# Patient Record
Sex: Female | Born: 1935 | Race: White | Hispanic: No | Smoking: Former smoker
Health system: Southern US, Community
[De-identification: ages and names within clinical notes are randomized; demographics above are authoritative.]

## PROBLEM LIST (undated history)

## (undated) DIAGNOSIS — E785 Hyperlipidemia, unspecified: Secondary | ICD-10-CM

## (undated) DIAGNOSIS — Z972 Presence of dental prosthetic device (complete) (partial): Secondary | ICD-10-CM

## (undated) DIAGNOSIS — I2721 Secondary pulmonary arterial hypertension: Secondary | ICD-10-CM

## (undated) DIAGNOSIS — E079 Disorder of thyroid, unspecified: Secondary | ICD-10-CM

## (undated) DIAGNOSIS — Z974 Presence of external hearing-aid: Secondary | ICD-10-CM

## (undated) DIAGNOSIS — I10 Essential (primary) hypertension: Secondary | ICD-10-CM

## (undated) DIAGNOSIS — K219 Gastro-esophageal reflux disease without esophagitis: Secondary | ICD-10-CM

## (undated) DIAGNOSIS — I5181 Takotsubo syndrome: Secondary | ICD-10-CM

## (undated) DIAGNOSIS — J449 Chronic obstructive pulmonary disease, unspecified: Secondary | ICD-10-CM

## (undated) DIAGNOSIS — K5792 Diverticulitis of intestine, part unspecified, without perforation or abscess without bleeding: Secondary | ICD-10-CM

## (undated) DIAGNOSIS — I509 Heart failure, unspecified: Secondary | ICD-10-CM

## (undated) HISTORY — PX: CERVICAL CONE BIOPSY: SUR198

## (undated) HISTORY — DX: Gastro-esophageal reflux disease without esophagitis: K21.9

## (undated) HISTORY — PX: CHOLECYSTECTOMY: SHX55

## (undated) HISTORY — DX: Secondary pulmonary arterial hypertension: I27.21

## (undated) HISTORY — DX: Disorder of thyroid, unspecified: E07.9

## (undated) HISTORY — DX: Takotsubo syndrome: I51.81

## (undated) HISTORY — PX: HEMORROIDECTOMY: SUR656

## (undated) HISTORY — DX: Diverticulitis of intestine, part unspecified, without perforation or abscess without bleeding: K57.92

## (undated) HISTORY — DX: Essential (primary) hypertension: I10

---

## 2011-02-11 LAB — HM COLONOSCOPY

## 2011-05-19 DIAGNOSIS — M549 Dorsalgia, unspecified: Secondary | ICD-10-CM | POA: Diagnosis not present

## 2011-05-19 DIAGNOSIS — R946 Abnormal results of thyroid function studies: Secondary | ICD-10-CM | POA: Diagnosis not present

## 2011-05-19 DIAGNOSIS — B0089 Other herpesviral infection: Secondary | ICD-10-CM | POA: Diagnosis not present

## 2011-05-19 DIAGNOSIS — R7301 Impaired fasting glucose: Secondary | ICD-10-CM | POA: Diagnosis not present

## 2011-05-19 DIAGNOSIS — Z6826 Body mass index (BMI) 26.0-26.9, adult: Secondary | ICD-10-CM | POA: Diagnosis not present

## 2011-11-09 DIAGNOSIS — R7301 Impaired fasting glucose: Secondary | ICD-10-CM | POA: Diagnosis not present

## 2011-11-09 DIAGNOSIS — E782 Mixed hyperlipidemia: Secondary | ICD-10-CM | POA: Diagnosis not present

## 2011-11-09 DIAGNOSIS — Z6825 Body mass index (BMI) 25.0-25.9, adult: Secondary | ICD-10-CM | POA: Diagnosis not present

## 2011-11-09 DIAGNOSIS — R030 Elevated blood-pressure reading, without diagnosis of hypertension: Secondary | ICD-10-CM | POA: Diagnosis not present

## 2011-11-23 DIAGNOSIS — H2510 Age-related nuclear cataract, unspecified eye: Secondary | ICD-10-CM | POA: Diagnosis not present

## 2011-11-23 DIAGNOSIS — H526 Other disorders of refraction: Secondary | ICD-10-CM | POA: Diagnosis not present

## 2011-11-23 DIAGNOSIS — L2089 Other atopic dermatitis: Secondary | ICD-10-CM | POA: Diagnosis not present

## 2011-12-31 DIAGNOSIS — Z6825 Body mass index (BMI) 25.0-25.9, adult: Secondary | ICD-10-CM | POA: Diagnosis not present

## 2011-12-31 DIAGNOSIS — R05 Cough: Secondary | ICD-10-CM | POA: Diagnosis not present

## 2011-12-31 DIAGNOSIS — J0100 Acute maxillary sinusitis, unspecified: Secondary | ICD-10-CM | POA: Diagnosis not present

## 2012-02-08 DIAGNOSIS — E782 Mixed hyperlipidemia: Secondary | ICD-10-CM | POA: Diagnosis not present

## 2012-02-08 DIAGNOSIS — Z79899 Other long term (current) drug therapy: Secondary | ICD-10-CM | POA: Diagnosis not present

## 2012-02-08 DIAGNOSIS — M25569 Pain in unspecified knee: Secondary | ICD-10-CM | POA: Diagnosis not present

## 2012-02-08 DIAGNOSIS — R7301 Impaired fasting glucose: Secondary | ICD-10-CM | POA: Diagnosis not present

## 2012-02-08 DIAGNOSIS — R946 Abnormal results of thyroid function studies: Secondary | ICD-10-CM | POA: Diagnosis not present

## 2012-02-08 DIAGNOSIS — Z23 Encounter for immunization: Secondary | ICD-10-CM | POA: Diagnosis not present

## 2012-02-08 DIAGNOSIS — Z6826 Body mass index (BMI) 26.0-26.9, adult: Secondary | ICD-10-CM | POA: Diagnosis not present

## 2012-05-31 DIAGNOSIS — R7301 Impaired fasting glucose: Secondary | ICD-10-CM | POA: Diagnosis not present

## 2012-05-31 DIAGNOSIS — K219 Gastro-esophageal reflux disease without esophagitis: Secondary | ICD-10-CM | POA: Diagnosis not present

## 2012-06-19 DIAGNOSIS — I4892 Unspecified atrial flutter: Secondary | ICD-10-CM | POA: Diagnosis not present

## 2012-08-30 DIAGNOSIS — E782 Mixed hyperlipidemia: Secondary | ICD-10-CM | POA: Diagnosis not present

## 2012-08-30 DIAGNOSIS — H04129 Dry eye syndrome of unspecified lacrimal gland: Secondary | ICD-10-CM | POA: Diagnosis not present

## 2012-08-30 DIAGNOSIS — IMO0002 Reserved for concepts with insufficient information to code with codable children: Secondary | ICD-10-CM | POA: Diagnosis not present

## 2012-08-30 DIAGNOSIS — R7301 Impaired fasting glucose: Secondary | ICD-10-CM | POA: Diagnosis not present

## 2012-08-30 DIAGNOSIS — Z79899 Other long term (current) drug therapy: Secondary | ICD-10-CM | POA: Diagnosis not present

## 2012-08-30 DIAGNOSIS — H526 Other disorders of refraction: Secondary | ICD-10-CM | POA: Diagnosis not present

## 2012-08-30 DIAGNOSIS — Z0000 Encounter for general adult medical examination without abnormal findings: Secondary | ICD-10-CM | POA: Diagnosis not present

## 2012-08-30 DIAGNOSIS — M810 Age-related osteoporosis without current pathological fracture: Secondary | ICD-10-CM | POA: Diagnosis not present

## 2012-08-30 DIAGNOSIS — H2510 Age-related nuclear cataract, unspecified eye: Secondary | ICD-10-CM | POA: Diagnosis not present

## 2012-11-08 DIAGNOSIS — IMO0001 Reserved for inherently not codable concepts without codable children: Secondary | ICD-10-CM | POA: Diagnosis not present

## 2013-02-26 ENCOUNTER — Ambulatory Visit: Payer: Self-pay | Admitting: Family Medicine

## 2013-02-26 DIAGNOSIS — M1710 Unilateral primary osteoarthritis, unspecified knee: Secondary | ICD-10-CM | POA: Diagnosis not present

## 2013-02-26 DIAGNOSIS — E7800 Pure hypercholesterolemia, unspecified: Secondary | ICD-10-CM | POA: Diagnosis not present

## 2013-02-26 DIAGNOSIS — Z1331 Encounter for screening for depression: Secondary | ICD-10-CM | POA: Diagnosis not present

## 2013-02-26 DIAGNOSIS — Z23 Encounter for immunization: Secondary | ICD-10-CM | POA: Diagnosis not present

## 2013-02-26 DIAGNOSIS — G589 Mononeuropathy, unspecified: Secondary | ICD-10-CM | POA: Diagnosis not present

## 2013-02-26 DIAGNOSIS — Z1330 Encounter for screening examination for mental health and behavioral disorders, unspecified: Secondary | ICD-10-CM | POA: Diagnosis not present

## 2013-02-26 DIAGNOSIS — M2010 Hallux valgus (acquired), unspecified foot: Secondary | ICD-10-CM | POA: Diagnosis not present

## 2013-02-26 DIAGNOSIS — M19079 Primary osteoarthritis, unspecified ankle and foot: Secondary | ICD-10-CM | POA: Diagnosis not present

## 2013-02-26 DIAGNOSIS — R7309 Other abnormal glucose: Secondary | ICD-10-CM | POA: Diagnosis not present

## 2013-02-26 DIAGNOSIS — E559 Vitamin D deficiency, unspecified: Secondary | ICD-10-CM | POA: Diagnosis not present

## 2013-02-26 DIAGNOSIS — M79609 Pain in unspecified limb: Secondary | ICD-10-CM | POA: Diagnosis not present

## 2013-04-11 DIAGNOSIS — K5792 Diverticulitis of intestine, part unspecified, without perforation or abscess without bleeding: Secondary | ICD-10-CM

## 2013-04-11 HISTORY — DX: Diverticulitis of intestine, part unspecified, without perforation or abscess without bleeding: K57.92

## 2013-04-25 DIAGNOSIS — S239XXA Sprain of unspecified parts of thorax, initial encounter: Secondary | ICD-10-CM | POA: Diagnosis not present

## 2013-04-25 DIAGNOSIS — Z23 Encounter for immunization: Secondary | ICD-10-CM | POA: Diagnosis not present

## 2013-04-25 DIAGNOSIS — Z1331 Encounter for screening for depression: Secondary | ICD-10-CM | POA: Diagnosis not present

## 2013-04-25 DIAGNOSIS — Z1342 Encounter for screening for global developmental delays (milestones): Secondary | ICD-10-CM | POA: Diagnosis not present

## 2013-04-25 DIAGNOSIS — E7800 Pure hypercholesterolemia, unspecified: Secondary | ICD-10-CM | POA: Diagnosis not present

## 2013-04-25 DIAGNOSIS — R7309 Other abnormal glucose: Secondary | ICD-10-CM | POA: Diagnosis not present

## 2013-04-25 DIAGNOSIS — E559 Vitamin D deficiency, unspecified: Secondary | ICD-10-CM | POA: Diagnosis not present

## 2013-04-25 DIAGNOSIS — Z1330 Encounter for screening examination for mental health and behavioral disorders, unspecified: Secondary | ICD-10-CM | POA: Diagnosis not present

## 2013-06-24 DIAGNOSIS — Z1342 Encounter for screening for global developmental delays (milestones): Secondary | ICD-10-CM | POA: Diagnosis not present

## 2013-06-24 DIAGNOSIS — R5383 Other fatigue: Secondary | ICD-10-CM | POA: Diagnosis not present

## 2013-06-24 DIAGNOSIS — E559 Vitamin D deficiency, unspecified: Secondary | ICD-10-CM | POA: Diagnosis not present

## 2013-06-24 DIAGNOSIS — Z23 Encounter for immunization: Secondary | ICD-10-CM | POA: Diagnosis not present

## 2013-06-24 DIAGNOSIS — Z1330 Encounter for screening examination for mental health and behavioral disorders, unspecified: Secondary | ICD-10-CM | POA: Diagnosis not present

## 2013-06-24 DIAGNOSIS — R7309 Other abnormal glucose: Secondary | ICD-10-CM | POA: Diagnosis not present

## 2013-06-24 DIAGNOSIS — Z1331 Encounter for screening for depression: Secondary | ICD-10-CM | POA: Diagnosis not present

## 2013-06-24 DIAGNOSIS — R5381 Other malaise: Secondary | ICD-10-CM | POA: Diagnosis not present

## 2013-06-24 DIAGNOSIS — E7800 Pure hypercholesterolemia, unspecified: Secondary | ICD-10-CM | POA: Diagnosis not present

## 2013-06-24 LAB — TSH: TSH: 3.35 u[IU]/mL (ref <=5.90)

## 2013-07-01 DIAGNOSIS — H2510 Age-related nuclear cataract, unspecified eye: Secondary | ICD-10-CM | POA: Diagnosis not present

## 2013-10-21 DIAGNOSIS — R7309 Other abnormal glucose: Secondary | ICD-10-CM | POA: Diagnosis not present

## 2013-10-21 DIAGNOSIS — Z1342 Encounter for screening for global developmental delays (milestones): Secondary | ICD-10-CM | POA: Diagnosis not present

## 2013-10-21 DIAGNOSIS — Z23 Encounter for immunization: Secondary | ICD-10-CM | POA: Diagnosis not present

## 2013-10-21 DIAGNOSIS — E7800 Pure hypercholesterolemia, unspecified: Secondary | ICD-10-CM | POA: Diagnosis not present

## 2013-10-21 DIAGNOSIS — Z1330 Encounter for screening examination for mental health and behavioral disorders, unspecified: Secondary | ICD-10-CM | POA: Diagnosis not present

## 2013-10-21 DIAGNOSIS — G589 Mononeuropathy, unspecified: Secondary | ICD-10-CM | POA: Diagnosis not present

## 2013-10-21 DIAGNOSIS — K2100 Gastro-esophageal reflux disease with esophagitis, without bleeding: Secondary | ICD-10-CM | POA: Diagnosis not present

## 2013-10-21 DIAGNOSIS — Z1331 Encounter for screening for depression: Secondary | ICD-10-CM | POA: Diagnosis not present

## 2013-10-21 DIAGNOSIS — M129 Arthropathy, unspecified: Secondary | ICD-10-CM | POA: Diagnosis not present

## 2013-10-21 LAB — LIPID PANEL
Cholesterol: 181 mg/dL (ref 0–200)
HDL: 96 mg/dL — AB (ref 35–70)
LDL CALC: 68 mg/dL
TRIGLYCERIDES: 87 mg/dL (ref 40–160)

## 2013-10-21 LAB — HEPATIC FUNCTION PANEL
ALT: 25 U/L (ref 7–35)
AST: 28 U/L (ref 13–35)
Alkaline Phosphatase: 69 U/L (ref 25–125)
Bilirubin, Total: 0.7 mg/dL

## 2013-10-21 LAB — HEMOGLOBIN A1C: HEMOGLOBIN A1C: 5.9 % (ref 4.0–6.0)

## 2013-10-21 LAB — BASIC METABOLIC PANEL
BUN: 8 mg/dL (ref 4–21)
Creatinine: 0.6 mg/dL (ref 0.5–1.1)
Glucose: 113 mg/dL
Potassium: 5.1 mmol/L (ref 3.4–5.3)
SODIUM: 141 mmol/L (ref 137–147)

## 2013-11-13 ENCOUNTER — Ambulatory Visit (INDEPENDENT_AMBULATORY_CARE_PROVIDER_SITE_OTHER): Payer: Medicare Other

## 2013-11-13 ENCOUNTER — Ambulatory Visit (INDEPENDENT_AMBULATORY_CARE_PROVIDER_SITE_OTHER): Payer: Medicare Other | Admitting: Podiatry

## 2013-11-13 ENCOUNTER — Encounter: Payer: Self-pay | Admitting: Podiatry

## 2013-11-13 VITALS — BP 133/72 | HR 84 | Resp 16 | Ht 64.0 in | Wt 135.0 lb

## 2013-11-13 DIAGNOSIS — M21611 Bunion of right foot: Secondary | ICD-10-CM

## 2013-11-13 DIAGNOSIS — M21619 Bunion of unspecified foot: Secondary | ICD-10-CM

## 2013-11-13 DIAGNOSIS — M19079 Primary osteoarthritis, unspecified ankle and foot: Secondary | ICD-10-CM

## 2013-11-13 NOTE — Progress Notes (Signed)
   Subjective:    Patient ID: Mary Howe, female    DOB: Aug 30, 1932, 80 y.o.   MRN: [id number]  HPI Comments: i have foot pain in both of my feet in the arches. They have been hurting for a couple of years off and on. They are getting worse at times. It hurts to walk. i wake up at night and they hurt. i have gotten new shoes and otc inserts. i take motrin as well.  Foot Pain      Review of Systems  HENT: Positive for hearing loss.   Musculoskeletal:       Joint pain  Hematological: Bruises/bleeds easily.  All other systems reviewed and are negative.      Objective:   Physical Exam: I have reviewed her past medical history medications allergies surgeries social history and review of systems. Pulses are strongly palpable bilateral. Neurologic sensorium is intact bilateral. Deep tendon reflexes are brisk and equal bilateral. Muscle strength is 5 over 5 dorsiflexors plantar flexors inverters everters all musculature is intact. Orthopedic evaluation should all joints distal to the ankle a full range of motion without crepitation. 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 They're asymptomatic. She has pain on palpation and frontal plane range of motion of the midfoot in the arch. Radiographic evaluation demonstrates severe hallux valgus deformities bilateral hammertoe deformities bilateral but also osteoarthritis of the midfoot. With osteopenia.        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 HAV deformities bilateral.  Plan: Discussed etiology pathology conservative or surgical therapies. We discussed appropriate inserts and appropriate shoes. I will followup with her as needed. We also discussed the need for meloxicam.

## 2014-01-14 DIAGNOSIS — K5792 Diverticulitis of intestine, part unspecified, without perforation or abscess without bleeding: Secondary | ICD-10-CM | POA: Diagnosis not present

## 2014-01-14 DIAGNOSIS — Z6823 Body mass index (BMI) 23.0-23.9, adult: Secondary | ICD-10-CM | POA: Diagnosis not present

## 2014-01-14 DIAGNOSIS — R1030 Lower abdominal pain, unspecified: Secondary | ICD-10-CM | POA: Diagnosis not present

## 2014-01-14 DIAGNOSIS — R8299 Other abnormal findings in urine: Secondary | ICD-10-CM | POA: Diagnosis not present

## 2014-01-14 DIAGNOSIS — E782 Mixed hyperlipidemia: Secondary | ICD-10-CM | POA: Diagnosis not present

## 2014-01-14 DIAGNOSIS — G8929 Other chronic pain: Secondary | ICD-10-CM | POA: Diagnosis not present

## 2014-01-14 DIAGNOSIS — M25561 Pain in right knee: Secondary | ICD-10-CM | POA: Diagnosis not present

## 2014-01-14 DIAGNOSIS — M542 Cervicalgia: Secondary | ICD-10-CM | POA: Diagnosis not present

## 2014-01-14 LAB — LIPID PANEL
Cholesterol: 165 mg/dL (ref 0–200)
HDL: 92 mg/dL — AB (ref 35–70)
LDL CALC: 59 mg/dL
Triglycerides: 72 mg/dL (ref 40–160)

## 2014-01-14 LAB — BASIC METABOLIC PANEL
BUN: 7 mg/dL (ref 4–21)
Creatinine: 0.5 mg/dL (ref 0.5–1.1)
GLUCOSE: 102 mg/dL
POTASSIUM: 4.3 mmol/L (ref 3.4–5.3)
SODIUM: 140 mmol/L (ref 137–147)

## 2014-01-14 LAB — HEPATIC FUNCTION PANEL
ALT: 20 U/L (ref 7–35)
AST: 23 U/L (ref 13–35)
Alkaline Phosphatase: 83 U/L (ref 25–125)
BILIRUBIN, TOTAL: 0.7 mg/dL

## 2014-01-14 LAB — TSH: TSH: 2.93 u[IU]/mL (ref 0.41–5.90)

## 2014-01-20 DIAGNOSIS — K5900 Constipation, unspecified: Secondary | ICD-10-CM | POA: Diagnosis not present

## 2014-01-20 DIAGNOSIS — K5730 Diverticulosis of large intestine without perforation or abscess without bleeding: Secondary | ICD-10-CM | POA: Diagnosis not present

## 2014-01-20 DIAGNOSIS — Z6823 Body mass index (BMI) 23.0-23.9, adult: Secondary | ICD-10-CM | POA: Diagnosis not present

## 2014-01-20 DIAGNOSIS — K5792 Diverticulitis of intestine, part unspecified, without perforation or abscess without bleeding: Secondary | ICD-10-CM | POA: Diagnosis not present

## 2014-01-20 DIAGNOSIS — R1030 Lower abdominal pain, unspecified: Secondary | ICD-10-CM | POA: Diagnosis not present

## 2014-01-20 DIAGNOSIS — Z23 Encounter for immunization: Secondary | ICD-10-CM | POA: Diagnosis not present

## 2014-06-02 DIAGNOSIS — H6692 Otitis media, unspecified, left ear: Secondary | ICD-10-CM | POA: Diagnosis not present

## 2014-06-02 DIAGNOSIS — R112 Nausea with vomiting, unspecified: Secondary | ICD-10-CM | POA: Diagnosis not present

## 2014-06-16 DIAGNOSIS — H6983 Other specified disorders of Eustachian tube, bilateral: Secondary | ICD-10-CM | POA: Diagnosis not present

## 2014-06-16 DIAGNOSIS — H9193 Unspecified hearing loss, bilateral: Secondary | ICD-10-CM | POA: Diagnosis not present

## 2014-06-16 DIAGNOSIS — M2669 Other specified disorders of temporomandibular joint: Secondary | ICD-10-CM | POA: Diagnosis not present

## 2014-07-29 DIAGNOSIS — M25732 Osteophyte, left wrist: Secondary | ICD-10-CM | POA: Diagnosis not present

## 2014-07-29 DIAGNOSIS — H9193 Unspecified hearing loss, bilateral: Secondary | ICD-10-CM | POA: Diagnosis not present

## 2014-07-29 DIAGNOSIS — H6983 Other specified disorders of Eustachian tube, bilateral: Secondary | ICD-10-CM | POA: Diagnosis not present

## 2014-08-05 ENCOUNTER — Ambulatory Visit
Admit: 2014-08-05 | Disposition: A | Discharge status: Home / Self Care | Payer: Self-pay | Attending: Family Medicine | Admitting: Family Medicine

## 2014-08-05 DIAGNOSIS — M25432 Effusion, left wrist: Secondary | ICD-10-CM | POA: Diagnosis not present

## 2014-08-05 DIAGNOSIS — H6983 Other specified disorders of Eustachian tube, bilateral: Secondary | ICD-10-CM | POA: Diagnosis not present

## 2014-08-05 DIAGNOSIS — H9193 Unspecified hearing loss, bilateral: Secondary | ICD-10-CM | POA: Diagnosis not present

## 2014-08-05 DIAGNOSIS — M19032 Primary osteoarthritis, left wrist: Secondary | ICD-10-CM | POA: Diagnosis not present

## 2014-08-19 DIAGNOSIS — M778 Other enthesopathies, not elsewhere classified: Secondary | ICD-10-CM | POA: Diagnosis not present

## 2014-09-17 DIAGNOSIS — M778 Other enthesopathies, not elsewhere classified: Secondary | ICD-10-CM | POA: Diagnosis not present

## 2014-10-01 DIAGNOSIS — H903 Sensorineural hearing loss, bilateral: Secondary | ICD-10-CM | POA: Diagnosis not present

## 2014-10-01 DIAGNOSIS — H60332 Swimmer's ear, left ear: Secondary | ICD-10-CM | POA: Diagnosis not present

## 2014-10-01 DIAGNOSIS — H60549 Acute eczematoid otitis externa, unspecified ear: Secondary | ICD-10-CM | POA: Diagnosis not present

## 2014-10-01 DIAGNOSIS — H6063 Unspecified chronic otitis externa, bilateral: Secondary | ICD-10-CM | POA: Diagnosis not present

## 2014-10-01 DIAGNOSIS — H6123 Impacted cerumen, bilateral: Secondary | ICD-10-CM | POA: Diagnosis not present

## 2014-10-15 DIAGNOSIS — H6122 Impacted cerumen, left ear: Secondary | ICD-10-CM | POA: Diagnosis not present

## 2014-10-15 DIAGNOSIS — H60392 Other infective otitis externa, left ear: Secondary | ICD-10-CM | POA: Diagnosis not present

## 2014-10-30 DIAGNOSIS — H903 Sensorineural hearing loss, bilateral: Secondary | ICD-10-CM | POA: Diagnosis not present

## 2014-11-06 DIAGNOSIS — H2513 Age-related nuclear cataract, bilateral: Secondary | ICD-10-CM | POA: Diagnosis not present

## 2014-12-04 ENCOUNTER — Other Ambulatory Visit: Payer: Self-pay | Admitting: Family Medicine

## 2014-12-04 DIAGNOSIS — Z1231 Encounter for screening mammogram for malignant neoplasm of breast: Secondary | ICD-10-CM

## 2014-12-09 ENCOUNTER — Other Ambulatory Visit: Payer: Self-pay | Admitting: Family Medicine

## 2014-12-09 ENCOUNTER — Ambulatory Visit
Admission: RE | Admit: 2014-12-09 | Discharge: 2014-12-09 | Disposition: A | Discharge status: Home / Self Care | Payer: Medicare Other | Source: Ambulatory Visit | Attending: Family Medicine | Admitting: Family Medicine

## 2014-12-09 DIAGNOSIS — Z1231 Encounter for screening mammogram for malignant neoplasm of breast: Secondary | ICD-10-CM | POA: Diagnosis not present

## 2015-01-27 ENCOUNTER — Other Ambulatory Visit: Payer: Self-pay | Admitting: Family Medicine

## 2015-02-19 DIAGNOSIS — E7800 Pure hypercholesterolemia, unspecified: Secondary | ICD-10-CM | POA: Insufficient documentation

## 2015-02-19 DIAGNOSIS — R7303 Prediabetes: Secondary | ICD-10-CM | POA: Insufficient documentation

## 2015-02-19 DIAGNOSIS — E0500 Thyrotoxicosis with diffuse goiter without thyrotoxic crisis or storm: Secondary | ICD-10-CM | POA: Insufficient documentation

## 2015-02-19 DIAGNOSIS — M26629 Arthralgia of temporomandibular joint, unspecified side: Secondary | ICD-10-CM | POA: Insufficient documentation

## 2015-02-19 DIAGNOSIS — K219 Gastro-esophageal reflux disease without esophagitis: Secondary | ICD-10-CM | POA: Insufficient documentation

## 2015-02-19 DIAGNOSIS — I878 Other specified disorders of veins: Secondary | ICD-10-CM | POA: Insufficient documentation

## 2015-02-19 DIAGNOSIS — G629 Polyneuropathy, unspecified: Secondary | ICD-10-CM | POA: Insufficient documentation

## 2015-02-19 DIAGNOSIS — I8390 Asymptomatic varicose veins of unspecified lower extremity: Secondary | ICD-10-CM | POA: Insufficient documentation

## 2015-02-19 DIAGNOSIS — K5790 Diverticulosis of intestine, part unspecified, without perforation or abscess without bleeding: Secondary | ICD-10-CM | POA: Insufficient documentation

## 2015-02-19 DIAGNOSIS — E559 Vitamin D deficiency, unspecified: Secondary | ICD-10-CM | POA: Insufficient documentation

## 2015-02-23 ENCOUNTER — Encounter: Payer: Self-pay | Admitting: Family Medicine

## 2015-02-23 ENCOUNTER — Ambulatory Visit (INDEPENDENT_AMBULATORY_CARE_PROVIDER_SITE_OTHER): Payer: Medicare Other | Admitting: Family Medicine

## 2015-02-23 VITALS — BP 162/78 | HR 56 | Temp 97.6°F | Resp 16 | Ht 64.5 in | Wt 137.0 lb

## 2015-02-23 DIAGNOSIS — IMO0001 Reserved for inherently not codable concepts without codable children: Secondary | ICD-10-CM

## 2015-02-23 DIAGNOSIS — Z23 Encounter for immunization: Secondary | ICD-10-CM

## 2015-02-23 DIAGNOSIS — Z0000 Encounter for general adult medical examination without abnormal findings: Secondary | ICD-10-CM | POA: Diagnosis not present

## 2015-02-23 DIAGNOSIS — R030 Elevated blood-pressure reading, without diagnosis of hypertension: Secondary | ICD-10-CM

## 2015-02-23 LAB — POCT URINALYSIS DIPSTICK
Bilirubin, UA: NEGATIVE
Blood, UA: NEGATIVE
GLUCOSE UA: NEGATIVE
KETONES UA: NEGATIVE
Leukocytes, UA: NEGATIVE
Nitrite, UA: NEGATIVE
PH UA: 6.5
Protein, UA: NEGATIVE
Spec Grav, UA: <=1.005
Urobilinogen, UA: 0.2

## 2015-02-23 NOTE — Progress Notes (Signed)
Patient ID: Juliett Stjohn, female   DOB: 1932-12-22, 81 y.o.   MRN: [id number]       Patient: Mary Howe, Female    DOB: November 18, 1932, 81 y.o.   MRN: [id number] Visit Date: 02/23/2015  Today's Provider: Vernie Murders, PA   Chief Complaint  Patient presents with  . Annual Exam   Subjective:    Annual physical exam Mary Howe is a 81 y.o. female who presents today for health maintenance and complete physical. She feels well. She reports exercising 4 times a week. She reports she is sleeping well (7-8 hours a night).  -----------------------------------------------------------------   Review of Systems  Constitutional: Negative.   HENT: Negative.   Eyes: Negative.   Respiratory: Negative.   Cardiovascular: Negative.   Gastrointestinal: Negative.   Endocrine: Negative.   Genitourinary: Negative.   Musculoskeletal: Negative.   Skin: Negative.   Allergic/Immunologic: Negative.   Neurological: Negative.   Hematological: Negative.   Psychiatric/Behavioral: Negative.     Social History She  reports that she has quit smoking. She has never used smokeless tobacco. She reports that she drinks alcohol. She reports that she does not use illicit drugs. Social History   Social History  . Marital Status: Divorced    Spouse Name: N/A  . Number of Children: N/A  . Years of Education: N/A   Social History Main Topics  . Smoking status: Former Research scientist (life sciences)  . Smokeless tobacco: Never Used  . Alcohol Use: 0.0 oz/week    0 Standard drinks or equivalent per week     Comment: OCCASIONALLY DRINKS WINE  . Drug Use: No  . Sexual Activity: Not Asked   Other Topics Concern  . None   Social History Narrative   ** Merged History Encounter **        Patient Active Problem List   Diagnosis Date Noted  . DD (diverticular disease) 02/19/2015  . Acid reflux 02/19/2015  . 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 Hypercholesterolemia 02/19/2015  . Neuropathy ([place]) 02/19/2015  . 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 Stasis, venous 02/19/2015  . Avitaminosis D 02/19/2015    Past Surgical History  Procedure Laterality Date  . Cholecystectomy    . Hemorroidectomy    . Cervical cone biopsy      Family History  Family Status  Relation Status Death Age  . Mother Deceased 59  . Father Deceased 56  . Sister    . Brother Deceased 26  . Brother Deceased 15'S  . Brother Deceased   . Brother Deceased   . Brother Deceased 77  . Sister Deceased 73  . Sister Deceased 41  . Sister Alive    Her family history includes Aneurysm in her sister; Bone cancer in her sister; Brain cancer in her brother; CAD in her brother and brother; Emphysema in her sister; Fibrocystic breast disease in her sister; Heart attack in her brother; Heart disease in her brother and sister; Lung disease in her brother; Pneumonia in her brother; Prostate cancer in her father.    No Known Allergies  Previous Medications   CALCIUM CARBONATE (OS-CAL) 600 MG TABS TABLET    Take by mouth.   IBUPROFEN (ADVIL,MOTRIN) 800 MG TABLET    TAKE ONE TABLET BY MOUTH TWICE DAILY AS NEEDED   MELOXICAM (MOBIC) 7.5 MG TABLET    Take 7.5 mg by mouth as needed for pain.   MULTIPLE VITAMIN PO    Take by mouth.  PANTOPRAZOLE (PROTONIX) 40 MG TABLET    TAKE ONE TABLET BY MOUTH ONE TIME DAILY   SIMVASTATIN (ZOCOR) 40 MG TABLET    TAKE ONE TABLET BY MOUTH NIGHTLY AT BEDTIME    Patient Care Team: Jerrol Banana., MD as PCP - General (Family Medicine)     Objective:   Vitals: BP 162/78 mmHg  Pulse 56  Temp(Src) 97.6 F (36.4 C) (Oral)  Resp 16  Ht 5' 4.5" (1.638 m)  Wt 137 lb (62.143 kg)  BMI 23.16 kg/m2  SpO2 93%  BP Readings from Last 3 Encounters:  02/23/15 162/78  08/05/14 120/74  11/13/13 133/72    Physical Exam  Constitutional: She is oriented to person, place, and time. She appears well-developed and  well-nourished.  HENT:  Head: Normocephalic and atraumatic.  Hearing loss compensated by hearing aids bilaterally.  Eyes: Conjunctivae and EOM are normal. Pupils are equal, round, and reactive to light.  Neck: Normal range of motion. Neck supple.  Cardiovascular: Normal rate, regular rhythm, normal heart sounds and intact distal pulses.   Many varicose veins in lower legs and feet.  Pulmonary/Chest: Effort normal and breath sounds normal.  Abdominal: Soft. Bowel sounds are normal.  Musculoskeletal:  Good ROM throughout. Enlarged first carpo-metacarpal joint of each hand (right worse than the left). Good grip strength. Lateral deviation of both great toes with bunions.  Neurological: She is alert and oriented to person, place, and time.  2+ DTR's in upper extremities and left knee. Unable to elicit in the right knee ("never have").  Skin: Skin is warm and dry. No rash noted.  Psychiatric: She has a normal mood and affect. Her behavior is normal. Thought content normal.   Depression Screen Bright spirits without anxiety or depressive symptoms. PHQ-2 score 0.   Assessment & Plan:     Routine Health Maintenance and Physical Exam  Exercise Activities and Dietary recommendations Goals    Continues water aerobics twice a week and walking 4 times a week. Recommend restricting sodium and caffeine intake.      Immunization History  Administered Date(s) Administered  . Pneumococcal Conjugate-13 10/21/2013    Health Maintenance  Topic Date Due  . TETANUS/TDAP  09/08/1953  . ZOSTAVAX  09/09/1994  . DEXA SCAN  09/09/1999  . PNA vac Low Risk Adult (2 of 2 - PPSV23) 10/22/2014  . INFLUENZA VACCINE  11/10/2014      Discussed health benefits of physical activity, and encouraged her to engage in regular exercise appropriate for her age and condition.    -------------------------------------------------------------------- 1. Annual physical exam General health stable with bright  spirits. Exercises regularly in water aerobics. Very active. Has some varicose veins, bunions both feet and arthritic joints of both wrists. Consumes 1-2 glasses of wine 2-3 times a week. No balance issues and no history of falls in the past year. Wears bilateral hearing aids and able to converse with complete understanding at a normal tone. Tolerating medications for GERD (Protonix) and hyperlipidemia (Simvastatin) without side effects. Brought lab reports from 01-14-14 done in [place]. All tests were normal and very good lipid panel. Will schedule appointment for follow up of these conditions separate form this Medicare Health Screening. Unsure about last tetanus booster but found out it is not covered by her insurance today and declines vaccine. Had PCV-223  In [place] at age 27 (per patient) before coming to [facility]. PCV-13 was given 10-21-13 and last colonoscopy was done 2012 with history of diverticulosis. Mammograms were normal 12-09-14. Orthopedist  is Dr. Noemi Chapel and podiatrist is Dr. Milinda Pointer. - POCT urinalysis dipstick  2. Need for influenza vaccination Given today. - Flu vaccine HIGH DOSE PF  3. Elevated BP Increase in systolic pressure today. No history of hypertension in the past. Recommend she limit sodium intake and recheck BP in a month.

## 2015-02-26 ENCOUNTER — Ambulatory Visit (INDEPENDENT_AMBULATORY_CARE_PROVIDER_SITE_OTHER): Payer: Medicare Other | Admitting: Family Medicine

## 2015-02-26 ENCOUNTER — Encounter: Payer: Self-pay | Admitting: Family Medicine

## 2015-02-26 VITALS — BP 138/74 | HR 113 | Temp 99.8°F | Resp 18 | Wt 136.2 lb

## 2015-02-26 DIAGNOSIS — J40 Bronchitis, not specified as acute or chronic: Secondary | ICD-10-CM | POA: Diagnosis not present

## 2015-02-26 DIAGNOSIS — J029 Acute pharyngitis, unspecified: Secondary | ICD-10-CM

## 2015-02-26 MED ORDER — BENZONATATE 200 MG PO CAPS: 200.0000 mg | ORAL_CAPSULE | Freq: Two times a day (BID) | ORAL | Status: DC | PRN

## 2015-02-26 MED ORDER — AMOXICILLIN 875 MG PO TABS: 875.0000 mg | ORAL_TABLET | Freq: Two times a day (BID) | ORAL | Status: DC

## 2015-02-26 MED ORDER — FIRST-DUKES MOUTHWASH MT SUSP: 5.0000 mL | Freq: Three times a day (TID) | OROMUCOSAL | Status: DC

## 2015-02-26 NOTE — Progress Notes (Signed)
Patient ID: Aresha Remer, female   DOB: [birth year], 81 y.o.   MRN: [id number] Name: Mary Howe   MRN: [id number]    DOB: [birth year]   Date:02/26/2015       Progress Note  Subjective  Chief Complaint  Chief Complaint  Patient presents with  . URI    URI  This is a new problem. Episode onset: the past 3 days. The problem has been gradually worsening. Associated symptoms include congestion, coughing, headaches and a sore throat. Pertinent negatives include no ear pain. Associated symptoms comments: Sore throat worse this morning. Low grade temperature at home. Worried about having bronchitis again - "usually occurs after a cold starts".. Treatments tried: guaifenesin. The treatment provided no relief.   Patient Active Problem List   Diagnosis Date Noted  . DD (diverticular disease) 02/19/2015  . Acid reflux 02/19/2015  . 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 Hypercholesterolemia 02/19/2015  . Neuropathy ([place]) 02/19/2015  . Chemical diabetes ([place]) 02/19/2015  . 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 Stasis, venous 02/19/2015  . Avitaminosis D 02/19/2015   Past Surgical History  Procedure Laterality Date  . Cholecystectomy    . Hemorroidectomy    . Cervical cone biopsy     Family History  Problem Relation Age of Onset  . Prostate cancer Father   . Pneumonia Brother   . Lung disease Brother   . CAD Brother   . Heart disease Brother   . CAD Brother   . Heart attack Brother   . Brain cancer Brother   . Bone cancer Sister   . Emphysema Sister   . Fibrocystic breast disease Sister   . Aneurysm Sister   . Heart disease Sister    Social History  Substance Use Topics  . Smoking status: Former Research scientist (life sciences)  . Smokeless tobacco: Never Used  . Alcohol Use: 0.0 oz/week    0 Standard drinks or equivalent per week     Comment: OCCASIONALLY DRINKS WINE    Current outpatient prescriptions:  .  calcium carbonate (OS-CAL) 600 MG TABS  tablet, Take by mouth., Disp: , Rfl:  .  ibuprofen (ADVIL,MOTRIN) 800 MG tablet, TAKE ONE TABLET BY MOUTH TWICE DAILY AS NEEDED, Disp: 180 tablet, Rfl: 0 .  meloxicam (MOBIC) 7.5 MG tablet, Take 7.5 mg by mouth as needed for pain., Disp: , Rfl:  .  MULTIPLE VITAMIN PO, Take by mouth., Disp: , Rfl:  .  pantoprazole (PROTONIX) 40 MG tablet, TAKE ONE TABLET BY MOUTH ONE TIME DAILY, Disp: 90 tablet, Rfl: 0 .  simvastatin (ZOCOR) 40 MG tablet, TAKE ONE TABLET BY MOUTH NIGHTLY AT BEDTIME, Disp: 90 tablet, Rfl: 0  No Known Allergies  Review of Systems  Constitutional: Negative.   HENT: Positive for congestion and sore throat. Negative for ear pain.   Eyes: Negative.   Respiratory: Positive for cough.   Cardiovascular: Negative.   Gastrointestinal: Negative.   Genitourinary: Negative.   Musculoskeletal: Negative.   Skin: Negative.   Neurological: Positive for headaches.  Endo/Heme/Allergies: Negative.   Psychiatric/Behavioral: Negative.    Objective  Filed Vitals:   02/26/15 1058  BP: 138/74  Pulse: 113  Temp: 99.8 F (37.7 C)  TempSrc: Oral  Resp: 18  Weight: 136 lb 3.2 oz (61.78 kg)  SpO2: 94%   Physical Exam  Constitutional: She is oriented to person, place, and time and well-developed, well-nourished, and in no distress.  HENT:  Head: Normocephalic and atraumatic.  Hearing loss requiring bilateral hearing aids. TM's  not red and no drainage. Slightly red and cobblestone appearance to posterior pharynx. No sinus tenderness.  Eyes: Conjunctivae and EOM are normal.  Neck: Normal range of motion. Neck supple.  Cardiovascular: Regular rhythm.   Slightly tachycardic.  Pulmonary/Chest: Effort normal and breath sounds normal.  Abdominal: Soft. Bowel sounds are normal.  Lymphadenopathy:    She has no cervical adenopathy.  Neurological: She is alert and oriented to person, place, and time.  Skin: No rash noted.  Psychiatric: Affect and judgment normal.    Recent Results (from  the past 2160 hour(s))  POCT urinalysis dipstick     Status: None   Collection Time: 02/23/15  9:15 AM  Result Value Ref Range   Color, UA pale yellow    Clarity, UA clear    Glucose, UA neg    Bilirubin, UA neg    Ketones, UA neg    Spec Grav, UA <=1.005    Blood, UA neg    pH, UA 6.5    Protein, UA neg    Urobilinogen, UA 0.2    Nitrite, UA neg    Leukocytes, UA Negative Negative   Assessment & Plan 1. Sorethroat Onset of sore throat and cough 3 days ago after getting flu vaccination 5 days ago. Has low grade fever today. Sore throat worse this morning with post nasal drip. Will treat with Duke's Mouthwash and treat for early bronchitis.  - Diphenhyd-Hydrocort-Nystatin (FIRST-DUKES MOUTHWASH) SUSP; Use as directed 5 mLs in the mouth or throat 4 (four) times daily - after meals and at bedtime.  Dispense: 237 mL; Refill: 0  2. Bronchitis Onset over the past 3 days with some purulent sputum. Very worried she "usually get bronchitis after a cold starts". Will get CBC with diff and treat antibiotic, expectorant and cough suppressant. Recheck pending reports. - amoxicillin (AMOXIL) 875 MG tablet; Take 1 tablet (875 mg total) by mouth 2 (two) times daily.  Dispense: 20 tablet; Refill: 0 - benzonatate (TESSALON) 200 MG capsule; Take 1 capsule (200 mg total) by mouth 2 (two) times daily as needed for cough.  Dispense: 20 capsule; Refill: 0 - CBC with Differential/Platelet

## 2015-02-27 LAB — CBC WITH DIFFERENTIAL/PLATELET
BASOS ABS: 0 10*3/uL (ref 0.0–0.2)
Basos: 0 %
EOS (ABSOLUTE): 0 10*3/uL (ref 0.0–0.4)
Eos: 0 %
Hematocrit: 44.2 % (ref 34.0–46.6)
Hemoglobin: 15.4 g/dL (ref 11.1–15.9)
Immature Grans (Abs): 0 10*3/uL (ref 0.0–0.1)
Immature Granulocytes: 0 %
LYMPHS ABS: 1.2 10*3/uL (ref 0.7–3.1)
Lymphs: 19 %
MCH: 31.3 pg (ref 26.6–33.0)
MCHC: 34.8 g/dL (ref 31.5–35.7)
MCV: 90 fL (ref 79–97)
Monocytes Absolute: 0.7 10*3/uL (ref 0.1–0.9)
Monocytes: 12 %
Neutrophils Absolute: 4.1 10*3/uL (ref 1.4–7.0)
Neutrophils: 69 %
Platelets: 220 10*3/uL (ref 150–379)
RBC: 4.92 x10E6/uL (ref 3.77–5.28)
RDW: 13.6 % (ref 12.3–15.4)
WBC: 6.1 10*3/uL (ref 3.4–10.8)

## 2015-03-02 ENCOUNTER — Telehealth: Payer: Self-pay

## 2015-03-02 NOTE — Telephone Encounter (Signed)
LMTCB

## 2015-03-02 NOTE — Telephone Encounter (Signed)
-----   Message from Margo Common, [place] sent at 02/27/2015  6:04 PM EST ----- Normal blood cell counts. No sigh of bacterial infection. If symptoms don't clear in 1 week, should recheck in the office again.

## 2015-03-02 NOTE — Telephone Encounter (Signed)
Patient advised as directed below. Patient verbalized understanding and agrees with plan of care.  

## 2015-03-13 ENCOUNTER — Other Ambulatory Visit: Payer: Self-pay

## 2015-03-13 ENCOUNTER — Telehealth: Payer: Self-pay

## 2015-03-13 MED ORDER — NA SULFATE-K SULFATE-MG SULF 17.5-3.13-1.6 GM/177ML PO SOLN: 1.0000 | ORAL | Status: DC

## 2015-03-13 NOTE — Telephone Encounter (Deleted)
Patient scheduled for Colonoscopy on 03/19/15 at [facility]. Verified that Insurance information in chart is correct. Can you please take care of authorization?

## 2015-03-13 NOTE — Telephone Encounter (Signed)
Gastroenterology Pre-Procedure Review  Request Date: 03/19/15 Requesting Physician: Dr. Allen Norris  PATIENT REVIEW QUESTIONS: The patient responded to the following health history questions as indicated:    1. Are you having any GI issues? no 2. Do you have a personal history of Polyps? no 3. Do you have a family history of Colon Cancer or Polyps? no 4. Diabetes Mellitus? no 5. Joint replacements in the past 12 months?no 6. Major health problems in the past 3 months?no 7. Any artificial heart valves, MVP, or defibrillator?no    MEDICATIONS & ALLERGIES:    Patient reports the following regarding taking any anticoagulation/antiplatelet therapy:   Plavix, Coumadin, Eliquis, Xarelto, Lovenox, Pradaxa, Brilinta, or Effient? no Aspirin? no  Patient confirms/reports the following medications:  Current Outpatient Prescriptions  Medication Sig Dispense Refill  . calcium carbonate (OS-CAL) 600 MG TABS tablet Take by mouth.    Marland Kitchen ibuprofen (ADVIL,MOTRIN) 800 MG tablet TAKE ONE TABLET BY MOUTH TWICE DAILY AS NEEDED 180 tablet 0  . meloxicam (MOBIC) 7.5 MG tablet Take 7.5 mg by mouth as needed for pain.    Marland Kitchen MULTIPLE VITAMIN PO Take by mouth.    . pantoprazole (PROTONIX) 40 MG tablet TAKE ONE TABLET BY MOUTH ONE TIME DAILY 90 tablet 0  . simvastatin (ZOCOR) 40 MG tablet TAKE ONE TABLET BY MOUTH NIGHTLY AT BEDTIME 90 tablet 0   No current facility-administered medications for this visit.    Patient confirms/reports the following allergies:  No Known Allergies  No orders of the defined types were placed in this encounter.    AUTHORIZATION INFORMATION Primary Insurance: Medicare 1D#: Group #:  Secondary Insurance: 1D#: AARP Group #:  SCHEDULE INFORMATION: Date: 03/19/15 Time: Location: [place]

## 2015-03-13 NOTE — Telephone Encounter (Signed)
Patient called back in and would like to cancel procedure scheduled on 12/8 and would like to set-up appointment with Dr. Allen Norris in [place] office.  Appointment set-up, can you please cancel colonoscopy originally scheduled on 12/8?

## 2015-03-16 NOTE — Telephone Encounter (Signed)
Colonoscopy appt on 03/19/15 at [facility] has been cancelled.

## 2015-03-19 ENCOUNTER — Encounter: Admission: RE | Payer: Self-pay | Source: Ambulatory Visit

## 2015-03-19 ENCOUNTER — Ambulatory Visit: Admission: RE | Admit: 2015-03-19 | Payer: Medicare Other | Source: Ambulatory Visit | Admitting: Gastroenterology

## 2015-03-19 SURGERY — COLONOSCOPY WITH PROPOFOL
Anesthesia: Choice

## 2015-03-23 ENCOUNTER — Encounter: Payer: Self-pay | Admitting: Family Medicine

## 2015-03-23 ENCOUNTER — Ambulatory Visit (INDEPENDENT_AMBULATORY_CARE_PROVIDER_SITE_OTHER): Payer: Medicare Other | Admitting: Family Medicine

## 2015-03-23 ENCOUNTER — Ambulatory Visit
Admission: RE | Admit: 2015-03-23 | Discharge: 2015-03-23 | Disposition: A | Discharge status: Home / Self Care | Payer: Medicare Other | Source: Ambulatory Visit | Attending: Family Medicine | Admitting: Family Medicine

## 2015-03-23 VITALS — BP 118/78 | HR 91 | Temp 97.6°F | Resp 16 | Wt 136.4 lb

## 2015-03-23 DIAGNOSIS — R05 Cough: Secondary | ICD-10-CM | POA: Diagnosis not present

## 2015-03-23 DIAGNOSIS — Z8639 Personal history of other endocrine, nutritional and metabolic disease: Secondary | ICD-10-CM | POA: Diagnosis not present

## 2015-03-23 DIAGNOSIS — E7800 Pure hypercholesterolemia, unspecified: Secondary | ICD-10-CM | POA: Diagnosis not present

## 2015-03-23 DIAGNOSIS — R053 Chronic cough: Secondary | ICD-10-CM

## 2015-03-23 DIAGNOSIS — J449 Chronic obstructive pulmonary disease, unspecified: Secondary | ICD-10-CM | POA: Insufficient documentation

## 2015-03-23 NOTE — Progress Notes (Signed)
Patient ID: Mary Howe, female   DOB: 04-24-1932, 81 y.o.   MRN: [id number]   Patient: Mary Howe Female    DOB: 1932/10/12   81 y.o.   MRN: [id number] Visit Date: 03/23/2015  Today's Provider: Vernie Murders, PA   Chief Complaint  Patient presents with  . Bronchitis  . Follow-up   Subjective:    HPI Bronchitis Follow Up: 4 week follow up. Patient states she finished Amoxil. Patient reports taking Tessalon and Duke's magic mouthwash PRN for cough and hoarseness. Sore throat got better with use of Duke's Mouthwash. Still some post nasal drip remains but no further fever or significant cough. History of Grave's Disease Was diagnosed "years" ago (1990) and did not have any thyroid surgery. Was on thyroid supplement and concerned about function now. Denies heat or cold intolerance, constipation, sweats, palpitations, weight gain or loss, etc.  Patient Active Problem List   Diagnosis Date Noted  . DD (diverticular disease) 02/19/2015  . Acid reflux 02/19/2015  . 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 Hypercholesterolemia 02/19/2015  . Neuropathy ([place]) 02/19/2015  . Chemical diabetes ([place]) 02/19/2015  . 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 Stasis, venous 02/19/2015  . Avitaminosis D 02/19/2015   Past Surgical History  Procedure Laterality Date  . Cholecystectomy    . Hemorroidectomy    . Cervical cone biopsy     Family History  Problem Relation Age of Onset  . Prostate cancer Father   . Pneumonia Brother   . Lung disease Brother   . CAD Brother   . Heart disease Brother   . CAD Brother   . Heart attack Brother   . Brain cancer Brother   . Bone cancer Sister   . Emphysema Sister   . Fibrocystic breast disease Sister   . Aneurysm Sister   . Heart disease Sister        No Known Allergies  Previous Medications   BENZONATATE (TESSALON) 200 MG CAPSULE    TAKE 1 CAPSULE (200 MG TOTAL) BY MOUTH 2 (TWO) TIMES DAILY AS NEEDED  FOR COUGH.   CALCIUM CARBONATE (OS-CAL) 600 MG TABS TABLET    Take by mouth.   DIPHENHYD-HYDROCORT-NYSTATIN (FIRST-DUKES MOUTHWASH) SUSP    USE AS DIRECTED 5 MLS IN THE MOUTH OR THROAT 4 (FOUR) TIMES DAILY - AFTER MEALS AND AT BEDTIME.   IBUPROFEN (ADVIL,MOTRIN) 800 MG TABLET    TAKE ONE TABLET BY MOUTH TWICE DAILY AS NEEDED   MELOXICAM (MOBIC) 7.5 MG TABLET    Take 7.5 mg by mouth as needed for pain.   MULTIPLE VITAMIN PO    Take by mouth.   NA SULFATE-K SULFATE-MG SULF (SUPREP BOWEL PREP) SOLN    Take 1 kit by mouth as directed.   PANTOPRAZOLE (PROTONIX) 40 MG TABLET    TAKE ONE TABLET BY MOUTH ONE TIME DAILY   SIMVASTATIN (ZOCOR) 40 MG TABLET    TAKE ONE TABLET BY MOUTH NIGHTLY AT BEDTIME    Review of Systems  Constitutional: Negative.   HENT: Negative.   Eyes: Negative.   Respiratory: Positive for cough.   Cardiovascular: Negative.   Gastrointestinal: Negative.   Endocrine: Negative.   Genitourinary: Negative.   Musculoskeletal: Negative.   Skin: Negative.   Allergic/Immunologic: Negative.   Neurological: Negative.   Hematological: Negative.   Psychiatric/Behavioral: Negative.     Social History  Substance Use Topics  . Smoking status: Former Research scientist (life sciences)  . Smokeless tobacco: Never Used  . Alcohol Use: 0.0 oz/week  0 Standard drinks or equivalent per week     Comment: OCCASIONALLY DRINKS WINE   Objective:   BP 118/78 mmHg  Pulse 91  Temp(Src) 97.6 F (36.4 C) (Oral)  Resp 16  Wt 136 lb 6.4 oz (61.871 kg)  SpO2 99% Physical Exam  Constitutional: She is oriented to person, place, and time. She appears well-developed and well-nourished.  HENT:  Head: Normocephalic.  Mouth/Throat: Oropharynx is clear and moist.  Hearing loss with bilateral hearing aids.  Eyes: EOM are normal.  Neck: Normal range of motion. Neck supple. No thyromegaly present.  Cardiovascular: Normal rate, regular rhythm and normal heart sounds.   Pulmonary/Chest: Effort normal and breath sounds  normal.  Lymphadenopathy:    She has no cervical adenopathy.  Neurological: She is alert and oriented to person, place, and time.  Psychiatric: She has a normal mood and affect. Her behavior is normal. Thought content normal.      Assessment & Plan:      1. Persistent cough Still concerned about occasional cough without sputum. Confirms post nasal drip but no further sore throat. Spirometry normal. Will get CXR (history of smoking in the past - quit in 1989). Recheck labs for any sign of persistent infection. Recheck pending reports. - CBC with Differential/Platelet - DG Chest 2 View - Spirometry with graph  2. Hx of Graves' disease Was diagnosed in the 1990's and was on thyroid supplement. Been off it for years. Concerned about function and will get screening TSH. - TSH  3. Hypercholesterolemia Tolerating statin and trying to follow low fat diet. Recheck labs and follow up pending reports. - Comprehensive metabolic panel - Lipid panel

## 2015-03-24 ENCOUNTER — Telehealth: Payer: Self-pay

## 2015-03-24 LAB — COMPREHENSIVE METABOLIC PANEL
ALT: 18 IU/L (ref 0–32)
AST: 24 IU/L (ref 0–40)
Albumin/Globulin Ratio: 1.8 (ref 1.1–2.5)
Albumin: 4.5 g/dL (ref 3.5–4.7)
Alkaline Phosphatase: 65 IU/L (ref 39–117)
BUN/Creatinine Ratio: 13 (ref 11–26)
BUN: 7 mg/dL — AB (ref 8–27)
Bilirubin Total: 0.5 mg/dL (ref 0.0–1.2)
CALCIUM: 9.4 mg/dL (ref 8.7–10.3)
CO2: 23 mmol/L (ref 18–29)
Chloride: 99 mmol/L (ref 96–106)
Creatinine, Ser: 0.54 mg/dL — ABNORMAL LOW (ref 0.57–1.00)
GFR calc Af Amer: 103 mL/min/{1.73_m2} (ref >59)
GFR, EST NON AFRICAN AMERICAN: 89 mL/min/{1.73_m2} (ref >59)
GLUCOSE: 112 mg/dL — AB (ref 65–99)
Globulin, Total: 2.5 g/dL (ref 1.5–4.5)
POTASSIUM: 4 mmol/L (ref 3.5–5.2)
Sodium: 139 mmol/L (ref 134–144)
TOTAL PROTEIN: 7 g/dL (ref 6.0–8.5)

## 2015-03-24 LAB — CBC WITH DIFFERENTIAL/PLATELET
BASOS ABS: 0 10*3/uL (ref 0.0–0.2)
Basos: 0 %
EOS (ABSOLUTE): 0.1 10*3/uL (ref 0.0–0.4)
Eos: 1 %
Hematocrit: 43.3 % (ref 34.0–46.6)
Hemoglobin: 14.6 g/dL (ref 11.1–15.9)
IMMATURE GRANS (ABS): 0 10*3/uL (ref 0.0–0.1)
Immature Granulocytes: 0 %
Lymphocytes Absolute: 1.8 10*3/uL (ref 0.7–3.1)
Lymphs: 30 %
MCH: 30.6 pg (ref 26.6–33.0)
MCHC: 33.7 g/dL (ref 31.5–35.7)
MCV: 91 fL (ref 79–97)
MONOS ABS: 0.4 10*3/uL (ref 0.1–0.9)
Monocytes: 7 %
Neutrophils Absolute: 3.6 10*3/uL (ref 1.4–7.0)
Neutrophils: 62 %
PLATELETS: 246 10*3/uL (ref 150–379)
RBC: 4.77 x10E6/uL (ref 3.77–5.28)
RDW: 13.7 % (ref 12.3–15.4)
WBC: 5.9 10*3/uL (ref 3.4–10.8)

## 2015-03-24 LAB — LIPID PANEL
CHOLESTEROL TOTAL: 179 mg/dL (ref 100–199)
Chol/HDL Ratio: 2.2 ratio units (ref 0.0–4.4)
HDL: 83 mg/dL (ref >39)
LDL CALC: 75 mg/dL (ref 0–99)
TRIGLYCERIDES: 103 mg/dL (ref 0–149)
VLDL Cholesterol Cal: 21 mg/dL (ref 5–40)

## 2015-03-24 LAB — TSH: TSH: 3.75 u[IU]/mL (ref 0.450–4.500)

## 2015-03-24 NOTE — Telephone Encounter (Signed)
-----   Message from Margo Common, [place] sent at 03/23/2015  6:14 PM EST ----- Evidence of COPD on x-ray but no pneumonia or congestive heart failure. Some minimal scarring in the right lung base. Awaiting lab reports.

## 2015-03-24 NOTE — Telephone Encounter (Signed)
Patient advised as directed below. Patient verbalized understanding.  

## 2015-03-24 NOTE — Telephone Encounter (Signed)
LMTCB

## 2015-03-24 NOTE — Telephone Encounter (Signed)
-----   Message from Margo Common, [place] sent at 03/24/2015 12:14 PM EST ----- All blood tests normal. Recheck as needed.

## 2015-04-28 ENCOUNTER — Encounter: Payer: Self-pay | Admitting: Gastroenterology

## 2015-04-28 ENCOUNTER — Ambulatory Visit (INDEPENDENT_AMBULATORY_CARE_PROVIDER_SITE_OTHER): Payer: Medicare Other | Admitting: Gastroenterology

## 2015-04-28 VITALS — BP 148/77 | HR 94 | Temp 98.0°F | Ht 64.0 in | Wt 135.4 lb

## 2015-04-28 DIAGNOSIS — K219 Gastro-esophageal reflux disease without esophagitis: Secondary | ICD-10-CM

## 2015-04-28 DIAGNOSIS — Z86010 Personal history of colonic polyps: Secondary | ICD-10-CM | POA: Diagnosis not present

## 2015-04-28 NOTE — Progress Notes (Signed)
Gastroenterology Consultation  Referring Provider:     Jerrol Banana.,* Primary Care Physician:  Wilhemena Durie, MD Primary Gastroenterologist:  Dr. Allen Norris     Reason for Consultation:     History of colon polyps and heartburn.        HPI:   Mary Howe is a 82 y.o. y/o female referred for consultation & management of colon polyps and heartburn by Dr. Wilhemena Durie, MD.  This patient comes in today to discuss her history of heartburn and colon polyps. The patient had a colonoscopy at the age of 58 with colon polyps at that time. The patient had previous colonoscopies also with polyps. The patient did not have pathology of the polyps and these were done in [place]. The patient also had 2 upper endoscopies in the past without any significant findings. She states that her medication keeps her heartburn under control. The patient has not had any black stools or bloody stools. She also denies any unexplained weight loss fevers chills nausea or vomiting. The patient is now here to discuss whether she needs a repeat upper endoscopy or colonoscopy. The patient is in relatively good health and states that she is still active with walking and water aerobics.  History reviewed. No pertinent past medical history.  Past Surgical History  Procedure Laterality Date  . Cholecystectomy    . Hemorroidectomy    . Cervical cone biopsy      Prior to Admission medications   Medication Sig Start Date End Date Taking? Authorizing Provider  calcium carbonate (OS-CAL) 600 MG TABS tablet Take by mouth. 02/26/13  Yes Historical Provider, MD  ibuprofen (ADVIL,MOTRIN) 800 MG tablet TAKE ONE TABLET BY MOUTH TWICE DAILY AS NEEDED 01/27/15  Yes Richard Maceo Pro., MD  meloxicam (MOBIC) 7.5 MG tablet Take 7.5 mg by mouth as needed for pain.   Yes Historical Provider, MD  MULTIPLE VITAMIN PO Take by mouth. 02/26/13  Yes Historical Provider, MD  simvastatin (ZOCOR) 40 MG tablet TAKE ONE TABLET BY  MOUTH NIGHTLY AT BEDTIME 01/27/15  Yes Richard Maceo Pro., MD  benzonatate (TESSALON) 200 MG capsule Reported on 04/28/2015 02/26/15   Historical Provider, MD  Diphenhyd-Hydrocort-Nystatin (FIRST-DUKES MOUTHWASH) SUSP Reported on 04/28/2015 02/26/15   Historical Provider, MD  Na Sulfate-K Sulfate-Mg Sulf (SUPREP BOWEL PREP) SOLN Take 1 kit by mouth as directed. Patient not taking: Reported on 04/28/2015 03/13/15   Lucilla Lame, MD  pantoprazole (PROTONIX) 40 MG tablet TAKE ONE TABLET BY MOUTH ONE TIME DAILY 01/27/15   Jerrol Banana., MD    Family History  Problem Relation Age of Onset  . Prostate cancer Father   . Pneumonia Brother   . Lung disease Brother   . CAD Brother   . Heart disease Brother   . CAD Brother   . Heart attack Brother   . Brain cancer Brother   . Bone cancer Sister   . Emphysema Sister   . Fibrocystic breast disease Sister   . Aneurysm Sister   . Heart disease Sister      Social History  Substance Use Topics  . Smoking status: Former Research scientist (life sciences)  . Smokeless tobacco: Never Used  . Alcohol Use: 0.0 oz/week    0 Standard drinks or equivalent per week     Comment: OCCASIONALLY DRINKS WINE    Allergies as of 04/28/2015  . (No Known Allergies)    Review of Systems:    All systems reviewed and negative except  where noted in HPI.   Physical Exam:  BP 148/77 mmHg  Pulse 94  Temp(Src) 98 F (36.7 C) (Oral)  Ht _0  (1.626 m)  Wt 135 lb 6.4 oz (61.417 kg)  BMI 23.23 kg/m2 No LMP recorded. Patient is postmenopausal. Psych:  Alert and cooperative. Normal mood and affect. General:   Alert,  Well-developed, well-nourished, pleasant and cooperative in NAD Head:  Normocephalic and atraumatic. Eyes:  Sclera clear, no icterus.   Conjunctiva pink. Ears:  Normal auditory acuity. Nose:  No deformity, discharge, or lesions. Mouth:  No deformity or lesions,oropharynx pink & moist. Neck:  Supple; no masses or thyromegaly. Lungs:  Respirations even and unlabored.   Clear throughout to auscultation.   No wheezes, crackles, or rhonchi. No acute distress. Heart:  Regular rate and rhythm; no murmurs, clicks, rubs, or gallops. Abdomen:  Normal bowel sounds.  No bruits.  Soft, non-tender and non-distended without masses, hepatosplenomegaly or hernias noted.  No guarding or rebound tenderness.  Negative Carnett sign.   Rectal:  Deferred.  Msk:  Symmetrical without gross deformities.  Good, equal movement & strength bilaterally. Pulses:  Normal pulses noted. Extremities:  No clubbing or edema.  No cyanosis. Neurologic:  Alert and oriented x3;  grossly normal neurologically. Skin:  Intact without significant lesions or rashes.  No jaundice. Lymph Nodes:  No significant cervical adenopathy. Psych:  Alert and cooperative. Normal mood and affect.  Imaging Studies: No results found.  Assessment and Plan:   [name] is a 82 y.o. y/o female who comes in today with a history of heartburn. The patient is well controlled on the present medication she takes. She states she does not take her PPI every day and is still controlled with it. The patient also was told that she had a history of colon polyps. Due to the patient's age she has been told that it is not imperative that she has a colonoscopy due to her history of colon polyps. She states that she would not like to undergo a colonoscopy if she does not needed. The patient has been told that the likelihood of her dying from colon cancer is small at her age compared to when she was younger. She has also been told that if she decides to undergo a colonoscopy because it is concerning for her then she will call me and we can discuss doing a colonoscopy on her. Patient has been explained the plan and agrees with it.   Note: This dictation was prepared with Dragon dictation along with smaller phrase technology. Any transcriptional errors that result from this process are unintentional.

## 2015-04-28 NOTE — Patient Instructions (Signed)
Please give us a call if you have questions or concerns. 

## 2015-04-29 ENCOUNTER — Encounter: Payer: Self-pay | Admitting: Family Medicine

## 2015-06-01 ENCOUNTER — Telehealth: Payer: Self-pay | Admitting: Family Medicine

## 2015-06-01 ENCOUNTER — Other Ambulatory Visit: Payer: Self-pay | Admitting: Family Medicine

## 2015-06-01 DIAGNOSIS — K219 Gastro-esophageal reflux disease without esophagitis: Secondary | ICD-10-CM

## 2015-06-01 DIAGNOSIS — E7800 Pure hypercholesterolemia, unspecified: Secondary | ICD-10-CM

## 2015-06-01 MED ORDER — SIMVASTATIN 40 MG PO TABS: 40.0000 mg | ORAL_TABLET | Freq: Every day | ORAL | Status: DC

## 2015-06-01 MED ORDER — PANTOPRAZOLE SODIUM 40 MG PO TBEC: 40.0000 mg | DELAYED_RELEASE_TABLET | Freq: Every day | ORAL | Status: DC

## 2015-06-01 NOTE — Telephone Encounter (Signed)
LM informing pt that refills had been sent.

## 2015-06-01 NOTE — Telephone Encounter (Signed)
Pt contacted office for refill request on the following medications:  90 day supply.  [address]  CB#[phone]/MW  simvastatin (ZOCOR) 40 MG tablet  pantoprazole (PROTONIX) 40 MG tablet

## 2015-07-23 ENCOUNTER — Other Ambulatory Visit: Payer: Self-pay

## 2015-07-23 MED ORDER — VALACYCLOVIR HCL 1 G PO TABS: 1000.0000 mg | ORAL_TABLET | Freq: Two times a day (BID) | ORAL | Status: DC

## 2015-07-23 NOTE — Telephone Encounter (Signed)
Advise patient refill sent to [facility].

## 2015-07-23 NOTE — Telephone Encounter (Signed)
Patient is requesting a RX for Valtrex 500 mg be sent to [facility] for a fever blister that came up this morning. Patient states she a history of fever blisters and has always used Valtrex to treat.

## 2015-07-23 NOTE — Telephone Encounter (Signed)
Patient advised.

## 2015-11-10 ENCOUNTER — Ambulatory Visit (INDEPENDENT_AMBULATORY_CARE_PROVIDER_SITE_OTHER): Payer: Medicare Other | Admitting: Family Medicine

## 2015-11-10 ENCOUNTER — Encounter: Payer: Self-pay | Admitting: Family Medicine

## 2015-11-10 VITALS — BP 136/72 | HR 84 | Temp 98.0°F | Resp 16 | Ht 64.0 in | Wt 141.0 lb

## 2015-11-10 DIAGNOSIS — L739 Follicular disorder, unspecified: Secondary | ICD-10-CM | POA: Diagnosis not present

## 2015-11-10 NOTE — Progress Notes (Signed)
Patient: Mary Howe Female    DOB: 01-31-1933   82 y.o.   MRN: [id number] Visit Date: 11/10/2015  Today's Provider: Vernie Murders, PA   Chief Complaint  Patient presents with  . Tick Removal    possibly   Subjective:    HPI Patient reports that she may have had a tick bite on her left groin area. She reports that she has redness and tenderness around the area that she may have been bitten. Patient has not been using anything topical on the area. Denies any drainage.  No past medical history on file. Patient Active Problem List   Diagnosis Date Noted  . DD (diverticular disease) 02/19/2015  . Acid reflux 02/19/2015  . 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 Hypercholesterolemia 02/19/2015  . Neuropathy ([place]) 02/19/2015  . 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 Stasis, venous 02/19/2015  . Avitaminosis D 02/19/2015   Past Surgical History:  Procedure Laterality Date  . CERVICAL CONE BIOPSY    . CHOLECYSTECTOMY    . HEMORROIDECTOMY     Family History  Problem Relation Age of Onset  . Prostate cancer Father   . Pneumonia Brother   . Lung disease Brother   . CAD Brother   . Heart disease Brother   . CAD Brother   . Heart attack Brother   . Brain cancer Brother   . Bone cancer Sister   . Emphysema Sister   . Fibrocystic breast disease Sister   . Aneurysm Sister   . Heart disease Sister    No Known Allergies   Current Meds  Medication Sig  . calcium carbonate (OS-CAL) 600 MG TABS tablet Take by mouth.  Marland Kitchen ibuprofen (ADVIL,MOTRIN) 800 MG tablet TAKE ONE TABLET BY MOUTH TWICE DAILY AS NEEDED  . meloxicam (MOBIC) 7.5 MG tablet Take 7.5 mg by mouth as needed for pain.  Marland Kitchen MULTIPLE VITAMIN PO Take by mouth.  . pantoprazole (PROTONIX) 40 MG tablet Take 1 tablet (40 mg total) by mouth daily.  . simvastatin (ZOCOR) 40 MG tablet Take 1 tablet (40 mg total) by mouth at bedtime.  .  valACYclovir (VALTREX) 1000 MG tablet Take 1 tablet (1,000 mg total) by mouth 2 (two) times daily.    Review of Systems  Constitutional: Negative.   Skin: Positive for color change. Negative for rash and wound.  Neurological: Negative.     Social History  Substance Use Topics  . Smoking status: Former Research scientist (life sciences)  . Smokeless tobacco: Never Used  . Alcohol use 0.0 oz/week     Comment: OCCASIONALLY DRINKS WINE   Objective:   BP 136/72 (BP Location: Right Arm, Patient Position: Sitting, Cuff Size: Normal)   Pulse 84   Temp 98 F (36.7 C)   Resp 16   Ht 5\' 4"  (1.626 m)   Wt 141 lb (64 kg)   BMI 24.20 kg/m   Physical Exam  Constitutional: She is oriented to person, place, and time. She appears well-developed and well-nourished. No distress.  HENT:  Head: Normocephalic and atraumatic.  Right Ear: Hearing normal.  Left Ear: Hearing normal.  Nose: Nose normal.  Eyes: Conjunctivae and lids are normal. Right eye exhibits no discharge. Left eye exhibits no discharge. No scleral icterus.  Pulmonary/Chest: Effort normal. No respiratory distress.  Musculoskeletal: Normal range of motion.  Neurological: She is alert and oriented to person, place, and time.  Skin: Skin is intact. No lesion and no  rash noted.  2 mm scab on the left labia majora with some surrounding erythema. No local lymphadenopathy or purulent discharge.  Psychiatric: She has a normal mood and affect. Her speech is normal and behavior is normal. Thought content normal.      Assessment & Plan:     1. Folliculitis Small pimple-type lesion she tried to "mash" today. Worried it was a tick but none seen. May use Epsom Saltwater soaks and use Band-Aid with Neosporin if soreness develops. Recheck prn.       Vernie Murders, PA  [facility]

## 2015-12-08 ENCOUNTER — Encounter: Payer: Self-pay | Admitting: Family Medicine

## 2015-12-08 ENCOUNTER — Ambulatory Visit (INDEPENDENT_AMBULATORY_CARE_PROVIDER_SITE_OTHER): Payer: Medicare Other | Admitting: Family Medicine

## 2015-12-08 VITALS — BP 138/60 | HR 78 | Temp 98.3°F | Resp 16 | Wt 136.0 lb

## 2015-12-08 DIAGNOSIS — B029 Zoster without complications: Secondary | ICD-10-CM | POA: Diagnosis not present

## 2015-12-08 MED ORDER — VALACYCLOVIR HCL 1 G PO TABS: 1000.0000 mg | ORAL_TABLET | Freq: Three times a day (TID) | ORAL | 0 refills | Status: DC

## 2015-12-08 NOTE — Patient Instructions (Signed)
Let us know if not improving or you develop nerve pain.

## 2015-12-08 NOTE — Progress Notes (Signed)
Subjective:     Patient ID: Mary Howe, female   DOB: Sep 03, 1932, 82 y.o.   MRN: [id number]  HPI  Chief Complaint  Patient presents with  . Rash    Patient comes in office today with concerns of rash that has been present on her left shoulder for the past 3 days.   States it has been itching but not burning or tingling. Reports a tick bite in the same area this Spring. Reports hx if shingles in her lower abdominal area years ago, recurrent cold sores, and the Zostavax vaccine.   Review of Systems     Objective:   Physical Exam  Constitutional: She appears well-developed and well-nourished. No distress.  Skin:  Left upper back with cluster of papules on an erythematous base. No vesicles at this time.       Assessment:    1. Shingles rash - valACYclovir (VALTREX) 1000 MG tablet; Take 1 tablet (1,000 mg total) by mouth 3 (three) times daily.  Dispense: 21 tablet; Refill: 0    Plan:    Further f/u if not improving or develops nerve pain. Discussed precautions with her.

## 2015-12-21 ENCOUNTER — Ambulatory Visit (INDEPENDENT_AMBULATORY_CARE_PROVIDER_SITE_OTHER): Payer: Medicare Other | Admitting: Family Medicine

## 2015-12-21 ENCOUNTER — Encounter: Payer: Self-pay | Admitting: Family Medicine

## 2015-12-21 VITALS — BP 122/64 | HR 78 | Temp 97.6°F | Resp 15 | Wt 140.8 lb

## 2015-12-21 DIAGNOSIS — R21 Rash and other nonspecific skin eruption: Secondary | ICD-10-CM | POA: Diagnosis not present

## 2015-12-21 MED ORDER — FLUOCINONIDE 0.05 % EX CREA: TOPICAL_CREAM | CUTANEOUS | 0 refills | Status: DC

## 2015-12-21 NOTE — Progress Notes (Signed)
Subjective:     Patient ID: Mary Howe, female   DOB: 06-12-1932, 82 y.o.   MRN: [id number]  HPI  Chief Complaint  Patient presents with  . Herpes Zoster    Patient returns to office to follow up in regards to shingles, last office visit was 12/08/15. At last visit patient was started on Valtrex 1000mg  TID, patient reports that she completed medication last Monday but rash has not cleared. Patient states that rash is mostly on left side but is now spreading more. Patient states that she has been using otc cream and Ibuprofen.   States she has been applying ? Aquaphor cream left over from prior prescription. Describes current rash as pruritic   Review of Systems     Objective:   Physical Exam  Constitutional: She appears well-developed and well-nourished. No distress.  Skin:  Erythematous plaques have resolved. No vesicles, pustules, scaling or crusting. Has multiple 2-3 mm. Papules on her left upper back to her shoulder. A few cross the midline of her back.       Assessment:    1. Rash and nonspecific skin eruption: suspect secondary reaction to topical cream or preexisting atopic reaction. - fluocinonide cream (LIDEX) 0.05 %; Apply 3 x day to back rash  Dispense: 30 g; Refill: 0    Plan:    Discussed use of mild soaps in the bath. Call if not improving.

## 2015-12-21 NOTE — Patient Instructions (Signed)
Stop the Aquaphor and just use the prescription cream. Use moisturizing soap in the bath like Dove or Aveeno.

## 2016-01-08 DIAGNOSIS — H2513 Age-related nuclear cataract, bilateral: Secondary | ICD-10-CM | POA: Diagnosis not present

## 2016-01-12 ENCOUNTER — Emergency Department: Payer: Medicare Other

## 2016-01-12 ENCOUNTER — Encounter: Payer: Self-pay | Admitting: Emergency Medicine

## 2016-01-12 ENCOUNTER — Observation Stay
Admission: EM | Admit: 2016-01-12 | Discharge: 2016-01-13 | Disposition: A | Discharge status: Home / Self Care | Payer: Medicare Other | Attending: Internal Medicine | Admitting: Internal Medicine

## 2016-01-12 DIAGNOSIS — T3995XA Adverse effect of unspecified nonopioid analgesic, antipyretic and antirheumatic, initial encounter: Secondary | ICD-10-CM | POA: Diagnosis not present

## 2016-01-12 DIAGNOSIS — D72829 Elevated white blood cell count, unspecified: Secondary | ICD-10-CM | POA: Insufficient documentation

## 2016-01-12 DIAGNOSIS — E785 Hyperlipidemia, unspecified: Secondary | ICD-10-CM | POA: Insufficient documentation

## 2016-01-12 DIAGNOSIS — Z836 Family history of other diseases of the respiratory system: Secondary | ICD-10-CM | POA: Diagnosis not present

## 2016-01-12 DIAGNOSIS — Z8249 Family history of ischemic heart disease and other diseases of the circulatory system: Secondary | ICD-10-CM | POA: Insufficient documentation

## 2016-01-12 DIAGNOSIS — R0602 Shortness of breath: Secondary | ICD-10-CM | POA: Diagnosis not present

## 2016-01-12 DIAGNOSIS — G92 Toxic encephalopathy: Secondary | ICD-10-CM | POA: Insufficient documentation

## 2016-01-12 DIAGNOSIS — I700 Atherosclerosis of aorta: Secondary | ICD-10-CM | POA: Insufficient documentation

## 2016-01-12 DIAGNOSIS — E871 Hypo-osmolality and hyponatremia: Secondary | ICD-10-CM | POA: Insufficient documentation

## 2016-01-12 DIAGNOSIS — Z87891 Personal history of nicotine dependence: Secondary | ICD-10-CM | POA: Diagnosis not present

## 2016-01-12 DIAGNOSIS — Z808 Family history of malignant neoplasm of other organs or systems: Secondary | ICD-10-CM | POA: Insufficient documentation

## 2016-01-12 DIAGNOSIS — J9602 Acute respiratory failure with hypercapnia: Secondary | ICD-10-CM | POA: Diagnosis present

## 2016-01-12 DIAGNOSIS — Z79899 Other long term (current) drug therapy: Secondary | ICD-10-CM | POA: Diagnosis not present

## 2016-01-12 DIAGNOSIS — K219 Gastro-esophageal reflux disease without esophagitis: Secondary | ICD-10-CM | POA: Insufficient documentation

## 2016-01-12 DIAGNOSIS — I517 Cardiomegaly: Secondary | ICD-10-CM | POA: Diagnosis not present

## 2016-01-12 DIAGNOSIS — M546 Pain in thoracic spine: Secondary | ICD-10-CM

## 2016-01-12 DIAGNOSIS — R42 Dizziness and giddiness: Secondary | ICD-10-CM | POA: Diagnosis not present

## 2016-01-12 DIAGNOSIS — R52 Pain, unspecified: Secondary | ICD-10-CM

## 2016-01-12 DIAGNOSIS — M25512 Pain in left shoulder: Secondary | ICD-10-CM | POA: Diagnosis not present

## 2016-01-12 DIAGNOSIS — M549 Dorsalgia, unspecified: Secondary | ICD-10-CM | POA: Diagnosis not present

## 2016-01-12 DIAGNOSIS — M5134 Other intervertebral disc degeneration, thoracic region: Secondary | ICD-10-CM | POA: Insufficient documentation

## 2016-01-12 DIAGNOSIS — Z9049 Acquired absence of other specified parts of digestive tract: Secondary | ICD-10-CM | POA: Insufficient documentation

## 2016-01-12 DIAGNOSIS — Z8042 Family history of malignant neoplasm of prostate: Secondary | ICD-10-CM | POA: Insufficient documentation

## 2016-01-12 DIAGNOSIS — J984 Other disorders of lung: Secondary | ICD-10-CM | POA: Diagnosis not present

## 2016-01-12 DIAGNOSIS — J439 Emphysema, unspecified: Secondary | ICD-10-CM | POA: Diagnosis not present

## 2016-01-12 DIAGNOSIS — R0902 Hypoxemia: Secondary | ICD-10-CM | POA: Insufficient documentation

## 2016-01-12 HISTORY — DX: Hyperlipidemia, unspecified: E78.5

## 2016-01-12 LAB — COMPREHENSIVE METABOLIC PANEL
ALT: 23 U/L (ref 14–54)
ANION GAP: 7 (ref 5–15)
AST: 29 U/L (ref 15–41)
Albumin: 4.2 g/dL (ref 3.5–5.0)
Alkaline Phosphatase: 51 U/L (ref 38–126)
BILIRUBIN TOTAL: 0.8 mg/dL (ref 0.3–1.2)
BUN: 15 mg/dL (ref 6–20)
CO2: 25 mmol/L (ref 22–32)
Calcium: 8.9 mg/dL (ref 8.9–10.3)
Chloride: 98 mmol/L — ABNORMAL LOW (ref 101–111)
Creatinine, Ser: 0.64 mg/dL (ref 0.44–1.00)
Glucose, Bld: 153 mg/dL — ABNORMAL HIGH (ref 65–99)
POTASSIUM: 3.6 mmol/L (ref 3.5–5.1)
Sodium: 130 mmol/L — ABNORMAL LOW (ref 135–145)
TOTAL PROTEIN: 7.3 g/dL (ref 6.5–8.1)

## 2016-01-12 LAB — BLOOD GAS, ARTERIAL
ACID-BASE DEFICIT: 0.5 mmol/L (ref 0.0–2.0)
Bicarbonate: 24.2 mmol/L (ref 20.0–28.0)
FIO2: 0.32
O2 SAT: 92.1 %
PO2 ART: 64 mmHg — AB (ref 83.0–108.0)
Patient temperature: 37
pCO2 arterial: 39 mmHg (ref 32.0–48.0)
pH, Arterial: 7.4 (ref 7.350–7.450)

## 2016-01-12 LAB — URINALYSIS COMPLETE WITH MICROSCOPIC (ARMC ONLY)
BACTERIA UA: NONE SEEN
BILIRUBIN URINE: NEGATIVE
GLUCOSE, UA: NEGATIVE mg/dL
Hgb urine dipstick: NEGATIVE
Leukocytes, UA: NEGATIVE
NITRITE: NEGATIVE
Protein, ur: 30 mg/dL — AB
Specific Gravity, Urine: 1.043 — ABNORMAL HIGH (ref 1.005–1.030)
pH: 6 (ref 5.0–8.0)

## 2016-01-12 LAB — TROPONIN I: Troponin I: <0.03 ng/mL (ref <0.03)

## 2016-01-12 LAB — CBC
HCT: 44 % (ref 35.0–47.0)
Hemoglobin: 14.9 g/dL (ref 12.0–16.0)
MCH: 31.7 pg (ref 26.0–34.0)
MCHC: 33.8 g/dL (ref 32.0–36.0)
MCV: 93.7 fL (ref 80.0–100.0)
PLATELETS: 233 10*3/uL (ref 150–440)
RBC: 4.69 MIL/uL (ref 3.80–5.20)
RDW: 14.2 % (ref 11.5–14.5)
WBC: 12.4 10*3/uL — AB (ref 3.6–11.0)

## 2016-01-12 LAB — FIBRIN DERIVATIVES D-DIMER (ARMC ONLY): FIBRIN DERIVATIVES D-DIMER (ARMC): 1119 — AB (ref 0–499)

## 2016-01-12 LAB — TSH: TSH: 2.123 u[IU]/mL (ref 0.350–4.500)

## 2016-01-12 MED ORDER — ONDANSETRON HCL 4 MG/2ML IJ SOLN: 4.0000 mg | Freq: Four times a day (QID) | INTRAMUSCULAR | Status: DC | PRN

## 2016-01-12 MED ORDER — PANTOPRAZOLE SODIUM 40 MG PO TBEC
40.0000 mg | DELAYED_RELEASE_TABLET | Freq: Every day | ORAL | Status: DC
  Administered 2016-01-13: 08:00:00 40 mg via ORAL
  Filled 2016-01-12: qty 1

## 2016-01-12 MED ORDER — SENNOSIDES-DOCUSATE SODIUM 8.6-50 MG PO TABS: 1.0000 | ORAL_TABLET | Freq: Every evening | ORAL | Status: DC | PRN

## 2016-01-12 MED ORDER — ACETAMINOPHEN 325 MG PO TABS: 650.0000 mg | ORAL_TABLET | Freq: Four times a day (QID) | ORAL | Status: DC | PRN

## 2016-01-12 MED ORDER — IOPAMIDOL (ISOVUE-370) INJECTION 76%
100.0000 mL | Freq: Once | INTRAVENOUS | Status: AC | PRN
  Administered 2016-01-12: 100 mL via INTRAVENOUS

## 2016-01-12 MED ORDER — CALCIUM CARBONATE ANTACID 500 MG PO CHEW
1.0000 | CHEWABLE_TABLET | Freq: Every day | ORAL | Status: DC
  Administered 2016-01-13: 200 mg via ORAL
  Filled 2016-01-12: qty 1

## 2016-01-12 MED ORDER — SODIUM CHLORIDE 0.9 % IV SOLN
INTRAVENOUS | Status: DC
  Administered 2016-01-12: 07:00:00 via INTRAVENOUS

## 2016-01-12 MED ORDER — SODIUM CHLORIDE 0.9% FLUSH
3.0000 mL | Freq: Two times a day (BID) | INTRAVENOUS | Status: DC
  Administered 2016-01-12 – 2016-01-13 (×2): 3 mL via INTRAVENOUS

## 2016-01-12 MED ORDER — POTASSIUM CHLORIDE IN NACL 20-0.9 MEQ/L-% IV SOLN
INTRAVENOUS | Status: DC
  Administered 2016-01-12: 17:00:00 via INTRAVENOUS
  Filled 2016-01-12 (×3): qty 1000

## 2016-01-12 MED ORDER — CALCIUM CARBONATE 600 MG PO TABS: 600.0000 mg | ORAL_TABLET | Freq: Every day | ORAL | Status: DC

## 2016-01-12 MED ORDER — SODIUM CHLORIDE 0.9 % IV BOLUS (SEPSIS)
500.0000 mL | Freq: Once | INTRAVENOUS | Status: AC
  Administered 2016-01-12: 500 mL via INTRAVENOUS

## 2016-01-12 MED ORDER — ACETAMINOPHEN 650 MG RE SUPP: 650.0000 mg | Freq: Four times a day (QID) | RECTAL | Status: DC | PRN

## 2016-01-12 MED ORDER — GABAPENTIN 300 MG PO CAPS
300.0000 mg | ORAL_CAPSULE | Freq: Two times a day (BID) | ORAL | Status: DC
  Administered 2016-01-12 – 2016-01-13 (×2): 300 mg via ORAL
  Filled 2016-01-12 (×2): qty 1

## 2016-01-12 MED ORDER — INFLUENZA VAC SPLIT QUAD 0.5 ML IM SUSY: 0.5000 mL | PREFILLED_SYRINGE | INTRAMUSCULAR | Status: DC

## 2016-01-12 MED ORDER — FLUOCINONIDE 0.05 % EX CREA
TOPICAL_CREAM | Freq: Two times a day (BID) | CUTANEOUS | Status: DC
  Administered 2016-01-12 – 2016-01-13 (×2): via TOPICAL
  Filled 2016-01-12: qty 30

## 2016-01-12 MED ORDER — SODIUM CHLORIDE 0.9% FLUSH: 3.0000 mL | INTRAVENOUS | Status: DC | PRN

## 2016-01-12 MED ORDER — SODIUM CHLORIDE 0.9 % IV SOLN: 250.0000 mL | INTRAVENOUS | Status: DC | PRN

## 2016-01-12 MED ORDER — ONDANSETRON HCL 4 MG/2ML IJ SOLN
4.0000 mg | Freq: Once | INTRAMUSCULAR | Status: DC
Start: 2016-01-12 — End: 2016-01-14
  Filled 2016-01-12: qty 2

## 2016-01-12 MED ORDER — ONDANSETRON HCL 4 MG PO TABS: 4.0000 mg | ORAL_TABLET | Freq: Four times a day (QID) | ORAL | Status: DC | PRN

## 2016-01-12 MED ORDER — ENOXAPARIN SODIUM 40 MG/0.4ML ~~LOC~~ SOLN
40.0000 mg | SUBCUTANEOUS | Status: DC
Start: 2016-01-12 — End: 2016-01-14
  Administered 2016-01-12: 22:00:00 40 mg via SUBCUTANEOUS
  Filled 2016-01-12: qty 0.4

## 2016-01-12 MED ORDER — LIDOCAINE 5 % EX PTCH
1.0000 | MEDICATED_PATCH | CUTANEOUS | Status: DC
  Administered 2016-01-12 – 2016-01-13 (×2): 1 via TRANSDERMAL
  Filled 2016-01-12 (×3): qty 1

## 2016-01-12 MED ORDER — FENTANYL CITRATE (PF) 100 MCG/2ML IJ SOLN
50.0000 ug | Freq: Once | INTRAMUSCULAR | Status: AC
  Administered 2016-01-12: 50 ug via INTRAVENOUS
  Filled 2016-01-12: qty 2

## 2016-01-12 MED ORDER — SIMVASTATIN 40 MG PO TABS
40.0000 mg | ORAL_TABLET | Freq: Every day | ORAL | Status: DC
  Administered 2016-01-12: 22:00:00 40 mg via ORAL
  Filled 2016-01-12: qty 1

## 2016-01-12 MED ORDER — ADULT MULTIVITAMIN W/MINERALS CH
1.0000 | ORAL_TABLET | Freq: Every day | ORAL | Status: DC
  Administered 2016-01-13: 08:00:00 1 via ORAL
  Filled 2016-01-12: qty 1

## 2016-01-12 NOTE — H&P (Signed)
[facility] [place] NAME: Mary Howe    MR#:  [id number]  DATE OF BIRTH:  [birth year]  DATE OF ADMISSION:  01/12/2016  PRIMARY CARE PHYSICIAN: Wilhemena Durie, MD   REQUESTING/REFERRING PHYSICIAN:   CHIEF COMPLAINT:   Chief Complaint  Patient presents with  . Chest Pain    HISTORY OF PRESENT ILLNESS: Mary Howe  is a 82 y.o. female with a known history of Hyperlipidemia for which she is taking statin medication presented to the emergency room with back pain. Patient had his back pain and in the mid back area and started around last midnight. No complaints of any chest pain. No history of any bowel or bladder incontinence. No weakness in the lower extremities or any tingling numbness sensation. Patient had elevated blood pressure at home and hence EMS was called. She does not have a history of hypertension. Blood pressures normalized in the emergency room. The pain in the back is aching in nature 4 out of 10 on a scale of 1-10. Patient had shingles a year ago in the upper mid back area and she was treated with Valtrex. No lesions currently noted on the back. Patient has an active lifestyle does the household chores, and does yard work on a day-to-day basis and does pretty active and instrument activities of daily living. No headache dizziness or blurry vision. No syncope or seizure. Patient had low oxygen level in the emergency room she was put on oxygen via nasal cannula. Her d-dimer was high and she was evaluated with CT angiogram of chest which revealed no pulmonary embolism. Hospitalist service was consulted for further care of the patient because of the low oxygen level.No complaints of cough, fever or chills. No history of recent travel or sick contacts at home.  PAST MEDICAL HISTORY:   Past Medical History:  Diagnosis Date  . Hyperlipidemia     PAST SURGICAL HISTORY: Past Surgical History:  Procedure Laterality Date  .  CERVICAL CONE BIOPSY    . CHOLECYSTECTOMY    . HEMORROIDECTOMY      SOCIAL HISTORY:  Social History  Substance Use Topics  . Smoking status: Former Research scientist (life sciences)  . Smokeless tobacco: Never Used  . Alcohol use 0.0 oz/week     Comment: OCCASIONALLY DRINKS WINE    FAMILY HISTORY:  Family History  Problem Relation Age of Onset  . Prostate cancer Father   . Pneumonia Brother   . Lung disease Brother   . CAD Brother   . Heart disease Brother   . CAD Brother   . Heart attack Brother   . Brain cancer Brother   . Bone cancer Sister   . Emphysema Sister   . Fibrocystic breast disease Sister   . Aneurysm Sister   . Heart disease Sister     DRUG ALLERGIES: No Known Allergies  REVIEW OF SYSTEMS:   CONSTITUTIONAL: No fever, fatigue or weakness.  EYES: No blurred or double vision.  EARS, NOSE, AND THROAT: No tinnitus or ear pain.  RESPIRATORY: No cough, shortness of breath, wheezing or hemoptysis.  CARDIOVASCULAR: No chest pain, orthopnea, edema.  GASTROINTESTINAL: No nausea, vomiting, diarrhea or abdominal pain.  GENITOURINARY: No dysuria, hematuria.  ENDOCRINE: No polyuria, nocturia,  HEMATOLOGY: No anemia, easy bruising or bleeding SKIN: No rash or lesion. MUSCULOSKELETAL: No joint pain or arthritis.   Has mid back pain. NEUROLOGIC: No tingling, numbness, weakness.  PSYCHIATRY: No anxiety or depression.   MEDICATIONS AT HOME:  Prior to Admission medications   Medication Sig Start Date End Date Taking? Authorizing Provider  calcium carbonate (OS-CAL) 600 MG TABS tablet Take 600 mg by mouth daily with breakfast.  02/26/13  Yes Historical Provider, MD  fluocinonide cream (LIDEX) 0.05 % Apply 3 x day to back rash 12/21/15  Yes Carmon Ginsberg, PA  ibuprofen (ADVIL,MOTRIN) 800 MG tablet TAKE ONE TABLET BY MOUTH TWICE DAILY AS NEEDED 01/27/15  Yes Jerrol Banana., MD  meloxicam (MOBIC) 7.5 MG tablet Take 7.5 mg by mouth as needed for pain.   Yes Historical Provider, MD  MULTIPLE  VITAMIN PO Take 1 tablet by mouth daily.  02/26/13  Yes Historical Provider, MD  pantoprazole (PROTONIX) 40 MG tablet Take 1 tablet (40 mg total) by mouth daily. 06/01/15  Yes Richard Maceo Pro., MD  simvastatin (ZOCOR) 40 MG tablet Take 1 tablet (40 mg total) by mouth at bedtime. 06/01/15  Yes Richard Maceo Pro., MD      PHYSICAL EXAMINATION:   VITAL SIGNS: Blood pressure 122/62, pulse 83, temperature 98.1 F (36.7 C), temperature source Oral, resp. rate 16, SpO2 94 %.  GENERAL:  82 y.o.-year-old patient lying in the bed with no acute distress.  EYES: Pupils equal, round, reactive to light and accommodation. No scleral icterus. Extraocular muscles intact.  HEENT: Head atraumatic, normocephalic. Oropharynx and nasopharynx clear.  NECK:  Supple, no jugular venous distention. No thyroid enlargement, no tenderness.  LUNGS: Normal breath sounds bilaterally, no wheezing, rales,rhonchi or crepitation. No use of accessory muscles of respiration.  CARDIOVASCULAR: S1, S2 normal. No murmurs, rubs, or gallops.  ABDOMEN: Soft, nontender, nondistended. Bowel sounds present. No organomegaly or mass.  EXTREMITIES: No pedal edema, cyanosis, or clubbing.  NEUROLOGIC: Cranial nerves II through XII are intact. Muscle strength 5/5 in all extremities. Sensation intact. Gait not checked.  PSYCHIATRIC: The patient is alert and oriented x 3.  SKIN: No obvious rash, lesion, or ulcer.   LABORATORY PANEL:   CBC  Recent Labs Lab 01/12/16 0155  WBC 12.4*  HGB 14.9  HCT 44.0  PLT 233  MCV 93.7  MCH 31.7  MCHC 33.8  RDW 14.2   ------------------------------------------------------------------------------------------------------------------  Chemistries   Recent Labs Lab 01/12/16 0155  NA 130*  K 3.6  CL 98*  CO2 25  GLUCOSE 153*  BUN 15  CREATININE 0.64  CALCIUM 8.9  AST 29  ALT 23  ALKPHOS 51  BILITOT 0.8    ------------------------------------------------------------------------------------------------------------------ CrCl cannot be calculated (Unknown ideal weight.). ------------------------------------------------------------------------------------------------------------------ No results for input(s): TSH, T4TOTAL, T3FREE, THYROIDAB in the last 72 hours.  Invalid input(s): FREET3   Coagulation profile No results for input(s): INR, PROTIME in the last 168 hours. ------------------------------------------------------------------------------------------------------------------- No results for input(s): DDIMER in the last 72 hours. -------------------------------------------------------------------------------------------------------------------  Cardiac Enzymes  Recent Labs Lab 01/12/16 0155  TROPONINI <0.03   ------------------------------------------------------------------------------------------------------------------ Invalid input(s): POCBNP  ---------------------------------------------------------------------------------------------------------------  Urinalysis    Component Value Date/Time   COLORURINE YELLOW (A) 01/12/2016 0523   APPEARANCEUR CLEAR (A) 01/12/2016 0523   LABSPEC 1.043 (H) 01/12/2016 0523   PHURINE 6.0 01/12/2016 0523   GLUCOSEU NEGATIVE 01/12/2016 0523   HGBUR NEGATIVE 01/12/2016 0523   BILIRUBINUR NEGATIVE 01/12/2016 0523   BILIRUBINUR neg 02/23/2015 0915   KETONESUR 1+ (A) 01/12/2016 0523   PROTEINUR 30 (A) 01/12/2016 0523   UROBILINOGEN 0.2 02/23/2015 0915   NITRITE NEGATIVE 01/12/2016 0523   LEUKOCYTESUR NEGATIVE 01/12/2016 0523     RADIOLOGY: Ct Head Wo Contrast  Result Date: 01/12/2016 CLINICAL  DATA:  Chest pressure, nausea and vomiting, lightheadedness beginning yesterday. Increasing confusion and hypoxia. EXAM: CT HEAD WITHOUT CONTRAST TECHNIQUE: Contiguous axial images were obtained from the base of the skull through the vertex  without intravenous contrast. COMPARISON:  None. FINDINGS: BRAIN: The ventricles and sulci are normal for age. No intraparenchymal hemorrhage, mass effect nor midline shift. Patchy supratentorial white matter hypodensities less than expected for patient's age, though non-specific are most compatible with chronic small vessel ischemic disease. No acute large vascular territory infarcts. No abnormal extra-axial fluid collections. Basal cisterns are patent. VASCULAR: Moderate calcific atherosclerosis of the carotid siphons. SKULL: No skull fracture. No significant scalp soft tissue swelling. SINUSES/ORBITS: The mastoid air-cells and included paranasal sinuses are well-aerated.The included ocular globes and orbital contents are non-suspicious. OTHER: None. IMPRESSION: Negative CT HEAD for age. Electronically Signed   By: Elon Alas M.D.   On: 01/12/2016 04:53   Ct Angio Chest Pe W And/or Wo Contrast  Result Date: 01/12/2016 CLINICAL DATA:  82 year old female with back pain, worsened with inspiration. EXAM: CT ANGIOGRAPHY CHEST WITH CONTRAST TECHNIQUE: Multidetector CT imaging of the chest was performed using the standard protocol during bolus administration of intravenous contrast. Multiplanar CT image reconstructions and MIPs were obtained to evaluate the vascular anatomy. CONTRAST:  100 cc Isovue 370 COMPARISON:  Chest radiograph dated 01/12/2016 FINDINGS: Cardiovascular: There is mild atherosclerotic calcification of the thoracic aorta. No dissection or aneurysmal dilatation. The origins of the great vessels of the aortic arch appear patent. No CT evidence of pulmonary embolism. Mild cardiomegaly. No pericardial effusion. Mediastinum/Nodes: Top-normal right hilar lymph node. No mediastinal adenopathy. The esophagus is grossly unremarkable. Lungs/Pleura: There is advanced emphysematous changes of the lungs with interstitial coarsening and bibasilar atelectasis/ scarring. No focal consolidation, pleural  effusion, or pneumothorax. The central airways are patent. Upper Abdomen: There is colonic diverticulosis. Left adrenal thickening/ hyperplasia. Musculoskeletal: Mild compression deformity of the inferior endplate of the T3 and superior endplate of the T5, age indeterminate. Clinical correlation is recommended. No retropulsed fragment identified. The bones are osteopenic. Review of the MIP images confirms the above findings. IMPRESSION: No CT evidence of pulmonary embolism. Advanced emphysema. Mild T3 and T5 compression deformities, age indeterminate, possibly chronic. Clinical correlation is recommended. 8 Electronically Signed   By: Anner Crete M.D.   On: 01/12/2016 03:51   Dg Chest Portable 1 View  Result Date: 01/12/2016 CLINICAL DATA:  Chest pressure radiating to the left shoulder and back. Shortness of breath. Vomiting. EXAM: PORTABLE CHEST 1 VIEW COMPARISON:  03/23/2015 FINDINGS: Cardiac enlargement. Pulmonary vascularity is normal. Emphysematous changes in the lungs with scattered fibrosis. Peribronchial thickening suggesting chronic bronchitis. Linear atelectasis or scarring in the left lung base. Tortuous aorta. IMPRESSION: Emphysematous changes, chronic bronchitic changes, and fibrosis in the lungs. Atelectasis or scarring in the left lung base. Mild cardiac enlargement. No focal consolidation or edema. Electronically Signed   By: Lucienne Capers M.D.   On: 01/12/2016 02:31    EKG: Orders placed or performed during the hospital encounter of 01/12/16  . EKG 12-Lead  . EKG 12-Lead    IMPRESSION AND PLAN: 82 year old female patient presented to the emergency room with the back pain and was found to have low oxygen level. Admitting diagnosis 1. Hypoxia 2. Hyponatremia 3. Hyperlipidemia 4. Back pain probably secondary to neuropathy Treatment plan Observation bed. Continue oxygen via nasal cannula IV fluid hydration No evidence of any pneumonia or infectious etiology as of now  for hypoxia Wean oxygen IV fluid hydration  DVT prophylaxis subcutaneous Lovenox 40 MG daily Gabapentin 300 MG orally twice daily Supportive care All the records are reviewed and case discussed with ED provider. Management plans discussed with the patient, family and they are in agreement.  CODE STATUS:FULL Code Status History    This patient does not have a recorded code status. Please follow your organizational policy for patients in this situation.       TOTAL TIME TAKING CARE OF THIS PATIENT: 50 minutes.    Saundra Shelling M.D on 01/12/2016 at 6:14 AM  Between 7am to 6pm - Pager - [phone]  After 6pm go to [email or web address] - password [email or web address] [facility]  Office  [phone]  CC: Primary care physician; Wilhemena Durie, MD

## 2016-01-12 NOTE — ED Provider Notes (Signed)
[facility] Emergency Department Provider Note   ____________________________________________   First MD Initiated Contact with Patient 01/12/16 0144     (approximate)  I have reviewed the triage vital signs and the nursing notes.   HISTORY  Chief Complaint Chest Pain    HPI Mary Howe is a 82 y.o. female who comes into the hospital today with pain in her back. The patient reports that she was not feeling well today so her daughter came to stay with her this evening. She reports that she could not get her blood pressure with her own meter at home so they called EMS to come and check her blood pressure around 7 PM. According to her daughter she had a systolic of [id number]. The daughter reports that she thinks it was due to anxiety and it was coming down before EMS left so they did not come into the hospital. The patient went to bed and was doing okay until later when she called her doctor with pain in her back between her shoulder blades. Her daughter was concerned so decided to have her come into the hospital to get checked out. The patient's heart rate was high and she did vomit when she initially arrived. She reports that she was doing some gardening today and she had drank some flavored water. She reports that she doesn't have chest pain but her chest does feel uncomfortable. She reports that when she takes a deep breath in she feels increased pressure in her back. The patient has a mild headache as well. The patient rates her pain a 5 out of 10 in intensity. She is here today for evaluation.   Past Medical History:  Diagnosis Date  . Hyperlipidemia     Patient Active Problem List   Diagnosis Date Noted  . Hypoxia 01/12/2016  . DD (diverticular disease) 02/19/2015  . Acid reflux 02/19/2015  . 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 Hypercholesterolemia 02/19/2015  . Neuropathy ([place]) 02/19/2015  . 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 Stasis, venous 02/19/2015  . Avitaminosis D 02/19/2015    Past Surgical History:  Procedure Laterality Date  . CERVICAL CONE BIOPSY    . CHOLECYSTECTOMY    . HEMORROIDECTOMY      Prior to Admission medications   Medication Sig Start Date End Date Taking? Authorizing Provider  calcium carbonate (OS-CAL) 600 MG TABS tablet Take 600 mg by mouth daily with breakfast.  02/26/13  Yes Historical Provider, MD  fluocinonide cream (LIDEX) 0.05 % Apply 3 x day to back rash 12/21/15  Yes Carmon Ginsberg, PA  ibuprofen (ADVIL,MOTRIN) 800 MG tablet TAKE ONE TABLET BY MOUTH TWICE DAILY AS NEEDED 01/27/15  Yes Jerrol Banana., MD  meloxicam (MOBIC) 7.5 MG tablet Take 7.5 mg by mouth as needed for pain.   Yes Historical Provider, MD  MULTIPLE VITAMIN PO Take 1 tablet by mouth daily.  02/26/13  Yes Historical Provider, MD  pantoprazole (PROTONIX) 40 MG tablet Take 1 tablet (40 mg total) by mouth daily. 06/01/15  Yes Richard Maceo Pro., MD  simvastatin (ZOCOR) 40 MG tablet Take 1 tablet (40 mg total) by mouth at bedtime. 06/01/15  Yes Richard Maceo Pro., MD    Allergies Review of patient's allergies indicates no known allergies.  Family History  Problem Relation Age of Onset  . Prostate cancer Father   . Pneumonia Brother   . Lung disease Brother   . CAD  Brother   . Heart disease Brother   . CAD Brother   . Heart attack Brother   . Brain cancer Brother   . Bone cancer Sister   . Emphysema Sister   . Fibrocystic breast disease Sister   . Aneurysm Sister   . Heart disease Sister     Social History Social History  Substance Use Topics  . Smoking status: Former Research scientist (life sciences)  . Smokeless tobacco: Never Used  . Alcohol use 0.0 oz/week     Comment: OCCASIONALLY DRINKS WINE    Review of Systems Constitutional: No fever/chills Eyes: No visual changes. ENT: No sore throat. Cardiovascular:  chest pain. Respiratory:   shortness of breath. Gastrointestinal: No abdominal pain.  No nausea, no vomiting.  No diarrhea.  No constipation. Genitourinary: Negative for dysuria. Musculoskeletal:  back pain. Skin: Negative for rash. Neurological: headache  10-point ROS otherwise negative.  ____________________________________________   PHYSICAL EXAM:  VITAL SIGNS: ED Triage Vitals  Enc Vitals Group     BP 01/12/16 0152 126/64     Pulse Rate 01/12/16 0152 (!) 105     Resp 01/12/16 0152 19     Temp 01/12/16 0152 98.1 F (36.7 C)     Temp Source 01/12/16 0152 Oral     SpO2 01/12/16 0152 (!) 83 %     Weight --      Height --      Head Circumference --      Peak Flow --      Pain Score 01/12/16 0154 5     Pain Loc --      Pain Edu? --      Excl. in Hall? --     Constitutional: Alert and oriented. Well appearing and in moderate distress. Eyes: Conjunctivae are normal. PERRL. EOMI. Head: Atraumatic. Nose: No congestion/rhinnorhea. Mouth/Throat: Mucous membranes are moist.  Oropharynx non-erythematous. Cardiovascular: Normal rate, regular rhythm. Grossly normal heart sounds.  Good peripheral circulation. Respiratory: Normal respiratory effort.  No retractions. Lungs CTAB. Gastrointestinal: Soft and nontender. No distention.  Musculoskeletal: No lower extremity tenderness nor edema.   Neurologic:  Normal speech and language.  Skin:  Skin is warm, dry and intact.  Psychiatric: Mood and affect are normal.   ____________________________________________   LABS (all labs ordered are listed, but only abnormal results are displayed)  Labs Reviewed  CBC - Abnormal; Notable for the following:       Result Value   WBC 12.4 (*)    All other components within normal limits  COMPREHENSIVE METABOLIC PANEL - Abnormal; Notable for the following:    Sodium 130 (*)    Chloride 98 (*)    Glucose, Bld 153 (*)    All other components within normal limits  FIBRIN DERIVATIVES D-DIMER ([facility] ONLY) - Abnormal;  Notable for the following:    Fibrin derivatives D-dimer ([facility]) 1,119 (*)    All other components within normal limits  BLOOD GAS, ARTERIAL - Abnormal; Notable for the following:    pO2, Arterial 64 (*)    All other components within normal limits  URINALYSIS COMPLETEWITH MICROSCOPIC ([facility] ONLY) - Abnormal; Notable for the following:    Color, Urine YELLOW (*)    APPearance CLEAR (*)    Ketones, ur 1+ (*)    Specific Gravity, Urine 1.043 (*)    Protein, ur 30 (*)    Squamous Epithelial / LPF 0-5 (*)    All other components within normal limits  TROPONIN I   ____________________________________________  EKG  ED  ECG REPORT I, Loney Hering, the attending physician, personally viewed and interpreted this ECG.   Date: 01/12/2016  EKG Time: 141  Rate: 109  Rhythm: sinus tachycardia  Axis: none  Intervals:none  ST&T Change: none  ____________________________________________  RADIOLOGY  CXR CT angio chest PE ____________________________________________   PROCEDURES  Procedure(s) performed: None  Procedures  Critical Care performed: No  ____________________________________________   INITIAL IMPRESSION / ASSESSMENT AND PLAN / ED COURSE  Pertinent labs & imaging results that were available during my care of the patient were reviewed by me and considered in my medical decision making (see chart for details).  This is an 82 year old female who comes into the hospital today with back pain. The patient reports that she just doesn't feel right. On initial evaluation of the patient we noticed that her oxygen level was 83%. Given the patient's back pain and pain that is worse with deep inspiration my concern is a possible pulmonary embolism. Aortic dissection is also on my differential. I will check some blood work as well as do a chest x-ray and a CT angios to evaluate for possible clot in the patient's lungs. The patient was placed on 2 L by nasal cannula and had some  improvement in her oxygenation. The patient will be reassessed once I receive the results of her blood work and imaging.  Clinical Course  Value Comment By Time  DG Chest Portable 1 View Emphysematous changes, chronic bronchitic changes, and fibrosis in the lungs. Atelectasis or scarring in the left lung base. Mild cardiac enlargement. No focal consolidation or edema   Loney Hering, MD 10/03 0254  CT Angio Chest PE W and/or Wo Contrast No CT evidence of pulmonary embolism.  Advanced emphysema.  Mild T3 and T5 compression deformities, age indeterminate, possibly chronic   Loney Hering, MD 10/03 0358  CT Head Wo Contrast Negative CT HEAD for age Loney Hering, MD 10/03 [phone]   The patient's x-ray and CT shows advanced emphysema although the patient has no history. The patient also has some compression fractures in T3 and T5. The patient has not fallen and has no other injury. The patient also had a CT scan of her head which was negative. Given the hypoxia I will admit the patient to the hospitalist service for further evaluation. The patient is resting comfortably but did give her some fentanyl 50 g IV 1 dose to help with her pain. The patient will be admitted for assessment by the hospitalist service.  ____________________________________________   FINAL CLINICAL IMPRESSION(S) / ED DIAGNOSES  Final diagnoses:  Hypoxia  Acute midline thoracic back pain      NEW MEDICATIONS STARTED DURING THIS VISIT:  New Prescriptions   No medications on file     Note:  This document was prepared using Dragon voice recognition software and may include unintentional dictation errors.    Loney Hering, MD 01/12/16 [phone]

## 2016-01-12 NOTE — Progress Notes (Signed)
[facility] [place] NAME: Mary Howe    MR#:  [id number]  DATE OF BIRTH:  [birth year]  SUBJECTIVE:  CHIEF COMPLAINT:   Chief Complaint  Patient presents with  . Chest Pain   The patient is 82 year old Caucasian female with past medical history significant for history of hyperlipidemia, on statin, who presents to the hospital with complaints of back pain in the mid thoracic area to the left. The pain was described as achiness, no other symptoms were reported. Due to a patient is an ability to take deep breath with pain, patient underwent CT angiogram of the chest, negative for pulmonary embolism, however, emphysema was noted. Patient admits of having some pains. Still remains on oxygen therapy, 3 L via nasal cannula as. Has smoked in the past, however, nothing recently, over the past close to 30 years. Not on oxygen at home.   Review of Systems  Unable to perform ROS: Mental acuity    VITAL SIGNS: Blood pressure (!) 123/51, pulse 86, temperature 97.9 F (36.6 C), temperature source Oral, resp. rate 20, height 5\' 5"  (1.651 m), weight 64 kg (141 lb), SpO2 99 %.  PHYSICAL EXAMINATION:   GENERAL:  82 y.o.-year-old patient lying in the bed with no acute distress. Slow to respond, somewhat confused  EYES: Pupils equal, round, reactive to light and accommodation. No scleral icterus. Extraocular muscles intact.  HEENT: Head atraumatic, normocephalic. Oropharynx and nasopharynx clear.  NECK:  Supple, no jugular venous distention. No thyroid enlargement, no tenderness.  LUNGS: Normal breath sounds bilaterally, no wheezing, rales,rhonchi or crepitation. No use of accessory muscles of respiration. Thoracic spine. Palpation is minimally tender in upper spine, no significant discomfort bilaterally. Paraspinal areas, old shingles rash, discoloration is visible on the left upper thoracic area, no new rash elements CARDIOVASCULAR: S1, S2 normal. No  murmurs, rubs, or gallops.  ABDOMEN: Soft, nontender, nondistended. Bowel sounds present. No organomegaly or mass.  EXTREMITIES: No pedal edema, cyanosis, or clubbing.  NEUROLOGIC: Cranial nerves II through XII are intact. Muscle strength 5/5 in all extremities. Sensation intact. Gait not checked.  PSYCHIATRIC: The patient is alert and oriented x 3.  SKIN: No obvious rash, lesion, or ulcer.   ORDERS/RESULTS REVIEWED:   CBC  Recent Labs Lab 01/12/16 0155  WBC 12.4*  HGB 14.9  HCT 44.0  PLT 233  MCV 93.7  MCH 31.7  MCHC 33.8  RDW 14.2   ------------------------------------------------------------------------------------------------------------------  Chemistries   Recent Labs Lab 01/12/16 0155  NA 130*  K 3.6  CL 98*  CO2 25  GLUCOSE 153*  BUN 15  CREATININE 0.64  CALCIUM 8.9  AST 29  ALT 23  ALKPHOS 51  BILITOT 0.8   ------------------------------------------------------------------------------------------------------------------ estimated creatinine clearance is 49.6 mL/min (by C-G formula based on SCr of 0.64 mg/dL). ------------------------------------------------------------------------------------------------------------------ No results for input(s): TSH, T4TOTAL, T3FREE, THYROIDAB in the last 72 hours.  Invalid input(s): FREET3  Cardiac Enzymes  Recent Labs Lab 01/12/16 0155  TROPONINI <0.03   ------------------------------------------------------------------------------------------------------------------ Invalid input(s): POCBNP ---------------------------------------------------------------------------------------------------------------  RADIOLOGY: Ct Head Wo Contrast  Result Date: 01/12/2016 CLINICAL DATA:  Chest pressure, nausea and vomiting, lightheadedness beginning yesterday. Increasing confusion and hypoxia. EXAM: CT HEAD WITHOUT CONTRAST TECHNIQUE: Contiguous axial images were obtained from the base of the skull through the vertex  without intravenous contrast. COMPARISON:  None. FINDINGS: BRAIN: The ventricles and sulci are normal for age. No intraparenchymal hemorrhage, mass effect nor midline shift. Patchy supratentorial white matter hypodensities less than expected  for patient's age, though non-specific are most compatible with chronic small vessel ischemic disease. No acute large vascular territory infarcts. No abnormal extra-axial fluid collections. Basal cisterns are patent. VASCULAR: Moderate calcific atherosclerosis of the carotid siphons. SKULL: No skull fracture. No significant scalp soft tissue swelling. SINUSES/ORBITS: The mastoid air-cells and included paranasal sinuses are well-aerated.The included ocular globes and orbital contents are non-suspicious. OTHER: None. IMPRESSION: Negative CT HEAD for age. Electronically Signed   By: Elon Alas M.D.   On: 01/12/2016 04:53   Ct Angio Chest Pe W And/or Wo Contrast  Result Date: 01/12/2016 CLINICAL DATA:  82 year old female with back pain, worsened with inspiration. EXAM: CT ANGIOGRAPHY CHEST WITH CONTRAST TECHNIQUE: Multidetector CT imaging of the chest was performed using the standard protocol during bolus administration of intravenous contrast. Multiplanar CT image reconstructions and MIPs were obtained to evaluate the vascular anatomy. CONTRAST:  100 cc Isovue 370 COMPARISON:  Chest radiograph dated 01/12/2016 FINDINGS: Cardiovascular: There is mild atherosclerotic calcification of the thoracic aorta. No dissection or aneurysmal dilatation. The origins of the great vessels of the aortic arch appear patent. No CT evidence of pulmonary embolism. Mild cardiomegaly. No pericardial effusion. Mediastinum/Nodes: Top-normal right hilar lymph node. No mediastinal adenopathy. The esophagus is grossly unremarkable. Lungs/Pleura: There is advanced emphysematous changes of the lungs with interstitial coarsening and bibasilar atelectasis/ scarring. No focal consolidation, pleural  effusion, or pneumothorax. The central airways are patent. Upper Abdomen: There is colonic diverticulosis. Left adrenal thickening/ hyperplasia. Musculoskeletal: Mild compression deformity of the inferior endplate of the T3 and superior endplate of the T5, age indeterminate. Clinical correlation is recommended. No retropulsed fragment identified. The bones are osteopenic. Review of the MIP images confirms the above findings. IMPRESSION: No CT evidence of pulmonary embolism. Advanced emphysema. Mild T3 and T5 compression deformities, age indeterminate, possibly chronic. Clinical correlation is recommended. 31 Electronically Signed   By: Anner Crete M.D.   On: 01/12/2016 03:51   Dg Chest Portable 1 View  Result Date: 01/12/2016 CLINICAL DATA:  Chest pressure radiating to the left shoulder and back. Shortness of breath. Vomiting. EXAM: PORTABLE CHEST 1 VIEW COMPARISON:  03/23/2015 FINDINGS: Cardiac enlargement. Pulmonary vascularity is normal. Emphysematous changes in the lungs with scattered fibrosis. Peribronchial thickening suggesting chronic bronchitis. Linear atelectasis or scarring in the left lung base. Tortuous aorta. IMPRESSION: Emphysematous changes, chronic bronchitic changes, and fibrosis in the lungs. Atelectasis or scarring in the left lung base. Mild cardiac enlargement. No focal consolidation or edema. Electronically Signed   By: Lucienne Capers M.D.   On: 01/12/2016 02:31    EKG:  Orders placed or performed during the hospital encounter of 01/12/16  . EKG 12-Lead  . EKG 12-Lead    ASSESSMENT AND PLAN:  Principal Problem:   Hypoxia #1. Acute respiratory failure with hypoxia, likely splinting due to pain and thoracic area, questionable related to vertebral fracture, continue oxygen therapy, weaning off oxygen as tolerated #2. Upper thoracic spine pain, rule out acute vertebral fracture, getting MRI of the thoracic spine, Lidoderm patch topically, discontinue IV pain medications  due to patient's encephalopathy #3. Encephalopathy due to pain medications, discontinue sedating/mind altering medications, follow clinically #4hyponatremia, follow with IV fluid administration #5. Hyperglycemia, get hemoglobinA1c   #6. Leukocytosis, likely stress related, no obvious infection noted, follow in the morning   Management plans discussed with the patient, family and they are in agreement.   DRUG ALLERGIES: No Known Allergies  CODE STATUS:     Code Status Orders  Start     Ordered   01/12/16 1013  Full code  Continuous     01/12/16 1012    Code Status History    Date Active Date Inactive Code Status Order ID Comments User Context   This patient has a current code status but no historical code status.      TOTAL TIME TAKING CARE OF THIS PATIENT: 40 minutes.    Theodoro Grist M.D on 01/12/2016 at 12:26 PM  Between 7am to 6pm - Pager - [phone]  After 6pm go to [email or web address] - password [email or web address]  Office  [phone]  CC: Primary care physician; Wilhemena Durie, MD

## 2016-01-12 NOTE — Care Management (Addendum)
Admitted to [facility] with the diagnosis of hypoxia under observation status. Lives with sister,  Daughter is Quentin Ore.[phone])  Last was at [facility] a couple of weeks ago for a tick bite. No home health. No skilled facility. No home oxygen. Uses no aids for ambulation. Takes care of all basic and instrumental activities of daily living himself, drives. Prescriptions are filled at [facility] on [address]. No falls. Good appetite.  Varney Baas Luisalberto Beegle RN MSN CCM Care Management  [phone]

## 2016-01-12 NOTE — ED Triage Notes (Signed)
Pt presents to ED via [facility] with c/o chest pressure that radiates to left shoulder and back, associated with nausea and vomiting. Pt states she was feeling light headed earlier yesterday around noon-2pm. Pt reports she started having chest pressure and "hurting" on left shoulder and left upper back in light night. Pt started vomiting at arrival the ED.

## 2016-01-12 NOTE — ED Notes (Signed)
ED RT notified that ABG draw needed.

## 2016-01-12 NOTE — Care Management Obs Status (Signed)
[facility] NOTIFICATION   Patient Details  Name: Mary Howe MRN: [id number] Date of Birth: [birth year]   Medicare Observation Status Notification Given:  Yes    Shelbie Ammons, RN 01/12/2016, 3:10 PM

## 2016-01-12 NOTE — ED Notes (Signed)
Daughter at bedside.

## 2016-01-13 ENCOUNTER — Observation Stay: Payer: Medicare Other

## 2016-01-13 DIAGNOSIS — M549 Dorsalgia, unspecified: Secondary | ICD-10-CM | POA: Diagnosis not present

## 2016-01-13 DIAGNOSIS — E785 Hyperlipidemia, unspecified: Secondary | ICD-10-CM | POA: Diagnosis not present

## 2016-01-13 DIAGNOSIS — M47814 Spondylosis without myelopathy or radiculopathy, thoracic region: Secondary | ICD-10-CM | POA: Diagnosis not present

## 2016-01-13 DIAGNOSIS — R0902 Hypoxemia: Secondary | ICD-10-CM | POA: Diagnosis not present

## 2016-01-13 DIAGNOSIS — R079 Chest pain, unspecified: Secondary | ICD-10-CM | POA: Diagnosis not present

## 2016-01-13 LAB — BASIC METABOLIC PANEL
Anion gap: 2 — ABNORMAL LOW (ref 5–15)
BUN: 10 mg/dL (ref 6–20)
CHLORIDE: 112 mmol/L — AB (ref 101–111)
CO2: 26 mmol/L (ref 22–32)
CREATININE: 0.48 mg/dL (ref 0.44–1.00)
Calcium: 8.3 mg/dL — ABNORMAL LOW (ref 8.9–10.3)
GFR calc Af Amer: >60 mL/min (ref >60)
GFR calc non Af Amer: >60 mL/min (ref >60)
Glucose, Bld: 113 mg/dL — ABNORMAL HIGH (ref 65–99)
POTASSIUM: 3.4 mmol/L — AB (ref 3.5–5.1)
Sodium: 140 mmol/L (ref 135–145)

## 2016-01-13 LAB — CBC
HEMATOCRIT: 36.6 % (ref 35.0–47.0)
Hemoglobin: 12.9 g/dL (ref 12.0–16.0)
MCH: 32.6 pg (ref 26.0–34.0)
MCHC: 35.3 g/dL (ref 32.0–36.0)
MCV: 92.4 fL (ref 80.0–100.0)
PLATELETS: 186 10*3/uL (ref 150–440)
RBC: 3.96 MIL/uL (ref 3.80–5.20)
RDW: 14.1 % (ref 11.5–14.5)
WBC: 8.3 10*3/uL (ref 3.6–11.0)

## 2016-01-13 LAB — HEMOGLOBIN A1C
Hgb A1c MFr Bld: 5.9 % — ABNORMAL HIGH (ref 4.8–5.6)
Mean Plasma Glucose: 123 mg/dL

## 2016-01-13 MED ORDER — GADOBENATE DIMEGLUMINE 529 MG/ML IV SOLN
15.0000 mL | Freq: Once | INTRAVENOUS | Status: AC | PRN
  Administered 2016-01-13: 18:00:00 12 mL via INTRAVENOUS

## 2016-01-13 NOTE — Progress Notes (Signed)
Reviewed home meds, discharge instructions and follow up appointments with patient and daughter discharge to home.

## 2016-01-13 NOTE — Progress Notes (Signed)
Called on call MD Dr Charissa Bash, told her results for MRI not in, she said Dr Benjie Karvonen told her is is ok to discharge patient and they will follow up on MRI as outpatient

## 2016-01-13 NOTE — Plan of Care (Signed)
Problem: Physical Regulation: Goal: Ability to maintain clinical measurements within normal limits will improve Outcome: Not Progressing Patient's oxygen saturation decreased to 88-90% on room air while ambulating, noted for Shortness of breath as well. Patient was hooked back to O2 therapy at 1LPM/[place]. Breathing exercises encouraged. Kept safe and comfortable. Saturating at 93% on 1LPM/[place]. Needs attended.

## 2016-01-13 NOTE — Discharge Summary (Signed)
[facility] [place] NAME: Mary Howe    MR#:  [id number]  DATE OF BIRTH:  Oct 18, 1932  DATE OF ADMISSION:  01/12/2016 ADMITTING PHYSICIAN: Saundra Shelling, MD  DATE OF DISCHARGE: 01/13/2016  PRIMARY CARE PHYSICIAN: Wilhemena Durie, MD    ADMISSION DIAGNOSIS:  Hypoxia [R09.02] Acute midline thoracic back pain [M54.6]  DISCHARGE DIAGNOSIS:  Principal Problem:   Hypoxia   SECONDARY DIAGNOSIS:   Past Medical History:  Diagnosis Date  . Hyperlipidemia     HOSPITAL COURSE:  82 y.o. female with a known history of Hyperlipidemia presented to the emergency room with back pain.   1. Back Pain: She underwent MRI to evaluate for new compression fractures which shows Degenerative T3 inferior endplate and T5 superior endplate mild compression deformities demonstrate slight enhancement and edema and are probably acute to subacute. Otherwise no acute abnormality of the thoracic spine is identified. No cord compression or abnormal cord signal. She will continue PRN pain medications. Her back pain had improved.  2. Hypoxia; This is resolved. Ct scan on admission was negative for PE This is likely due to splinting from pain.  3. HLD: Continue Zocor   DISCHARGE CONDITIONS AND DIET:    CONSULTS OBTAINED:    DRUG ALLERGIES:  No Known Allergies  DISCHARGE MEDICATIONS:   Current Discharge Medication List    CONTINUE these medications which have NOT CHANGED   Details  calcium carbonate (OS-CAL) 600 MG TABS tablet Take 600 mg by mouth daily with breakfast.     fluocinonide cream (LIDEX) 0.05 % Apply 3 x day to back rash Qty: 30 g, Refills: 0   Associated Diagnoses: Rash and nonspecific skin eruption    ibuprofen (ADVIL,MOTRIN) 800 MG tablet TAKE ONE TABLET BY MOUTH TWICE DAILY AS NEEDED Qty: 180 tablet, Refills: 0    meloxicam (MOBIC) 7.5 MG tablet Take 7.5 mg by mouth as needed for pain.    MULTIPLE VITAMIN PO Take 1 tablet  by mouth daily.     pantoprazole (PROTONIX) 40 MG tablet Take 1 tablet (40 mg total) by mouth daily. Qty: 90 tablet, Refills: 3   Associated Diagnoses: Gastroesophageal reflux disease, esophagitis presence not specified    simvastatin (ZOCOR) 40 MG tablet Take 1 tablet (40 mg total) by mouth at bedtime. Qty: 90 tablet, Refills: 3   Associated Diagnoses: Hypercholesterolemia              Today   CHIEF COMPLAINT:  Back pain better    VITAL SIGNS:  Blood pressure (!) 156/86, pulse 90, temperature 98.6 F (37 C), temperature source Oral, resp. rate 18, height 5\' 5"  (1.651 m), weight 64 kg (141 lb), SpO2 94 %.   REVIEW OF SYSTEMS:  Review of Systems  Constitutional: Negative.  Negative for chills, fever and malaise/fatigue.  HENT: Negative.  Negative for ear discharge, ear pain, hearing loss, nosebleeds and sore throat.   Eyes: Negative.  Negative for blurred vision and pain.  Respiratory: Negative.  Negative for cough, hemoptysis, shortness of breath and wheezing.   Cardiovascular: Negative.  Negative for chest pain, palpitations and leg swelling.  Gastrointestinal: Negative.  Negative for abdominal pain, blood in stool, diarrhea, nausea and vomiting.  Genitourinary: Negative.  Negative for dysuria.  Musculoskeletal: Positive for back pain.  Skin: Negative.   Neurological: Negative for dizziness, tremors, speech change, focal weakness, seizures and headaches.  Endo/Heme/Allergies: Negative.  Does not bruise/bleed easily.  Psychiatric/Behavioral: Negative.  Negative for depression, hallucinations and suicidal  ideas.     PHYSICAL EXAMINATION:  GENERAL:  82 y.o.-year-old patient lying in the bed with no acute distress.  NECK:  Supple, no jugular venous distention. No thyroid enlargement, no tenderness.  LUNGS: Normal breath sounds bilaterally, no wheezing, rales,rhonchi  No use of accessory muscles of respiration.  CARDIOVASCULAR: S1, S2 normal. No murmurs, rubs, or  gallops.  ABDOMEN: Soft, non-tender, non-distended. Bowel sounds present. No organomegaly or mass.  EXTREMITIES: No pedal edema, cyanosis, or clubbing.  PSYCHIATRIC: The patient is alert and oriented x 3.  SKIN: No obvious rash, lesion, or ulcer.   DATA REVIEW:   CBC  Recent Labs Lab 01/13/16 0328  WBC 8.3  HGB 12.9  HCT 36.6  PLT 186    Chemistries   Recent Labs Lab 01/12/16 0155 01/13/16 0328  NA 130* 140  K 3.6 3.4*  CL 98* 112*  CO2 25 26  GLUCOSE 153* 113*  BUN 15 10  CREATININE 0.64 0.48  CALCIUM 8.9 8.3*  AST 29  --   ALT 23  --   ALKPHOS 51  --   BILITOT 0.8  --     Cardiac Enzymes  Recent Labs Lab 01/12/16 0155  TROPONINI <0.03    Microbiology Results  @[email or web address] @  RADIOLOGY:  Dg Chest 1 View  Result Date: 01/13/2016 CLINICAL DATA:  Chest pain, hyperlipidemia back pain since midnight, pain rated 4/10 EXAM: CHEST 1 VIEW COMPARISON:  Portable exam 1437 hours compared to 01/12/2016 FINDINGS: Enlargement of cardiac silhouette. Mediastinal contours pulmonary vascularity normal. Emphysematous changes without infiltrate, pleural effusion or pneumothorax. Bones demineralized. IMPRESSION: Minimal enlargement of cardiac silhouette. Emphysematous changes without acute infiltrate. Electronically Signed   By: Lavonia Dana M.D.   On: 01/13/2016 14:51   Ct Head Wo Contrast  Result Date: 01/12/2016 CLINICAL DATA:  Chest pressure, nausea and vomiting, lightheadedness beginning yesterday. Increasing confusion and hypoxia. EXAM: CT HEAD WITHOUT CONTRAST TECHNIQUE: Contiguous axial images were obtained from the base of the skull through the vertex without intravenous contrast. COMPARISON:  None. FINDINGS: BRAIN: The ventricles and sulci are normal for age. No intraparenchymal hemorrhage, mass effect nor midline shift. Patchy supratentorial white matter hypodensities less than expected for patient's age, though non-specific are most compatible with chronic small vessel  ischemic disease. No acute large vascular territory infarcts. No abnormal extra-axial fluid collections. Basal cisterns are patent. VASCULAR: Moderate calcific atherosclerosis of the carotid siphons. SKULL: No skull fracture. No significant scalp soft tissue swelling. SINUSES/ORBITS: The mastoid air-cells and included paranasal sinuses are well-aerated.The included ocular globes and orbital contents are non-suspicious. OTHER: None. IMPRESSION: Negative CT HEAD for age. Electronically Signed   By: Elon Alas M.D.   On: 01/12/2016 04:53   Ct Angio Chest Pe W And/or Wo Contrast  Result Date: 01/12/2016 CLINICAL DATA:  82 year old female with back pain, worsened with inspiration. EXAM: CT ANGIOGRAPHY CHEST WITH CONTRAST TECHNIQUE: Multidetector CT imaging of the chest was performed using the standard protocol during bolus administration of intravenous contrast. Multiplanar CT image reconstructions and MIPs were obtained to evaluate the vascular anatomy. CONTRAST:  100 cc Isovue 370 COMPARISON:  Chest radiograph dated 01/12/2016 FINDINGS: Cardiovascular: There is mild atherosclerotic calcification of the thoracic aorta. No dissection or aneurysmal dilatation. The origins of the great vessels of the aortic arch appear patent. No CT evidence of pulmonary embolism. Mild cardiomegaly. No pericardial effusion. Mediastinum/Nodes: Top-normal right hilar lymph node. No mediastinal adenopathy. The esophagus is grossly unremarkable. Lungs/Pleura: There is advanced emphysematous changes of the lungs with  interstitial coarsening and bibasilar atelectasis/ scarring. No focal consolidation, pleural effusion, or pneumothorax. The central airways are patent. Upper Abdomen: There is colonic diverticulosis. Left adrenal thickening/ hyperplasia. Musculoskeletal: Mild compression deformity of the inferior endplate of the T3 and superior endplate of the T5, age indeterminate. Clinical correlation is recommended. No retropulsed  fragment identified. The bones are osteopenic. Review of the MIP images confirms the above findings. IMPRESSION: No CT evidence of pulmonary embolism. Advanced emphysema. Mild T3 and T5 compression deformities, age indeterminate, possibly chronic. Clinical correlation is recommended. 81 Electronically Signed   By: Anner Crete M.D.   On: 01/12/2016 03:51   Mr Thoracic Spine W Wo Contrast  Result Date: 01/13/2016 CLINICAL DATA:  82 y/o  F; mid back pain. EXAM: MRI THORACIC SPINE WITH AND WITHOUT CONTRAST TECHNIQUE: Multiplanar, multisequence MR imaging of the thoracic spine was performed. COMPARISON:  01/12/2016 chest CT. FINDINGS: Alignment:  Physiologic. Vertebrae: T3 inferior endplate deformity and T5 superior endplate deformity with minimal edema and enhancement. There is slight retropulsion of the affected endplates without cord impingement or significant canal stenosis. Additionally, there is slight enhancement of the endplates of T4 which does not demonstrate loss of height, possibly bone contusion. The signal pattern is that of degenerative compression deformities. No suspicious osseous lesion. Cord:  Normal signal and morphology.  No abnormal enhancement. Paraspinal and other soft tissues: Right kidney 17 mm cyst. Trace bilateral pleural effusions. 5 mm left kidney upper pole cyst. 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 No significant disc displacement, canal stenosis, or foraminal narrowing. IMPRESSION: Degenerative T3 inferior endplate and T5 superior endplate mild compression deformities demonstrate slight enhancement and edema and are probably acute to subacute. Otherwise no acute abnormality of the thoracic spine is identified. No cord compression or abnormal cord signal. Electronically Signed   By: Kristine Garbe M.D.   On: 01/13/2016 20:02   Dg Chest Portable 1  View  Result Date: 01/12/2016 CLINICAL DATA:  Chest pressure radiating to the left shoulder and back. Shortness of breath. Vomiting. EXAM: PORTABLE CHEST 1 VIEW COMPARISON:  03/23/2015 FINDINGS: Cardiac enlargement. Pulmonary vascularity is normal. Emphysematous changes in the lungs with scattered fibrosis. Peribronchial thickening suggesting chronic bronchitis. Linear atelectasis or scarring in the left lung base. Tortuous aorta. IMPRESSION: Emphysematous changes, chronic bronchitic changes, and fibrosis in the lungs. Atelectasis or scarring in the left lung base. Mild cardiac enlargement. No focal consolidation or edema. Electronically Signed   By: Lucienne Capers M.D.   On: 01/12/2016 02:31      Management plans discussed with the patient and she is in agreement. Stable for discharge home  Patient should follow up with pcp  CODE STATUS:     Code Status Orders        Start     Ordered   01/12/16 1013  Full code  Continuous     01/12/16 1012    Code Status History    Date Active Date Inactive Code Status Order ID Comments User Context   This patient has a current code status but no historical code status.      TOTAL TIME TAKING CARE OF THIS PATIENT: 35 minutes.    Note: This dictation was prepared with Dragon dictation along with smaller phrase technology. Any transcriptional errors that result from this process are unintentional.  Yigit Norkus M.D on 01/13/2016 at 8:57 PM  Between 7am  to 6pm - Pager - [phone] After 6pm go to [email or web address] - password [email or web address]  Office  [phone]  CC: Primary care physician; Wilhemena Durie, MD

## 2016-01-13 NOTE — Progress Notes (Signed)
PT Cancellation Note  Patient Details Name: Mary Howe MRN: [id number] DOB: 09/05/1932   Cancelled Treatment:    Reason Eval/Treat Not Completed: Other (comment).  Consult received.  Chart reviewed.  Pt pending MRI for T-spine (to r/o vertebral fx).  Will hold PT at this time and re-attempt PT eval at a later date/time as medically appropriate.   [name] 01/13/2016, [place], [place]

## 2016-01-13 NOTE — Progress Notes (Signed)
PT Cancellation Note  Patient Details Name: Mary Howe MRN: [id number] DOB: 1932/12/10   Cancelled Treatment:    Reason Eval/Treat Not Completed: Other (comment).  Pt still pending MRI for T-spine (to r/o vertebral fx).  Will hold PT at this time and re-attempt PT eval at a later date/time as medically appropriate.   Raquel Sarna Kylinn [place] 01/13/2016, 4:31 PM [place]a Bleak, [place]

## 2016-01-19 ENCOUNTER — Telehealth: Payer: Self-pay | Admitting: Family Medicine

## 2016-01-19 NOTE — Telephone Encounter (Signed)
Dr. Derrel Nip will take as a new patient. Called pt's daughter Fayne Mediate to see if she could bring pt in on Ocotober 19th at 11:30. This has been ok'd by Dr. Derrel Nip.

## 2016-01-27 ENCOUNTER — Telehealth: Payer: Self-pay | Admitting: Family Medicine

## 2016-01-27 NOTE — Telephone Encounter (Signed)
Pt daughter called to make a new patient appt with Dr. Derrel Nip. The next available appointment is 12/5 @ 1:30. Daughter Raphael Gibney wants to know if this is ok or does she need to come in sooner. Please advise, thank you!  Call [place] [phone] (cell)

## 2016-01-27 NOTE — Telephone Encounter (Signed)
This is suppose to be scheduled to morrow at 11.30

## 2016-01-28 ENCOUNTER — Encounter: Payer: Self-pay | Admitting: Internal Medicine

## 2016-01-28 ENCOUNTER — Ambulatory Visit (INDEPENDENT_AMBULATORY_CARE_PROVIDER_SITE_OTHER): Payer: Medicare Other | Admitting: Internal Medicine

## 2016-01-28 VITALS — BP 168/90 | HR 98 | Temp 97.6°F | Resp 12 | Ht 63.0 in | Wt 136.8 lb

## 2016-01-28 DIAGNOSIS — R030 Elevated blood-pressure reading, without diagnosis of hypertension: Secondary | ICD-10-CM | POA: Diagnosis not present

## 2016-01-28 DIAGNOSIS — E0500 Thyrotoxicosis with diffuse goiter without thyrotoxic crisis or storm: Secondary | ICD-10-CM

## 2016-01-28 DIAGNOSIS — Z09 Encounter for follow-up examination after completed treatment for conditions other than malignant neoplasm: Secondary | ICD-10-CM

## 2016-01-28 DIAGNOSIS — Z8639 Personal history of other endocrine, nutritional and metabolic disease: Secondary | ICD-10-CM

## 2016-01-28 DIAGNOSIS — E785 Hyperlipidemia, unspecified: Secondary | ICD-10-CM | POA: Diagnosis not present

## 2016-01-28 DIAGNOSIS — Z79899 Other long term (current) drug therapy: Secondary | ICD-10-CM | POA: Diagnosis not present

## 2016-01-28 DIAGNOSIS — E559 Vitamin D deficiency, unspecified: Secondary | ICD-10-CM | POA: Diagnosis not present

## 2016-01-28 DIAGNOSIS — J431 Panlobular emphysema: Secondary | ICD-10-CM

## 2016-01-28 DIAGNOSIS — Z8619 Personal history of other infectious and parasitic diseases: Secondary | ICD-10-CM

## 2016-01-28 DIAGNOSIS — Z8781 Personal history of (healed) traumatic fracture: Secondary | ICD-10-CM

## 2016-01-28 DIAGNOSIS — E7800 Pure hypercholesterolemia, unspecified: Secondary | ICD-10-CM

## 2016-01-28 DIAGNOSIS — Z780 Asymptomatic menopausal state: Secondary | ICD-10-CM

## 2016-01-28 LAB — COMPREHENSIVE METABOLIC PANEL
ALT: 22 U/L (ref 0–35)
AST: 25 U/L (ref 0–37)
Albumin: 4.7 g/dL (ref 3.5–5.2)
Alkaline Phosphatase: 86 U/L (ref 39–117)
BILIRUBIN TOTAL: 0.6 mg/dL (ref 0.2–1.2)
BUN: 10 mg/dL (ref 6–23)
CO2: 30 meq/L (ref 19–32)
CREATININE: 0.55 mg/dL (ref 0.40–1.20)
Calcium: 10.2 mg/dL (ref 8.4–10.5)
Chloride: 104 mEq/L (ref 96–112)
GFR: 112.64 mL/min (ref >60.00)
GLUCOSE: 101 mg/dL — AB (ref 70–99)
Potassium: 5 mEq/L (ref 3.5–5.1)
Sodium: 142 mEq/L (ref 135–145)
Total Protein: 7.9 g/dL (ref 6.0–8.3)

## 2016-01-28 LAB — VITAMIN D 25 HYDROXY (VIT D DEFICIENCY, FRACTURES): VITD: 41.28 ng/mL (ref 30.00–100.00)

## 2016-01-28 LAB — LIPID PANEL
CHOL/HDL RATIO: 2
Cholesterol: 183 mg/dL (ref 0–200)
HDL: 84.2 mg/dL (ref >39.00)
LDL Cholesterol: 78 mg/dL (ref 0–99)
NONHDL: 98.83
Triglycerides: 105 mg/dL (ref 0.0–149.0)
VLDL: 21 mg/dL (ref 0.0–40.0)

## 2016-01-28 LAB — MICROALBUMIN / CREATININE URINE RATIO
Creatinine,U: 10.3 mg/dL
Microalb Creat Ratio: 6.8 mg/g (ref 0.0–30.0)
Microalb, Ur: <0.7 mg/dL (ref 0.0–1.9)

## 2016-01-28 LAB — TSH: TSH: 4.38 u[IU]/mL (ref 0.35–4.50)

## 2016-01-28 NOTE — Progress Notes (Signed)
Pre-visit discussion using our clinic review tool. No additional management support is needed unless otherwise documented below in the visit note.  

## 2016-01-28 NOTE — Patient Instructions (Addendum)
I would Reduce the meloxicam to 7.5 mg once a day to protect your kidneys .  Do not take motrin or Aleve with it.   It is Ok to increase the tylenol to a total of 200 mg daily (every 24 hours) so you can take 1000 mg ewery 12 hours,  Or 650 mg every 8 hours   You can use the Lidex cream on your abdominal tick bite to calm it down.   Your blood pressure  Has been elevated the past two times it ha been measured Please check it at home or at the drug store 3 times over the next 2 weeks and let me know what readings your get    I am ordering pulmonary function tests  And a bone density test over the next several weeks

## 2016-01-28 NOTE — Progress Notes (Signed)
Subjective:  Patient ID: Mary Howe, female    DOB: [birth year]  Age: 82 y.o. MRN: [id number]  CC: The primary encounter diagnosis was Vitamin D deficiency. Diagnoses of Long-term use of high-risk medication, Hyperlipidemia LDL goal <130, Elevated blood pressure reading, Postmenopausal estrogen deficiency, Panlobular emphysema ([place]), Basedow disease, History of shingles, History of vertebral compression fracture, History of Graves' disease, Hypercholesterolemia, and Hospital discharge follow-up were also pertinent to this visit.  HPI Mary Howe presents for establishment of care . Recently admitted to [facility] overnight on Oct 3 with hypoxia and back pain ,  Imaging noted mid thoracic subacute vertebral fractures of T3 and T5, and chest CT was negative for PE, but notable for atherosclerosis , mild cardiomegaly and advanced emphysematous changes to lungs.  Since discharge home she has been using incentive spirometry and notes that she can pull 1700 ml   She has a long history of tobacco abuse which  started at age 80 and spanned  50 years Of a pack daily. No prior workup or diagnosis of of COPD .  Was walking  1.5 miles daily until this summer,  But still physically active with outdoor  [place]. Lives in a  two  Story house and does not feel very short of breath when she reaches the top .  Started the walking program again last week. Discussed getting PFTS   Feels her voice has become raspy.  No one else has noticed,   No chronic cough.  No orthopnea..  Does not clear throat a lot,  Denies dysphagia. Gets Bronchitis 1-2 times per year.    Hypertension:  No history,  But old medical records reviewed with patient  today show that her office readings priro to relocating to [place] were around 144/80  Drinks 2 small glasses of wine per night  Vertebral fractures: her pain has resolved.  She has NO recent  DEXA . , reviewe of records notes  osteopenia by last one in 2013   mammogram due  Spider veins   Discussed    History of shingles affecteing left side of torso a  .    History of Graves Disease many years ago,  Took PTU refused RA 131    History.  MRI spine noted  Mary Howe has a past medical history of Diverticulitis (2015); GERD (gastroesophageal reflux disease); Hyperlipidemia; and Thyroid disease.   She has a past surgical history that includes Cholecystectomy; Hemorroidectomy; and Cervical cone biopsy.   Her family history includes Aneurysm in her sister; Bone cancer in her sister; Brain cancer in her brother; CAD in her brother and brother; Emphysema in her sister; Fibrocystic breast disease in her sister; Heart attack in her brother; Heart disease in her brother and sister; Lung disease in her brother; Pneumonia in her brother; Prostate cancer in her father.She reports that she has quit smoking. She has never used smokeless tobacco. She reports that she drinks alcohol. She reports that she does not use drugs.  Outpatient Medications Prior to Visit  Medication Sig Dispense Refill  . calcium carbonate (OS-CAL) 600 MG TABS tablet Take 600 mg by mouth daily with breakfast.     . fluocinonide cream (LIDEX) 0.05 % Apply 3 x day to back rash 30 g 0  . ibuprofen (ADVIL,MOTRIN) 800 MG tablet TAKE ONE TABLET BY MOUTH TWICE DAILY AS NEEDED 180 tablet 0  . meloxicam (MOBIC) 7.5 MG tablet Take 7.5 mg by mouth as needed for pain.    Marland Kitchen MULTIPLE VITAMIN PO Take  1 tablet by mouth daily.     . pantoprazole (PROTONIX) 40 MG tablet Take 1 tablet (40 mg total) by mouth daily. 90 tablet 3  . simvastatin (ZOCOR) 40 MG tablet Take 1 tablet (40 mg total) by mouth at bedtime. 90 tablet 3   No facility-administered medications prior to visit.     Review of Systems:  Patient denies headache, fevers, malaise, unintentional weight loss, skin rash, eye pain, sinus congestion and sinus pain, sore throat, dysphagia,  hemoptysis , cough, dyspnea, wheezing, chest pain, palpitations, orthopnea, edema, abdominal  pain, nausea, melena, diarrhea, constipation, flank pain, dysuria, hematuria, urinary  Frequency, nocturia, numbness, tingling, seizures,  Focal weakness, Loss of consciousness,  Tremor, insomnia, depression, anxiety, and suicidal ideation.     Objective:  BP (!) 168/90   Pulse 98   Temp 97.6 F (36.4 C) (Oral)   Resp 12   Ht 5\' 3"  (1.6 m)   Wt 136 lb 12 oz (62 kg)   SpO2 98%   BMI 24.22 kg/m   Physical Exam:  General appearance: alert, cooperative and appears stated age Head: Normocephalic, without obvious abnormality, atraumatic Eyes: conjunctivae/corneas clear. PERRL, EOM's intact. Fundi benign. Ears: normal TM's and external ear canals both ears Nose: Nares normal. Septum midline. Mucosa normal. No drainage or sinus tenderness. Throat: lips, mucosa, and tongue normal; teeth and gums normal Neck: no adenopathy, no carotid bruit, no JVD, supple, symmetrical, trachea midline and thyroid not enlarged, symmetric, no tenderness/mass/nodules Lungs: clear to auscultation bilaterally Breasts: normal appearance, no masses or tenderness Heart: regular rate and rhythm, S1, S2 normal, no murmur, click, rub or gallop Abdomen: soft, non-tender; bowel sounds normal; no masses,  no organomegaly Extremities: extremities normal, atraumatic, no cyanosis or edema Pulses: 2+ and symmetric Skin: Skin color, texture, turgor normal. No rashes or lesions Neurologic: Alert and oriented X 3, normal strength and tone. Normal symmetric reflexes. Normal coordination and gait.    Assessment & Plan:   Problem List Items Addressed This Visit    Basedow disease   Hypercholesterolemia    Managed with  statin therapy.   Liver enzymes are normal , no changes today.  Lab Results  Component Value Date   CHOL 183 01/28/2016   HDL 84.20 01/28/2016   LDLCALC 78 01/28/2016   TRIG 105.0 01/28/2016   CHOLHDL 2 01/28/2016   Lab Results  Component Value Date   ALT 22 01/28/2016   AST 25 01/28/2016   ALKPHOS  86 01/28/2016   BILITOT 0.6 01/28/2016         History of shingles    Presumed by history  August 2017      History of vertebral compression fracture    T3 amd T5,  By recent CT. Severe Pain resolved, needs DEXA scan . Redcuing meloxicam dose to 7.5 mg daily given  her age,  And adding tylenol       Panlobular emphysema ([place])    Advanced, with fibrotic changes, noted on CT angio during recent admission.  She is asymptomatic with ADLs and quit smoking over 20 years ago .  Ordering PFTs to assess lung function       Relevant Orders   Pulmonary Function Test [facility] Only   Elevated blood pressure reading    She has no history of hypertension but has had several elevated readings.  She has been asked to reduce her dose of meloxicam to 7.5 mg daily check her pressures at home and submit readings for evaluation. Renal function will  be checked today  Lab Results  Component Value Date   CREATININE 0.55 01/28/2016   Lab Results  Component Value Date   NA 142 01/28/2016   K 5.0 01/28/2016   CL 104 01/28/2016   CO2 30 01/28/2016         Relevant Orders   Microalbumin / creatinine urine ratio (Completed)   History of Graves' disease    Treated without RAI 131 remotely.Marland Kitchen TSH is normal today  Lab Results  Component Value Date   TSH 4.38 01/28/2016         Hospital discharge follow-up    Patient is stable post discharge and has no new issues or questions about discharge plans at the visit today for hospital follow up.  I have reviewed the records from the hospital admission in detail with patient today.       Other Visit Diagnoses    Vitamin D deficiency    -  Primary   Relevant Orders   VITAMIN D 25 Hydroxy (Vit-D Deficiency, Fractures) (Completed)   Long-term use of high-risk medication       Relevant Orders   Comprehensive metabolic panel (Completed)   Hyperlipidemia LDL goal <130       Relevant Orders   TSH (Completed)   Lipid panel (Completed)   Postmenopausal  estrogen deficiency       Relevant Orders   DG Bone Density      I am having Ms. Mary Howe maintain her meloxicam, ibuprofen, MULTIPLE VITAMIN PO, calcium carbonate, simvastatin, pantoprazole, fluocinonide cream, and valACYclovir.  Meds ordered this encounter  Medications  . valACYclovir (VALTREX) 1000 MG tablet    Sig: Take 1,000 mg by mouth 2 (two) times daily.     There are no discontinued medications.  Follow-up: Return in about 3 months (around 04/29/2016).   [name], MD

## 2016-01-29 ENCOUNTER — Encounter: Payer: Self-pay | Admitting: Internal Medicine

## 2016-01-30 DIAGNOSIS — M810 Age-related osteoporosis without current pathological fracture: Secondary | ICD-10-CM | POA: Insufficient documentation

## 2016-01-30 DIAGNOSIS — B029 Zoster without complications: Secondary | ICD-10-CM

## 2016-01-30 DIAGNOSIS — M8008XA Age-related osteoporosis with current pathological fracture, vertebra(e), initial encounter for fracture: Secondary | ICD-10-CM | POA: Insufficient documentation

## 2016-01-30 DIAGNOSIS — Z8619 Personal history of other infectious and parasitic diseases: Secondary | ICD-10-CM | POA: Insufficient documentation

## 2016-01-30 DIAGNOSIS — I10 Essential (primary) hypertension: Secondary | ICD-10-CM | POA: Insufficient documentation

## 2016-01-30 DIAGNOSIS — Z09 Encounter for follow-up examination after completed treatment for conditions other than malignant neoplasm: Secondary | ICD-10-CM | POA: Insufficient documentation

## 2016-01-30 DIAGNOSIS — Z8639 Personal history of other endocrine, nutritional and metabolic disease: Secondary | ICD-10-CM | POA: Insufficient documentation

## 2016-01-30 DIAGNOSIS — J431 Panlobular emphysema: Secondary | ICD-10-CM | POA: Insufficient documentation

## 2016-01-30 HISTORY — DX: Zoster without complications: B02.9

## 2016-01-30 NOTE — Assessment & Plan Note (Signed)
She has no history of hypertension but has had several elevated readings.  She has been asked to reduce her dose of meloxicam to 7.5 mg daily check her pressures at home and submit readings for evaluation. Renal function will be checked today  Lab Results  Component Value Date   CREATININE 0.55 01/28/2016   Lab Results  Component Value Date   NA 142 01/28/2016   K 5.0 01/28/2016   CL 104 01/28/2016   CO2 30 01/28/2016

## 2016-01-30 NOTE — Assessment & Plan Note (Addendum)
T3 amd T5,  By recent CT. Severe Pain resolved, needs DEXA scan . Redcuing meloxicam dose to 7.5 mg daily given  her age,  And adding tylenol

## 2016-01-30 NOTE — Assessment & Plan Note (Signed)
Presumed by history  August 2017

## 2016-01-30 NOTE — Assessment & Plan Note (Signed)
Treated without RAI 131 remotely.Marland Kitchen TSH is normal today  Lab Results  Component Value Date   TSH 4.38 01/28/2016

## 2016-01-30 NOTE — Assessment & Plan Note (Signed)
Patient is stable post discharge and has no new issues or questions about discharge plans at the visit today for hospital follow up.  I have reviewed the records from the hospital admission in detail with patient today. 

## 2016-01-30 NOTE — Assessment & Plan Note (Addendum)
Advanced, with fibrotic changes, noted on CT angio during recent admission.  She is asymptomatic with ADLs and quit smoking over 20 years ago .  Ordering PFTs to assess lung function

## 2016-01-30 NOTE — Assessment & Plan Note (Signed)
Managed with  statin therapy.   Liver enzymes are normal , no changes today.  Lab Results  Component Value Date   CHOL 183 01/28/2016   HDL 84.20 01/28/2016   LDLCALC 78 01/28/2016   TRIG 105.0 01/28/2016   CHOLHDL 2 01/28/2016   Lab Results  Component Value Date   ALT 22 01/28/2016   AST 25 01/28/2016   ALKPHOS 86 01/28/2016   BILITOT 0.6 01/28/2016

## 2016-01-31 ENCOUNTER — Encounter: Payer: Self-pay | Admitting: Internal Medicine

## 2016-02-03 ENCOUNTER — Telehealth: Payer: Self-pay | Admitting: Internal Medicine

## 2016-02-03 MED ORDER — MELOXICAM 7.5 MG PO TABS: 7.5000 mg | ORAL_TABLET | ORAL | 3 refills | Status: DC | PRN

## 2016-02-03 NOTE — Telephone Encounter (Signed)
Last OV 01/28/16 ok to fill meloxicam last labs with cmet 01/28/16?

## 2016-02-03 NOTE — Telephone Encounter (Signed)
Pt would like to have her meloxicam (MOBIC) 7.5 MG tablet refilled. Please call into [facility] on [address].   Thanks.

## 2016-02-04 ENCOUNTER — Ambulatory Visit: Payer: Medicare Other | Attending: Internal Medicine

## 2016-02-04 DIAGNOSIS — J431 Panlobular emphysema: Secondary | ICD-10-CM | POA: Diagnosis not present

## 2016-02-04 DIAGNOSIS — J449 Chronic obstructive pulmonary disease, unspecified: Secondary | ICD-10-CM | POA: Diagnosis not present

## 2016-02-05 ENCOUNTER — Other Ambulatory Visit: Payer: Self-pay | Admitting: Internal Medicine

## 2016-02-05 ENCOUNTER — Other Ambulatory Visit: Payer: Self-pay | Admitting: Family Medicine

## 2016-02-05 DIAGNOSIS — Z1231 Encounter for screening mammogram for malignant neoplasm of breast: Secondary | ICD-10-CM

## 2016-02-06 ENCOUNTER — Other Ambulatory Visit: Payer: Self-pay | Admitting: Internal Medicine

## 2016-02-06 DIAGNOSIS — J449 Chronic obstructive pulmonary disease, unspecified: Secondary | ICD-10-CM

## 2016-02-06 NOTE — Progress Notes (Signed)
b ref to pulm

## 2016-02-16 NOTE — Telephone Encounter (Signed)
Mailed unread message to patient.  

## 2016-02-17 ENCOUNTER — Encounter: Payer: Self-pay | Admitting: Internal Medicine

## 2016-02-23 ENCOUNTER — Ambulatory Visit
Admission: RE | Admit: 2016-02-23 | Discharge: 2016-02-23 | Disposition: A | Discharge status: Home / Self Care | Payer: Medicare Other | Source: Ambulatory Visit | Attending: Internal Medicine | Admitting: Internal Medicine

## 2016-02-23 ENCOUNTER — Other Ambulatory Visit: Payer: Self-pay | Admitting: Internal Medicine

## 2016-02-23 DIAGNOSIS — N6489 Other specified disorders of breast: Secondary | ICD-10-CM

## 2016-02-23 DIAGNOSIS — M8589 Other specified disorders of bone density and structure, multiple sites: Secondary | ICD-10-CM | POA: Insufficient documentation

## 2016-02-23 DIAGNOSIS — Z1231 Encounter for screening mammogram for malignant neoplasm of breast: Secondary | ICD-10-CM

## 2016-02-23 DIAGNOSIS — M8588 Other specified disorders of bone density and structure, other site: Secondary | ICD-10-CM | POA: Diagnosis not present

## 2016-02-23 DIAGNOSIS — Z780 Asymptomatic menopausal state: Secondary | ICD-10-CM | POA: Diagnosis not present

## 2016-02-27 ENCOUNTER — Encounter: Payer: Self-pay | Admitting: Internal Medicine

## 2016-03-01 ENCOUNTER — Encounter: Payer: Self-pay | Admitting: Family Medicine

## 2016-03-07 ENCOUNTER — Ambulatory Visit (INDEPENDENT_AMBULATORY_CARE_PROVIDER_SITE_OTHER): Payer: Medicare Other | Admitting: Pulmonary Disease

## 2016-03-07 ENCOUNTER — Encounter: Payer: Self-pay | Admitting: Pulmonary Disease

## 2016-03-07 VITALS — BP 120/70 | HR 87 | Ht 63.0 in | Wt 133.8 lb

## 2016-03-07 DIAGNOSIS — J449 Chronic obstructive pulmonary disease, unspecified: Secondary | ICD-10-CM | POA: Diagnosis not present

## 2016-03-07 DIAGNOSIS — J439 Emphysema, unspecified: Secondary | ICD-10-CM

## 2016-03-07 MED ORDER — UMECLIDINIUM-VILANTEROL 62.5-25 MCG/INH IN AEPB: 1.0000 | INHALATION_SPRAY | Freq: Every day | RESPIRATORY_TRACT | 5 refills | Status: DC

## 2016-03-07 MED ORDER — UMECLIDINIUM-VILANTEROL 62.5-25 MCG/INH IN AEPB: 1.0000 | INHALATION_SPRAY | Freq: Every day | RESPIRATORY_TRACT | 0 refills | Status: AC

## 2016-03-07 NOTE — Progress Notes (Signed)
PULMONARY CONSULT NOTE  Requesting MD/Service: Derrel Nip Date of initial consultation: 03/07/16 Reason for consultation: Emphysema  PT PROFILE: 27 F former smoker referred for evaluation of recent CT chest revealing emphysema and recent PFTs  DATA: CXR (01/13/16):  Hyperinflation, NACPD CT chest (01/12/16): severe B upper lobe emphysema PFTs (02/04/16): very mild obstruction (FEV1 1.6 liters, 97% pred, FEV1/FVC 61%, flow-vol curve c/w obstruction), Normal lung volumes, mild decrease in DLCO  HPI:  17 F former smoker (1-1.5 PPD X 30 yrs, quit 1990) who was briefly hospitalized 10/03-10/04/17 for back pain. In the ED, she was noted to be hypoxic and underwent CTA chest revealing emphysema. She has seen Dr Derrel Nip in follow up and underwent PFTs which are discussed below. She is referred for further evaluation. She denies significant DOE and walks on a regular basis. She is able to cover 1.5 miles in 35 minutes. She rarely has a "raspy" cough which is minimally productive. Denies CP, fever, purulent sputum, hemoptysis, LE edema and calf tenderness.   Past Medical History:  Diagnosis Date  . Diverticulitis 2015  . GERD (gastroesophageal reflux disease)   . Hyperlipidemia   . Thyroid disease     Past Surgical History:  Procedure Laterality Date  . CERVICAL CONE BIOPSY    . CHOLECYSTECTOMY    . HEMORROIDECTOMY      MEDICATIONS: I have reviewed all medications and confirmed regimen as documented  Social History   Social History  . Marital status: Divorced    Spouse name: N/A  . Number of children: N/A  . Years of education: N/A   Occupational History  . retired    Social History Main Topics  . Smoking status: Former Research scientist (life sciences)  . Smokeless tobacco: Never Used     Comment: quit smoking in 1990  . Alcohol use 0.0 oz/week     Comment: OCCASIONALLY DRINKS WINE  . Drug use: No  . Sexual activity: Not on file   Other Topics Concern  . Not on file   Social History Narrative   **  Merged History Encounter **        Family History  Problem Relation Age of Onset  . Prostate cancer Father   . Pneumonia Brother   . Lung disease Brother   . CAD Brother   . Heart disease Brother   . CAD Brother   . Heart attack Brother   . Brain cancer Brother   . Bone cancer Sister   . Emphysema Sister   . Fibrocystic breast disease Sister   . Aneurysm Sister   . Heart disease Sister     ROS: No fever, myalgias/arthralgias, unexplained weight loss or weight gain No new focal weakness or sensory deficits No otalgia, hearing loss, visual changes, nasal and sinus symptoms, mouth and throat problems No neck pain or adenopathy No abdominal pain, N/V/D, diarrhea, change in bowel pattern No dysuria, change in urinary pattern   Vitals:   03/07/16 1105  BP: 120/70  Pulse: 87  SpO2: 96%  Weight: 133 lb 12.8 oz (60.7 kg)  Height: 5\' 3"  (1.6 m)     EXAM:  Gen: WDWN, No overt respiratory distress HEENT: NCAT, sclera white, oropharynx normal Neck: Supple without LAN, thyromegaly, JVD Lungs: breath sounds full, percussion normal, No adventitious sounds Cardiovascular: RRR, no murmurs noted Abdomen: Soft, nontender, normal BS Ext: without clubbing, cyanosis, edema Neuro: CNs grossly intact, motor and sensory intact Skin: Limited exam, no lesions noted  DATA:   BMP Latest Ref Rng & Units  01/28/2016 01/13/2016 01/12/2016  Glucose 70 - 99 mg/dL 101(H) 113(H) 153(H)  BUN 6 - 23 mg/dL 10 10 15   Creatinine 0.40 - 1.20 mg/dL 0.55 0.48 0.64  BUN/Creat Ratio 11 - 26 - - -  Sodium 135 - 145 mEq/L 142 140 130(L)  Potassium 3.5 - 5.1 mEq/L 5.0 3.4(L) 3.6  Chloride 96 - 112 mEq/L 104 112(H) 98(L)  CO2 19 - 32 mEq/L 30 26 25   Calcium 8.4 - 10.5 mg/dL 10.2 8.3(L) 8.9    CBC Latest Ref Rng & Units 01/13/2016 01/12/2016 03/23/2015  WBC 3.6 - 11.0 K/uL 8.3 12.4(H) 5.9  Hemoglobin 12.0 - 16.0 g/dL 12.9 14.9 -  Hematocrit 35.0 - 47.0 % 36.6 44.0 43.3  Platelets 150 - 440 K/uL 186 233  246    CXR (01/13/16):  Hyperinflation, NACPD CT chest (01/12/16): severe B upper lobe emphysema PFTs (02/04/16): very mild obstruction (FEV1 1.6 liters, 97% pred, FEV1/FVC 61%, flow-vol curve c/w obstruction), Normal lung volumes, mild decrease in DLCO  IMPRESSION:   1) Former smoker 2) severe emphysema by CT scan of chest 3) Very mild obstruction by PFTs 4) Minimal to no DOE - it is possible that she has adjusted her activity level to minimize symptoms but she seems rather active for a woman of her age   PLAN:  1) Trial of Anoro inhaler - samples provided and rx entered 2) Follow up in 6-8 weeks   Merton Border, MD PCCM service Mobile [phone] Pager [phone] 03/07/2016

## 2016-03-07 NOTE — Patient Instructions (Signed)
You have emphysema on CT scan of your chest You have very mild COPD by lung function tests We will try an inhaler medicine called Anoro to see if this helps any of your (mild) sypmtoms  Follow up in 6-8 weeks

## 2016-03-07 NOTE — Progress Notes (Signed)
Patient ID: Mary Howe, female   DOB: [birth year], 82 y.o.   MRN: [id number] Patient seen in the office today and instructed on use of anoro ellipta.  Patient expressed understanding and demonstrated technique.

## 2016-03-10 ENCOUNTER — Ambulatory Visit
Admission: RE | Admit: 2016-03-10 | Discharge: 2016-03-10 | Disposition: A | Discharge status: Home / Self Care | Payer: Medicare Other | Source: Ambulatory Visit | Attending: Internal Medicine | Admitting: Internal Medicine

## 2016-03-10 DIAGNOSIS — N6489 Other specified disorders of breast: Secondary | ICD-10-CM | POA: Diagnosis not present

## 2016-03-10 DIAGNOSIS — R928 Other abnormal and inconclusive findings on diagnostic imaging of breast: Secondary | ICD-10-CM | POA: Diagnosis not present

## 2016-03-14 NOTE — Telephone Encounter (Signed)
Mailed unread message to patient.  

## 2016-03-29 ENCOUNTER — Telehealth: Payer: Self-pay | Admitting: Pulmonary Disease

## 2016-03-29 MED ORDER — UMECLIDINIUM-VILANTEROL 62.5-25 MCG/INH IN AEPB: 1.0000 | INHALATION_SPRAY | Freq: Every day | RESPIRATORY_TRACT | 0 refills | Status: AC

## 2016-03-29 NOTE — Telephone Encounter (Signed)
Spoke with pt daughter, who request a sample of anoro, as anoro is not covered by pt insurance. I have advised pt's daughter to contact pt's insurance company and ask for a formulary, and contact once she has received it.  Pt's daughter voiced understanding and had no further questions. Nothing further needed.

## 2016-03-29 NOTE — Telephone Encounter (Signed)
Patient calling the office for samples of medication:   1.  What medication and dosage are you requesting samples for? Anoro 62.5-25 mcg inhale 1 puff daily   2.  Are you currently out of this medication?  Almost out

## 2016-04-15 ENCOUNTER — Ambulatory Visit (INDEPENDENT_AMBULATORY_CARE_PROVIDER_SITE_OTHER): Payer: Medicare Other | Admitting: Pulmonary Disease

## 2016-04-15 ENCOUNTER — Encounter: Payer: Self-pay | Admitting: Pulmonary Disease

## 2016-04-15 VITALS — BP 140/88 | HR 105 | Wt 133.0 lb

## 2016-04-15 DIAGNOSIS — J449 Chronic obstructive pulmonary disease, unspecified: Secondary | ICD-10-CM

## 2016-04-15 DIAGNOSIS — J439 Emphysema, unspecified: Secondary | ICD-10-CM

## 2016-04-15 DIAGNOSIS — Z87891 Personal history of nicotine dependence: Secondary | ICD-10-CM | POA: Diagnosis not present

## 2016-04-15 DIAGNOSIS — R0609 Other forms of dyspnea: Secondary | ICD-10-CM | POA: Diagnosis not present

## 2016-04-15 MED ORDER — IPRATROPIUM-ALBUTEROL 20-100 MCG/ACT IN AERS: 1.0000 | INHALATION_SPRAY | Freq: Four times a day (QID) | RESPIRATORY_TRACT | 10 refills | Status: DC | PRN

## 2016-04-15 NOTE — Patient Instructions (Addendum)
Stop Anoro inhaler (too expensive and limited or no benefit)  I have prescribed Combivent inhaler to be used as needed (as much or as little as you want to use it to the extent that you feel like it helps your breathing) - Try it before your do your daily walk or when you feel more short of breath than normal  Follow up as needed

## 2016-04-15 NOTE — Progress Notes (Signed)
PULMONARY OFFICE FOLLOW UP NOTE  Requesting MD/Service: Derrel Nip Date of initial consultation: 03/07/16 Reason for consultation: Emphysema  PT PROFILE: 66 F former smoker referred for evaluation of recent CT chest revealing emphysema and recent PFTs  DATA: CXR (01/13/16):  Hyperinflation, NACPD CT chest (01/12/16): severe B upper lobe emphysema PFTs (02/04/16): very mild obstruction (FEV1 1.6 liters, 97% pred, FEV1/FVC 61%, flow-vol curve c/w obstruction), Normal lung volumes, mild decrease in DLCO  SUBJ:  Here to review response to trial of Anoro. She thinks it was beneficial with modest improvement in DOE. However the cost of it is prohibitive and she has not refilled it. No new complaints. Denies CP, fever, purulent sputum, hemoptysis, LE edema and calf tenderness  OBJ:  Vitals:   04/15/16 1049  BP: 140/88  Pulse: (!) 105  SpO2: 95%  Weight: 133 lb (60.3 kg)    Gen: WDWN in NAD HEENT: All WNL Neck: NO LAN, no JVD noted Lungs: full BS, normal percussion note throughout, no adventitious sounds Cardiovascular: Reg rate, normal rhythm, no M noted Abdomen: Soft, NT +BS Ext: no C/C/E Neuro: CNs intact, motor/sens grossly intact Skin: No lesions noted   EXAM:  Gen: WDWN, NAD HEENT: WNL Lungs: No wheezes Cardiovascular: Reg, no M Abdomen: Soft, nontender, normal BS Ext: no C/C/E Neuro: CNs grossly intact, motor and sensory intact  DATA:   BMP Latest Ref Rng & Units 01/28/2016 01/13/2016 01/12/2016  Glucose 70 - 99 mg/dL 101(H) 113(H) 153(H)  BUN 6 - 23 mg/dL 10 10 15   Creatinine 0.40 - 1.20 mg/dL 0.55 0.48 0.64  BUN/Creat Ratio 11 - 26 - - -  Sodium 135 - 145 mEq/L 142 140 130(L)  Potassium 3.5 - 5.1 mEq/L 5.0 3.4(L) 3.6  Chloride 96 - 112 mEq/L 104 112(H) 98(L)  CO2 19 - 32 mEq/L 30 26 25   Calcium 8.4 - 10.5 mg/dL 10.2 8.3(L) 8.9    CBC Latest Ref Rng & Units 01/13/2016 01/12/2016 03/23/2015  WBC 3.6 - 11.0 K/uL 8.3 12.4(H) 5.9  Hemoglobin 12.0 - 16.0 g/dL 12.9  14.9 -  Hematocrit 35.0 - 47.0 % 36.6 44.0 43.3  Platelets 150 - 440 K/uL 186 233 246    DATA: No new CXR or PFTs  IMPRESSION:   1) Former smoker 2) severe emphysema by CT scan of chest 3) Mild obstruction by PFTs 4) Mild DOE - modestly improved with Anoro but cost of medication is prohibitive  PLAN:  1) DC Anoro 2) I have prescribed Combivent MDI to be used PRN and I have suggested that she use it 5-10 minutes prior to her daily walks 3) Follow up PRN   Merton Border, MD PCCM service Mobile [phone] Pager [phone] 04/21/2016  ADD: The Combivent was also too costly. I have prescribed PRN albuterol MDI  Merton Border, MD PCCM service Mobile [phone] Pager [phone] 04/21/2016

## 2016-04-20 ENCOUNTER — Telehealth: Payer: Self-pay | Admitting: *Deleted

## 2016-04-20 MED ORDER — ALBUTEROL SULFATE HFA 108 (90 BASE) MCG/ACT IN AERS: 1.0000 | INHALATION_SPRAY | RESPIRATORY_TRACT | 10 refills | Status: DC | PRN

## 2016-04-20 NOTE — Telephone Encounter (Signed)
Daughter has called and states the Combivent is $370 with insurance. I called pharmacy and asked them what is covered. Pharmacy states there is nothing in its place. Please advise on a different medication.

## 2016-04-20 NOTE — Telephone Encounter (Signed)
Informed daughter. Nothing further needed.

## 2016-04-20 NOTE — Telephone Encounter (Signed)
The only other thing I can think of is PRN albuterol. Dispense one inhaler, 1-2 sprays q 4 hrs PRN. Order has been entered   [place]

## 2016-05-04 ENCOUNTER — Encounter: Payer: Self-pay | Admitting: Internal Medicine

## 2016-05-04 ENCOUNTER — Ambulatory Visit (INDEPENDENT_AMBULATORY_CARE_PROVIDER_SITE_OTHER): Payer: Medicare Other | Admitting: Internal Medicine

## 2016-05-04 VITALS — BP 148/84 | HR 92 | Temp 97.5°F | Resp 16 | Ht 63.0 in | Wt 134.5 lb

## 2016-05-04 DIAGNOSIS — J449 Chronic obstructive pulmonary disease, unspecified: Secondary | ICD-10-CM

## 2016-05-04 DIAGNOSIS — R030 Elevated blood-pressure reading, without diagnosis of hypertension: Secondary | ICD-10-CM | POA: Diagnosis not present

## 2016-05-04 DIAGNOSIS — Z23 Encounter for immunization: Secondary | ICD-10-CM | POA: Diagnosis not present

## 2016-05-04 DIAGNOSIS — R0982 Postnasal drip: Secondary | ICD-10-CM

## 2016-05-04 NOTE — Progress Notes (Signed)
Subjective:  Patient ID: Mary Howe, female    DOB: April 17, 1932  Age: 83 y.o. MRN: [id number]  CC: The primary encounter diagnosis was Need for prophylactic vaccination against Streptococcus pneumoniae (pneumococcus). Diagnoses of Post-nasal drip, Elevated blood pressure reading, and Chronic obstructive pulmonary disease, unspecified COPD type ([place]) were also pertinent to this visit.  HPI [name] presents for follow up on COPD, hypertension.  She was referred to Dr. Alva Garnet, who prescribed  Anoro which helped minimally and was cost prohibitive.  She also found Combivent to be unaffordable as well. She has an albuterol MDI and  Notes that her symptoms of dyspnea are most prominent in early am and when walking up the stairs .  She continues to be bothered by persistent throat clearing  Which is more bothersome  in the morning.    Osteoporosis:  Recent chest CT noted T3 and  t5 vertebral compressions on  in October. She has a history of using fosomax for treatment of osteopenia for  4-5 years ,  But has not taken it in several years . She has no history of thoracic back pain until last fall     Bothered by left foot pain midfoot dorsal surface, aggravated by walking .     Discussed prolia   Records from [place]    Outpatient Medications Prior to Visit  Medication Sig Dispense Refill  . albuterol (PROVENTIL HFA;VENTOLIN HFA) 108 (90 Base) MCG/ACT inhaler Inhale 1-2 puffs into the lungs every 4 (four) hours as needed for wheezing or shortness of breath. 1 Inhaler 10  . calcium carbonate (OS-CAL) 600 MG TABS tablet Take 600 mg by mouth daily with breakfast.     . fluocinonide cream (LIDEX) 0.05 % Apply 3 x day to back rash 30 g 0  . Ipratropium-Albuterol (COMBIVENT RESPIMAT) 20-100 MCG/ACT AERS respimat Inhale 1 puff into the lungs every 6 (six) hours as needed for wheezing. 1 Inhaler 10  . MULTIPLE VITAMIN PO Take 1 tablet by mouth daily.     . pantoprazole (PROTONIX) 40 MG tablet Take 1  tablet (40 mg total) by mouth daily. 90 tablet 3  . simvastatin (ZOCOR) 40 MG tablet Take 1 tablet (40 mg total) by mouth at bedtime. 90 tablet 3  . valACYclovir (VALTREX) 1000 MG tablet Take 1,000 mg by mouth 2 (two) times daily.     Marland Kitchen ibuprofen (ADVIL,MOTRIN) 800 MG tablet TAKE ONE TABLET BY MOUTH TWICE DAILY AS NEEDED 180 tablet 0  . meloxicam (MOBIC) 7.5 MG tablet Take 1 tablet (7.5 mg total) by mouth as needed for pain. 30 tablet 3   No facility-administered medications prior to visit.     Review of Systems;  Patient denies headache, fevers, malaise, unintentional weight loss, skin rash, eye pain, sinus congestion and sinus pain, sore throat, dysphagia,  hemoptysis , cough, dyspnea, wheezing, chest pain, palpitations, orthopnea, edema, abdominal pain, nausea, melena, diarrhea, constipation, flank pain, dysuria, hematuria, urinary  Frequency, nocturia, numbness, tingling, seizures,  Focal weakness, Loss of consciousness,  Tremor, insomnia, depression, anxiety, and suicidal ideation.      Objective:  BP (!) 148/84   Pulse 92   Temp 97.5 F (36.4 C) (Oral)   Resp 16   Ht 5\' 3"  (1.6 m)   Wt 134 lb 8 oz (61 kg)   SpO2 99%   BMI 23.83 kg/m   BP Readings from Last 3 Encounters:  05/04/16 (!) 148/84  04/15/16 140/88  03/07/16 120/70    Wt Readings from Last  3 Encounters:  05/04/16 134 lb 8 oz (61 kg)  04/15/16 133 lb (60.3 kg)  03/07/16 133 lb 12.8 oz (60.7 kg)    General appearance: alert, cooperative and appears stated age Ears: normal TM's and external ear canals both ears Throat: lips, mucosa, and tongue normal; teeth and gums normal Neck: no adenopathy, no carotid bruit, supple, symmetrical, trachea midline and thyroid not enlarged, symmetric, no tenderness/mass/nodules Back: symmetric, no curvature. ROM normal. No CVA tenderness. Lungs: clear to auscultation bilaterally Heart: regular rate and rhythm, S1, S2 normal, no murmur, click, rub or gallop Abdomen: soft,  non-tender; bowel sounds normal; no masses,  no organomegaly Pulses: 2+ and symmetric Skin: Skin color, texture, turgor normal. No rashes or lesions Lymph nodes: Cervical, supraclavicular, and axillary nodes normal.  Lab Results  Component Value Date   HGBA1C 5.9 (H) 01/12/2016   HGBA1C 5.9 10/21/2013    Lab Results  Component Value Date   CREATININE 0.55 01/28/2016   CREATININE 0.48 01/13/2016   CREATININE 0.64 01/12/2016    Lab Results  Component Value Date   WBC 8.3 01/13/2016   HGB 12.9 01/13/2016   HCT 36.6 01/13/2016   PLT 186 01/13/2016   GLUCOSE 101 (H) 01/28/2016   CHOL 183 01/28/2016   TRIG 105.0 01/28/2016   HDL 84.20 01/28/2016   LDLCALC 78 01/28/2016   ALT 22 01/28/2016   AST 25 01/28/2016   NA 142 01/28/2016   K 5.0 01/28/2016   CL 104 01/28/2016   CREATININE 0.55 01/28/2016   BUN 10 01/28/2016   CO2 30 01/28/2016   TSH 4.38 01/28/2016   HGBA1C 5.9 (H) 01/12/2016   MICROALBUR <0.7 01/28/2016    US Breast Ltd Uni Right Inc Axilla  Result Date: 03/10/2016 CLINICAL DATA:  Right breast upper outer quadrant focal asymmetry seen on most recent screening mammography. EXAM: 2D DIGITAL DIAGNOSTIC RIGHT MAMMOGRAM WITH CAD AND ADJUNCT TOMO ULTRASOUND RIGHT BREAST COMPARISON:  Previous exam(s). ACR Breast Density Category b: There are scattered areas of fibroglandular density. FINDINGS: Mammographically, there are no suspicious masses, areas of architectural distortion or microcalcifications. The previously seen focal asymmetry in the right breast upper outer quadrant effaces to glandular tissue. Mammographic images were processed with CAD. On physical exam, no suspicious findings. Targeted ultrasound is performed, showing no suspicious masses or shadowing lesions. IMPRESSION: No mammographic or sonographic evidence of malignancy in the right breast. RECOMMENDATION: Screening mammogram in one year.(Code:SM-B-01Y) I have discussed the findings and recommendations with  the patient. Results were also provided in writing at the conclusion of the visit. If applicable, a reminder letter will be sent to the patient regarding the next appointment. BI-RADS CATEGORY  1: Negative. Electronically Signed   By: Fidela Salisbury M.D.   On: 03/10/2016 11:26   Mm Diag Breast Tomo Uni Right  Result Date: 03/10/2016 CLINICAL DATA:  Right breast upper outer quadrant focal asymmetry seen on most recent screening mammography. EXAM: 2D DIGITAL DIAGNOSTIC RIGHT MAMMOGRAM WITH CAD AND ADJUNCT TOMO ULTRASOUND RIGHT BREAST COMPARISON:  Previous exam(s). ACR Breast Density Category b: There are scattered areas of fibroglandular density. FINDINGS: Mammographically, there are no suspicious masses, areas of architectural distortion or microcalcifications. The previously seen focal asymmetry in the right breast upper outer quadrant effaces to glandular tissue. Mammographic images were processed with CAD. On physical exam, no suspicious findings. Targeted ultrasound is performed, showing no suspicious masses or shadowing lesions. IMPRESSION: No mammographic or sonographic evidence of malignancy in the right breast. RECOMMENDATION: Screening mammogram in one  year.(Code:[id number]-01Y) I have discussed the findings and recommendations with the patient. Results were also provided in writing at the conclusion of the visit. If applicable, a reminder letter will be sent to the patient regarding the next appointment. BI-RADS CATEGORY  1: Negative. Electronically Signed   By: Fidela Salisbury M.D.   On: 03/10/2016 11:26    Assessment & Plan:   Problem List Items Addressed This Visit    COPD (chronic obstructive pulmonary disease) ([place])    She is asymptomatic at rest and cannot afford the maintenance inhalers.  Her chief complaint is post nasal drip and dyspnea with exertion.  Recommend trial of benadryl at night and albuterol prn       Elevated blood pressure reading    secondary to use of NSAIDs. If he  pain cannot be controlled without opioids, will resume NSAID and treat resultant hypertension       Post-nasal drip    Suggested trial of 12.5 mg of benadryl and saline flushes.        Other Visit Diagnoses    Need for prophylactic vaccination against Streptococcus pneumoniae (pneumococcus)    -  Primary   Relevant Orders   Pneumococcal polysaccharide vaccine 23-valent greater than or equal to 2yo subcutaneous/IM (Completed)      I have discontinued Ms. Speelman's ibuprofen and meloxicam. I am also having her maintain her MULTIPLE VITAMIN PO, calcium carbonate, simvastatin, pantoprazole, fluocinonide cream, valACYclovir, Ipratropium-Albuterol, and albuterol.  No orders of the defined types were placed in this encounter.   Medications Discontinued During This Encounter  Medication Reason  . meloxicam (MOBIC) 7.5 MG tablet   . ibuprofen (ADVIL,MOTRIN) 800 MG tablet     Follow-up: No Follow-up on file.   [name], MD

## 2016-05-04 NOTE — Patient Instructions (Addendum)
Try using Benadryl  eithe r12,5 mg or 25 mg at bvedtiem to see if it helps dry up the phlegm .  You can also try flushing your sinuses with salt water in the morning. [facility] is the one I like)   Get Gwen to use [facility] app to see if the combivent is cheaprer  Your blood pressure is still elevated above goal of 120/70  Because of the advil,/ motrin. And meloxicam .  Stay off of them or we will need to start BP medications  Ok to  use tyelnol  , Up to 2,000   mg in a 24 hour period .  I Use some (500 mg ) before yoou go for a walk)   I  can prescribe tramadol if additional pain medication is needed for control of pain   Ask the pharmacist/[facility]  about Spiriva,  And Advair .

## 2016-05-04 NOTE — Progress Notes (Signed)
Pre-visit discussion using our clinic review tool. No additional management support is needed unless otherwise documented below in the visit note.  

## 2016-05-07 DIAGNOSIS — R0982 Postnasal drip: Secondary | ICD-10-CM | POA: Insufficient documentation

## 2016-05-07 NOTE — Assessment & Plan Note (Signed)
secondary to use of NSAIDs. If he pain cannot be controlled without opioids, will resume NSAID and treat resultant hypertension

## 2016-05-07 NOTE — Assessment & Plan Note (Addendum)
Suggested trial of 12.5 mg of benadryl and saline flushes.

## 2016-05-07 NOTE — Assessment & Plan Note (Signed)
She is asymptomatic at rest and cannot afford the maintenance inhalers.  Her chief complaint is post nasal drip and dyspnea with exertion.  Recommend trial of benadryl at night and albuterol prn

## 2016-06-04 ENCOUNTER — Other Ambulatory Visit: Payer: Self-pay | Admitting: Family Medicine

## 2016-06-04 DIAGNOSIS — K219 Gastro-esophageal reflux disease without esophagitis: Secondary | ICD-10-CM

## 2016-06-08 ENCOUNTER — Encounter: Payer: Self-pay | Admitting: Internal Medicine

## 2016-06-10 ENCOUNTER — Encounter: Payer: Self-pay | Admitting: Internal Medicine

## 2016-06-10 ENCOUNTER — Emergency Department: Payer: Medicare Other

## 2016-06-10 ENCOUNTER — Encounter: Payer: Self-pay | Admitting: Family Medicine

## 2016-06-10 ENCOUNTER — Ambulatory Visit (INDEPENDENT_AMBULATORY_CARE_PROVIDER_SITE_OTHER): Payer: Medicare Other | Admitting: Family Medicine

## 2016-06-10 ENCOUNTER — Inpatient Hospital Stay
Admission: EM | Admit: 2016-06-10 | Discharge: 2016-06-12 | DRG: 190 | Disposition: A | Discharge status: Home / Self Care | Payer: Medicare Other | Attending: Internal Medicine | Admitting: Internal Medicine

## 2016-06-10 ENCOUNTER — Encounter: Payer: Self-pay | Admitting: Emergency Medicine

## 2016-06-10 VITALS — BP 130/70 | Temp 98.5°F | Wt 137.2 lb

## 2016-06-10 DIAGNOSIS — M810 Age-related osteoporosis without current pathological fracture: Secondary | ICD-10-CM | POA: Diagnosis present

## 2016-06-10 DIAGNOSIS — J9601 Acute respiratory failure with hypoxia: Secondary | ICD-10-CM | POA: Diagnosis present

## 2016-06-10 DIAGNOSIS — Z79899 Other long term (current) drug therapy: Secondary | ICD-10-CM | POA: Diagnosis not present

## 2016-06-10 DIAGNOSIS — E039 Hypothyroidism, unspecified: Secondary | ICD-10-CM | POA: Diagnosis present

## 2016-06-10 DIAGNOSIS — M4850XA Collapsed vertebra, not elsewhere classified, site unspecified, initial encounter for fracture: Secondary | ICD-10-CM | POA: Diagnosis not present

## 2016-06-10 DIAGNOSIS — J441 Chronic obstructive pulmonary disease with (acute) exacerbation: Secondary | ICD-10-CM | POA: Diagnosis not present

## 2016-06-10 DIAGNOSIS — Z87891 Personal history of nicotine dependence: Secondary | ICD-10-CM | POA: Diagnosis not present

## 2016-06-10 DIAGNOSIS — R079 Chest pain, unspecified: Secondary | ICD-10-CM | POA: Diagnosis not present

## 2016-06-10 DIAGNOSIS — K120 Recurrent oral aphthae: Secondary | ICD-10-CM | POA: Diagnosis present

## 2016-06-10 DIAGNOSIS — K219 Gastro-esophageal reflux disease without esophagitis: Secondary | ICD-10-CM | POA: Diagnosis present

## 2016-06-10 DIAGNOSIS — M4854XA Collapsed vertebra, not elsewhere classified, thoracic region, initial encounter for fracture: Secondary | ICD-10-CM | POA: Diagnosis present

## 2016-06-10 DIAGNOSIS — S22050A Wedge compression fracture of T5-T6 vertebra, initial encounter for closed fracture: Secondary | ICD-10-CM | POA: Diagnosis not present

## 2016-06-10 DIAGNOSIS — M549 Dorsalgia, unspecified: Secondary | ICD-10-CM | POA: Diagnosis not present

## 2016-06-10 DIAGNOSIS — S22060A Wedge compression fracture of T7-T8 vertebra, initial encounter for closed fracture: Secondary | ICD-10-CM | POA: Diagnosis not present

## 2016-06-10 DIAGNOSIS — M546 Pain in thoracic spine: Secondary | ICD-10-CM | POA: Diagnosis not present

## 2016-06-10 DIAGNOSIS — S22000A Wedge compression fracture of unspecified thoracic vertebra, initial encounter for closed fracture: Secondary | ICD-10-CM

## 2016-06-10 DIAGNOSIS — J449 Chronic obstructive pulmonary disease, unspecified: Secondary | ICD-10-CM | POA: Diagnosis not present

## 2016-06-10 DIAGNOSIS — R0602 Shortness of breath: Secondary | ICD-10-CM | POA: Diagnosis not present

## 2016-06-10 DIAGNOSIS — R0902 Hypoxemia: Secondary | ICD-10-CM | POA: Diagnosis not present

## 2016-06-10 DIAGNOSIS — I10 Essential (primary) hypertension: Secondary | ICD-10-CM | POA: Diagnosis present

## 2016-06-10 DIAGNOSIS — E785 Hyperlipidemia, unspecified: Secondary | ICD-10-CM | POA: Diagnosis present

## 2016-06-10 DIAGNOSIS — S22030A Wedge compression fracture of third thoracic vertebra, initial encounter for closed fracture: Secondary | ICD-10-CM | POA: Diagnosis not present

## 2016-06-10 DIAGNOSIS — R51 Headache: Secondary | ICD-10-CM | POA: Diagnosis not present

## 2016-06-10 HISTORY — DX: Chronic obstructive pulmonary disease, unspecified: J44.9

## 2016-06-10 LAB — URINALYSIS, COMPLETE (UACMP) WITH MICROSCOPIC
Bacteria, UA: NONE SEEN
Bilirubin Urine: NEGATIVE
GLUCOSE, UA: NEGATIVE mg/dL
Hgb urine dipstick: NEGATIVE
KETONES UR: 20 mg/dL — AB
Leukocytes, UA: NEGATIVE
NITRITE: NEGATIVE
PH: 7 (ref 5.0–8.0)
Protein, ur: NEGATIVE mg/dL
SPECIFIC GRAVITY, URINE: 1.009 (ref 1.005–1.030)

## 2016-06-10 LAB — CBC WITH DIFFERENTIAL/PLATELET
Basophils Absolute: 0 10*3/uL (ref 0–0.1)
Basophils Relative: 0 %
EOS ABS: 0.1 10*3/uL (ref 0–0.7)
EOS PCT: 1 %
HCT: 41.7 % (ref 35.0–47.0)
Hemoglobin: 14.4 g/dL (ref 12.0–16.0)
LYMPHS ABS: 1.3 10*3/uL (ref 1.0–3.6)
LYMPHS PCT: 9 %
MCH: 31.4 pg (ref 26.0–34.0)
MCHC: 34.4 g/dL (ref 32.0–36.0)
MCV: 91.4 fL (ref 80.0–100.0)
MONOS PCT: 5 %
Monocytes Absolute: 0.7 10*3/uL (ref 0.2–0.9)
Neutro Abs: 11.9 10*3/uL — ABNORMAL HIGH (ref 1.4–6.5)
Neutrophils Relative %: 85 %
PLATELETS: 230 10*3/uL (ref 150–440)
RBC: 4.57 MIL/uL (ref 3.80–5.20)
RDW: 14.1 % (ref 11.5–14.5)
WBC: 13.9 10*3/uL — ABNORMAL HIGH (ref 3.6–11.0)

## 2016-06-10 LAB — COMPREHENSIVE METABOLIC PANEL
ALK PHOS: 58 U/L (ref 38–126)
ALT: 21 U/L (ref 14–54)
ANION GAP: 8 (ref 5–15)
AST: 26 U/L (ref 15–41)
Albumin: 4 g/dL (ref 3.5–5.0)
BUN: 13 mg/dL (ref 6–20)
CALCIUM: 8.8 mg/dL — AB (ref 8.9–10.3)
CO2: 26 mmol/L (ref 22–32)
CREATININE: 0.55 mg/dL (ref 0.44–1.00)
Chloride: 98 mmol/L — ABNORMAL LOW (ref 101–111)
GFR calc non Af Amer: >60 mL/min (ref >60)
Glucose, Bld: 119 mg/dL — ABNORMAL HIGH (ref 65–99)
Potassium: 3.7 mmol/L (ref 3.5–5.1)
SODIUM: 132 mmol/L — AB (ref 135–145)
Total Bilirubin: 0.8 mg/dL (ref 0.3–1.2)
Total Protein: 7 g/dL (ref 6.5–8.1)

## 2016-06-10 LAB — TROPONIN I: Troponin I: <0.03 ng/mL (ref <0.03)

## 2016-06-10 MED ORDER — ADULT MULTIVITAMIN W/MINERALS CH
1.0000 | ORAL_TABLET | Freq: Every day | ORAL | Status: DC
  Administered 2016-06-10 – 2016-06-11 (×2): 1 via ORAL
  Filled 2016-06-10 (×2): qty 1

## 2016-06-10 MED ORDER — DIPHENHYDRAMINE HCL 25 MG PO CAPS: 25.0000 mg | ORAL_CAPSULE | Freq: Every evening | ORAL | Status: DC | PRN

## 2016-06-10 MED ORDER — CALCIUM CARBONATE ANTACID 500 MG PO CHEW
500.0000 mg | CHEWABLE_TABLET | Freq: Every day | ORAL | Status: DC
  Administered 2016-06-11: 500 mg via ORAL
  Filled 2016-06-10: qty 1

## 2016-06-10 MED ORDER — ALBUTEROL SULFATE (2.5 MG/3ML) 0.083% IN NEBU
2.5000 mg | INHALATION_SOLUTION | Freq: Once | RESPIRATORY_TRACT | Status: AC
  Administered 2016-06-10: 2.5 mg via RESPIRATORY_TRACT
  Filled 2016-06-10: qty 3

## 2016-06-10 MED ORDER — METHYLPREDNISOLONE SODIUM SUCC 40 MG IJ SOLR
40.0000 mg | Freq: Two times a day (BID) | INTRAMUSCULAR | Status: DC
  Administered 2016-06-10 – 2016-06-12 (×4): 40 mg via INTRAVENOUS
  Filled 2016-06-10 (×4): qty 1

## 2016-06-10 MED ORDER — LIDOCAINE 5 % EX PTCH
1.0000 | MEDICATED_PATCH | CUTANEOUS | Status: DC
  Administered 2016-06-10 – 2016-06-11 (×2): 1 via TRANSDERMAL
  Filled 2016-06-10 (×2): qty 1

## 2016-06-10 MED ORDER — TRAMADOL HCL 50 MG PO TABS
50.0000 mg | ORAL_TABLET | Freq: Four times a day (QID) | ORAL | Status: DC | PRN
  Administered 2016-06-10: 50 mg via ORAL
  Filled 2016-06-10: qty 1

## 2016-06-10 MED ORDER — SIMVASTATIN 20 MG PO TABS
40.0000 mg | ORAL_TABLET | Freq: Every day | ORAL | Status: DC
  Administered 2016-06-10 – 2016-06-11 (×2): 40 mg via ORAL
  Filled 2016-06-10 (×2): qty 2

## 2016-06-10 MED ORDER — METOPROLOL TARTRATE 5 MG/5ML IV SOLN: 5.0000 mg | INTRAVENOUS | Status: DC | PRN

## 2016-06-10 MED ORDER — IBUPROFEN 400 MG PO TABS: 600.0000 mg | ORAL_TABLET | Freq: Four times a day (QID) | ORAL | Status: DC | PRN

## 2016-06-10 MED ORDER — METHYLPREDNISOLONE SODIUM SUCC 125 MG IJ SOLR
125.0000 mg | Freq: Once | INTRAMUSCULAR | Status: AC
  Administered 2016-06-10: 125 mg via INTRAVENOUS
  Filled 2016-06-10: qty 2

## 2016-06-10 MED ORDER — VALACYCLOVIR HCL 500 MG PO TABS: 1000.0000 mg | ORAL_TABLET | Freq: Two times a day (BID) | ORAL | Status: DC | PRN

## 2016-06-10 MED ORDER — ALBUTEROL SULFATE (2.5 MG/3ML) 0.083% IN NEBU: 2.5000 mg | INHALATION_SOLUTION | RESPIRATORY_TRACT | Status: DC | PRN

## 2016-06-10 MED ORDER — PANTOPRAZOLE SODIUM 40 MG PO TBEC
40.0000 mg | DELAYED_RELEASE_TABLET | Freq: Every day | ORAL | Status: DC
  Administered 2016-06-10 – 2016-06-11 (×2): 40 mg via ORAL
  Filled 2016-06-10 (×2): qty 1

## 2016-06-10 MED ORDER — ACETAMINOPHEN 500 MG PO TABS: 500.0000 mg | ORAL_TABLET | Freq: Four times a day (QID) | ORAL | Status: DC | PRN

## 2016-06-10 MED ORDER — ALBUTEROL SULFATE HFA 108 (90 BASE) MCG/ACT IN AERS: 1.0000 | INHALATION_SPRAY | RESPIRATORY_TRACT | Status: DC | PRN

## 2016-06-10 MED ORDER — IPRATROPIUM-ALBUTEROL 0.5-2.5 (3) MG/3ML IN SOLN
9.0000 mL | Freq: Once | RESPIRATORY_TRACT | Status: AC
  Administered 2016-06-10: 9 mL via RESPIRATORY_TRACT
  Filled 2016-06-10: qty 9

## 2016-06-10 NOTE — ED Notes (Signed)
Unsuccessful IV attempt by Levada Dy, RN  Unsuccessful IV attempt by Elita Quick, student nurse

## 2016-06-10 NOTE — ED Notes (Signed)
Admitting MD at bedside.

## 2016-06-10 NOTE — ED Triage Notes (Signed)
Patient to ED via EMS for back pain from [facility] Per EMS they were told that patients O2 sats were in the high 70's lower 80's on room air, upon arrival to ED pts hands were found to be exteremly cold. O2 probe placed on patients ear lobe.O2 sat reading 97-100% on room air

## 2016-06-10 NOTE — H&P (Addendum)
[facility] [place] NAME: Mary Howe    MR#:  [id number]  DATE OF BIRTH:  [birth year]  DATE OF ADMISSION:  06/10/2016  PRIMARY CARE PHYSICIAN: [name], MD   REQUESTING/REFERRING PHYSICIAN: Orbie Pyo, MD  CHIEF COMPLAINT:  Back pain  HISTORY OF PRESENT ILLNESS:  Mary Howe  is a 83 y.o. female with a known history of Chronic T3-T5 compression fracture, COPD, GERD, hyponatremia and other medical problems is brought into the emergency department with intermittent episodes of worsening back pain. Denies any falls. Patient was taking Fosamax for osteoporosis in the past but was discontinued according to primary care physician's note. Here in the ED CT of the thoracic spine has revealed a acute versus subacute T7 compression fracture with 15 percent height loss. Patient was also hypoxemic in the emergency department with Pulsavac 79% on room air. Patient was started on oxygen and IV Solu-Medrol was given for COPD exacerbation and hospitalist team is called to admit the patient. Patient and her daughter was refusing narcotics as patient had a transient memory loss in the past after giving narcotics. They are agreeable with Tylenol, ibuprofen and tramadol for pain management  PAST MEDICAL HISTORY:   Past Medical History:  Diagnosis Date  . COPD (chronic obstructive pulmonary disease) ([place])   . Diverticulitis 2015  . GERD (gastroesophageal reflux disease)   . Hyperlipidemia   . Thyroid disease     PAST SURGICAL HISTOIRY:   Past Surgical History:  Procedure Laterality Date  . CERVICAL CONE BIOPSY    . CHOLECYSTECTOMY    . HEMORROIDECTOMY      SOCIAL HISTORY:   Social History  Substance Use Topics  . Smoking status: Former Research scientist (life sciences)  . Smokeless tobacco: Never Used     Comment: quit smoking in 1990  . Alcohol use 0.0 oz/week     Comment: OCCASIONALLY DRINKS WINE    FAMILY HISTORY:   Family History   Problem Relation Age of Onset  . Prostate cancer Father   . Pneumonia Brother   . Lung disease Brother   . CAD Brother   . Heart disease Brother   . CAD Brother   . Heart attack Brother   . Brain cancer Brother   . Bone cancer Sister   . Emphysema Sister   . Fibrocystic breast disease Sister   . Aneurysm Sister   . Heart disease Sister     DRUG ALLERGIES:  No Known Allergies  REVIEW OF SYSTEMS:  CONSTITUTIONAL: No fever, fatigue or weakness.  EYES: No blurred or double vision.  EARS, NOSE, AND THROAT: No tinnitus or ear pain.  RESPIRATORY: No cough,Reports Some shortness of breath, denies wheezing or hemoptysis.  CARDIOVASCULAR: No chest pain, orthopnea, edema.  GASTROINTESTINAL: No nausea, vomiting, diarrhea or abdominal pain.  GENITOURINARY: No dysuria, hematuria.  ENDOCRINE: No polyuria, nocturia,  HEMATOLOGY: No anemia, easy bruising or bleeding SKIN: No rash or lesion. MUSCULOSKELETAL: Reporting back pain and could not recall if she has osteoporosis  NEUROLOGIC: No tingling, numbness, weakness.  PSYCHIATRY: No anxiety or depression.   MEDICATIONS AT HOME:   Prior to Admission medications   Medication Sig Start Date End Date Taking? Authorizing Provider  acetaminophen (TYLENOL) 500 MG tablet Take 500 mg by mouth every 6 (six) hours as needed.   Yes Historical Provider, MD  albuterol (PROVENTIL HFA;VENTOLIN HFA) 108 (90 Base) MCG/ACT inhaler Inhale 1-2 puffs into the lungs every 4 (four) hours as needed for  wheezing or shortness of breath. 04/20/16  Yes Wilhelmina Mcardle, MD  calcium carbonate (OS-CAL) 600 MG TABS tablet Take 600 mg by mouth daily with breakfast.  02/26/13  Yes Historical Provider, MD  diphenhydrAMINE (BENADRYL) 25 MG tablet Take 25 mg by mouth at bedtime as needed.   Yes Historical Provider, MD  MULTIPLE VITAMIN PO Take 1 tablet by mouth daily.  02/26/13  Yes Historical Provider, MD  pantoprazole (PROTONIX) 40 MG tablet Take 1 tablet (40 mg total) by  mouth daily. 06/01/15  Yes Richard Maceo Pro., MD  simvastatin (ZOCOR) 40 MG tablet Take 1 tablet (40 mg total) by mouth at bedtime. 06/01/15  Yes Richard Maceo Pro., MD  valACYclovir (VALTREX) 1000 MG tablet Take 1,000 mg by mouth 2 (two) times daily.  12/08/15  Yes Historical Provider, MD  fluocinonide cream (LIDEX) 0.05 % Apply 3 x day to back rash Patient not taking: Reported on 06/10/2016 12/21/15   Carmon Ginsberg, PA      VITAL SIGNS:  Blood pressure (!) 117/96, pulse 100, temperature 97.8 F (36.6 C), resp. rate (!) 31, SpO2 92 %.  PHYSICAL EXAMINATION:  GENERAL:  83 y.o.-year-old patient lying in the bed with no acute distress.  EYES: Pupils equal, round, reactive to light and accommodation. No scleral icterus. Extraocular muscles intact.  HEENT: Head atraumatic, normocephalic. Oropharynx and nasopharynx clear.  NECK:  Supple, no jugular venous distention. No thyroid enlargement, no tenderness.  LUNGS: Diminished breath sounds bilaterally, no wheezing, rales,rhonchi or crepitation. No use of accessory muscles of respiration.  CARDIOVASCULAR: S1, S2 normal. No murmurs, rubs, or gallops.  ABDOMEN: Soft, nontender, nondistended. Bowel sounds present. No organomegaly or mass.  EXTREMITIES: Tender in the thoracic spine area. No pedal edema, cyanosis, or clubbing.  NEUROLOGIC: Cranial nerves II through XII are intact. Muscle strength and sensory seem to be intact  PSYCHIATRIC: The patient is alert and oriented x 3.  SKIN: No obvious rash, lesion, or ulcer.   LABORATORY PANEL:   CBC  Recent Labs Lab 06/10/16 1006  WBC 13.9*  HGB 14.4  HCT 41.7  PLT 230   ------------------------------------------------------------------------------------------------------------------  Chemistries   Recent Labs Lab 06/10/16 1006  NA 132*  K 3.7  CL 98*  CO2 26  GLUCOSE 119*  BUN 13  CREATININE 0.55  CALCIUM 8.8*  AST 26  ALT 21  ALKPHOS 58  BILITOT 0.8    ------------------------------------------------------------------------------------------------------------------  Cardiac Enzymes  Recent Labs Lab 06/10/16 1006  TROPONINI <0.03   ------------------------------------------------------------------------------------------------------------------  RADIOLOGY:  Dg Chest 2 View  Result Date: 06/10/2016 CLINICAL DATA:  Shortness of breath and chest pain EXAM: CHEST  2 VIEW COMPARISON:  CT from earlier in the same day, 01/13/2016 FINDINGS: Cardiac shadow is mildly enlarged but stable. The lungs are well aerated bilaterally. Chronic fibrotic changes are noted similar to that seen on recent CT. T5 and T7 compression deformities are again noted similar to that seen on recent CTs. IMPRESSION: Chronic fibrotic changes and hyperinflation. T5 and T7 compression deformity similar to that seen on recent CT. Electronically Signed   By: Inez Catalina M.D.   On: 06/10/2016 11:15   Ct Head Wo Contrast  Result Date: 06/10/2016 CLINICAL DATA:  Headache and shortness of breath. EXAM: CT HEAD WITHOUT CONTRAST TECHNIQUE: Contiguous axial images were obtained from the base of the skull through the vertex without intravenous contrast. COMPARISON:  01/12/2016 FINDINGS: Brain: Stable mild age related cerebral atrophy, ventriculomegaly and periventricular white matter disease. No extra-axial fluid collections  are identified. No CT findings for acute hemispheric infarction or intracranial hemorrhage. No mass lesions. The brainstem and cerebellum are normal. Vascular: No hyperdense vessels or obvious aneurysm. Stable minimal vascular calcifications. Skull: No skull fracture or bone lesion. Sinuses/Orbits: The paranasal sinuses and mastoid air cells are clear. The globes are intact. Other: No scalp lesions or hematoma. IMPRESSION: Stable mild age related cerebral atrophy, ventriculomegaly and periventricular white matter disease. No acute intracranial findings. Electronically  Signed   By: Marijo Sanes M.D.   On: 06/10/2016 11:10   Ct Thoracic Spine Wo Contrast  Result Date: 06/10/2016 CLINICAL DATA:  History of compression fractures. Back pain since Monday. No known trauma. EXAM: CT THORACIC SPINE WITHOUT CONTRAST TECHNIQUE: Multidetector CT images of the thoracic were obtained using the standard protocol without intravenous contrast. COMPARISON:  None. FINDINGS: Alignment: Normal. Vertebrae: Chronic T3 and T5 vertebral body compression fractures without significant interval height loss compared with 01/13/2016. Interval development of a new T7 vertebral body compression fracture with approximately 15% height loss and without significant retropulsion of the posterior margin of the T7 vertebral body. No other vertebral body compression fracture. No aggressive osseous lesion. Paraspinal and other soft tissues: No paraspinal abnormality. Thoracic aortic atherosclerosis. Bibasilar atelectasis. Severe bilateral centrilobular and paraseptal emphysema. Disc levels: Disc spaces are relatively well maintained. No foraminal or central canal stenosis. IMPRESSION: 1. Acute versus subacute T7 vertebral body compression fracture with approximately 50% height loss. 2. Chronic T3 and T5 vertebral body compression fractures. 3.  Emphysema. (ICD10-J43.9) 4.  Aortic Atherosclerosis (ICD10-170.0) Electronically Signed   By: Kathreen Devoid   On: 06/10/2016 11:11    EKG:   Orders placed or performed during the hospital encounter of 06/10/16  . ED EKG  . ED EKG  . EKG 12-Lead  . EKG 12-Lead    IMPRESSION AND PLAN:   Udana Kudla  is a 83 y.o. female with a known history of Chronic T3-T5 compression fracture, COPD, GERD, hyponatremia and other medical problems is brought into the emergency department with intermittent episodes of worsening back pain. Denies any falls. Patient was taking Fosamax for osteoporosis in the past but was discontinued according to primary care physician's note. Here in  the ED CT of the thoracic spine has revealed a acute versus subacute T7 compression fracture with 15 percent height loss. Patient was also hypoxemic in the emergency department with Pulsavac 79% on room air. Patient was started on oxygen and IV Solu-Medrol was given for COPD exacerbation and hospitalist team is called to admit the patient.  # . Hypoxia secondary to AECOPD and restricted inspiratory efforts from acute low back pain Continue oxygen to maintain pulse ox at around 90% Solu-Medrol IV every 12 hours Nebulizer treatments  #Acute versus subacute T7 compression fracture causing acute low back pain Pain management as needed. We will provide Tylenol as needed for mild pain, ibuprofen 600 mg as needed for moderate pain and tramadol as needed for severe pain. Provide Lidoderm patch. This pain management was discussed extensively with the daughter and patient at bedside and they are agreeable -Consult back surgery Dr. Rudene Christians for possible kyphoplasty  #GERD continue Protonix  #History of osteoporosis according to primary care 4 notes Patient discontinued taking Fosamax and she was not clear why she has discontinued Will consider resuming Fosamax at the time of discharge Continue calcium supplements    All the records are reviewed and case discussed with ED provider. Management plans discussed with the patient, family and they are in  agreement.  CODE STATUS:fc, Daughter is the healthcare power of attorney  TOTAL TIME TAKING CARE OF THIS PATIENT: 45 minutes.   Note: This dictation was prepared with Dragon dictation along with smaller phrase technology. Any transcriptional errors that result from this process are unintentional.  Mary Howe M.D on 06/10/2016 at 5:25 PM  Between 7am to 6pm - Pager - [phone]  After 6pm go to [email or web address] - password [email or web address] [facility]  Office  [phone]  CC: Primary care physician; [name], MD

## 2016-06-10 NOTE — ED Provider Notes (Signed)
[facility] Emergency Department Provider Note  ____________________________________________   First MD Initiated Contact with Patient 06/10/16 1004     (approximate)  I have reviewed the triage vital signs and the nursing notes.   HISTORY  Chief Complaint Back Pain   HPI Mary Howe is a 83 y.o. female With a history of a T3 compression fracture was presented to the emergency department today with severe and intermittent midthoracic back pain. She said that she had exerted herself this past Monday cleaning a deck with bleach in a broom. Says that the pain today has been intermittent and sometimes will increase to a 10 out of 10 with movement. Her daughter is also here and says that the patient is having shortness of breath and a bit of confusion. The pain worsened this morning and the patient took Tylenol at home. In the past, she has had difficulty with mobility because of erratic blood pressures. Also cannot take opiates because of memory difficulties. Says that she has pain to the bilateral thighs which he thinks is radiating from the back pain although she denies any lumbar pain. Denies any weakness or difficulty with urinating lately including any loss of bowel or bladder control.  Patient has a history of emphysema. However is not on any homeoxygen. Found to be 88% on room air en route.  Placed on nasal cannula.  Past Medical History:  Diagnosis Date  . COPD (chronic obstructive pulmonary disease) ([place])   . Diverticulitis 2015  . GERD (gastroesophageal reflux disease)   . Hyperlipidemia   . Thyroid disease     Patient Active Problem List   Diagnosis Date Noted  . Back pain 06/10/2016  . Post-nasal drip 05/07/2016  . COPD (chronic obstructive pulmonary disease) ([place]) 02/06/2016  . History of shingles 01/30/2016  . Age-related osteoporosis with current pathol fracture of vertebra ([place]) 01/30/2016  . Panlobular emphysema ([place]) 01/30/2016  .  Elevated blood pressure reading 01/30/2016  . History of Graves' disease 01/30/2016  . Hospital discharge follow-up 01/30/2016  . Hypoxia 01/12/2016  . DD (diverticular disease) 02/19/2015  . Acid reflux 02/19/2015  . 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 Hypercholesterolemia 02/19/2015  . Neuropathy ([place]) 02/19/2015  . 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 Stasis, venous 02/19/2015  . Avitaminosis D 02/19/2015    Past Surgical History:  Procedure Laterality Date  . CERVICAL CONE BIOPSY    . CHOLECYSTECTOMY    . HEMORROIDECTOMY      Prior to Admission medications   Medication Sig Start Date End Date Taking? Authorizing Provider  acetaminophen (TYLENOL) 500 MG tablet Take 500 mg by mouth every 6 (six) hours as needed.   Yes Historical Provider, MD  albuterol (PROVENTIL HFA;VENTOLIN HFA) 108 (90 Base) MCG/ACT inhaler Inhale 1-2 puffs into the lungs every 4 (four) hours as needed for wheezing or shortness of breath. 04/20/16  Yes Wilhelmina Mcardle, MD  calcium carbonate (OS-CAL) 600 MG TABS tablet Take 600 mg by mouth daily with breakfast.  02/26/13  Yes Historical Provider, MD  diphenhydrAMINE (BENADRYL) 25 MG tablet Take 25 mg by mouth at bedtime as needed.   Yes Historical Provider, MD  MULTIPLE VITAMIN PO Take 1 tablet by mouth daily.  02/26/13  Yes Historical Provider, MD  pantoprazole (PROTONIX) 40 MG tablet Take 1 tablet (40 mg total) by mouth daily. 06/01/15  Yes Richard Maceo Pro., MD  simvastatin (ZOCOR) 40 MG tablet Take 1 tablet (40 mg total) by mouth at  bedtime. 06/01/15  Yes Richard Maceo Pro., MD  valACYclovir (VALTREX) 1000 MG tablet Take 1,000 mg by mouth 2 (two) times daily.  12/08/15  Yes Historical Provider, MD  fluocinonide cream (LIDEX) 0.05 % Apply 3 x day to back rash Patient not taking: Reported on 06/10/2016 12/21/15   Carmon Ginsberg, PA    Allergies Patient has no known allergies.  Family History  Problem  Relation Age of Onset  . Prostate cancer Father   . Pneumonia Brother   . Lung disease Brother   . CAD Brother   . Heart disease Brother   . CAD Brother   . Heart attack Brother   . Brain cancer Brother   . Bone cancer Sister   . Emphysema Sister   . Fibrocystic breast disease Sister   . Aneurysm Sister   . Heart disease Sister     Social History Social History  Substance Use Topics  . Smoking status: Former Research scientist (life sciences)  . Smokeless tobacco: Never Used     Comment: quit smoking in 1990  . Alcohol use 0.0 oz/week     Comment: OCCASIONALLY DRINKS WINE    Review of Systems Constitutional: No fever/chills Eyes: No visual changes. ENT: No sore throat. Cardiovascular: Denies chest pain. Respiratory: Denies shortness of breath. Gastrointestinal: No abdominal pain.  No nausea, no vomiting.  No diarrhea.  No constipation. Genitourinary: Negative for dysuria. Musculoskeletal: as above. Skin: Negative for rash. Neurological: Negative for headaches, focal weakness or numbness.  10-point ROS otherwise negative.  ____________________________________________   PHYSICAL EXAM:  VITAL SIGNS: ED Triage Vitals  Enc Vitals Group     BP 06/10/16 0959 (!) 131/53     Pulse Rate 06/10/16 0959 95     Resp 06/10/16 0959 16     Temp 06/10/16 1031 97.8 F (36.6 C)     Temp src --      SpO2 06/10/16 0959 100 %     Weight --      Height --      Head Circumference --      Peak Flow --      Pain Score 06/10/16 0959 10     Pain Loc --      Pain Edu? --      Excl. in [place]? --     Constitutional: Alert and oriented. no distress but the patient does appear uncomfortable.  Leans to her right side. Eyes: Conjunctivae are normal. PERRL. EOMI. Head: Atraumatic. Nose: No congestion/rhinnorhea. Mouth/Throat: Mucous membranes are moist.  Neck: No stridor.   Cardiovascular: Normal rate, regular rhythm. Grossly normal heart sounds.   Respiratory: Normal respiratory effort with mild tachypnea.  No  retractions. Lungs CTAB. Gastrointestinal: Soft and nontender. No distention.  Musculoskeletal: No lower extremity tenderness nor edema.  No joint effusions.  Tenderness palpation to the midline thoracic spine over the T3-T5 region. No deformity or step-off. No saddle anesthesia. 5 out of 5 strength to bilateral lower extremities. Neurologic:  Normal speech and language. No gross focal neurologic deficits are appreciated.  Skin:  Skin is warm, dry and intact. No rash noted. Psychiatric: Mood and affect are normal. Speech and behavior are normal.  ____________________________________________   LABS (all labs ordered are listed, but only abnormal results are displayed)  Labs Reviewed  CBC WITH DIFFERENTIAL/PLATELET - Abnormal; Notable for the following:       Result Value   WBC 13.9 (*)    Neutro Abs 11.9 (*)    All other components within normal  limits  COMPREHENSIVE METABOLIC PANEL - Abnormal; Notable for the following:    Sodium 132 (*)    Chloride 98 (*)    Glucose, Bld 119 (*)    Calcium 8.8 (*)    All other components within normal limits  URINALYSIS, COMPLETE (UACMP) WITH MICROSCOPIC - Abnormal; Notable for the following:    Color, Urine YELLOW (*)    APPearance CLEAR (*)    Ketones, ur 20 (*)    Squamous Epithelial / LPF 0-5 (*)    All other components within normal limits  TROPONIN I   ____________________________________________  EKG  ED ECG REPORT I, Doran Stabler, the attending physician, personally viewed and interpreted this ECG.   Date: 06/10/2016  EKG Time: 1020  Rate: 88  Rhythm: normal sinus rhythm  Axis: normal axis  Intervals:none  ST&T Change: no ST segment elevation or depression. No abnormal T-wave inversion.  ____________________________________________  [id number]    DG Chest 2 View (Final result)  Result time 06/10/16 11:15:50  Final result by Inez Catalina, MD (06/10/16 11:15:50)           Narrative:   CLINICAL DATA: Shortness  of breath and chest pain  EXAM: CHEST 2 VIEW  COMPARISON: CT from earlier in the same day, 01/13/2016  FINDINGS: Cardiac shadow is mildly enlarged but stable. The lungs are well aerated bilaterally. Chronic fibrotic changes are noted similar to that seen on recent CT. T5 and T7 compression deformities are again noted similar to that seen on recent CTs.  IMPRESSION: Chronic fibrotic changes and hyperinflation.  T5 and T7 compression deformity similar to that seen on recent CT.   Electronically Signed By: Inez Catalina M.D. On: 06/10/2016 11:15            CT Thoracic Spine Wo Contrast (Final result)  Result time 06/10/16 11:11:13  Final result by Jennette Banker, MD (06/10/16 11:11:13)           Narrative:   CLINICAL DATA: History of compression fractures. Back pain since Monday. No known trauma.  EXAM: CT THORACIC SPINE WITHOUT CONTRAST  TECHNIQUE: Multidetector CT images of the thoracic were obtained using the standard protocol without intravenous contrast.  COMPARISON: None.  FINDINGS: Alignment: Normal.  Vertebrae: Chronic T3 and T5 vertebral body compression fractures without significant interval height loss compared with 01/13/2016. Interval development of a new T7 vertebral body compression fracture with approximately 15% height loss and without significant retropulsion of the posterior margin of the T7 vertebral body. No other vertebral body compression fracture. No aggressive osseous lesion.  Paraspinal and other soft tissues: No paraspinal abnormality. Thoracic aortic atherosclerosis. Bibasilar atelectasis. Severe bilateral centrilobular and paraseptal emphysema.  Disc levels: Disc spaces are relatively well maintained. No foraminal or central canal stenosis.  IMPRESSION: 1. Acute versus subacute T7 vertebral body compression fracture with approximately 50% height loss. 2. Chronic T3 and T5 vertebral body compression fractures. 3.  Emphysema. (ICD10-J43.9) 4. Aortic Atherosclerosis (ICD10-170.0)   Electronically Signed By: Kathreen Devoid On: 06/10/2016 11:11            CT Head Wo Contrast (Final result)  Result time 06/10/16 11:10:06  Final result by Kalman Jewels, MD (06/10/16 11:10:06)           Narrative:   CLINICAL DATA: Headache and shortness of breath.  EXAM: CT HEAD WITHOUT CONTRAST  TECHNIQUE: Contiguous axial images were obtained from the base of the skull through the vertex without intravenous contrast.  COMPARISON: 01/12/2016  FINDINGS: Brain: Stable  mild age related cerebral atrophy, ventriculomegaly and periventricular white matter disease. No extra-axial fluid collections are identified. No CT findings for acute hemispheric infarction or intracranial hemorrhage. No mass lesions. The brainstem and cerebellum are normal.  Vascular: No hyperdense vessels or obvious aneurysm. Stable minimal vascular calcifications.  Skull: No skull fracture or bone lesion.  Sinuses/Orbits: The paranasal sinuses and mastoid air cells are clear. The globes are intact.  Other: No scalp lesions or hematoma.  IMPRESSION: Stable mild age related cerebral atrophy, ventriculomegaly and periventricular white matter disease. No acute intracranial findings.   Electronically Signed By: Marijo Sanes M.D. On: 06/10/2016 11:10            ____________________________________________   PROCEDURES  Procedure(s) performed:   Procedures  Critical Care performed:   ____________________________________________   INITIAL IMPRESSION / ASSESSMENT AND PLAN / ED COURSE  Pertinent labs & imaging results that were available during my care of the patient were reviewed by me and considered in my medical decision making (see chart for details).  Reviewed patient's records of her last admission for similar issue this past October. At that time she was worked up for pulmonary embolus was  found to be negative. She was also found to be hypoxic at that time which was likely secondary to her pain although she was also found to have a diagnosis of emphysema on her CAT scan of the chest. Because the patient has had multiple bad reactions to pain medications will hold on her pain medications at this time. I discussed this with the patient as well as the family was at the bedside. Her daughter is here and says that the patient is also seemed confused today. We will do a CAT scan of the brain as well as a urinalysis.    ----------------------------------------- 1:48 PM on 06/10/2016 -----------------------------------------  Patient able to ambulate but desaturates to 79% on room air and becomes tachypnea. We'll treat for COPD. Moves well and without severe pain to the back. Likely COPD exacerbation secondary to back pain from her acute subacute thoracic vertebral fracture. Patient admitted to the hospital.  ____________________________________________   FINAL CLINICAL IMPRESSION(S) / ED DIAGNOSES  hypoxia. COPD. Vertebral fracture.    NEW MEDICATIONS STARTED DURING THIS VISIT:  New Prescriptions   No medications on file     Note:  This document was prepared using Dragon voice recognition software and may include unintentional dictation errors.    Orbie Pyo, MD 06/10/16 [phone]

## 2016-06-10 NOTE — ED Notes (Signed)
Pt given water to drink; unable to give urine sample at this time

## 2016-06-10 NOTE — ED Notes (Signed)
Pt taken off O2 to see if she can tolerate being on room air

## 2016-06-10 NOTE — Assessment & Plan Note (Addendum)
Patient with worsening back pain over the last day or so. Does have a history of compression fractures. She has been somewhat short of breath and does have some chest discomfort with this. Suspect related to compression fractures in her back. She is hypoxic on exam at 78%. Suspect that is related to splinting from pain though given chest discomfort EKG was performed and reassuring. She will be evaluated in the emergency room for this given her hypoxia for treatment of hypoxia and pain control and evaluation of her discomfort. Given hypoxia she'll be transported by EMS. CMA will contact the charge nurse and let them know the patient is on her way.

## 2016-06-10 NOTE — ED Notes (Signed)
Patient ambulated by Dr. Clearnce Hasten. Dr. Clearnce Hasten reports that patients O2 sats dropped to 79% on room air during ambulation.

## 2016-06-10 NOTE — Progress Notes (Signed)
Pre visit review using our clinic review tool, if applicable. No additional management support is needed unless otherwise documented below in the visit note. 

## 2016-06-10 NOTE — Progress Notes (Signed)
  Tommi Rumps, MD Phone: [phone]  Mary Howe is a 83 y.o. female who presents today for same-day visit.  Patient notes worsening back pain this morning. Had a little bit about a week ago though woke up today and it was severe in her thoracic back radiating down to her low back. It is midline pain. She notes no numbness or weakness. No saddle anesthesia or loss of bowel or bladder function. No fevers. Does note a little radiation down her legs. She does note some shortness of breath with this. She notes a slight chest pressure as well with this. Her daughter notes she has been breathing heavily with this. They've tried lidocaine patches that do help some and Tylenol that did not help very much today. She was seen for similar symptoms last year in the emergency room and admitted and found to have compression fractures. She notes pain is worse with breathing and with movement. She does have a history of COPD.  PMH: Former smoker   ROS see history of present illness  Objective  Physical Exam Vitals:   06/10/16 0846  BP: 130/70  Temp: 98.5 F (36.9 C)    BP Readings from Last 3 Encounters:  06/10/16 130/70  05/04/16 (!) 148/84  04/15/16 140/88   Wt Readings from Last 3 Encounters:  06/10/16 137 lb 3.2 oz (62.2 kg)  05/04/16 134 lb 8 oz (61 kg)  04/15/16 133 lb (60.3 kg)    Physical Exam  Constitutional: No distress.  HENT:  Head: Normocephalic and atraumatic.  Cardiovascular: Normal rate, regular rhythm and normal heart sounds.   2+ radial pulses bilaterally  Pulmonary/Chest: Effort normal and breath sounds normal.  Musculoskeletal: She exhibits no edema.  No midline spine tenderness, no midline spine step-off, no muscular back tenderness  Neurological: She is alert.  5 out of 5 strength bilateral quads, hamstrings, plantar flexion, and dorsiflexion, sensation to light touch intact bilateral lower extremities  Skin: Skin is warm and dry. She is not diaphoretic.    EKG: Normal sinus rhythm, rate 87, negative precordial T waves, similar to prior EKG with exception of rate being in the normal range now  She was placed on 2 L of oxygen initially and her oxygen came up to 88%. This was increased at 3 L and then EMS arrived.  Assessment/Plan: Please see individual problem list.  Back pain Patient with worsening back pain over the last day or so. Does have a history of compression fractures. She has been somewhat short of breath and does have some chest discomfort with this. Suspect related to compression fractures in her back. She is hypoxic on exam at 78%. Suspect that is related to splinting from pain though given chest discomfort EKG was performed and reassuring. She will be evaluated in the emergency room for this given her hypoxia for treatment of hypoxia and pain control and evaluation of her discomfort. Given hypoxia she'll be transported by EMS. CMA will contact the charge nurse and let them know the patient is on her way.   Orders Placed This Encounter  Procedures  . EKG 12-Lead    Tommi Rumps, MD [place]

## 2016-06-11 MED ORDER — BENZOCAINE 10 % MT GEL
Freq: Three times a day (TID) | OROMUCOSAL | Status: DC | PRN
  Filled 2016-06-11 (×2): qty 9.4

## 2016-06-11 MED ORDER — ENOXAPARIN SODIUM 40 MG/0.4ML ~~LOC~~ SOLN
40.0000 mg | SUBCUTANEOUS | Status: DC
  Administered 2016-06-11: 40 mg via SUBCUTANEOUS
  Filled 2016-06-11: qty 0.4

## 2016-06-11 NOTE — Progress Notes (Signed)
Pt still requiring 2L of oxygen. Pt still has some shortness of breath with exertion. Forgetful at times.

## 2016-06-11 NOTE — Progress Notes (Signed)
[facility] NAME: Mary Howe    MR#:  [id number]  DATE OF BIRTH:  [birth year]  SUBJECTIVE: Admitted for hypoxia, back pain. She says today she has no back pain ,able to walk, no shortness of breath, slept well.   CHIEF COMPLAINT:   Chief Complaint  Patient presents with  . Back Pain    REVIEW OF SYSTEMS:   ROS CONSTITUTIONAL: No fever, fatigue or weakness.  EYES: No blurred or double vision.  EARS, NOSE, AND THROAT: No tinnitus or ear pain.  RESPIRATORY: No cough, shortness of breath, wheezing or hemoptysis.  CARDIOVASCULAR: No chest pain, orthopnea, edema.  GASTROINTESTINAL: No nausea, vomiting, diarrhea or abdominal pain.  GENITOURINARY: No dysuria, hematuria.  ENDOCRINE: No polyuria, nocturia,  HEMATOLOGY: No anemia, easy bruising or bleeding SKIN: No rash or lesion. MUSCULOSKELETAL: No joint pain or arthritis.   NEUROLOGIC: No tingling, numbness, weakness.  PSYCHIATRY: No anxiety or depression.   DRUG ALLERGIES:  No Known Allergies  VITALS:  Blood pressure 129/61, pulse 79, temperature 98.7 F (37.1 C), temperature source Oral, resp. rate 18, height 5\' 4"  (1.626 m), weight 61.2 kg (134 lb 14.4 oz), SpO2 93 %.  PHYSICAL EXAMINATION:  GENERAL:  83 y.o.-year-old patient lying in the bed with no acute distress.  EYES: Pupils equal, round, reactive to light and accommodation. No scleral icterus. Extraocular muscles intact.  HEENT: Head atraumatic, normocephalic. Oropharynx and nasopharynx clear.  NECK:  Supple, no jugular venous distention. No thyroid enlargement, no tenderness.  LUNGS: Normal breath sounds bilaterally, no wheezing, rales,rhonchi or crepitation. No use of accessory muscles of respiration.  CARDIOVASCULAR: S1, S2 normal. No murmurs, rubs, or gallops.  ABDOMEN: Soft, nontender, nondistended. Bowel sounds present. No organomegaly or mass.  EXTREMITIES: No pedal edema, cyanosis, or clubbing.   NEUROLOGIC: Cranial nerves II through XII are intact. Muscle strength 5/5 in all extremities. Sensation intact. Gait not checked.  PSYCHIATRIC: The patient is alert and oriented x 3.  SKIN: No obvious rash, lesion, or ulcer.    LABORATORY PANEL:   CBC  Recent Labs Lab 06/10/16 1006  WBC 13.9*  HGB 14.4  HCT 41.7  PLT 230   ------------------------------------------------------------------------------------------------------------------  Chemistries   Recent Labs Lab 06/10/16 1006  NA 132*  K 3.7  CL 98*  CO2 26  GLUCOSE 119*  BUN 13  CREATININE 0.55  CALCIUM 8.8*  AST 26  ALT 21  ALKPHOS 58  BILITOT 0.8   ------------------------------------------------------------------------------------------------------------------  Cardiac Enzymes  Recent Labs Lab 06/10/16 1006  TROPONINI <0.03   ------------------------------------------------------------------------------------------------------------------  RADIOLOGY:  Dg Chest 2 View  Result Date: 06/10/2016 CLINICAL DATA:  Shortness of breath and chest pain EXAM: CHEST  2 VIEW COMPARISON:  CT from earlier in the same day, 01/13/2016 FINDINGS: Cardiac shadow is mildly enlarged but stable. The lungs are well aerated bilaterally. Chronic fibrotic changes are noted similar to that seen on recent CT. T5 and T7 compression deformities are again noted similar to that seen on recent CTs. IMPRESSION: Chronic fibrotic changes and hyperinflation. T5 and T7 compression deformity similar to that seen on recent CT. Electronically Signed   By: Inez Catalina M.D.   On: 06/10/2016 11:15   Ct Head Wo Contrast  Result Date: 06/10/2016 CLINICAL DATA:  Headache and shortness of breath. EXAM: CT HEAD WITHOUT CONTRAST TECHNIQUE: Contiguous axial images were obtained from the base of the skull through the vertex without intravenous contrast. COMPARISON:  01/12/2016 FINDINGS: Brain: Stable mild age  related cerebral atrophy, ventriculomegaly and  periventricular white matter disease. No extra-axial fluid collections are identified. No CT findings for acute hemispheric infarction or intracranial hemorrhage. No mass lesions. The brainstem and cerebellum are normal. Vascular: No hyperdense vessels or obvious aneurysm. Stable minimal vascular calcifications. Skull: No skull fracture or bone lesion. Sinuses/Orbits: The paranasal sinuses and mastoid air cells are clear. The globes are intact. Other: No scalp lesions or hematoma. IMPRESSION: Stable mild age related cerebral atrophy, ventriculomegaly and periventricular white matter disease. No acute intracranial findings. Electronically Signed   By: Marijo Sanes M.D.   On: 06/10/2016 11:10   Ct Thoracic Spine Wo Contrast  Result Date: 06/10/2016 CLINICAL DATA:  History of compression fractures. Back pain since Monday. No known trauma. EXAM: CT THORACIC SPINE WITHOUT CONTRAST TECHNIQUE: Multidetector CT images of the thoracic were obtained using the standard protocol without intravenous contrast. COMPARISON:  None. FINDINGS: Alignment: Normal. Vertebrae: Chronic T3 and T5 vertebral body compression fractures without significant interval height loss compared with 01/13/2016. Interval development of a new T7 vertebral body compression fracture with approximately 15% height loss and without significant retropulsion of the posterior margin of the T7 vertebral body. No other vertebral body compression fracture. No aggressive osseous lesion. Paraspinal and other soft tissues: No paraspinal abnormality. Thoracic aortic atherosclerosis. Bibasilar atelectasis. Severe bilateral centrilobular and paraseptal emphysema. Disc levels: Disc spaces are relatively well maintained. No foraminal or central canal stenosis. IMPRESSION: 1. Acute versus subacute T7 vertebral body compression fracture with approximately 50% height loss. 2. Chronic T3 and T5 vertebral body compression fractures. 3.  Emphysema. (ICD10-J43.9) 4.  Aortic  Atherosclerosis (ICD10-170.0) Electronically Signed   By: Kathreen Devoid   On: 06/10/2016 11:11    EKG:   Orders placed or performed during the hospital encounter of 06/10/16  . ED EKG  . ED EKG  . EKG 12-Lead  . EKG 12-Lead    ASSESSMENT AND PLAN:  83 year old female patient with the COPD, GERD, osteoporosis admitted because of COPD exacerbation, acute respiratory failure with hypoxia with saturation 79% on room air when she came. She also found to have new T7 compression fracture with height loss.  #1 acute respiratory failure with hypoxia secondary to COPD exacerbation: Patient feels much better today, O2 levels improved to 92% on 2 L. Check Room air saturations at rest, ambulation to see if she needs oxygen at home, continue Solu-Medrol, nebulizers lungs are clear today without any wheezing.  #2 GERD continue PPIs  #3.acute  T7 compression fracture, with 15% height loss: Patient denies any abdominal ambulatory difficulties today. chronic T3, T5 compression fractures Consult orthopedic surgery for evaluation, and Continue lidoderm  patch, patient states that it's helping her.  4. Aphthous ulcers on tongue; continue  orajel, likley d/c am  All the records are reviewed and case discussed with Care Management/Social Workerr. Management plans discussed with the patient, family and they are in agreement.  CODE STATUS: full  TOTAL TIME TAKING CARE OF THIS PATIENT: 35 minutes.   POSSIBLE D/C IN 1-2DAYS, DEPENDING ON CLINICAL CONDITION.   Epifanio Lesches M.D on 06/11/2016 at 8:37 AM  Between 7am to 6pm - Pager - [phone]  After 6pm go to [email or web address] - password [email or web address]  Office  [phone]  CC: Primary care physician; [name], MD   Note: This dictation was prepared with Dragon dictation along with smaller phrase technology. Any transcriptional errors that result from this process are unintentional.

## 2016-06-11 NOTE — Consult Note (Signed)
ORTHOPAEDIC CONSULTATION  REQUESTING PHYSICIAN: Epifanio Lesches, MD  Chief Complaint:   Acute onset of upper back pain.  History of Present Illness: Mary Howe is a 83 y.o. female with a history of COPD, gastroesophageal reflux disease, hyperlipidemia, hypothyroidism, and osteoporosis who lives independently and normally is quite active, according to her daughter. The patient was in her usual state of health until about a week ago when she began to experience increased pain in her upper back. The patient notes that she had been doing a fair amount of lifting and carrying of objects what her symptoms first developed last week. Yesterday, her symptoms became much more acute, prompting her to present to her primary care provider. Evaluation by the primary care provider demonstrated hypoxia with 78% O2 sats, so the patient was referred to the emergency room for further evaluation and treatment. A thoracic CT scan in the emergency room demonstrated a mildly impacted acute/subacute compression fracture at T7 measuring approximately 15%. Because of the patient's discomfort and low oxygen saturations, the patient was admitted for medical management. Already, the patient is feeling better today in terms of her back pain. In fact, she was able to get up and going to the bathroom and perform basic hygiene, such as brushing her teeth and washing her face, without significant upper/mid back discomfort. She denies any numbness or paresthesias to either lower extremity, and denies any bowel or bladder complaints. She does have a history of other thoracic compression fractures involving T3 and T5 which she developed last October. She had been on Fosamax in the past but this medication had been discontinued by her primary care provider. She continues to take calcium supplements and a multivitamin at home.  Past Medical History:  Diagnosis Date  . COPD  (chronic obstructive pulmonary disease) ([place])   . Diverticulitis 2015  . GERD (gastroesophageal reflux disease)   . Hyperlipidemia   . Thyroid disease    Past Surgical History:  Procedure Laterality Date  . CERVICAL CONE BIOPSY    . CHOLECYSTECTOMY    . HEMORROIDECTOMY     Social History   Social History  . Marital status: Divorced    Spouse name: N/A  . Number of children: N/A  . Years of education: N/A   Occupational History  . retired    Social History Main Topics  . Smoking status: Former Research scientist (life sciences)  . Smokeless tobacco: Never Used     Comment: quit smoking in 1990  . Alcohol use 0.0 oz/week     Comment: OCCASIONALLY DRINKS WINE  . Drug use: No  . Sexual activity: Not Asked   Other Topics Concern  . None   Social History Narrative   ** Merged History Encounter **       Family History  Problem Relation Age of Onset  . Prostate cancer Father   . Pneumonia Brother   . Lung disease Brother   . CAD Brother   . Heart disease Brother   . CAD Brother   . Heart attack Brother   . Brain cancer Brother   . Bone cancer Sister   . Emphysema Sister   . Fibrocystic breast disease Sister   . Aneurysm Sister   . Heart disease Sister    No Known Allergies Prior to Admission medications   Medication Sig Start Date End Date Taking? Authorizing Provider  acetaminophen (TYLENOL) 500 MG tablet Take 500 mg by mouth every 6 (six) hours as needed.   Yes Historical Provider, MD  albuterol (PROVENTIL  HFA;VENTOLIN HFA) 108 (90 Base) MCG/ACT inhaler Inhale 1-2 puffs into the lungs every 4 (four) hours as needed for wheezing or shortness of breath. 04/20/16  Yes Wilhelmina Mcardle, MD  calcium carbonate (OS-CAL) 600 MG TABS tablet Take 600 mg by mouth daily with breakfast.  02/26/13  Yes Historical Provider, MD  diphenhydrAMINE (BENADRYL) 25 MG tablet Take 25 mg by mouth at bedtime as needed.   Yes Historical Provider, MD  MULTIPLE VITAMIN PO Take 1 tablet by mouth daily.  02/26/13  Yes  Historical Provider, MD  pantoprazole (PROTONIX) 40 MG tablet Take 1 tablet (40 mg total) by mouth daily. 06/01/15  Yes Richard Maceo Pro., MD  simvastatin (ZOCOR) 40 MG tablet Take 1 tablet (40 mg total) by mouth at bedtime. 06/01/15  Yes Richard Maceo Pro., MD  valACYclovir (VALTREX) 1000 MG tablet Take 1,000 mg by mouth 2 (two) times daily.  12/08/15  Yes Historical Provider, MD  fluocinonide cream (LIDEX) 0.05 % Apply 3 x day to back rash Patient not taking: Reported on 06/10/2016 12/21/15   Carmon Ginsberg, PA   Dg Chest 2 View  Result Date: 06/10/2016 CLINICAL DATA:  Shortness of breath and chest pain EXAM: CHEST  2 VIEW COMPARISON:  CT from earlier in the same day, 01/13/2016 FINDINGS: Cardiac shadow is mildly enlarged but stable. The lungs are well aerated bilaterally. Chronic fibrotic changes are noted similar to that seen on recent CT. T5 and T7 compression deformities are again noted similar to that seen on recent CTs. IMPRESSION: Chronic fibrotic changes and hyperinflation. T5 and T7 compression deformity similar to that seen on recent CT. Electronically Signed   By: Inez Catalina M.D.   On: 06/10/2016 11:15   Ct Head Wo Contrast  Result Date: 06/10/2016 CLINICAL DATA:  Headache and shortness of breath. EXAM: CT HEAD WITHOUT CONTRAST TECHNIQUE: Contiguous axial images were obtained from the base of the skull through the vertex without intravenous contrast. COMPARISON:  01/12/2016 FINDINGS: Brain: Stable mild age related cerebral atrophy, ventriculomegaly and periventricular white matter disease. No extra-axial fluid collections are identified. No CT findings for acute hemispheric infarction or intracranial hemorrhage. No mass lesions. The brainstem and cerebellum are normal. Vascular: No hyperdense vessels or obvious aneurysm. Stable minimal vascular calcifications. Skull: No skull fracture or bone lesion. Sinuses/Orbits: The paranasal sinuses and mastoid air cells are clear. The globes are  intact. Other: No scalp lesions or hematoma. IMPRESSION: Stable mild age related cerebral atrophy, ventriculomegaly and periventricular white matter disease. No acute intracranial findings. Electronically Signed   By: Marijo Sanes M.D.   On: 06/10/2016 11:10   Ct Thoracic Spine Wo Contrast  Result Date: 06/10/2016 CLINICAL DATA:  History of compression fractures. Back pain since Monday. No known trauma. EXAM: CT THORACIC SPINE WITHOUT CONTRAST TECHNIQUE: Multidetector CT images of the thoracic were obtained using the standard protocol without intravenous contrast. COMPARISON:  None. FINDINGS: Alignment: Normal. Vertebrae: Chronic T3 and T5 vertebral body compression fractures without significant interval height loss compared with 01/13/2016. Interval development of a new T7 vertebral body compression fracture with approximately 15% height loss and without significant retropulsion of the posterior margin of the T7 vertebral body. No other vertebral body compression fracture. No aggressive osseous lesion. Paraspinal and other soft tissues: No paraspinal abnormality. Thoracic aortic atherosclerosis. Bibasilar atelectasis. Severe bilateral centrilobular and paraseptal emphysema. Disc levels: Disc spaces are relatively well maintained. No foraminal or central canal stenosis. IMPRESSION: 1. Acute versus subacute T7 vertebral body compression fracture with  approximately 50% height loss. 2. Chronic T3 and T5 vertebral body compression fractures. 3.  Emphysema. (ICD10-J43.9) 4.  Aortic Atherosclerosis (ICD10-170.0) Electronically Signed   By: Kathreen Devoid   On: 06/10/2016 11:11    Positive ROS: All other systems have been reviewed and were otherwise negative with the exception of those mentioned in the HPI and as above.  Physical Exam: General:  Alert, no acute distress Psychiatric:  Patient is competent for consent with normal mood and affect   Cardiovascular:  No pedal edema Respiratory:  No wheezing,  non-labored breathing GI:  Abdomen is soft and non-tender Skin:  No lesions in the area of chief complaint Neurologic:  Sensation intact distally Lymphatic:  No axillary or cervical lymphadenopathy  Orthopedic Exam:  Orthopedic examination is limited to her thoracic spine. She has no tenderness to percussion along the thoracic spine, but does have moderate focal tenderness to palpation 2-3 inches lateral to approximately the T7 level in the area of the inferior angle of the left scapula. No crepitance of the rib is noted in this area. She is able to sit up in bed and roll from side to side without significant discomfort. She is neurovascular intact in both lower extremities.  X-rays:  A recent CT scan of the thoracic spine is available for review. The findings are as described above. By my review, the amount of vertebral compression at T7 is approximately 10-15%.  Assessment: Acute/subacute T7 compression fracture with mild impaction.  Plan: The treatment options were discussed with the patient and her daughter, who is at the patient's bedside. Given her improved mobility and limited symptoms, as well as the mild amount of compression noted on CT scan, I do not feel that more aggressive treatment such as a kyphoplasty is indicated at this time. I feel that the patient can be mobilized as symptoms permit. She may continue to use Lidoderm patches and Tylenol as necessary for discomfort. She may contact our office for a follow-up appointment in 2-3 weeks if her symptoms do not improve.  Thank you for asking me to participate in the care of this most pleasant woman. Please contact me if additional orthopedic input is necessary.   Pascal Lux, MD  Beeper #:  [phone]  06/11/2016 10:44 AM

## 2016-06-12 MED ORDER — PREDNISONE 10 MG (21) PO TBPK: ORAL_TABLET | ORAL | 0 refills | Status: DC

## 2016-06-12 MED ORDER — LIDOCAINE 5 % EX PTCH: 1.0000 | MEDICATED_PATCH | CUTANEOUS | 0 refills | Status: DC

## 2016-06-12 MED ORDER — BENZOCAINE 10 % MT GEL: Freq: Three times a day (TID) | OROMUCOSAL | 0 refills | Status: DC | PRN

## 2016-06-12 NOTE — Progress Notes (Signed)
Late entry: Clarification of home O2 sats done yesterday.  NT walked patient 06/11/2016 to evaluate O2 sats for home oxygen. Nurse was told that when patient was at rest in bed O2 sats were 98%. Patient then ambulated and sats dropped to 86% while patient walked and talked to NT. Patient stopped talking and concentrated on her breathing instead of talking and sats went to 96%. When patient ambulated with 2L O2 sats were at 96%.

## 2016-06-12 NOTE — Discharge Summary (Signed)
Mary Howe, is a 83 y.o. female  DOB 1933/01/09  MRN [id number].  Admission date:  06/10/2016  Admitting Physician  Nicholes Mango, MD  Discharge Date:  06/12/2016   Primary MD  [name], MD  Recommendations for primary care physician for things to follow:   Follow-up with primary doctor in 1 week   Admission Diagnosis  Hypoxia [R09.02] COPD exacerbation ([place]) [J44.1] Compression fracture of body of thoracic vertebra [facility]) [id number]   Discharge Diagnosis  Hypoxia [R09.02] COPD exacerbation ([place]) [J44.1] Compression fracture of body of thoracic vertebra ([facility]) [id number]    Active Problems:   COPD with acute exacerbation ([place])      Past Medical History:  Diagnosis Date  . COPD (chronic obstructive pulmonary disease) ([place])   . Diverticulitis 2015  . GERD (gastroesophageal reflux disease)   . Hyperlipidemia   . Thyroid disease     Past Surgical History:  Procedure Laterality Date  . CERVICAL CONE BIOPSY    . CHOLECYSTECTOMY    . HEMORROIDECTOMY         History of present illness and  Hospital Course:     Kindly see H&P for history of present illness and admission details, please review complete Labs, Consult reports and Test reports for all details in brief  HPI  from the history and physical done on the day of admission 83 year old female patient with history of COPD, GERD admitted because of back pain, shortness of breath, hypoxia with saturation 79% on room air.   Hospital Course  Respiratory failure with hypoxia secondary to COPD exacerbation: Patient received IV Solu-Medrol, oxygen, nebulizers. Saturation improved to 94% on room air. Patient desatted to 86% on walking yesterday on room air, when she was not talking saturations were more than 90% while ambulating but desatted to 86% while talking  on ambulation. Patient refused home oxygen set up. Patient is to follow-up with primary doctor Dr. to evaluate for oxygen need. I believe it is transient due  COPD flareup. Her lungs are completely clear. Patient able to talk full sentences without any shortness of breath.  she is alert awake and oriented.  discharge home with tapering course of prednisone.  #2/COPD; H&P and continue her Proventil,  #3 GERD: Continue PPIs  Hyperlipidemia: Statins  Acute T7 compression fracture with 15% height loss,  seen by orthopedic physician. He shouldn't mobility improved with limited symptoms, ambulating well. [facility] did not recommend any surgery. Patient wanted to continue Lidoderm patches. I wrote prescription for Lidoderm patch. Can follow up with  Ortho  in 2-3 weeks if symptoms of back pain, difficulty with ambulation happen.  #6 essential hypertension: Without history of blood pressure before:  May be elevated BP due to steroids.Blood pressure is elevated to systolic [id number]. Patient told me that she has no history of blood pressure and does not want medicines told her to check the blood pressure at home at least once or twice a day and that takes with the primary doctor Dr. Deborra Medina to evaluate and start the BP medicine.  Discharge Condition: stable   Follow UP      Discharge Instructions  and  Discharge Medications      Allergies as of 06/12/2016   No Known Allergies     Medication List    STOP taking these medications   fluocinonide cream 0.05 % Commonly known as:  LIDEX     TAKE these medications   acetaminophen 500 MG tablet Commonly known as:  TYLENOL Take  500 mg by mouth every 6 (six) hours as needed.   albuterol 108 (90 Base) MCG/ACT inhaler Commonly known as:  PROVENTIL HFA;VENTOLIN HFA Inhale 1-2 puffs into the lungs every 4 (four) hours as needed for wheezing or shortness of breath.   benzocaine 10 % mucosal gel Commonly known as:  ORAJEL Use as directed in  the mouth or throat 3 (three) times daily as needed for mouth pain.   calcium carbonate 600 MG Tabs tablet Commonly known as:  OS-CAL Take 600 mg by mouth daily with breakfast.   diphenhydrAMINE 25 MG tablet Commonly known as:  BENADRYL Take 25 mg by mouth at bedtime as needed.   lidocaine 5 % Commonly known as:  LIDODERM Place 1 patch onto the skin daily. Remove & Discard patch within 12 hours or as directed by MD   MULTIPLE VITAMIN PO Take 1 tablet by mouth daily.   pantoprazole 40 MG tablet Commonly known as:  PROTONIX Take 1 tablet (40 mg total) by mouth daily.   predniSONE 10 MG (21) Tbpk tablet Commonly known as:  STERAPRED UNI-PAK 21 TAB Taper as directed.   simvastatin 40 MG tablet Commonly known as:  ZOCOR Take 1 tablet (40 mg total) by mouth at bedtime.   valACYclovir 1000 MG tablet Commonly known as:  VALTREX Take 1,000 mg by mouth 2 (two) times daily.         Diet and Activity recommendation: See Discharge Instructions above   Consults obtained - ortho   Major procedures and Radiology Reports - PLEASE review detailed and final reports for all details, in brief -      Dg Chest 2 View  Result Date: 06/10/2016 CLINICAL DATA:  Shortness of breath and chest pain EXAM: CHEST  2 VIEW COMPARISON:  CT from earlier in the same day, 01/13/2016 FINDINGS: Cardiac shadow is mildly enlarged but stable. The lungs are well aerated bilaterally. Chronic fibrotic changes are noted similar to that seen on recent CT. T5 and T7 compression deformities are again noted similar to that seen on recent CTs. IMPRESSION: Chronic fibrotic changes and hyperinflation. T5 and T7 compression deformity similar to that seen on recent CT. Electronically Signed   By: Inez Catalina M.D.   On: 06/10/2016 11:15   Ct Head Wo Contrast  Result Date: 06/10/2016 CLINICAL DATA:  Headache and shortness of breath. EXAM: CT HEAD WITHOUT CONTRAST TECHNIQUE: Contiguous axial images were obtained from the  base of the skull through the vertex without intravenous contrast. COMPARISON:  01/12/2016 FINDINGS: Brain: Stable mild age related cerebral atrophy, ventriculomegaly and periventricular white matter disease. No extra-axial fluid collections are identified. No CT findings for acute hemispheric infarction or intracranial hemorrhage. No mass lesions. The brainstem and cerebellum are normal. Vascular: No hyperdense vessels or obvious aneurysm. Stable minimal vascular calcifications. Skull: No skull fracture or bone lesion. Sinuses/Orbits: The paranasal sinuses and mastoid air cells are clear. The globes are intact. Other: No scalp lesions or hematoma. IMPRESSION: Stable mild age related cerebral atrophy, ventriculomegaly and periventricular white matter disease. No acute intracranial findings. Electronically Signed   By: Marijo Sanes M.D.   On: 06/10/2016 11:10   Ct Thoracic Spine Wo Contrast  Result Date: 06/10/2016 CLINICAL DATA:  History of compression fractures. Back pain since Monday. No known trauma. EXAM: CT THORACIC SPINE WITHOUT CONTRAST TECHNIQUE: Multidetector CT images of the thoracic were obtained using the standard protocol without intravenous contrast. COMPARISON:  None. FINDINGS: Alignment: Normal. Vertebrae: Chronic T3 and T5 vertebral body compression  fractures without significant interval height loss compared with 01/13/2016. Interval development of a new T7 vertebral body compression fracture with approximately 15% height loss and without significant retropulsion of the posterior margin of the T7 vertebral body. No other vertebral body compression fracture. No aggressive osseous lesion. Paraspinal and other soft tissues: No paraspinal abnormality. Thoracic aortic atherosclerosis. Bibasilar atelectasis. Severe bilateral centrilobular and paraseptal emphysema. Disc levels: Disc spaces are relatively well maintained. No foraminal or central canal stenosis. IMPRESSION: 1. Acute versus subacute T7  vertebral body compression fracture with approximately 50% height loss. 2. Chronic T3 and T5 vertebral body compression fractures. 3.  Emphysema. (ICD10-J43.9) 4.  Aortic Atherosclerosis (ICD10-170.0) Electronically Signed   By: Kathreen Devoid   On: 06/10/2016 11:11    Micro Results     No results found for this or any previous visit (from the past 240 hour(s)).     Today   Subjective:   Mary Howe today has no sob.eager to go home. Blood pressure (!) 173/75, pulse 91, temperature 97.8 F (36.6 C), temperature source Oral, resp. rate 18, height 5\' 4"  (1.626 m), weight 61.2 kg (134 lb 14.4 oz), SpO2 94 %.   Intake/Output Summary (Last 24 hours) at 06/12/16 1207 Last data filed at 06/12/16 0800  Gross per 24 hour  Intake             1200 ml  Output                0 ml  Net             1200 ml    Exam Awake Alert, Oriented x 3, No new F.N deficits, Normal affect [place].AT,PERRAL Supple Neck,No JVD, No cervical lymphadenopathy appriciated.  Symmetrical Chest wall movement, Good air movement bilaterally, CTAB RRR,No Gallops,Rubs or new Murmurs, No Parasternal Heave +ve B.Sounds, Abd Soft, Non tender, No organomegaly appriciated, No rebound -guarding or rigidity. No Cyanosis, Clubbing or edema, No new Rash or bruise  Data Review   CBC w Diff: Lab Results  Component Value Date   WBC 13.9 (H) 06/10/2016   HGB 14.4 06/10/2016   HCT 41.7 06/10/2016   HCT 43.3 03/23/2015   PLT 230 06/10/2016   PLT 246 03/23/2015   LYMPHOPCT 9 06/10/2016   MONOPCT 5 06/10/2016   EOSPCT 1 06/10/2016   BASOPCT 0 06/10/2016    CMP: Lab Results  Component Value Date   NA 132 (L) 06/10/2016   NA 139 03/23/2015   K 3.7 06/10/2016   CL 98 (L) 06/10/2016   CO2 26 06/10/2016   BUN 13 06/10/2016   BUN 7 (L) 03/23/2015   CREATININE 0.55 06/10/2016   GLU 102 01/14/2014   PROT 7.0 06/10/2016   PROT 7.0 03/23/2015   ALBUMIN 4.0 06/10/2016   ALBUMIN 4.5 03/23/2015   BILITOT 0.8 06/10/2016    BILITOT 0.5 03/23/2015   ALKPHOS 58 06/10/2016   AST 26 06/10/2016   ALT 21 06/10/2016  .   Total Time in preparing paper work, data evaluation and todays exam - 64 minutes  Johnie Stadel M.D on 06/12/2016 at 12:07 PM    Note: This dictation was prepared with Dragon dictation along with smaller phrase technology. Any transcriptional errors that result from this process are unintentional.

## 2016-06-12 NOTE — Progress Notes (Signed)
Not informed about ambulatory oxygen saturation dropped to below 86% on room air, patient O2 sats on ambulation when not talking were more than 90% according to discussion with patient's nurse. And when she whenever she talked oxygen saturation dropped to 86% but I'm not aware of these results and patient already left the facility. I called the [facility]  case manager to see what we have to do. (may be set up Home RN to check and see pule oxy and set  up home o2 if needed.

## 2016-06-12 NOTE — Progress Notes (Addendum)
SATURATION QUALIFICATIONS: (This note is used to comply with regulatory documentation for home oxygen)  Patient Saturations on Room Air at Rest = 98%  Patient Saturations on Room Air while Ambulating = 86%   Patient Saturations on 2Liters of oxygen while Ambulating =96%  Please briefly explain why patient needs home oxygen: Patient will desat while walking.    Late entry: Patient's O2 sats were 86% walking and talking without oxygen. When patient concentrated on her breathing without talking she maintained 96% on room air.

## 2016-06-12 NOTE — Progress Notes (Signed)
Spoke to patients daughter , patient had PFTs done by Dr. Rosita Fire. Told  about hypoxia with 86% sats on ambulation, advised her to follow up with Dr. Derrel Nip tomorrow about this.daughter is appreciative of this. D/w HTN ,she does not want prescription for BP medicine.

## 2016-06-13 ENCOUNTER — Telehealth: Payer: Self-pay | Admitting: *Deleted

## 2016-06-13 LAB — VITAMIN D 25 HYDROXY (VIT D DEFICIENCY, FRACTURES): Vit D, 25-Hydroxy: 34.2 ng/mL (ref 30.0–100.0)

## 2016-06-13 NOTE — Telephone Encounter (Signed)
Pt discharged from [facility] on 06/13/15. Pt will need a time and date for a HFU.

## 2016-06-13 NOTE — Telephone Encounter (Signed)
Pt called back returning your call. Please call pt @ [phone]. Thank you!

## 2016-06-13 NOTE — Telephone Encounter (Signed)
First TCM attempt made, no answer left detailed message to call office.

## 2016-06-14 NOTE — Telephone Encounter (Signed)
Transition Care Management Follow-up Telephone Call  How have you been since you were released from the hospital? Patient DPR (daughter) stated she is doing better , BP has been checked , nor, 02 sats.   Do you understand why you were in the hospital?Yes per DPR.   Do you understand the discharge instrcutions? Yes  Items Reviewed:  Medications reviewed: Yes  Allergies reviewed: Yes  Dietary changes reviewed:Yes  Referrals reviewed: Yes   Functional Questionnaire:   Activities of Daily Living (ADLs):   She states they are independent in the following: Patient is able to attend to all ADLs independantly. States they require assistance with the following: Patient needs no assist.   Any transportation issues/concerns?: No   Any patient concerns? No   Confirmed importance and date/time of follow-up visits scheduled: Yes   Confirmed with patient if condition begins to worsen call PCP or go to the ER.  Patient was given the Call-a-Nurse line [phone]

## 2016-06-14 NOTE — Telephone Encounter (Signed)
DPR mentioned while on phone as to some concern during admission of patient may need to be discharged on 02 which patient declined. FYI. Patient scheduled for 06/22/16 @ 11:30

## 2016-06-16 ENCOUNTER — Telehealth: Payer: Self-pay | Admitting: Pulmonary Disease

## 2016-06-16 NOTE — Telephone Encounter (Signed)
Has new compression fracture. Pt went to fire dept to have her BP checked and O2 checked. Per EMS hands are blue and cold and sats were 84% and states when EMS arrived she was 92% on RA SR with occasionally PVCs. Per DR pt should go to ER to be checked out. EMS states that pt denies any other symptoms and they have suggested to take her to ER but pt is refusing. EMS [employer] is stating they will inform pt and daughter she should go to ER to be evaluated per our office. Informed to have daughter or pt to call back with any further questions.

## 2016-06-16 NOTE — Telephone Encounter (Signed)
Please call Gwynne at [phone]. Her mothers Oxygen stats have dropped to 76 and got up to 66. This happend last week. Not sure what to do? No other symptoms.

## 2016-06-17 DIAGNOSIS — R0902 Hypoxemia: Secondary | ICD-10-CM | POA: Diagnosis not present

## 2016-06-22 ENCOUNTER — Ambulatory Visit (INDEPENDENT_AMBULATORY_CARE_PROVIDER_SITE_OTHER): Payer: Medicare Other | Admitting: Internal Medicine

## 2016-06-22 ENCOUNTER — Encounter: Payer: Self-pay | Admitting: Internal Medicine

## 2016-06-22 ENCOUNTER — Other Ambulatory Visit: Payer: Self-pay | Admitting: Family Medicine

## 2016-06-22 DIAGNOSIS — F411 Generalized anxiety disorder: Secondary | ICD-10-CM

## 2016-06-22 DIAGNOSIS — M8008XK Age-related osteoporosis with current pathological fracture, vertebra(e), subsequent encounter for fracture with nonunion: Secondary | ICD-10-CM

## 2016-06-22 DIAGNOSIS — J9601 Acute respiratory failure with hypoxia: Secondary | ICD-10-CM

## 2016-06-22 DIAGNOSIS — I10 Essential (primary) hypertension: Secondary | ICD-10-CM

## 2016-06-22 DIAGNOSIS — K219 Gastro-esophageal reflux disease without esophagitis: Secondary | ICD-10-CM

## 2016-06-22 DIAGNOSIS — Z09 Encounter for follow-up examination after completed treatment for conditions other than malignant neoplasm: Secondary | ICD-10-CM

## 2016-06-22 DIAGNOSIS — E7800 Pure hypercholesterolemia, unspecified: Secondary | ICD-10-CM

## 2016-06-22 MED ORDER — ALENDRONATE SODIUM 70 MG PO TABS: 70.0000 mg | ORAL_TABLET | ORAL | 11 refills | Status: DC

## 2016-06-22 MED ORDER — ESCITALOPRAM OXALATE 10 MG PO TABS
10.0000 mg | ORAL_TABLET | Freq: Every day | ORAL | 5 refills | Status: DC
Start: 2016-06-22 — End: 2016-12-16

## 2016-06-22 MED ORDER — PANTOPRAZOLE SODIUM 40 MG PO TBEC: 40.0000 mg | DELAYED_RELEASE_TABLET | Freq: Every day | ORAL | 1 refills | Status: DC

## 2016-06-22 MED ORDER — LOSARTAN POTASSIUM 25 MG PO TABS: 25.0000 mg | ORAL_TABLET | Freq: Every day | ORAL | 5 refills | Status: DC

## 2016-06-22 MED ORDER — SIMVASTATIN 40 MG PO TABS: 40.0000 mg | ORAL_TABLET | Freq: Every day | ORAL | 1 refills | Status: DC

## 2016-06-22 NOTE — Assessment & Plan Note (Addendum)
She continues to have an ambulatory oxygen requirement due to confirmed desaturation todayfrom 96% at rest  to 87% after 3 minutes of ambulating on room air, which  corrects to 98% with 2 L. O2.  Portable tank needed to prevent deterioration of condition

## 2016-06-22 NOTE — Assessment & Plan Note (Signed)
Patient is stable post discharge and has no new issues or questions about discharge plans at the visit today for hospital follow up. All labs , imaging studies and progress notes from admission were reviewed with patient today   

## 2016-06-22 NOTE — Patient Instructions (Signed)
1) Hypertension:  I am starting you on Losartan 25 mg one tablet daily  Return in one week for a BP check and a blood test    2) Anxiety/depression:  Starting generic Lexapro  Please start the Lexapro (escitalopram) at 1/2 tablet daily in the evening for the first few days to avoid nausea.  You can increase to a full tablet after 4 days if you havenot developed side effects of nausea.  If the lexapro interferes with your sleep, you can take it in the morning instead  3) Osteoporosis:  Yo have it in your spine.  That's why your broke a vertebrae so easily.  Starting generic Fosamax (alendronate)  .Marland Kitchen Take the medication ONCE A WEEK ON AN EMPTY STOMACH WITH A FULL GLASS OF WATER REMAIN SITTING UP FOR AT LEAST AND HOUR,  AVOIDING ANY FOOD OR DRINK OTHER THAN WATER FOR AN HOUR   1000 Ius of Vitamin D 3 daily .  1800 mg calcium daily through diet and supplements   Alendronate tablets What is this medicine? ALENDRONATE (a LEN droe nate) slows calcium loss from bones. It helps to make normal healthy bone and to slow bone loss in people with Paget's disease and osteoporosis. It may be used in others at risk for bone loss. This medicine may be used for other purposes; ask your health care provider or pharmacist if you have questions. COMMON BRAND NAME(S): Fosamax What should I tell my health care provider before I take this medicine? They need to know if you have any of these conditions: -dental disease -esophagus, stomach, or intestine problems, like acid reflux or GERD -kidney disease -low blood calcium -low vitamin D -problems sitting or standing 30 minutes -trouble swallowing -an unusual or allergic reaction to alendronate, other medicines, foods, dyes, or preservatives -pregnant or trying to get pregnant -breast-feeding How should I use this medicine? You must take this medicine exactly as directed or you will lower the amount of the medicine you absorb into your body or you may cause  yourself harm. Take this medicine by mouth first thing in the morning, after you are up for the day. Do not eat or drink anything before you take your medicine. Swallow the tablet with a full glass (6 to 8 fluid ounces) of plain water. Do not take this medicine with any other drink. Do not chew or crush the tablet. After taking this medicine, do not eat breakfast, drink, or take any medicines or vitamins for at least 30 minutes. Sit or stand up for at least 30 minutes after you take this medicine; do not lie down. Do not take your medicine more often than directed. Talk to your pediatrician regarding the use of this medicine in children. Special care may be needed. Overdosage: If you think you have taken too much of this medicine contact a poison control center or emergency room at once. NOTE: This medicine is only for you. Do not share this medicine with others. What if I miss a dose? If you miss a dose, do not take it later in the day. Continue your normal schedule starting the next morning. Do not take double or extra doses. What may interact with this medicine? -aluminum hydroxide -antacids -aspirin -calcium supplements -drugs for inflammation like ibuprofen, naproxen, and others -iron supplements -magnesium supplements -vitamins with minerals This list may not describe all possible interactions. Give your health care provider a list of all the medicines, herbs, non-prescription drugs, or dietary supplements you use. Also tell  them if you smoke, drink alcohol, or use illegal drugs. Some items may interact with your medicine. What should I watch for while using this medicine? Visit your doctor or health care professional for regular checks ups. It may be some time before you see benefit from this medicine. Do not stop taking your medicine except on your doctor's advice. Your doctor or health care professional may order blood tests and other tests to see how you are doing. You should make sure  you get enough calcium and vitamin D while you are taking this medicine, unless your doctor tells you not to. Discuss the foods you eat and the vitamins you take with your health care professional. Some people who take this medicine have severe bone, joint, and/or muscle pain. This medicine may also increase your risk for a broken thigh bone. Tell your doctor right away if you have pain in your upper leg or groin. Tell your doctor if you have any pain that does not go away or that gets worse. This medicine can make you more sensitive to the sun. If you get a rash while taking this medicine, sunlight may cause the rash to get worse. Keep out of the sun. If you cannot avoid being in the sun, wear protective clothing and use sunscreen. Do not use sun lamps or tanning beds/booths. What side effects may I notice from receiving this medicine? Side effects that you should report to your doctor or health care professional as soon as possible: -allergic reactions like skin rash, itching or hives, swelling of the face, lips, or tongue -black or tarry stools -bone, muscle or joint pain -changes in vision -chest pain -heartburn or stomach pain -jaw pain, especially after dental work -pain or trouble when swallowing -redness, blistering, peeling or loosening of the skin, including inside the mouth Side effects that usually do not require medical attention (report to your doctor or health care professional if they continue or are bothersome): -changes in taste -diarrhea or constipation -eye pain or itching -headache -nausea or vomiting -stomach gas or fullness This list may not describe all possible side effects. Call your doctor for medical advice about side effects. You may report side effects to FDA at 1-800-FDA-1088. Where should I keep my medicine? Keep out of the reach of children. Store at room temperature of 15 and 30 degrees C (59 and 86 degrees F). Throw away any unused medicine after the  expiration date. NOTE: This sheet is a summary. It may not cover all possible information. If you have questions about this medicine, talk to your doctor, pharmacist, or health care provider.  2018 Elsevier/Gold Standard (2010-09-24 08:56:09)

## 2016-06-22 NOTE — Progress Notes (Signed)
Subjective:  Patient ID: Mary Howe, female    DOB: 13-Aug-1932  Age: 83 y.o. MRN: [id number]  CC: Diagnoses of Gastroesophageal reflux disease, esophagitis presence not specified, Hypercholesterolemia, Acute respiratory failure with hypoxia ([place]), Age-related osteoporosis with current pathological fracture of vertebra with nonunion, subsequent encounter, Essential hypertension, Anxiety, generalized, and Hospital discharge follow-up were pertinent to this visit.  HPI Paticia Howe presents for hospital follow up . Admitted to [facility] on March 4 with COPD exacerbation, hypoxic respiratory  failure,  And severe back pain secondary to compression fracture of T7 with 15% weight loss.  She was treated with IV steroids,  Supplemental oxygen and nebulized bronchodilators.  Her hypoxia improved but she continued to require supplemental oxygen for ambulatorydesaturations on day of discharge  but  declined .  She was seen by Ortho for evaluation of fracture and no surgical intervention was not advised. She has been using Lidocaine patches and tylenol for pain control.   Her blood pressure was noted to be elevated but she refused medications  as well.  She was discharged on arch 4 to home.  She has been  Having a difficult time since she has been home because of her overbearing sister.  Wearing lidocaine patches for management of her T7 pain.  Taking 3 tylenol daily .  The fracture occurred after having a  minor fall on feb 15th,  While out of town.   THE PAIN STARTED A FEW WEEKS LATER WHILE scrubbing  the patio  With a hand held brush DEXA SCAN NOV 2017 REVIEWED  Discussed alendronate vs Prolia  Alendronate trial   Outpatient Medications Prior to Visit  Medication Sig Dispense Refill  . acetaminophen (TYLENOL) 500 MG tablet Take 500 mg by mouth every 6 (six) hours as needed.    Marland Kitchen albuterol (PROVENTIL HFA;VENTOLIN HFA) 108 (90 Base) MCG/ACT inhaler Inhale 1-2 puffs into the lungs every 4 (four) hours  as needed for wheezing or shortness of breath. 1 Inhaler 10  . benzocaine (ORAJEL) 10 % mucosal gel Use as directed in the mouth or throat 3 (three) times daily as needed for mouth pain. 5.3 g 0  . calcium carbonate (OS-CAL) 600 MG TABS tablet Take 600 mg by mouth daily with breakfast.     . diphenhydrAMINE (BENADRYL) 25 MG tablet Take 25 mg by mouth at bedtime as needed.    . lidocaine (LIDODERM) 5 % Place 1 patch onto the skin daily. Remove & Discard patch within 12 hours or as directed by MD 30 patch 0  . MULTIPLE VITAMIN PO Take 1 tablet by mouth daily.     . predniSONE (STERAPRED UNI-PAK 21 TAB) 10 MG (21) TBPK tablet Taper as directed. 21 tablet 0  . valACYclovir (VALTREX) 1000 MG tablet Take 1,000 mg by mouth 2 (two) times daily.     . pantoprazole (PROTONIX) 40 MG tablet Take 1 tablet (40 mg total) by mouth daily. 90 tablet 3  . simvastatin (ZOCOR) 40 MG tablet Take 1 tablet (40 mg total) by mouth at bedtime. 90 tablet 3   No facility-administered medications prior to visit.     Review of Systems;  Patient denies headache, fevers, malaise, unintentional weight loss, skin rash, eye pain, sinus congestion and sinus pain, sore throat, dysphagia,  hemoptysis , cough, dyspnea, wheezing, chest pain, palpitations, orthopnea, edema, abdominal pain, nausea, melena, diarrhea, constipation, flank pain, dysuria, hematuria, urinary  Frequency, nocturia, numbness, tingling, seizures,  Focal weakness, Loss of consciousness,  Tremor, insomnia, depression, anxiety, and  suicidal ideation.      Objective:  BP (!) 152/80 (BP Location: Left Arm, Patient Position: Sitting, Cuff Size: Normal)   Pulse 90   Resp 17   Ht 5\' 4"  (1.626 m)   Wt 133 lb 6.4 oz (60.5 kg)   SpO2 94%   BMI 22.90 kg/m   BP Readings from Last 3 Encounters:  06/22/16 (!) 152/80  06/12/16 (!) 173/75  06/10/16 130/70    Wt Readings from Last 3 Encounters:  06/22/16 133 lb 6.4 oz (60.5 kg)  06/10/16 134 lb 14.4 oz (61.2 kg)    06/10/16 137 lb 3.2 oz (62.2 kg)    General appearance: alert, cooperative and appears stated age Ears: normal TM's and external ear canals both ears Throat: lips, mucosa, and tongue normal; teeth and gums normal Neck: no adenopathy, no carotid bruit, supple, symmetrical, trachea midline and thyroid not enlarged, symmetric, no tenderness/mass/nodules Back: symmetric, no curvature. ROM normal. No CVA tenderness. Lungs: clear to auscultation bilaterally Heart: regular rate and rhythm, S1, S2 normal, no murmur, click, rub or gallop Abdomen: soft, non-tender; bowel sounds normal; no masses,  no organomegaly Pulses: 2+ and symmetric Skin: Skin color, texture, turgor normal. No rashes or lesions Lymph nodes: Cervical, supraclavicular, and axillary nodes normal.  Lab Results  Component Value Date   HGBA1C 5.9 (H) 01/12/2016   HGBA1C 5.9 10/21/2013    Lab Results  Component Value Date   CREATININE 0.55 06/10/2016   CREATININE 0.55 01/28/2016   CREATININE 0.48 01/13/2016    Lab Results  Component Value Date   WBC 13.9 (H) 06/10/2016   HGB 14.4 06/10/2016   HCT 41.7 06/10/2016   PLT 230 06/10/2016   GLUCOSE 119 (H) 06/10/2016   CHOL 183 01/28/2016   TRIG 105.0 01/28/2016   HDL 84.20 01/28/2016   LDLCALC 78 01/28/2016   ALT 21 06/10/2016   AST 26 06/10/2016   NA 132 (L) 06/10/2016   K 3.7 06/10/2016   CL 98 (L) 06/10/2016   CREATININE 0.55 06/10/2016   BUN 13 06/10/2016   CO2 26 06/10/2016   TSH 4.38 01/28/2016   HGBA1C 5.9 (H) 01/12/2016   MICROALBUR <0.7 01/28/2016    Dg Chest 2 View  Result Date: 06/10/2016 CLINICAL DATA:  Shortness of breath and chest pain EXAM: CHEST  2 VIEW COMPARISON:  CT from earlier in the same day, 01/13/2016 FINDINGS: Cardiac shadow is mildly enlarged but stable. The lungs are well aerated bilaterally. Chronic fibrotic changes are noted similar to that seen on recent CT. T5 and T7 compression deformities are again noted similar to that seen on  recent CTs. IMPRESSION: Chronic fibrotic changes and hyperinflation. T5 and T7 compression deformity similar to that seen on recent CT. Electronically Signed   By: Inez Catalina M.D.   On: 06/10/2016 11:15   Ct Head Wo Contrast  Result Date: 06/10/2016 CLINICAL DATA:  Headache and shortness of breath. EXAM: CT HEAD WITHOUT CONTRAST TECHNIQUE: Contiguous axial images were obtained from the base of the skull through the vertex without intravenous contrast. COMPARISON:  01/12/2016 FINDINGS: Brain: Stable mild age related cerebral atrophy, ventriculomegaly and periventricular white matter disease. No extra-axial fluid collections are identified. No CT findings for acute hemispheric infarction or intracranial hemorrhage. No mass lesions. The brainstem and cerebellum are normal. Vascular: No hyperdense vessels or obvious aneurysm. Stable minimal vascular calcifications. Skull: No skull fracture or bone lesion. Sinuses/Orbits: The paranasal sinuses and mastoid air cells are clear. The globes are intact. Other: No scalp  lesions or hematoma. IMPRESSION: Stable mild age related cerebral atrophy, ventriculomegaly and periventricular white matter disease. No acute intracranial findings. Electronically Signed   By: Marijo Sanes M.D.   On: 06/10/2016 11:10   Ct Thoracic Spine Wo Contrast  Result Date: 06/10/2016 CLINICAL DATA:  History of compression fractures. Back pain since Monday. No known trauma. EXAM: CT THORACIC SPINE WITHOUT CONTRAST TECHNIQUE: Multidetector CT images of the thoracic were obtained using the standard protocol without intravenous contrast. COMPARISON:  None. FINDINGS: Alignment: Normal. Vertebrae: Chronic T3 and T5 vertebral body compression fractures without significant interval height loss compared with 01/13/2016. Interval development of a new T7 vertebral body compression fracture with approximately 15% height loss and without significant retropulsion of the posterior margin of the T7 vertebral  body. No other vertebral body compression fracture. No aggressive osseous lesion. Paraspinal and other soft tissues: No paraspinal abnormality. Thoracic aortic atherosclerosis. Bibasilar atelectasis. Severe bilateral centrilobular and paraseptal emphysema. Disc levels: Disc spaces are relatively well maintained. No foraminal or central canal stenosis. IMPRESSION: 1. Acute versus subacute T7 vertebral body compression fracture with approximately 50% height loss. 2. Chronic T3 and T5 vertebral body compression fractures. 3.  Emphysema. (ICD10-J43.9) 4.  Aortic Atherosclerosis (ICD10-170.0) Electronically Signed   By: Kathreen Devoid   On: 06/10/2016 11:11    Assessment & Plan:   Problem List Items Addressed This Visit    Acid reflux   Relevant Medications   pantoprazole (PROTONIX) 40 MG tablet   Acute respiratory failure with hypoxia ([place])    She continues to have an ambulatory oxygen requirement due to confirmed desaturation todayfrom 96% at rest  to 87% after 3 minutes of ambulating on room air, which  corrects to 98% with 2 L. O2.  Portable tank needed to prevent deterioration of condition        Age-related osteoporosis with current pathol fracture of vertebra ([facility])    Therapy options dicussed.  Starting alendronate weekly  Lab Results  Component Value Date   CREATININE 0.55 06/10/2016         Relevant Medications   alendronate (FOSAMAX) 70 MG tablet   Anxiety, generalized    Aggravated by health issues and overbearing sister. STARTING Lexapro at 5 mg daily.       Essential hypertension    Starting losartan 25 mg daily.   RTC in one week for RN visit and bmet.       Relevant Medications   simvastatin (ZOCOR) 40 MG tablet   losartan (COZAAR) 25 MG tablet   Hospital discharge follow-up    Patient is stable post discharge and has no new issues or questions about discharge plans at the visit today for hospital follow up. All labs , imaging studies and progress notes from admission  were reviewed with patient today        Hypercholesterolemia   Relevant Medications   simvastatin (ZOCOR) 40 MG tablet   losartan (COZAAR) 25 MG tablet      I am having Ms. Speights start on alendronate, losartan, and escitalopram. I am also having her maintain her MULTIPLE VITAMIN PO, calcium carbonate, valACYclovir, albuterol, diphenhydrAMINE, acetaminophen, benzocaine, lidocaine, predniSONE, pantoprazole, and simvastatin.  Meds ordered this encounter  Medications  . pantoprazole (PROTONIX) 40 MG tablet    Sig: Take 1 tablet (40 mg total) by mouth daily.    Dispense:  90 tablet    Refill:  1  . simvastatin (ZOCOR) 40 MG tablet    Sig: Take 1 tablet (40 mg total)  by mouth at bedtime.    Dispense:  90 tablet    Refill:  1  . alendronate (FOSAMAX) 70 MG tablet    Sig: Take 1 tablet (70 mg total) by mouth every 7 (seven) days. Take with a full glass of water on an empty stomach.    Dispense:  4 tablet    Refill:  11  . losartan (COZAAR) 25 MG tablet    Sig: Take 1 tablet (25 mg total) by mouth daily.    Dispense:  30 tablet    Refill:  5  . escitalopram (LEXAPRO) 10 MG tablet    Sig: Take 1 tablet (10 mg total) by mouth daily.    Dispense:  30 tablet    Refill:  5    Medications Discontinued During This Encounter  Medication Reason  . pantoprazole (PROTONIX) 40 MG tablet Reorder  . simvastatin (ZOCOR) 40 MG tablet Reorder    Follow-up: No Follow-up on file.   [name], MD

## 2016-06-22 NOTE — Assessment & Plan Note (Signed)
Aggravated by health issues and overbearing sister. STARTING Lexapro at 5 mg daily.

## 2016-06-22 NOTE — Assessment & Plan Note (Signed)
Therapy options dicussed.  Starting alendronate weekly  Lab Results  Component Value Date   CREATININE 0.55 06/10/2016

## 2016-06-22 NOTE — Assessment & Plan Note (Addendum)
Starting losartan 25 mg daily.   RTC in one week for RN visit and bmet.

## 2016-06-22 NOTE — Progress Notes (Signed)
Pre visit review using our clinic review tool, if applicable. No additional management support is needed unless otherwise documented below in the visit note. 

## 2016-06-27 ENCOUNTER — Telehealth: Payer: Self-pay | Admitting: Radiology

## 2016-06-27 DIAGNOSIS — E7800 Pure hypercholesterolemia, unspecified: Secondary | ICD-10-CM

## 2016-06-27 DIAGNOSIS — E559 Vitamin D deficiency, unspecified: Secondary | ICD-10-CM

## 2016-06-27 DIAGNOSIS — R7303 Prediabetes: Secondary | ICD-10-CM

## 2016-06-27 NOTE — Telephone Encounter (Signed)
FASTING LABS ORDERED.  

## 2016-06-27 NOTE — Addendum Note (Signed)
Addended by: [name] on: 06/27/2016 01:39 PM   Modules accepted: Orders

## 2016-06-27 NOTE — Telephone Encounter (Signed)
Pt coming in for labs on Wednesday, please place future orders. Thank you.  

## 2016-06-28 ENCOUNTER — Telehealth: Payer: Self-pay | Admitting: Internal Medicine

## 2016-06-28 DIAGNOSIS — R0902 Hypoxemia: Secondary | ICD-10-CM

## 2016-06-28 NOTE — Telephone Encounter (Signed)
Placed DME order in quick sign this morning along with order sheet from [facility] just need signature on both.

## 2016-06-28 NOTE — Telephone Encounter (Signed)
Pt daughter Meredith Mody called and stated that they have not heard anything from [facility] in regards to oxygen. Daughter would like to know if we could have [facility] call her to set it up. Please advise, thank you!  Call Gwen @ [phone]

## 2016-06-29 ENCOUNTER — Other Ambulatory Visit: Payer: Self-pay

## 2016-06-29 NOTE — Telephone Encounter (Signed)
Signed and faxed

## 2016-06-29 NOTE — Telephone Encounter (Signed)
FYI, Daughter called to follow up on the DME order. I did tell her that it was faxed to [facility].

## 2016-07-01 ENCOUNTER — Ambulatory Visit (INDEPENDENT_AMBULATORY_CARE_PROVIDER_SITE_OTHER): Payer: Medicare Other | Admitting: *Deleted

## 2016-07-01 ENCOUNTER — Other Ambulatory Visit (INDEPENDENT_AMBULATORY_CARE_PROVIDER_SITE_OTHER): Payer: Medicare Other

## 2016-07-01 VITALS — BP 140/78 | HR 75 | Resp 14

## 2016-07-01 DIAGNOSIS — R7303 Prediabetes: Secondary | ICD-10-CM | POA: Diagnosis not present

## 2016-07-01 DIAGNOSIS — E559 Vitamin D deficiency, unspecified: Secondary | ICD-10-CM

## 2016-07-01 DIAGNOSIS — I10 Essential (primary) hypertension: Secondary | ICD-10-CM | POA: Diagnosis not present

## 2016-07-01 DIAGNOSIS — E7800 Pure hypercholesterolemia, unspecified: Secondary | ICD-10-CM | POA: Diagnosis not present

## 2016-07-01 LAB — LIPID PANEL
CHOLESTEROL: 186 mg/dL (ref 0–200)
HDL: 81.9 mg/dL (ref >39.00)
LDL CALC: 84 mg/dL (ref 0–99)
NonHDL: 103.94
TRIGLYCERIDES: 99 mg/dL (ref 0.0–149.0)
Total CHOL/HDL Ratio: 2
VLDL: 19.8 mg/dL (ref 0.0–40.0)

## 2016-07-01 LAB — COMPREHENSIVE METABOLIC PANEL
ALT: 25 U/L (ref 0–35)
AST: 21 U/L (ref 0–37)
Albumin: 4.2 g/dL (ref 3.5–5.2)
Alkaline Phosphatase: 87 U/L (ref 39–117)
BUN: 8 mg/dL (ref 6–23)
CALCIUM: 9.4 mg/dL (ref 8.4–10.5)
CO2: 26 mEq/L (ref 19–32)
Chloride: 102 mEq/L (ref 96–112)
Creatinine, Ser: 0.57 mg/dL (ref 0.40–1.20)
GFR: 107.98 mL/min (ref >60.00)
GLUCOSE: 126 mg/dL — AB (ref 70–99)
POTASSIUM: 4.3 meq/L (ref 3.5–5.1)
Sodium: 137 mEq/L (ref 135–145)
Total Bilirubin: 0.7 mg/dL (ref 0.2–1.2)
Total Protein: 6.8 g/dL (ref 6.0–8.3)

## 2016-07-01 LAB — HEMOGLOBIN A1C: Hgb A1c MFr Bld: 6.4 % (ref 4.6–6.5)

## 2016-07-01 LAB — LDL CHOLESTEROL, DIRECT: LDL DIRECT: 89 mg/dL

## 2016-07-01 LAB — VITAMIN D 25 HYDROXY (VIT D DEFICIENCY, FRACTURES): VITD: 36.25 ng/mL (ref 30.00–100.00)

## 2016-07-01 NOTE — Progress Notes (Signed)
Patient presented for BP check One week post starting Losartan 25 mg. Patient BP attained in left arm 140/76 Pulse 73. BP attained with patient resting 8-10 minutes. Second BP was attained with additional wait period of 8-10 minutes in right arm BP 140/78 pulse 75.

## 2016-07-03 ENCOUNTER — Encounter: Payer: Self-pay | Admitting: Internal Medicine

## 2016-07-03 DIAGNOSIS — E119 Type 2 diabetes mellitus without complications: Secondary | ICD-10-CM | POA: Insufficient documentation

## 2016-07-04 ENCOUNTER — Encounter: Payer: Self-pay | Admitting: Internal Medicine

## 2016-07-05 ENCOUNTER — Encounter: Payer: Self-pay | Admitting: *Deleted

## 2016-07-05 ENCOUNTER — Other Ambulatory Visit: Payer: Self-pay | Admitting: Internal Medicine

## 2016-07-05 ENCOUNTER — Telehealth: Payer: Self-pay | Admitting: *Deleted

## 2016-07-05 DIAGNOSIS — J441 Chronic obstructive pulmonary disease with (acute) exacerbation: Secondary | ICD-10-CM

## 2016-07-05 DIAGNOSIS — R0902 Hypoxemia: Principal | ICD-10-CM

## 2016-07-05 DIAGNOSIS — J449 Chronic obstructive pulmonary disease, unspecified: Secondary | ICD-10-CM

## 2016-07-05 NOTE — Progress Notes (Signed)
Patient notified and voiced understanding with read back of medication change and BP check scheduled for one week.

## 2016-07-05 NOTE — Telephone Encounter (Signed)
Yes! Signed.

## 2016-07-05 NOTE — Telephone Encounter (Signed)
Pt daughter called about not getting any where with [facility]. She wanted to know if there's another DME that they can use? Please advise?  Call pt @ [phone]. Thank you!

## 2016-07-05 NOTE — Telephone Encounter (Signed)
[facility] needed new order for 02 can you sign off on order?

## 2016-07-05 NOTE — Progress Notes (Signed)
Please ask patient to increase dose of losartan  to 50 mg daily and repeat a BP reading via RN visit in one week

## 2016-07-05 NOTE — Telephone Encounter (Signed)
I have recent patient 02 order to [facility] and notified patient Mary Howe does not except her medicare. FYI

## 2016-07-07 NOTE — Telephone Encounter (Signed)
See email in pt's chart from 07/05/2016

## 2016-07-12 ENCOUNTER — Ambulatory Visit: Payer: Self-pay | Admitting: Pulmonary Disease

## 2016-07-13 ENCOUNTER — Encounter: Payer: Self-pay | Admitting: *Deleted

## 2016-07-13 ENCOUNTER — Ambulatory Visit (INDEPENDENT_AMBULATORY_CARE_PROVIDER_SITE_OTHER): Payer: Medicare Other | Admitting: *Deleted

## 2016-07-13 VITALS — BP 120/60 | HR 97 | Resp 12

## 2016-07-13 DIAGNOSIS — I10 Essential (primary) hypertension: Secondary | ICD-10-CM

## 2016-07-13 NOTE — Progress Notes (Signed)
Patient presented for 1 week BP check after increase losartan from 25 mg daily to 50 mg daily. BP attained according to nursing standards, Left arm 128/68 pulse 95 after resting an additional 10 minutes BP right arm 120/60 pulse 97.  Patient has received her 02 at home but not wearing as to PCP recommendations ( While ambulating) . Tried to re- enforce teaching as to why patient needs to wear while ambulating, and advised patient I would help to get smaller tank or portable concentrator. Patient stated she would discuss with daughter and let office know. While ambulating to check out stats 88- 87.

## 2016-07-14 NOTE — Progress Notes (Signed)
Patient notified and voiced understanding.

## 2016-07-14 NOTE — Progress Notes (Signed)
  I have reviewed the above information and agree with above. BP is at goal.  Needs to wear oxygen while active.   Deborra Medina, MD

## 2016-07-25 ENCOUNTER — Encounter: Payer: Self-pay | Admitting: Internal Medicine

## 2016-07-25 ENCOUNTER — Ambulatory Visit (INDEPENDENT_AMBULATORY_CARE_PROVIDER_SITE_OTHER): Payer: Medicare Other | Admitting: Internal Medicine

## 2016-07-25 DIAGNOSIS — M8008XD Age-related osteoporosis with current pathological fracture, vertebra(e), subsequent encounter for fracture with routine healing: Secondary | ICD-10-CM

## 2016-07-25 DIAGNOSIS — E119 Type 2 diabetes mellitus without complications: Secondary | ICD-10-CM | POA: Diagnosis not present

## 2016-07-25 DIAGNOSIS — K2100 Gastro-esophageal reflux disease with esophagitis, without bleeding: Secondary | ICD-10-CM

## 2016-07-25 DIAGNOSIS — J431 Panlobular emphysema: Secondary | ICD-10-CM

## 2016-07-25 DIAGNOSIS — E7800 Pure hypercholesterolemia, unspecified: Secondary | ICD-10-CM

## 2016-07-25 MED ORDER — LOSARTAN POTASSIUM 50 MG PO TABS: 100.0000 mg | ORAL_TABLET | Freq: Every day | ORAL | 2 refills | Status: DC

## 2016-07-25 MED ORDER — ATORVASTATIN CALCIUM 40 MG PO TABS: 40.0000 mg | ORAL_TABLET | Freq: Every day | ORAL | 3 refills | Status: DC

## 2016-07-25 NOTE — Patient Instructions (Addendum)
Take the protonix when you first get out of bed with your water  It will  be absorbed better   I recommend you take a 2nd tylenol daily for your back pain so you don't start to "hunch over'   I recommend that you check your oxygen level when you are walking and steart wearing your oxygen while you are walking if your level is < 88%  Using a portable tank  We will repeat your diabetes labs in July   I am increasing your losartan dose to 100  Mg daily,  New rx for 50 mg tablets sent to pharmacy   changinf from simvastatin to lipitor (atorvastatin) for cholesterol management  Your tremor is a benign essential tremor.  Not caused by medications  You can have one glass of wine per night !   Consider the Low Glycemic Index Diet and 6 smaller meals daily .  This boosts your metabolism and regulates your sugars:   Use the protein bar by [employer] because they have lots of fiber in them  Find the low carb flatbreads, tortillas and pita breads for sandwiches:  [employer] makes a pita bread and a flat bread , available at [facility] and [employer]; [place] makes a low carb flatbread available at [employer] and HT that is 9 net carbs and 100 cal [place] makes a low carb whole wheat tortilla available at [employer] most grocery stores with 6 net carbs and 210 cal  [place] yogurt can still have a lot of carbs .  Dannon Light N fit has 80 cal and 8 carbs

## 2016-07-25 NOTE — Progress Notes (Signed)
Pre visit review using our clinic review tool, if applicable. No additional management support is needed unless otherwise documented below in the visit note. 

## 2016-07-25 NOTE — Progress Notes (Signed)
Subjective:  Patient ID: Mary Howe, female    DOB: December 05, 1932  Age: 83 y.o. MRN: [id number]  CC: Diagnoses of Hypercholesterolemia, Age-related osteoporosis with current pathological fracture of vertebra with routine healing, subsequent encounter, Panlobular emphysema ([place]), Gastroesophageal reflux disease with esophagitis, and Diabetes mellitus without complication ([place]) were pertinent to this visit.  HPI Mary Howe presents for follow up on hypertension, osteoporosis with recent vertebral fracture,  and COPD with chronic hypoxic respiratory failure.    HTN:  She has increased her  losartan dose to 50 mg daily for the past 3 weeks.  Taking it  In the morning with protonix and lexapro  . Tolerating he medication change without hypotension and lethargy,   Recent History of vertebral fracture.   Not in a lot of pain currently.  Taking one tylenol daily .  Going up the stairs several times daily.  Some llight gardening ,  Walking 2 blocks twice daily . Has a Remote history of alendronate and boniva use..  Now taking alendronate weekly. Tolerating medication     Type 2 DM, new diagnosis addressed .  Diet discussed   COPD:  She is not wearing her oxygen unless she is short of breath or walking.  Using it at night averaging 8-9 hours per night.   Has not seen a level below 90%  but has not taken her pulse ox meter. With her on her short walks.    Does not have a portable tank  to take with her on her walks.     .   Outpatient Medications Prior to Visit  Medication Sig Dispense Refill  . acetaminophen (TYLENOL) 500 MG tablet Take 500 mg by mouth every 6 (six) hours as needed.    Marland Kitchen albuterol (PROVENTIL HFA;VENTOLIN HFA) 108 (90 Base) MCG/ACT inhaler Inhale 1-2 puffs into the lungs every 4 (four) hours as needed for wheezing or shortness of breath. 1 Inhaler 10  . alendronate (FOSAMAX) 70 MG tablet Take 1 tablet (70 mg total) by mouth every 7 (seven) days. Take with a full glass of water on  an empty stomach. 4 tablet 11  . benzocaine (ORAJEL) 10 % mucosal gel Use as directed in the mouth or throat 3 (three) times daily as needed for mouth pain. 5.3 g 0  . calcium carbonate (OS-CAL) 600 MG TABS tablet Take 600 mg by mouth daily with breakfast.     . diphenhydrAMINE (BENADRYL) 25 MG tablet Take 25 mg by mouth at bedtime as needed.    Marland Kitchen escitalopram (LEXAPRO) 10 MG tablet Take 1 tablet (10 mg total) by mouth daily. 30 tablet 5  . lidocaine (LIDODERM) 5 % Place 1 patch onto the skin daily. Remove & Discard patch within 12 hours or as directed by MD 30 patch 0  . MULTIPLE VITAMIN PO Take 1 tablet by mouth daily.     . pantoprazole (PROTONIX) 40 MG tablet Take 1 tablet (40 mg total) by mouth daily. 90 tablet 1  . predniSONE (STERAPRED UNI-PAK 21 TAB) 10 MG (21) TBPK tablet Taper as directed. 21 tablet 0  . valACYclovir (VALTREX) 1000 MG tablet Take 1,000 mg by mouth 2 (two) times daily.     Marland Kitchen losartan (COZAAR) 25 MG tablet Take 1 tablet (25 mg total) by mouth daily. (Patient taking differently: Take 25 mg by mouth daily. ) 30 tablet 5  . simvastatin (ZOCOR) 40 MG tablet Take 1 tablet (40 mg total) by mouth at bedtime. 90 tablet 1   No  facility-administered medications prior to visit.     Review of Systems;  Patient denies headache, fevers, malaise, unintentional weight loss, skin rash, eye pain, sinus congestion and sinus pain, sore throat, dysphagia,  hemoptysis , cough, dyspnea, wheezing, chest pain, palpitations, orthopnea, edema, abdominal pain, nausea, melena, diarrhea, constipation, flank pain, dysuria, hematuria, urinary  Frequency, nocturia, numbness, tingling, seizures,  Focal weakness, Loss of consciousness,  Tremor, insomnia, depression, anxiety, and suicidal ideation.      Objective:  BP (!) 142/76   Pulse 82   Temp 97.8 F (36.6 C) (Oral)   Resp 16   Ht 5\' 4"  (1.626 m)   Wt 133 lb 12.8 oz (60.7 kg)   SpO2 96%   BMI 22.97 kg/m   BP Readings from Last 3  Encounters:  07/25/16 (!) 142/76  07/13/16 120/60  07/01/16 140/78    Wt Readings from Last 3 Encounters:  07/25/16 133 lb 12.8 oz (60.7 kg)  06/22/16 133 lb 6.4 oz (60.5 kg)  06/10/16 134 lb 14.4 oz (61.2 kg)    General appearance: alert, cooperative and appears stated age Ears: normal TM's and external ear canals both ears Throat: lips, mucosa, and tongue normal; teeth and gums normal Neck: no adenopathy, no carotid bruit, supple, symmetrical, trachea midline and thyroid not enlarged, symmetric, no tenderness/mass/nodules Back: symmetric, no curvature. ROM normal. No CVA tenderness. Lungs: clear to auscultation bilaterally Heart: regular rate and rhythm, S1, S2 normal, no murmur, click, rub or gallop Abdomen: soft, non-tender; bowel sounds normal; no masses,  no organomegaly Pulses: 2+ and symmetric Skin: Skin color, texture, turgor normal. No rashes or lesions Lymph nodes: Cervical, supraclavicular, and axillary nodes normal.  Lab Results  Component Value Date   HGBA1C 6.4 07/01/2016   HGBA1C 5.9 (H) 01/12/2016   HGBA1C 5.9 10/21/2013    Lab Results  Component Value Date   CREATININE 0.57 07/01/2016   CREATININE 0.55 06/10/2016   CREATININE 0.55 01/28/2016    Lab Results  Component Value Date   WBC 13.9 (H) 06/10/2016   HGB 14.4 06/10/2016   HCT 41.7 06/10/2016   PLT 230 06/10/2016   GLUCOSE 126 (H) 07/01/2016   CHOL 186 07/01/2016   TRIG 99.0 07/01/2016   HDL 81.90 07/01/2016   LDLDIRECT 89.0 07/01/2016   LDLCALC 84 07/01/2016   ALT 25 07/01/2016   AST 21 07/01/2016   NA 137 07/01/2016   K 4.3 07/01/2016   CL 102 07/01/2016   CREATININE 0.57 07/01/2016   BUN 8 07/01/2016   CO2 26 07/01/2016   TSH 4.38 01/28/2016   HGBA1C 6.4 07/01/2016   MICROALBUR <0.7 01/28/2016    Dg Chest 2 View  Result Date: 06/10/2016 CLINICAL DATA:  Shortness of breath and chest pain EXAM: CHEST  2 VIEW COMPARISON:  CT from earlier in the same day, 01/13/2016 FINDINGS:  Cardiac shadow is mildly enlarged but stable. The lungs are well aerated bilaterally. Chronic fibrotic changes are noted similar to that seen on recent CT. T5 and T7 compression deformities are again noted similar to that seen on recent CTs. IMPRESSION: Chronic fibrotic changes and hyperinflation. T5 and T7 compression deformity similar to that seen on recent CT. Electronically Signed   By: Inez Catalina M.D.   On: 06/10/2016 11:15   Ct Head Wo Contrast  Result Date: 06/10/2016 CLINICAL DATA:  Headache and shortness of breath. EXAM: CT HEAD WITHOUT CONTRAST TECHNIQUE: Contiguous axial images were obtained from the base of the skull through the vertex without intravenous contrast. COMPARISON:  01/12/2016 FINDINGS: Brain: Stable mild age related cerebral atrophy, ventriculomegaly and periventricular white matter disease. No extra-axial fluid collections are identified. No CT findings for acute hemispheric infarction or intracranial hemorrhage. No mass lesions. The brainstem and cerebellum are normal. Vascular: No hyperdense vessels or obvious aneurysm. Stable minimal vascular calcifications. Skull: No skull fracture or bone lesion. Sinuses/Orbits: The paranasal sinuses and mastoid air cells are clear. The globes are intact. Other: No scalp lesions or hematoma. IMPRESSION: Stable mild age related cerebral atrophy, ventriculomegaly and periventricular white matter disease. No acute intracranial findings. Electronically Signed   By: Marijo Sanes M.D.   On: 06/10/2016 11:10   Ct Thoracic Spine Wo Contrast  Result Date: 06/10/2016 CLINICAL DATA:  History of compression fractures. Back pain since Monday. No known trauma. EXAM: CT THORACIC SPINE WITHOUT CONTRAST TECHNIQUE: Multidetector CT images of the thoracic were obtained using the standard protocol without intravenous contrast. COMPARISON:  None. FINDINGS: Alignment: Normal. Vertebrae: Chronic T3 and T5 vertebral body compression fractures without significant  interval height loss compared with 01/13/2016. Interval development of a new T7 vertebral body compression fracture with approximately 15% height loss and without significant retropulsion of the posterior margin of the T7 vertebral body. No other vertebral body compression fracture. No aggressive osseous lesion. Paraspinal and other soft tissues: No paraspinal abnormality. Thoracic aortic atherosclerosis. Bibasilar atelectasis. Severe bilateral centrilobular and paraseptal emphysema. Disc levels: Disc spaces are relatively well maintained. No foraminal or central canal stenosis. IMPRESSION: 1. Acute versus subacute T7 vertebral body compression fracture with approximately 50% height loss. 2. Chronic T3 and T5 vertebral body compression fractures. 3.  Emphysema. (ICD10-J43.9) 4.  Aortic Atherosclerosis (ICD10-170.0) Electronically Signed   By: Kathreen Devoid   On: 06/10/2016 11:11    Assessment & Plan:   Problem List Items Addressed This Visit    Acid reflux    Currently managed with PPI,  Reviewed proper administration       Age-related osteoporosis with current pathol fracture of vertebra ([place])    Post menopausal aggravated by years of tobacco abuse.  Apparently the diagnosis is old,  As she has vidence of prior vertebral fractures and has  taken alendronate and boniva in the past.  She is tolerating weekly aledronate weekly  Lab Results  Component Value Date   CREATININE 0.57 07/01/2016         COPD (chronic obstructive pulmonary disease) ([place])    She is asymptomatic at rest and cannot afford the maintenance inhalers.  She has home oxygen for documented hypoxia with drop in saturations on room air to < 88% with walking .  She is not using it with walks because she does ntt have a portable tank .       Diabetes mellitus without complication ([place])    Diagnosed with fasting glucose of 125 and a1c 6.4 no medications needed other than statin and aspirin at this time.  Low gi diet discussed in  detail       Relevant Medications   atorvastatin (LIPITOR) 40 MG tablet   losartan (COZAAR) 50 MG tablet   Hypercholesterolemia    Changing to higher potency statin given her concurrent diagnoses and elevated risk.  Lab Results  Component Value Date   CHOL 186 07/01/2016   HDL 81.90 07/01/2016   LDLCALC 84 07/01/2016   LDLDIRECT 89.0 07/01/2016   TRIG 99.0 07/01/2016   CHOLHDL 2 07/01/2016         Relevant Medications   atorvastatin (LIPITOR) 40 MG tablet  losartan (COZAAR) 50 MG tablet      I have discontinued Ms. Grimm's simvastatin. I have also changed her losartan. Additionally, I am having her start on atorvastatin. Lastly, I am having her maintain her MULTIPLE VITAMIN PO, calcium carbonate, valACYclovir, albuterol, diphenhydrAMINE, acetaminophen, benzocaine, lidocaine, predniSONE, pantoprazole, alendronate, and escitalopram.  Meds ordered this encounter  Medications  . atorvastatin (LIPITOR) 40 MG tablet    Sig: Take 1 tablet (40 mg total) by mouth daily.    Dispense:  30 tablet    Refill:  3  . losartan (COZAAR) 50 MG tablet    Sig: Take 2 tablets (100 mg total) by mouth daily.    Dispense:  60 tablet    Refill:  2    Medications Discontinued During This Encounter  Medication Reason  . simvastatin (ZOCOR) 40 MG tablet   . losartan (COZAAR) 25 MG tablet Reorder    Follow-up: Return in about 3 months (around 10/24/2016) for follow up diabetes, fasting labs prior .   [name], MD

## 2016-07-27 NOTE — Assessment & Plan Note (Signed)
She is asymptomatic at rest and cannot afford the maintenance inhalers.  She has home oxygen for documented hypoxia with drop in saturations on room air to < 88% with walking .  She is not using it with walks because she does ntt have a portable tank .

## 2016-07-27 NOTE — Assessment & Plan Note (Signed)
Currently managed with PPI,  Reviewed proper administration

## 2016-07-27 NOTE — Assessment & Plan Note (Signed)
Post menopausal aggravated by years of tobacco abuse.  Apparently the diagnosis is old,  As she has vidence of prior vertebral fractures and has  taken alendronate and boniva in the past.  She is tolerating weekly aledronate weekly  Lab Results  Component Value Date   CREATININE 0.57 07/01/2016

## 2016-07-27 NOTE — Assessment & Plan Note (Addendum)
Diagnosed with fasting glucose of 125 and a1c 6.4 no medications needed other than statin and aspirin at this time.  Low gi diet discussed in detail

## 2016-07-27 NOTE — Assessment & Plan Note (Signed)
Changing to higher potency statin given her concurrent diagnoses and elevated risk.  Lab Results  Component Value Date   CHOL 186 07/01/2016   HDL 81.90 07/01/2016   LDLCALC 84 07/01/2016   LDLDIRECT 89.0 07/01/2016   TRIG 99.0 07/01/2016   CHOLHDL 2 07/01/2016

## 2016-08-09 ENCOUNTER — Telehealth: Payer: Self-pay | Admitting: Internal Medicine

## 2016-08-09 ENCOUNTER — Encounter: Payer: Self-pay | Admitting: Internal Medicine

## 2016-08-09 NOTE — Telephone Encounter (Signed)
Patient is still needing the small portable oxygen tank per daughter .  Did we already try and it was declined by insurance?

## 2016-08-10 NOTE — Telephone Encounter (Signed)
Patient stated she needs small over shoulder tank and carrying case. That [facility] care did not leave this with initial order.

## 2016-08-12 NOTE — Telephone Encounter (Signed)
Notified [facility] patient needs smaller tanks for walking and travel talked with Corene Cornea, and they are to contact patient to set up delivery of smaller tanks . Left message with Patient daughter to return call to  Office.

## 2016-08-12 NOTE — Telephone Encounter (Signed)
Patient daughter notified of requested up date on 02 tanks.

## 2016-09-20 ENCOUNTER — Ambulatory Visit (INDEPENDENT_AMBULATORY_CARE_PROVIDER_SITE_OTHER): Payer: Medicare Other

## 2016-09-20 VITALS — BP 124/72 | HR 84 | Temp 98.2°F | Resp 12 | Ht 63.5 in | Wt 129.1 lb

## 2016-09-20 DIAGNOSIS — Z0000 Encounter for general adult medical examination without abnormal findings: Secondary | ICD-10-CM

## 2016-09-20 NOTE — Progress Notes (Signed)
Subjective:   Mary Howe is a 83 y.o. female who presents for an Initial Medicare Annual Wellness Visit.  Review of Systems    No ROS.  Medicare Wellness Visit. Additional risk factors are reflected in the social history.  Cardiac Risk Factors include: advanced age (>78men, >42 women);diabetes mellitus     Objective:    Today's Vitals   09/20/16 1108  BP: 124/72  Pulse: 84  Resp: 12  Temp: 98.2 F (36.8 C)  SpO2: 97%  Weight: 129 lb 1.9 oz (58.6 kg)  Height: 5' 3.5" (1.613 m)   Body mass index is 22.51 kg/m.   Current Medications (verified) Outpatient Encounter Prescriptions as of 09/20/2016  Medication Sig  . acetaminophen (TYLENOL) 500 MG tablet Take 500 mg by mouth every 6 (six) hours as needed.  Marland Kitchen albuterol (PROVENTIL HFA;VENTOLIN HFA) 108 (90 Base) MCG/ACT inhaler Inhale 1-2 puffs into the lungs every 4 (four) hours as needed for wheezing or shortness of breath.  Marland Kitchen alendronate (FOSAMAX) 70 MG tablet Take 1 tablet (70 mg total) by mouth every 7 (seven) days. Take with a full glass of water on an empty stomach.  Marland Kitchen atorvastatin (LIPITOR) 40 MG tablet Take 1 tablet (40 mg total) by mouth daily.  . benzocaine (ORAJEL) 10 % mucosal gel Use as directed in the mouth or throat 3 (three) times daily as needed for mouth pain.  . calcium carbonate (OS-CAL) 600 MG TABS tablet Take 600 mg by mouth daily with breakfast.   . cetirizine (ZYRTEC) 10 MG tablet Take 10 mg by mouth daily.  . diphenhydrAMINE (BENADRYL) 25 MG tablet Take 25 mg by mouth at bedtime as needed.  Marland Kitchen escitalopram (LEXAPRO) 10 MG tablet Take 1 tablet (10 mg total) by mouth daily.  Marland Kitchen lidocaine (LIDODERM) 5 % Place 1 patch onto the skin daily. Remove & Discard patch within 12 hours or as directed by MD  . losartan (COZAAR) 50 MG tablet Take 2 tablets (100 mg total) by mouth daily.  . MULTIPLE VITAMIN PO Take 1 tablet by mouth daily.   . pantoprazole (PROTONIX) 40 MG tablet Take 1 tablet (40 mg total) by mouth  daily.  . valACYclovir (VALTREX) 1000 MG tablet Take 1,000 mg by mouth 2 (two) times daily.   . [DISCONTINUED] predniSONE (STERAPRED UNI-PAK 21 TAB) 10 MG (21) TBPK tablet Taper as directed.   No facility-administered encounter medications on file as of 09/20/2016.     Allergies (verified) Patient has no known allergies.   History: Past Medical History:  Diagnosis Date  . COPD (chronic obstructive pulmonary disease) ([place])   . Diverticulitis 2015  . GERD (gastroesophageal reflux disease)   . Hyperlipidemia   . Thyroid disease    Past Surgical History:  Procedure Laterality Date  . CERVICAL CONE BIOPSY    . CHOLECYSTECTOMY    . HEMORROIDECTOMY     Family History  Problem Relation Age of Onset  . Prostate cancer Father   . Pneumonia Brother   . Lung disease Brother   . CAD Brother   . Heart disease Brother   . CAD Brother   . Heart attack Brother   . Brain cancer Brother   . Bone cancer Sister   . Emphysema Sister   . Fibrocystic breast disease Sister   . Aneurysm Sister   . Heart disease Sister    Social History   Occupational History  . retired    Social History Main Topics  . Smoking status: Former Research scientist (life sciences)  .  Smokeless tobacco: Never Used     Comment: quit smoking in 1990  . Alcohol use 0.0 oz/week     Comment: OCCASIONALLY DRINKS WINE  . Drug use: No  . Sexual activity: No    Tobacco Counseling Counseling given: Not Answered   Activities of Daily Living In your present state of health, do you have any difficulty performing the following activities: 09/20/2016 06/10/2016  Hearing? Tempie Donning  Vision? N N  Difficulty concentrating or making decisions? N N  Walking or climbing stairs? N N  Dressing or bathing? N N  Doing errands, shopping? N N  Preparing Food and eating ? N -  Using the Toilet? N -  In the past six months, have you accidently leaked urine? N -  Do you have problems with loss of bowel control? N -  Managing your Medications? N -  Managing  your Finances? N -  Housekeeping or managing your Housekeeping? N -  Some recent data might be hidden    Immunizations and Health Maintenance Immunization History  Administered Date(s) Administered  . Influenza, High Dose Seasonal PF 02/23/2015, 01/18/2016  . Pneumococcal Conjugate-13 10/21/2013  . Pneumococcal Polysaccharide-23 05/04/2016  . Zoster 01/30/2012   Health Maintenance Due  Topic Date Due  . FOOT EXAM  09/08/1944  . TETANUS/TDAP  09/08/1953    Patient Care Team: [name], MD as PCP - General (Internal Medicine)  Indicate any recent Medical Services you may have received from other than [facility] providers in the past year (date may be approximate).     Assessment:   This is a routine wellness examination for Mary Howe. The goal of the wellness visit is to assist the patient how to close the gaps in care and create a preventative care plan for the patient.   The roster of all physicians providing medical care to patient is listed in the Snapshot section of the chart.  Taking calcium VIT D and Fosamax as appropriate/Osteoporosis reviewed.    Safety issues reviewed; lives with sister.  Smoke and carbon monoxide detectors in the home. No firearms in the home.  Wears seatbelts when driving or riding with others. Patient does wear sunscreen or protective clothing when in direct sunlight. No violence in the home.  Depression- PHQ 2 &9 complete.  No signs/symptoms or verbal communication regarding little pleasure in doing things, feeling down, depressed or hopeless. No changes in sleeping, energy, eating, concentrating.  No thoughts of self harm or harm towards others.    Patient is alert, normal appearance, oriented to person/place/and time. Correctly identified the president of the [place], recall of 3/3 words, and performing simple calculations. Displays appropriate judgement and can read correct time from watch face.   No new identified risk were noted.  No failures at  ADL's or IADL's.    BMI- discussed the importance of a healthy diet, water intake and the benefits of aerobic exercise. Educational material provided.   24 hour diet recall: Breakfast: Yogurt, cranberries Lunch: Chef salad  Dinner:Peanut butter sandwich  Low carb foods encouraged  Daily fluid intake: 2 cups of caffeine,  5 cups of water   Dental- every 6 months.  Sleep patterns- Sleeps 7-9 hours at night.  Wakes feeling rested.  Oxygen not in use as of yet.  Received portable tank.  Home record placed in provider box for viewing. She plans to start wearing it when ambulating if walking level is <88%.  HTN- followed by PCP.  Home record placed in provider  box for viewing.  TDAP vaccine deferred per patient preference.  Follow up with insurance.  Educational material provided.  Patient Concerns: Requests information regarding benign essential tremor and diabetes previously discussed with PCP.  Emmi education assigned.    Hearing/Vision screen Hearing Screening Comments: Followed by [facility] ([place]) Visits as needed Hearing aid, bilateral Vision Screening Comments: Followed by [facility] Wears corrective lenses Last OV 2017 Visual acuity not assessed per patient preference since they have regular follow up with the ophthalmologist  Dietary issues and exercise activities discussed: Current Exercise Habits: The patient does not participate in regular exercise at present  Goals    . Increase lean proteins          Low carb foods    . Increase physical activity          Stay active and walk for exercise.  Monitor oxygen level.  Use oxygen as directed.        Depression Screen PHQ 2/9 Scores 09/20/2016 06/22/2016 02/23/2015  PHQ - 2 Score 0 0 0    Fall Risk Fall Risk  09/20/2016 06/22/2016 06/10/2016 02/23/2015  Falls in the past year? No Yes No No  Number falls in past yr: - 1 - -  Injury with Fall? - Yes - -  Risk Factor Category  - High Fall Risk - -     Cognitive Function: MMSE - Mini Mental State Exam 09/20/2016  Orientation to time 5  Orientation to Place 5  Registration 3  Attention/ Calculation 5  Recall 3  Language- name 2 objects 2  Language- repeat 1  Language- follow 3 step command 3  Language- read & follow direction 1  Write a sentence 1  Copy design 1  Total score 30        Screening Tests Health Maintenance  Topic Date Due  . FOOT EXAM  09/08/1944  . TETANUS/TDAP  09/08/1953  . INFLUENZA VACCINE  11/09/2016  . OPHTHALMOLOGY EXAM  11/09/2016  . HEMOGLOBIN A1C  01/01/2017  . DEXA SCAN  Completed  . PNA vac Low Risk Adult  Completed      Plan:    End of life planning; Advance aging; Advanced directives discussed. Copy of current HCPOA/Living Will requested.    I have personally reviewed and noted the following in the patient's chart:   . Medical and social history . Use of alcohol, tobacco or illicit drugs  . Current medications and supplements . Functional ability and status . Nutritional status . Physical activity . Advanced directives . List of other physicians . Hospitalizations, surgeries, and ER visits in previous 12 months . Vitals . Screenings to include cognitive, depression, and falls . Referrals and appointments  In addition, I have reviewed and discussed with patient certain preventive protocols, quality metrics, and best practice recommendations. A written personalized care plan for preventive services as well as general preventive health recommendations were provided to patient.     Varney Biles, LPN   05/15/5595

## 2016-09-20 NOTE — Patient Instructions (Addendum)
  Mary Howe , Thank you for taking time to come for your Medicare Wellness Visit. I appreciate your ongoing commitment to your health goals. Please review the following plan we discussed and let me know if I can assist you in the future.   Follow up with Dr. Derrel Nip as needed.    Bring a copy of your [place] and/or Living Will to be scanned into chart.  Emmi education assigned via portal: Benign essential tremor, Diabetes  Have a great day!  These are the goals we discussed: Goals    . Increase lean proteins          Low carb foods    . Increase physical activity          Stay active and walk for exercise.  Monitor oxygen level.  Use oxygen as directed.         This is a list of the screening recommended for you and due dates:  Health Maintenance  Topic Date Due  . Complete foot exam   09/08/1944  . Tetanus Vaccine  09/08/1953  . Flu Shot  11/09/2016  . Eye exam for diabetics  11/09/2016  . Hemoglobin A1C  01/01/2017  . DEXA scan (bone density measurement)  Completed  . Pneumonia vaccines  Completed

## 2016-11-09 ENCOUNTER — Telehealth: Payer: Self-pay | Admitting: Radiology

## 2016-11-09 NOTE — Telephone Encounter (Signed)
Pt coming in for labs tomorrow, please place future orders. Thank you.  

## 2016-11-10 ENCOUNTER — Other Ambulatory Visit (INDEPENDENT_AMBULATORY_CARE_PROVIDER_SITE_OTHER): Payer: Medicare Other

## 2016-11-10 ENCOUNTER — Telehealth: Payer: Self-pay | Admitting: Radiology

## 2016-11-10 DIAGNOSIS — Z8639 Personal history of other endocrine, nutritional and metabolic disease: Secondary | ICD-10-CM

## 2016-11-10 DIAGNOSIS — I10 Essential (primary) hypertension: Secondary | ICD-10-CM

## 2016-11-10 DIAGNOSIS — E119 Type 2 diabetes mellitus without complications: Secondary | ICD-10-CM

## 2016-11-10 DIAGNOSIS — E559 Vitamin D deficiency, unspecified: Secondary | ICD-10-CM

## 2016-11-10 LAB — HEMOGLOBIN A1C: HEMOGLOBIN A1C: 6.1 % (ref 4.6–6.5)

## 2016-11-10 LAB — COMPREHENSIVE METABOLIC PANEL
ALK PHOS: 201 U/L — AB (ref 39–117)
ALT: 39 U/L — AB (ref 0–35)
AST: 35 U/L (ref 0–37)
Albumin: 4.3 g/dL (ref 3.5–5.2)
BILIRUBIN TOTAL: 1 mg/dL (ref 0.2–1.2)
BUN: 10 mg/dL (ref 6–23)
CO2: 27 meq/L (ref 19–32)
Calcium: 9.5 mg/dL (ref 8.4–10.5)
Chloride: 92 mEq/L — ABNORMAL LOW (ref 96–112)
Creatinine, Ser: 0.56 mg/dL (ref 0.40–1.20)
GFR: 110.11 mL/min (ref >60.00)
GLUCOSE: 101 mg/dL — AB (ref 70–99)
POTASSIUM: 4.6 meq/L (ref 3.5–5.1)
SODIUM: 127 meq/L — AB (ref 135–145)
TOTAL PROTEIN: 7 g/dL (ref 6.0–8.3)

## 2016-11-10 LAB — TSH: TSH: 4.83 u[IU]/mL — ABNORMAL HIGH (ref 0.35–4.50)

## 2016-11-10 LAB — LIPID PANEL
Cholesterol: 151 mg/dL (ref 0–200)
HDL: 93.8 mg/dL (ref >39.00)
LDL CALC: 46 mg/dL (ref 0–99)
NONHDL: 57.45
Total CHOL/HDL Ratio: 2
Triglycerides: 56 mg/dL (ref 0.0–149.0)
VLDL: 11.2 mg/dL (ref 0.0–40.0)

## 2016-11-10 LAB — VITAMIN D 25 HYDROXY (VIT D DEFICIENCY, FRACTURES): VITD: 44.71 ng/mL (ref 30.00–100.00)

## 2016-11-10 NOTE — Telephone Encounter (Signed)
2nd message. Pt coming in for labs today, please place future orders.Thank you.

## 2016-11-10 NOTE — Telephone Encounter (Signed)
Labs ordered.

## 2016-11-10 NOTE — Addendum Note (Signed)
Addended by: [name] on: 11/10/2016 08:24 AM   Modules accepted: Orders

## 2016-11-11 ENCOUNTER — Encounter: Payer: Self-pay | Admitting: Internal Medicine

## 2016-11-11 ENCOUNTER — Ambulatory Visit (INDEPENDENT_AMBULATORY_CARE_PROVIDER_SITE_OTHER): Payer: Medicare Other | Admitting: Internal Medicine

## 2016-11-11 VITALS — BP 112/58 | HR 103 | Temp 97.6°F | Resp 16 | Ht 63.5 in | Wt 128.0 lb

## 2016-11-11 DIAGNOSIS — R748 Abnormal levels of other serum enzymes: Secondary | ICD-10-CM

## 2016-11-11 DIAGNOSIS — M8008XD Age-related osteoporosis with current pathological fracture, vertebra(e), subsequent encounter for fracture with routine healing: Secondary | ICD-10-CM

## 2016-11-11 DIAGNOSIS — E871 Hypo-osmolality and hyponatremia: Secondary | ICD-10-CM

## 2016-11-11 DIAGNOSIS — E119 Type 2 diabetes mellitus without complications: Secondary | ICD-10-CM | POA: Diagnosis not present

## 2016-11-11 DIAGNOSIS — R1900 Intra-abdominal and pelvic swelling, mass and lump, unspecified site: Secondary | ICD-10-CM | POA: Diagnosis not present

## 2016-11-11 DIAGNOSIS — R634 Abnormal weight loss: Secondary | ICD-10-CM

## 2016-11-11 DIAGNOSIS — R1013 Epigastric pain: Secondary | ICD-10-CM

## 2016-11-11 LAB — LIPASE: Lipase: 54 U/L (ref 11.0–59.0)

## 2016-11-11 LAB — COMPREHENSIVE METABOLIC PANEL
ALBUMIN: 4.5 g/dL (ref 3.5–5.2)
ALT: 40 U/L — ABNORMAL HIGH (ref 0–35)
AST: 36 U/L (ref 0–37)
Alkaline Phosphatase: 205 U/L — ABNORMAL HIGH (ref 39–117)
BUN: 15 mg/dL (ref 6–23)
CO2: 28 mEq/L (ref 19–32)
CREATININE: 0.58 mg/dL (ref 0.40–1.20)
Calcium: 9.3 mg/dL (ref 8.4–10.5)
Chloride: 95 mEq/L — ABNORMAL LOW (ref 96–112)
GFR: 105.74 mL/min (ref >60.00)
Glucose, Bld: 123 mg/dL — ABNORMAL HIGH (ref 70–99)
POTASSIUM: 4.5 meq/L (ref 3.5–5.1)
SODIUM: 129 meq/L — AB (ref 135–145)
TOTAL PROTEIN: 7.1 g/dL (ref 6.0–8.3)
Total Bilirubin: 0.7 mg/dL (ref 0.2–1.2)

## 2016-11-11 MED ORDER — LOSARTAN POTASSIUM 50 MG PO TABS: 100.0000 mg | ORAL_TABLET | Freq: Every day | ORAL | 2 refills | Status: DC

## 2016-11-11 NOTE — Progress Notes (Signed)
Subjective:  Patient ID: Mary Howe, female    DOB: 06-Jun-1932  Age: 83 y.o. MRN: [id number]  CC: The primary encounter diagnosis was Epigastric pain. Diagnoses of Weight loss, Intra-abdominal and pelvic swelling, mass and lump, unspecified site, Hyponatremia, Elevated liver enzymes, Diabetes mellitus without complication (HCC), and Age-related osteoporosis with current pathological fracture of vertebra with routine healing, subsequent encounter were also pertinent to this visit.  HPI Mary Howe presents for FOLLOW UP ON TYPE 2 DM   OSTEOPOROSIS WITH PRIOR FRACTURES,  COPD, and hyperlipidemia    HAS BEEN MORE SLEEPY. TROUBLE GETTING UP  IN THE MORNING.  NOT WEARING OXYGEN AT NIGHT , JUST IF HER PULSE OX FALLS BELOW 88% Which has not happened in over a month   Home bps have been  < 130/80 and all over 104/60   RECENT ABNORMAL LABS REVIEWED : LIPIDS, VIT D NORMAL .  TSH  ELEVATED SLIGHTLY ALK PHOS AND ALT ELEVATED  SODIUM LOW   Some abdmominal  complaints . Mild epigastric pain not aggravated by eating,  But she hs lost 5 lbs since April.  No change in bowel habits,  No nausea.     Outpatient Medications Prior to Visit  Medication Sig Dispense Refill  . acetaminophen (TYLENOL) 500 MG tablet Take 500 mg by mouth every 6 (six) hours as needed.    Marland Kitchen albuterol (PROVENTIL HFA;VENTOLIN HFA) 108 (90 Base) MCG/ACT inhaler Inhale 1-2 puffs into the lungs every 4 (four) hours as needed for wheezing or shortness of breath. 1 Inhaler 10  . alendronate (FOSAMAX) 70 MG tablet Take 1 tablet (70 mg total) by mouth every 7 (seven) days. Take with a full glass of water on an empty stomach. 4 tablet 11  . benzocaine (ORAJEL) 10 % mucosal gel Use as directed in the mouth or throat 3 (three) times daily as needed for mouth pain. 5.3 g 0  . calcium carbonate (OS-CAL) 600 MG TABS tablet Take 600 mg by mouth daily with breakfast.     . cetirizine (ZYRTEC) 10 MG tablet Take 10 mg by mouth daily.    .  diphenhydrAMINE (BENADRYL) 25 MG tablet Take 25 mg by mouth at bedtime as needed.    Marland Kitchen escitalopram (LEXAPRO) 10 MG tablet Take 1 tablet (10 mg total) by mouth daily. 30 tablet 5  . lidocaine (LIDODERM) 5 % Place 1 patch onto the skin daily. Remove & Discard patch within 12 hours or as directed by MD 30 patch 0  . MULTIPLE VITAMIN PO Take 1 tablet by mouth daily.     . pantoprazole (PROTONIX) 40 MG tablet Take 1 tablet (40 mg total) by mouth daily. 90 tablet 1  . valACYclovir (VALTREX) 1000 MG tablet Take 1,000 mg by mouth 2 (two) times daily.     Marland Kitchen atorvastatin (LIPITOR) 40 MG tablet Take 1 tablet (40 mg total) by mouth daily. 30 tablet 3  . losartan (COZAAR) 50 MG tablet Take 2 tablets (100 mg total) by mouth daily. 60 tablet 2   No facility-administered medications prior to visit.     Review of Systems;  Patient denies headache, fevers, malaise, unintentional weight loss, skin rash, eye pain, sinus congestion and sinus pain, sore throat, dysphagia,  hemoptysis , cough, dyspnea, wheezing, chest pain, palpitations, orthopnea, edema, abdominal pain, nausea, melena, diarrhea, constipation, flank pain, dysuria, hematuria, urinary  Frequency, nocturia, numbness, tingling, seizures,  Focal weakness, Loss of consciousness,  Tremor, insomnia, depression, anxiety, and suicidal ideation.  Objective:  BP (!) 112/58 (BP Location: Left Arm, Patient Position: Sitting, Cuff Size: Normal)   Pulse (!) 103   Temp 97.6 F (36.4 C) (Oral)   Resp 16   Ht 5' 3.5" (1.613 m)   Wt 128 lb (58.1 kg)   SpO2 97%   BMI 22.32 kg/m   BP Readings from Last 3 Encounters:  11/11/16 (!) 112/58  09/20/16 124/72  07/25/16 (!) 142/76    Wt Readings from Last 3 Encounters:  11/11/16 128 lb (58.1 kg)  09/20/16 129 lb 1.9 oz (58.6 kg)  07/25/16 133 lb 12.8 oz (60.7 kg)    General appearance: alert, cooperative and appears stated age Ears: normal TM's and external ear canals both ears Throat: lips, mucosa,  and tongue normal; teeth and gums normal Neck: no adenopathy, no carotid bruit, supple, symmetrical, trachea midline and thyroid not enlarged, symmetric, no tenderness/mass/nodules Back: symmetric, no curvature. ROM normal. No CVA tenderness. Lungs: clear to auscultation bilaterally Heart: regular rate and rhythm, S1, S2 normal, no murmur, click, rub or gallop Abdomen: soft, tender in mid epigastric area with non pulsatile mass noted.  Pulses: 2+ and symmetric Skin: Skin color, texture, turgor normal. No rashes or lesions Lymph nodes: Cervical, supraclavicular, and axillary nodes normal.  Lab Results  Component Value Date   HGBA1C 6.1 11/10/2016   HGBA1C 6.4 07/01/2016   HGBA1C 5.9 (H) 01/12/2016    Lab Results  Component Value Date   CREATININE 0.58 11/11/2016   CREATININE 0.56 11/10/2016   CREATININE 0.57 07/01/2016   nificant retropulsion of the posterior margin of the T7 vertebral body. No other vertebral body compression fracture. No aggressive osseous lesion. Paraspinal and other soft tissues: No paraspinal abnormality. Thoracic aortic atherosclerosis. Bibasilar atelectasis. Severe bilateral centrilobular and paraseptal emphysema. Disc levels: Disc spaces are relatively well maintained. No foraminal or central canal stenosis. IMPRESSION: 1. Acute versus subacute T7 vertebral body compression fracture with approximately 50% height loss. 2. Chronic T3 and T5 vertebral body compression fractures. 3.  Emphysema. (ICD10-J43.9) 4.  Aortic Atherosclerosis (ICD10-170.0) Electronically Signed   By: Kathreen Devoid   On: 06/10/2016 11:11    Assessment & Plan:   Problem List Items Addressed This Visit    Elevated liver enzymes    With new onset abdominal discomfrt ,  Unintentional weight loss and tender mass on exam.  Given her age , severe COPD secondary to tobacco abuse,  Need to rule out Ca.  CT abd and pelvis ordered. Statin suspended.       Diabetes mellitus without complication ([place])      Diagnosed with fasting glucose of 125 and a1c 6.4 .  Improved with low gi diet.  no medications needed   Lab Results  Component Value Date   HGBA1C 6.1 11/10/2016   Lab Results  Component Value Date   MICROALBUR <0.7 01/28/2016         Relevant Medications   losartan (COZAAR) 50 MG tablet   Age-related osteoporosis with current pathol fracture of vertebra (HCC)    Post menopausal,  aggravated by years of tobacco abuse.  Apparently the diagnosis is old,  As she has evidence of prior vertebral fractures and has  taken alendronate and boniva in the past.  She is tolerating weekly aledronate  Lab Results  Component Value Date   CREATININE 0.58 11/11/2016          Other Visit Diagnoses    Epigastric pain    -  Primary   Relevant Orders  CT Abdomen Pelvis W Contrast   Lipase (Completed)   Weight loss       Relevant Orders   CT Abdomen Pelvis W Contrast   Intra-abdominal and pelvic swelling, mass and lump, unspecified site       Relevant Orders   CT Abdomen Pelvis W Contrast   Hyponatremia       Relevant Orders   Comprehensive metabolic panel (Completed)      I have discontinued Ms. Pavey's atorvastatin. I am also having her maintain her MULTIPLE VITAMIN PO, calcium carbonate, valACYclovir, albuterol, diphenhydrAMINE, acetaminophen, benzocaine, lidocaine, pantoprazole, alendronate, escitalopram, cetirizine, and losartan.  Meds ordered this encounter  Medications  . losartan (COZAAR) 50 MG tablet    Sig: Take 2 tablets (100 mg total) by mouth daily.    Dispense:  60 tablet    Refill:  2    Medications Discontinued During This Encounter  Medication Reason  . losartan (COZAAR) 50 MG tablet Reorder  . atorvastatin (LIPITOR) 40 MG tablet     Follow-up: No Follow-up on file.   [name], MD

## 2016-11-11 NOTE — Patient Instructions (Addendum)
1) Your blood pressure is under excellent control . You can try reducing your losartan to 50 mg daily,  And if your BP remains < 140/80 , you can continue the 50 mg dose  Your sodium  Is low.  You may be dehydrated,   I recommend adding a glass  of Gatorade to increase your salt and electrolytes try drinking G2 red ("fruit punch") and I am repeating your level today  Your goal is 48 to 64 ounces of noncaffeinated liquid daily   2) Your diabetes is under excellent control!! You can eat cheese and crackers for snack if you find the WASA brand  Try the ATkins lemons bar, or the Power Crunch bars  Both are < 5 g sugar   Peanut butter is fine.  3)Your liver ezymes are elevated,  This may be due to atorvastatin.  Resume simvastatin and we will recheck the liver enzymes in 3-4 weeks   4) I am ordering a CT of your Abdomen because of your belly pain and the swelling that I felt  on your exam.  It will be set up for you next week

## 2016-11-12 ENCOUNTER — Encounter: Payer: Self-pay | Admitting: Internal Medicine

## 2016-11-12 DIAGNOSIS — R748 Abnormal levels of other serum enzymes: Secondary | ICD-10-CM | POA: Insufficient documentation

## 2016-11-12 NOTE — Assessment & Plan Note (Signed)
Diagnosed with fasting glucose of 125 and a1c 6.4 .  Improved with low gi diet.  no medications needed   Lab Results  Component Value Date   HGBA1C 6.1 11/10/2016   Lab Results  Component Value Date   MICROALBUR <0.7 01/28/2016    

## 2016-11-12 NOTE — Assessment & Plan Note (Signed)
With new onset abdominal discomfrt ,  Unintentional weight loss and tender mass on exam.  Given her age , severe COPD secondary to tobacco abuse,  Need to rule out Ca.  CT abd and pelvis ordered. Statin suspended.

## 2016-11-12 NOTE — Assessment & Plan Note (Signed)
Post menopausal,  aggravated by years of tobacco abuse.  Apparently the diagnosis is old,  As she has evidence of prior vertebral fractures and has  taken alendronate and boniva in the past.  She is tolerating weekly aledronate  Lab Results  Component Value Date   CREATININE 0.58 11/11/2016

## 2016-11-22 ENCOUNTER — Ambulatory Visit
Admission: RE | Admit: 2016-11-22 | Discharge: 2016-11-22 | Disposition: A | Discharge status: Home / Self Care | Payer: Medicare Other | Source: Ambulatory Visit | Attending: Internal Medicine | Admitting: Internal Medicine

## 2016-11-22 ENCOUNTER — Encounter: Payer: Self-pay | Admitting: Internal Medicine

## 2016-11-22 DIAGNOSIS — N289 Disorder of kidney and ureter, unspecified: Secondary | ICD-10-CM | POA: Insufficient documentation

## 2016-11-22 DIAGNOSIS — R634 Abnormal weight loss: Secondary | ICD-10-CM | POA: Diagnosis not present

## 2016-11-22 DIAGNOSIS — R1900 Intra-abdominal and pelvic swelling, mass and lump, unspecified site: Secondary | ICD-10-CM | POA: Diagnosis not present

## 2016-11-22 DIAGNOSIS — K8689 Other specified diseases of pancreas: Secondary | ICD-10-CM | POA: Diagnosis not present

## 2016-11-22 DIAGNOSIS — K838 Other specified diseases of biliary tract: Secondary | ICD-10-CM | POA: Diagnosis not present

## 2016-11-22 DIAGNOSIS — K439 Ventral hernia without obstruction or gangrene: Secondary | ICD-10-CM | POA: Diagnosis not present

## 2016-11-22 DIAGNOSIS — M899 Disorder of bone, unspecified: Secondary | ICD-10-CM | POA: Insufficient documentation

## 2016-11-22 DIAGNOSIS — R109 Unspecified abdominal pain: Secondary | ICD-10-CM | POA: Diagnosis not present

## 2016-11-22 DIAGNOSIS — I700 Atherosclerosis of aorta: Secondary | ICD-10-CM | POA: Insufficient documentation

## 2016-11-22 DIAGNOSIS — K5730 Diverticulosis of large intestine without perforation or abscess without bleeding: Secondary | ICD-10-CM | POA: Diagnosis not present

## 2016-11-22 DIAGNOSIS — R1013 Epigastric pain: Secondary | ICD-10-CM | POA: Insufficient documentation

## 2016-11-22 DIAGNOSIS — I708 Atherosclerosis of other arteries: Secondary | ICD-10-CM | POA: Diagnosis not present

## 2016-11-22 MED ORDER — IOPAMIDOL (ISOVUE-300) INJECTION 61%
100.0000 mL | Freq: Once | INTRAVENOUS | Status: AC | PRN
  Administered 2016-11-22: 100 mL via INTRAVENOUS

## 2016-11-24 ENCOUNTER — Ambulatory Visit (INDEPENDENT_AMBULATORY_CARE_PROVIDER_SITE_OTHER): Payer: Medicare Other | Admitting: Internal Medicine

## 2016-11-24 ENCOUNTER — Encounter: Payer: Self-pay | Admitting: Internal Medicine

## 2016-11-24 VITALS — BP 118/64 | HR 84 | Temp 97.8°F | Resp 16 | Ht 63.5 in | Wt 127.8 lb

## 2016-11-24 DIAGNOSIS — Z9049 Acquired absence of other specified parts of digestive tract: Secondary | ICD-10-CM | POA: Diagnosis not present

## 2016-11-24 DIAGNOSIS — R1900 Intra-abdominal and pelvic swelling, mass and lump, unspecified site: Secondary | ICD-10-CM | POA: Diagnosis not present

## 2016-11-24 DIAGNOSIS — N2889 Other specified disorders of kidney and ureter: Secondary | ICD-10-CM | POA: Diagnosis not present

## 2016-11-24 DIAGNOSIS — K869 Disease of pancreas, unspecified: Secondary | ICD-10-CM | POA: Diagnosis not present

## 2016-11-24 DIAGNOSIS — K8689 Other specified diseases of pancreas: Secondary | ICD-10-CM

## 2016-11-24 MED ORDER — ALPRAZOLAM 0.25 MG PO TABS: 0.2500 mg | ORAL_TABLET | Freq: Two times a day (BID) | ORAL | 0 refills | Status: DC | PRN

## 2016-11-24 NOTE — Progress Notes (Signed)
Subjective:  Patient ID: Mary Howe, female    DOB: December 26, 1932  Age: 83 y.o. MRN: [id number]  CC: The primary encounter diagnosis was Pancreatic mass. Diagnoses of Intra-abdominal and pelvic swelling, mass and lump, unspecified site, Other specified disorders of kidney and ureter, Left kidney mass, Left renal mass, and S/P cholecystectomy were also pertinent to this visit.  HPI Mary Howe presents for follow up on results of a recently ordered contrasted  CT scan of abdomen done to evaluate complaint of abdominal pain and weight loss in the setting of abdominal mass.   Results of CT reviewed with the patient and her daughter Meredith Mody, specifically  The possibility of a mass in the head of the pancreas as well as pancreatic calcifications and  a left kidney cyst vs  mass .  Atherosclerotic placque,  Diverticulosis also noted.  Patient has records from prior physician include a CT report from 2007 noting pancreatic calcifications and a left renal foci too small to charcaterize.  2010 ultrasound of RUQ noted gallstones,  With a normal appearing  pancreatic head.    She has been suspending her statin since early august due to elevated liver enzymes (ALT) .   She has been taking protonix chronically, but has reduced it to  every other day  For GERD . Review of EGD report  From 2008 notes Barrett Esophagus.   Outpatient Medications Prior to Visit  Medication Sig Dispense Refill  . acetaminophen (TYLENOL) 500 MG tablet Take 500 mg by mouth every 6 (six) hours as needed.    Marland Kitchen albuterol (PROVENTIL HFA;VENTOLIN HFA) 108 (90 Base) MCG/ACT inhaler Inhale 1-2 puffs into the lungs every 4 (four) hours as needed for wheezing or shortness of breath. 1 Inhaler 10  . alendronate (FOSAMAX) 70 MG tablet Take 1 tablet (70 mg total) by mouth every 7 (seven) days. Take with a full glass of water on an empty stomach. 4 tablet 11  . benzocaine (ORAJEL) 10 % mucosal gel Use as directed in the mouth or throat 3 (three)  times daily as needed for mouth pain. 5.3 g 0  . calcium carbonate (OS-CAL) 600 MG TABS tablet Take 600 mg by mouth daily with breakfast.     . cetirizine (ZYRTEC) 10 MG tablet Take 10 mg by mouth daily.    . diphenhydrAMINE (BENADRYL) 25 MG tablet Take 25 mg by mouth at bedtime as needed.    Marland Kitchen escitalopram (LEXAPRO) 10 MG tablet Take 1 tablet (10 mg total) by mouth daily. 30 tablet 5  . lidocaine (LIDODERM) 5 % Place 1 patch onto the skin daily. Remove & Discard patch within 12 hours or as directed by MD 30 patch 0  . losartan (COZAAR) 50 MG tablet Take 2 tablets (100 mg total) by mouth daily. (Patient taking differently: Take 50 mg by mouth daily. ) 60 tablet 2  . MULTIPLE VITAMIN PO Take 1 tablet by mouth daily.     . pantoprazole (PROTONIX) 40 MG tablet Take 1 tablet (40 mg total) by mouth daily. 90 tablet 1  . valACYclovir (VALTREX) 1000 MG tablet Take 1,000 mg by mouth 2 (two) times daily.      No facility-administered medications prior to visit.     Review of Systems;  Patient denies headache, fevers, malaise,  skin rash, eye pain, sinus congestion and sinus pain, sore throat, dysphagia,  hemoptysis , cough, dyspnea, wheezing, chest pain, palpitations, orthopnea, edema, , nausea, melena, diarrhea, constipation, flank pain, dysuria, hematuria, urinary  Frequency, nocturia,  numbness, tingling, seizures,  Focal weakness, Loss of consciousness,  Tremor, insomnia, depression, anxiety, and suicidal ideation.      Objective:  BP 118/64 (BP Location: Left Arm, Patient Position: Sitting, Cuff Size: Normal)   Pulse 84   Temp 97.8 F (36.6 C) (Oral)   Resp 16   Ht 5' 3.5" (1.613 m)   Wt 127 lb 12.8 oz (58 kg)   SpO2 95%   BMI 22.28 kg/m   BP Readings from Last 3 Encounters:  11/24/16 118/64  11/11/16 (!) 112/58  09/20/16 124/72    Wt Readings from Last 3 Encounters:  11/24/16 127 lb 12.8 oz (58 kg)  11/11/16 128 lb (58.1 kg)  09/20/16 129 lb 1.9 oz (58.6 kg)    General  appearance: alert, cooperative and appears stated age Ears: normal TM's and external ear canals both ears Throat: lips, mucosa, and tongue normal; teeth and gums normal Neck: no adenopathy, no carotid bruit, supple, symmetrical, trachea midline and thyroid not enlarged, symmetric, no tenderness/mass/nodules Back: symmetric, no curvature. ROM normal. No CVA tenderness. Lungs: clear to auscultation bilaterally Heart: regular rate and rhythm, S1, S2 normal, no murmur, click, rub or gallop Abdomen: soft, tender in mid epigastric region mass appreciated.  bowel sounds normal; no masses,  no organomegaly Pulses: 2+ and symmetric Skin: Skin color, texture, turgor normal. No rashes or lesions Lymph nodes: Cervical, supraclavicular, and axillary nodes normal.  Lab Results  Component Value Date   HGBA1C 6.1 11/10/2016   HGBA1C 6.4 07/01/2016   HGBA1C 5.9 (H) 01/12/2016    Lab Results  Component Value Date   CREATININE 0.58 11/11/2016   CREATININE 0.56 11/10/2016   CREATININE 0.57 07/01/2016    Lab Results  Component Value Date   WBC 13.9 (H) 06/10/2016   HGB 14.4 06/10/2016   HCT 41.7 06/10/2016   PLT 230 06/10/2016   GLUCOSE 123 (H) 11/11/2016   CHOL 151 11/10/2016   TRIG 56.0 11/10/2016   HDL 93.80 11/10/2016   LDLDIRECT 89.0 07/01/2016   LDLCALC 46 11/10/2016   ALT 40 (H) 11/11/2016   AST 36 11/11/2016   NA 129 (L) 11/11/2016   K 4.5 11/11/2016   CL 95 (L) 11/11/2016   CREATININE 0.58 11/11/2016   BUN 15 11/11/2016   CO2 28 11/11/2016   TSH 4.83 (H) 11/10/2016   HGBA1C 6.1 11/10/2016   MICROALBUR <0.7 01/28/2016    Ct Abdomen Pelvis W Contrast  Result Date: 11/22/2016 CLINICAL DATA:  Abdominal pain and weight loss EXAM: CT ABDOMEN AND PELVIS WITH CONTRAST TECHNIQUE: Multidetector CT imaging of the abdomen and pelvis was performed using the standard protocol following bolus administration of intravenous contrast. Oral contrast was also administered. CONTRAST:  169mL  ISOVUE-300 IOPAMIDOL (ISOVUE-300) INJECTION 61% COMPARISON:  None. FINDINGS: Lower chest: There is scarring with fibrotic change in the lung bases. Hepatobiliary: No focal liver lesions are evident. Gallbladder is absent. There is slight intrahepatic biliary duct dilatation. There is no common bile duct dilatation. Pancreas: There is soft tissue fullness in the pancreatic head region with attenuation in this area similar to the remainder the pancreas. This area of fullness is best appreciated on coronal and sagittal images and measures approximately 3.4 x 3.4 cm. There is no appreciable pancreatic duct dilatation. There are several calcifications in the medial head and uncinate process regions of the pancreas, most likely residua of previous pancreatitis. Spleen: No splenic lesions are evident. Adrenals/Urinary Tract: There is stable left adrenal hyperplasia. Right adrenal appears unremarkable. There is  a 1.3 x 1.0 cm mass in the posterior upper pole of the left kidney which has attenuation values higher than is expected with a simple cyst. There are multiple cysts in both kidneys, largest in the posterior mid right kidney measuring 3.9 x 3.5 cm. There is no appreciable hydronephrosis on either side. There is an extrarenal pelvis on each side, an anatomic variant. There are no renal or ureteral calculi on either side. Urinary bladder is midline with wall thickness within normal limits. Stomach/Bowel: There are multiple sigmoid diverticula without diverticulitis. There is no bowel wall or mesenteric thickening. There is no appreciable bowel obstruction. No free air or portal venous air. Vascular/Lymphatic: There is no abdominal aortic aneurysm. There is atherosclerotic vascular calcification in the aorta and iliac arteries bilaterally. There are foci of mesenteric vascular calcification without major mesenteric vessel obstruction. No adenopathy is evident in the abdomen or pelvis. Reproductive: Uterus is anteverted.  No pelvic mass or pelvic fluid collection. Other: Appendix appears normal. No abscess or ascites is appreciable in the abdomen or pelvis. There is a focal ventral hernia containing only fat. Musculoskeletal: There is a benign appearing sclerotic lesion in the medial intertrochanteric region on the left measuring 1.9 x 1.7 cm. There are no lytic or destructive bone lesions. There is no intramuscular or abdominal wall lesion. IMPRESSION: 1. Soft tissue fullness in the head of the pancreas with concern for pancreatic mass, essentially of the same attenuation as the adjacent pancreatic parenchyma. This area warrants further surveillance. Would advise pancreatic MR pre and post-contrast to further evaluate. If there is a contraindication to MR, CT of the pancreas pre and post-contrast would be an alternative method for further assessment of the pancreas. 2. There is a 1.3 x 1.0 cm mass in the posterior aspect of the upper pole left kidney which cannot be classified as a cyst. Further assessment of this lesion is advised. Further evaluation with pre and post contrast MRI should be considered. Pre and post contrast CT could alternatively be performed, but would likely be of decreased accuracy given lesion size. 3. Areas of calcification in portions of the head and uncinate process of pancreas consistent chronic pancreatitis. 4. Extensive sigmoid diverticulosis without frank diverticulitis. No bowel obstruction. No abscess. Appendix appears normal. 5.  Aortoiliac atherosclerosis. 6. Predominantly sclerotic benign-appearing lesion in the inferior intertrochanteric region of the left proximal femur measuring 1.9 x 1.7 cm. 7.  Ventral hernia containing only fat. 8.  Gallbladder absent.  Mild intrahepatic biliary duct dilatation. These results will be called to the ordering clinician or representative by the Radiologist Assistant, and communication documented in the PACS or zVision Dashboard. Aortic Atherosclerosis  (ICD10-I70.0). Electronically Signed   By: Lowella Grip III M.D.   On: 11/22/2016 14:24    Assessment & Plan:   Problem List Items Addressed This Visit    S/P cholecystectomy   Pancreatic mass - Primary    Suspected on CT,  With abd pain , abd mass and 6 lb eight loss since April. MRi abd ordered.       Relevant Orders   MR Abdomen W Wo Contrast   Left renal mass    Suspected on CT ,  MRI ordered        Other Visit Diagnoses    Intra-abdominal and pelvic swelling, mass and lump, unspecified site       Other specified disorders of kidney and ureter       Left kidney mass  Relevant Orders   MR Abdomen W Wo Contrast    A total of 25 minutes of face to face time was spent with patient more than half of which was spent in counselling about the above mentioned conditions  and coordination of care   I am having Ms. Cosma start on ALPRAZolam. I am also having her maintain her MULTIPLE VITAMIN PO, calcium carbonate, valACYclovir, albuterol, diphenhydrAMINE, acetaminophen, benzocaine, lidocaine, pantoprazole, alendronate, escitalopram, cetirizine, and losartan.  Meds ordered this encounter  Medications  . ALPRAZolam (XANAX) 0.25 MG tablet    Sig: Take 1 tablet (0.25 mg total) by mouth 2 (two) times daily as needed for anxiety.    Dispense:  20 tablet    Refill:  0    There are no discontinued medications.  Follow-up: No Follow-up on file.   [name], MD

## 2016-11-24 NOTE — Patient Instructions (Addendum)
Continue every other day protonix if it keeps your symptoms of reflux from recurring.    I am ordering an MRI  of your abdomen to evaluate the abnormality in your pancreas and your left kidney  I have prescribed alprazolam for your nerves.  Take it 30 to 60 minutes before your MRI so you don't freak out.  Let Gwen drive you .

## 2016-11-25 ENCOUNTER — Encounter: Payer: Self-pay | Admitting: Internal Medicine

## 2016-11-25 DIAGNOSIS — Z9049 Acquired absence of other specified parts of digestive tract: Secondary | ICD-10-CM | POA: Insufficient documentation

## 2016-11-25 DIAGNOSIS — K8689 Other specified diseases of pancreas: Secondary | ICD-10-CM | POA: Insufficient documentation

## 2016-11-25 DIAGNOSIS — N2889 Other specified disorders of kidney and ureter: Secondary | ICD-10-CM | POA: Insufficient documentation

## 2016-11-25 DIAGNOSIS — N281 Cyst of kidney, acquired: Secondary | ICD-10-CM | POA: Insufficient documentation

## 2016-11-25 NOTE — Assessment & Plan Note (Signed)
Suspected on CT ,  MRI ordered

## 2016-11-25 NOTE — Assessment & Plan Note (Signed)
Suspected on CT,  With abd pain , abd mass and 6 lb eight loss since April. MRi abd ordered.

## 2016-12-06 ENCOUNTER — Ambulatory Visit
Admission: RE | Admit: 2016-12-06 | Discharge: 2016-12-06 | Disposition: A | Discharge status: Home / Self Care | Payer: Medicare Other | Source: Ambulatory Visit | Attending: Internal Medicine | Admitting: Internal Medicine

## 2016-12-06 DIAGNOSIS — R58 Hemorrhage, not elsewhere classified: Secondary | ICD-10-CM | POA: Insufficient documentation

## 2016-12-06 DIAGNOSIS — K869 Disease of pancreas, unspecified: Secondary | ICD-10-CM | POA: Insufficient documentation

## 2016-12-06 DIAGNOSIS — N2889 Other specified disorders of kidney and ureter: Secondary | ICD-10-CM

## 2016-12-06 DIAGNOSIS — N281 Cyst of kidney, acquired: Secondary | ICD-10-CM | POA: Insufficient documentation

## 2016-12-06 DIAGNOSIS — K8689 Other specified diseases of pancreas: Secondary | ICD-10-CM

## 2016-12-06 MED ORDER — GADOBENATE DIMEGLUMINE 529 MG/ML IV SOLN
15.0000 mL | Freq: Once | INTRAVENOUS | Status: AC | PRN
  Administered 2016-12-06: 11 mL via INTRAVENOUS

## 2016-12-07 ENCOUNTER — Encounter: Payer: Self-pay | Admitting: Internal Medicine

## 2016-12-08 NOTE — Telephone Encounter (Signed)
Pt called wanting Dr Derrel Nip to know that she read her message and Thank you for the good news. Thank you!

## 2016-12-14 ENCOUNTER — Ambulatory Visit (INDEPENDENT_AMBULATORY_CARE_PROVIDER_SITE_OTHER): Payer: Medicare Other | Admitting: Internal Medicine

## 2016-12-14 ENCOUNTER — Encounter: Payer: Self-pay | Admitting: Internal Medicine

## 2016-12-14 VITALS — BP 118/60 | HR 85 | Temp 98.2°F | Resp 15 | Ht 63.5 in | Wt 128.4 lb

## 2016-12-14 DIAGNOSIS — R0683 Snoring: Secondary | ICD-10-CM | POA: Diagnosis not present

## 2016-12-14 DIAGNOSIS — J9611 Chronic respiratory failure with hypoxia: Secondary | ICD-10-CM | POA: Diagnosis not present

## 2016-12-14 DIAGNOSIS — I878 Other specified disorders of veins: Secondary | ICD-10-CM | POA: Diagnosis not present

## 2016-12-14 DIAGNOSIS — M8008XD Age-related osteoporosis with current pathological fracture, vertebra(e), subsequent encounter for fracture with routine healing: Secondary | ICD-10-CM

## 2016-12-14 DIAGNOSIS — R748 Abnormal levels of other serum enzymes: Secondary | ICD-10-CM | POA: Diagnosis not present

## 2016-12-14 DIAGNOSIS — Z23 Encounter for immunization: Secondary | ICD-10-CM | POA: Diagnosis not present

## 2016-12-14 DIAGNOSIS — E7800 Pure hypercholesterolemia, unspecified: Secondary | ICD-10-CM | POA: Diagnosis not present

## 2016-12-14 DIAGNOSIS — G8929 Other chronic pain: Secondary | ICD-10-CM

## 2016-12-14 DIAGNOSIS — N2889 Other specified disorders of kidney and ureter: Secondary | ICD-10-CM

## 2016-12-14 DIAGNOSIS — Z789 Other specified health status: Secondary | ICD-10-CM

## 2016-12-14 DIAGNOSIS — M546 Pain in thoracic spine: Secondary | ICD-10-CM

## 2016-12-14 DIAGNOSIS — J431 Panlobular emphysema: Secondary | ICD-10-CM | POA: Diagnosis not present

## 2016-12-14 DIAGNOSIS — E119 Type 2 diabetes mellitus without complications: Secondary | ICD-10-CM

## 2016-12-14 LAB — LIPID PANEL
CHOLESTEROL: 188 mg/dL (ref 0–200)
HDL: 88.8 mg/dL (ref >39.00)
LDL Cholesterol: 84 mg/dL (ref 0–99)
NONHDL: 99.34
TRIGLYCERIDES: 79 mg/dL (ref 0.0–149.0)
Total CHOL/HDL Ratio: 2
VLDL: 15.8 mg/dL (ref 0.0–40.0)

## 2016-12-14 LAB — COMPREHENSIVE METABOLIC PANEL
ALK PHOS: 68 U/L (ref 39–117)
ALT: 20 U/L (ref 0–35)
AST: 23 U/L (ref 0–37)
Albumin: 4.2 g/dL (ref 3.5–5.2)
BILIRUBIN TOTAL: 0.7 mg/dL (ref 0.2–1.2)
BUN: 15 mg/dL (ref 6–23)
CALCIUM: 9.5 mg/dL (ref 8.4–10.5)
CO2: 27 mEq/L (ref 19–32)
Chloride: 98 mEq/L (ref 96–112)
Creatinine, Ser: 0.47 mg/dL (ref 0.40–1.20)
GFR: 134.75 mL/min (ref >60.00)
Glucose, Bld: 103 mg/dL — ABNORMAL HIGH (ref 70–99)
Potassium: 4.1 mEq/L (ref 3.5–5.1)
Sodium: 133 mEq/L — ABNORMAL LOW (ref 135–145)
TOTAL PROTEIN: 6.6 g/dL (ref 6.0–8.3)

## 2016-12-14 NOTE — Progress Notes (Signed)
Subjective:  Patient ID: Mary Howe, female    DOB: [birth year]  Age: 83 y.o. MRN: [id number]  CC: The primary encounter diagnosis was Hypercholesterolemia. Diagnoses of Elevated liver enzymes, Snoring, Panlobular emphysema (HCC), Chronic respiratory failure with hypoxia ([place]), Encounter for immunization, Chronic midline thoracic back pain, Age-related osteoporosis with current pathological fracture of vertebra with routine healing, subsequent encounter, Diabetes mellitus without complication ([place]), Statin intolerance, Stasis, venous, and Left renal mass were also pertinent to this visit.  HPI Mary Howe presents for follow up on several issues  1)  recent imaging study to evaluate a suspected  pancreatic mass seen on an abdominal CT:  No pancreatic mass seen on the dedicated  MRI:   ,   2) Hypertension; feeling better since the medication reduction, without high readings.   3) COPD, advanced . She has been walking daily and measuring her ambulatory sats on room air and all have been in the 90's .  She hs not had an overnight pulse oximetry  Or a home or  Facility based sleep study to rule out nocturnal hypoxia.   4) Statin intolerance she was noted to have an elevation of alk phos and ALT after starting statin.  She has suspended the statin for several weeks, .  5) Osteoporosis:  With history of vertebral fracture: she is tolerating fosomax  Using anxiety meds prn  Outpatient Medications Prior to Visit  Medication Sig Dispense Refill  . acetaminophen (TYLENOL) 500 MG tablet Take 500 mg by mouth every 6 (six) hours as needed.    Marland Kitchen albuterol (PROVENTIL HFA;VENTOLIN HFA) 108 (90 Base) MCG/ACT inhaler Inhale 1-2 puffs into the lungs every 4 (four) hours as needed for wheezing or shortness of breath. 1 Inhaler 10  . alendronate (FOSAMAX) 70 MG tablet Take 1 tablet (70 mg total) by mouth every 7 (seven) days. Take with a full glass of water on an empty stomach. 4 tablet 11  . ALPRAZolam  (XANAX) 0.25 MG tablet Take 1 tablet (0.25 mg total) by mouth 2 (two) times daily as needed for anxiety. 20 tablet 0  . benzocaine (ORAJEL) 10 % mucosal gel Use as directed in the mouth or throat 3 (three) times daily as needed for mouth pain. 5.3 g 0  . calcium carbonate (OS-CAL) 600 MG TABS tablet Take 600 mg by mouth daily with breakfast.     . cetirizine (ZYRTEC) 10 MG tablet Take 10 mg by mouth daily.    . diphenhydrAMINE (BENADRYL) 25 MG tablet Take 25 mg by mouth at bedtime as needed.    . lidocaine (LIDODERM) 5 % Place 1 patch onto the skin daily. Remove & Discard patch within 12 hours or as directed by MD 30 patch 0  . losartan (COZAAR) 50 MG tablet Take 2 tablets (100 mg total) by mouth daily. (Patient taking differently: Take 50 mg by mouth daily. ) 60 tablet 2  . MULTIPLE VITAMIN PO Take 1 tablet by mouth daily.     . valACYclovir (VALTREX) 1000 MG tablet Take 1,000 mg by mouth 2 (two) times daily.     Marland Kitchen escitalopram (LEXAPRO) 10 MG tablet Take 1 tablet (10 mg total) by mouth daily. 30 tablet 5  . pantoprazole (PROTONIX) 40 MG tablet Take 1 tablet (40 mg total) by mouth daily. 90 tablet 1   No facility-administered medications prior to visit.     Review of Systems;  Patient denies headache, fevers, malaise, unintentional weight loss, skin rash, eye pain, sinus congestion and sinus  pain, sore throat, dysphagia,  hemoptysis , cough, dyspnea, wheezing, chest pain, palpitations, orthopnea, edema, abdominal pain, nausea, melena, diarrhea, constipation, flank pain, dysuria, hematuria, urinary  Frequency, nocturia, numbness, tingling, seizures,  Focal weakness, Loss of consciousness,  Tremor, insomnia, depression, anxiety, and suicidal ideation.      Objective:  BP 118/60 (BP Location: Left Arm, Patient Position: Sitting, Cuff Size: Normal)   Pulse 85   Temp 98.2 F (36.8 C) (Oral)   Resp 15   Ht 5' 3.5" (1.613 m)   Wt 128 lb 6.4 oz (58.2 kg)   SpO2 93%   BMI 22.39 kg/m   BP  Readings from Last 3 Encounters:  12/14/16 118/60  11/24/16 118/64  11/11/16 (!) 112/58    Wt Readings from Last 3 Encounters:  12/14/16 128 lb 6.4 oz (58.2 kg)  11/24/16 127 lb 12.8 oz (58 kg)  11/11/16 128 lb (58.1 kg)    General appearance: alert, cooperative and appears stated age Ears: normal TM's and external ear canals both ears Throat: lips, mucosa, and tongue normal; teeth and gums normal Neck: no adenopathy, no carotid bruit, supple, symmetrical, trachea midline and thyroid not enlarged, symmetric, no tenderness/mass/nodules Back: symmetric, no curvature. ROM normal. No CVA tenderness. Lungs: clear to auscultation bilaterally Heart: regular rate and rhythm, S1, S2 normal, no murmur, click, rub or gallop Abdomen: soft, non-tender; bowel sounds normal; no masses,  no organomegaly Pulses: 2+ and symmetric Skin: Skin color, texture, turgor normal. No rashes or lesions Lymph nodes: Cervical, supraclavicular, and axillary nodes normal.  Lab Results  Component Value Date   HGBA1C 6.1 11/10/2016   HGBA1C 6.4 07/01/2016   HGBA1C 5.9 (H) 01/12/2016    Lab Results  Component Value Date   CREATININE 0.47 12/14/2016   CREATININE 0.58 11/11/2016   CREATININE 0.56 11/10/2016    Lab Results  Component Value Date   WBC 13.9 (H) 06/10/2016   HGB 14.4 06/10/2016   HCT 41.7 06/10/2016   PLT 230 06/10/2016   GLUCOSE 103 (H) 12/14/2016   CHOL 188 12/14/2016   TRIG 79.0 12/14/2016   HDL 88.80 12/14/2016   LDLDIRECT 89.0 07/01/2016   LDLCALC 84 12/14/2016   ALT 20 12/14/2016   AST 23 12/14/2016   NA 133 (L) 12/14/2016   K 4.1 12/14/2016   CL 98 12/14/2016   CREATININE 0.47 12/14/2016   BUN 15 12/14/2016   CO2 27 12/14/2016   TSH 4.83 (H) 11/10/2016   HGBA1C 6.1 11/10/2016   MICROALBUR <0.7 01/28/2016    Mr Abdomen W Wo Contrast  Result Date: 12/06/2016 CLINICAL DATA:  Abdominal pain, weight loss, possible pancreatic lesion, renal lesion EXAM: MRI ABDOMEN WITHOUT AND  WITH CONTRAST TECHNIQUE: Multiplanar multisequence MR imaging of the abdomen was performed both before and after the administration of intravenous contrast. CONTRAST:  46m MULTIHANCE GADOBENATE DIMEGLUMINE 529 MG/ML IV SOLN COMPARISON:  CT abdomen/ pelvis dated 11/22/2016 FINDINGS: Lower chest: Lung bases are clear. Hepatobiliary: Liver is within normal limits. Status post cholecystectomy. No intrahepatic or extrahepatic duct dilatation. Pancreas: Mild soft tissue prominence involving the pancreatic head, although without differential enhancement, ductal dilatation, or atrophy of the pancreatic body/ tail. As such, this is favored to reflect a normal variant rather than a true pancreatic mass. Spleen:  Within normal limits. Adrenals/Urinary Tract:  Adrenal glands are within normal limits. Bilateral renal cysts, measuring up to 4.2 cm in the posterior right lower kidney (series 20/ image 18). 10 mm lesion with layering hemorrhage in the posterior left lower  kidney (series 20/ image 19), benign (Bosniak II). 11 mm hyperdense lesion in the medial left upper kidney (series 5/image 8), without enhancement on postcontrast subtraction imaging (series 14/ image 18), reflecting a benign hemorrhagic cyst and corresponding to the CT abnormality. No suspicious/enhancing renal lesions.  No hydronephrosis. Stomach/Bowel: Stomach is within normal limits. Visualized bowel is unremarkable. Vascular/Lymphatic:  No evidence of abdominal aortic aneurysm. No suspicious abdominal lymphadenopathy. Other:  No abdominal ascites. Musculoskeletal: No focal osseous lesion. IMPRESSION: 1.1 cm hemorrhagic cyst in the medial left upper kidney, corresponding to the CT abnormality, benign (Bosniak II). Additional 10 mm cyst with layering hemorrhage in the posterior left lower kidney, benign (Bosniak II). Additional simple bilateral renal cysts measuring up to 4.2 cm in the right lower kidney, benign (Bosniak I). Soft tissue prominence involving  the pancreatic head, but without differential enhancement, ductal dilatation, or atrophy of the pancreatic body/ tail. This appearance favors a variant pancreatic anatomy rather than a true pancreatic mass. Electronically Signed   By: Julian Hy M.D.   On: 12/06/2016 12:54    Assessment & Plan:   Problem List Items Addressed This Visit    Age-related osteoporosis with current pathol fracture of vertebra ([place])    With recent and prior vertebral fractures and prior use of alendronate and boniva.  She is tolerating weekly aledronate weekly  Lab Results  Component Value Date   CREATININE 0.47 12/14/2016         Back pain    secondary to history of compression fractures.now resolved for the most part,  Using prn tylenol       Diabetes mellitus without complication ([place])    Diagnosed with fasting glucose of 125 and a1c 6.4 .  Improved with low gi diet.  no medications needed   Lab Results  Component Value Date   HGBA1C 6.1 11/10/2016   Lab Results  Component Value Date   MICROALBUR <0.7 01/28/2016         Elevated liver enzymes    Coincided with statin therapy initiation , now resolved with suspension of medication .  Will dc statins and list as allergy      Relevant Orders   Comprehensive metabolic panel (Completed)   Hypercholesterolemia - Primary    Untreated due to liver enzyme elevation      Relevant Orders   Lipid panel (Completed)   Left renal mass    Renal MRI done for further evaluation,  several hemorrhagic cysts noted       RESOLVED: Panlobular emphysema (HCC)   Relevant Orders   Nocturnal polysomnography (NPSG)   Stasis, venous    Secondary to liver enzyme elevation       Statin intolerance    Other Visit Diagnoses    Snoring       Relevant Orders   Nocturnal polysomnography (NPSG)   Chronic respiratory failure with hypoxia (HCC)       Relevant Orders   Nocturnal polysomnography (NPSG)   Encounter for immunization       Relevant Orders     Flu vaccine HIGH DOSE PF (Completed)    A total of 40 minutes was spent with patient more than half of which was spent in counseling patient on the above mentioned issues , reviewing and explaining recent labs and imaging studies done, and coordination of care.   I am having Ms. Mawhinney maintain her MULTIPLE VITAMIN PO, calcium carbonate, valACYclovir, albuterol, diphenhydrAMINE, acetaminophen, benzocaine, lidocaine, alendronate, cetirizine, losartan, and ALPRAZolam.  No orders of  the defined types were placed in this encounter.   There are no discontinued medications.  Follow-up: Return in about 6 months (around 06/13/2017).   [name], MD

## 2016-12-14 NOTE — Patient Instructions (Addendum)
You received the influenza vaccine today for the coming flu season  I will order the overnight sleep study to see If you need to use oxygen at night  I also recommend The ShingRx vaccine which  is now available in local pharmacies and is much more protective thant Zostavax,  It is therefore ADVISED for all interested adults over 50 to prevent shingles

## 2016-12-15 ENCOUNTER — Encounter: Payer: Self-pay | Admitting: Internal Medicine

## 2016-12-16 ENCOUNTER — Other Ambulatory Visit: Payer: Self-pay | Admitting: Internal Medicine

## 2016-12-16 DIAGNOSIS — K219 Gastro-esophageal reflux disease without esophagitis: Secondary | ICD-10-CM

## 2016-12-17 DIAGNOSIS — Z789 Other specified health status: Secondary | ICD-10-CM | POA: Insufficient documentation

## 2016-12-17 DIAGNOSIS — T466X5A Adverse effect of antihyperlipidemic and antiarteriosclerotic drugs, initial encounter: Secondary | ICD-10-CM | POA: Insufficient documentation

## 2016-12-17 DIAGNOSIS — M791 Myalgia, unspecified site: Secondary | ICD-10-CM | POA: Insufficient documentation

## 2016-12-17 NOTE — Assessment & Plan Note (Signed)
Renal MRI done for further evaluation,  several hemorrhagic cysts noted

## 2016-12-17 NOTE — Assessment & Plan Note (Signed)
Secondary to liver enzyme elevation

## 2016-12-17 NOTE — Assessment & Plan Note (Signed)
secondary to history of compression fractures.now resolved for the most part,  Using prn tylenol

## 2016-12-17 NOTE — Assessment & Plan Note (Signed)
Diagnosed with fasting glucose of 125 and a1c 6.4 .  Improved with low gi diet.  no medications needed   Lab Results  Component Value Date   HGBA1C 6.1 11/10/2016   Lab Results  Component Value Date   MICROALBUR <0.7 01/28/2016

## 2016-12-17 NOTE — Assessment & Plan Note (Signed)
With recent and prior vertebral fractures and prior use of alendronate and boniva.  She is tolerating weekly aledronate weekly  Lab Results  Component Value Date   CREATININE 0.47 12/14/2016

## 2016-12-17 NOTE — Assessment & Plan Note (Signed)
She has been weaned off ambulatory oxygen but needs an overnight oximetry vs sleep study to rule out nocturnal hypoxia

## 2016-12-17 NOTE — Assessment & Plan Note (Signed)
Coincided with statin therapy initiation , now resolved with suspension of medication .  Will dc statins and list as allergy

## 2016-12-17 NOTE — Assessment & Plan Note (Signed)
Untreated due to liver enzyme elevation

## 2017-01-11 ENCOUNTER — Encounter: Payer: Self-pay | Admitting: Emergency Medicine

## 2017-01-11 ENCOUNTER — Other Ambulatory Visit: Payer: Self-pay | Admitting: Internal Medicine

## 2017-01-11 ENCOUNTER — Inpatient Hospital Stay
Admission: EM | Admit: 2017-01-11 | Discharge: 2017-01-18 | DRG: 286 | Disposition: A | Discharge status: Home / Self Care | Payer: Medicare Other | Attending: Internal Medicine | Admitting: Internal Medicine

## 2017-01-11 ENCOUNTER — Ambulatory Visit (INDEPENDENT_AMBULATORY_CARE_PROVIDER_SITE_OTHER): Payer: Medicare Other

## 2017-01-11 ENCOUNTER — Ambulatory Visit (INDEPENDENT_AMBULATORY_CARE_PROVIDER_SITE_OTHER): Payer: Medicare Other | Admitting: Family

## 2017-01-11 ENCOUNTER — Encounter: Payer: Self-pay | Admitting: Family

## 2017-01-11 ENCOUNTER — Emergency Department: Payer: Medicare Other

## 2017-01-11 VITALS — BP 132/68 | HR 85 | Temp 98.2°F | Ht 63.5 in | Wt 126.6 lb

## 2017-01-11 DIAGNOSIS — G9341 Metabolic encephalopathy: Secondary | ICD-10-CM | POA: Diagnosis present

## 2017-01-11 DIAGNOSIS — I081 Rheumatic disorders of both mitral and tricuspid valves: Secondary | ICD-10-CM | POA: Diagnosis present

## 2017-01-11 DIAGNOSIS — I248 Other forms of acute ischemic heart disease: Secondary | ICD-10-CM | POA: Diagnosis present

## 2017-01-11 DIAGNOSIS — J9622 Acute and chronic respiratory failure with hypercapnia: Secondary | ICD-10-CM | POA: Diagnosis present

## 2017-01-11 DIAGNOSIS — R0689 Other abnormalities of breathing: Secondary | ICD-10-CM

## 2017-01-11 DIAGNOSIS — K219 Gastro-esophageal reflux disease without esophagitis: Secondary | ICD-10-CM | POA: Diagnosis present

## 2017-01-11 DIAGNOSIS — Z87891 Personal history of nicotine dependence: Secondary | ICD-10-CM

## 2017-01-11 DIAGNOSIS — J431 Panlobular emphysema: Secondary | ICD-10-CM

## 2017-01-11 DIAGNOSIS — R0602 Shortness of breath: Secondary | ICD-10-CM | POA: Diagnosis not present

## 2017-01-11 DIAGNOSIS — I420 Dilated cardiomyopathy: Secondary | ICD-10-CM | POA: Diagnosis not present

## 2017-01-11 DIAGNOSIS — I10 Essential (primary) hypertension: Secondary | ICD-10-CM | POA: Diagnosis not present

## 2017-01-11 DIAGNOSIS — I110 Hypertensive heart disease with heart failure: Principal | ICD-10-CM | POA: Diagnosis present

## 2017-01-11 DIAGNOSIS — J9621 Acute and chronic respiratory failure with hypoxia: Secondary | ICD-10-CM | POA: Diagnosis present

## 2017-01-11 DIAGNOSIS — I5181 Takotsubo syndrome: Secondary | ICD-10-CM | POA: Diagnosis not present

## 2017-01-11 DIAGNOSIS — I340 Nonrheumatic mitral (valve) insufficiency: Secondary | ICD-10-CM | POA: Diagnosis not present

## 2017-01-11 DIAGNOSIS — R0603 Acute respiratory distress: Secondary | ICD-10-CM

## 2017-01-11 DIAGNOSIS — E785 Hyperlipidemia, unspecified: Secondary | ICD-10-CM | POA: Diagnosis present

## 2017-01-11 DIAGNOSIS — R778 Other specified abnormalities of plasma proteins: Secondary | ICD-10-CM | POA: Diagnosis present

## 2017-01-11 DIAGNOSIS — E1165 Type 2 diabetes mellitus with hyperglycemia: Secondary | ICD-10-CM | POA: Diagnosis present

## 2017-01-11 DIAGNOSIS — I5023 Acute on chronic systolic (congestive) heart failure: Secondary | ICD-10-CM | POA: Diagnosis not present

## 2017-01-11 DIAGNOSIS — J9601 Acute respiratory failure with hypoxia: Secondary | ICD-10-CM | POA: Insufficient documentation

## 2017-01-11 DIAGNOSIS — J189 Pneumonia, unspecified organism: Secondary | ICD-10-CM | POA: Diagnosis present

## 2017-01-11 DIAGNOSIS — J9602 Acute respiratory failure with hypercapnia: Secondary | ICD-10-CM | POA: Diagnosis present

## 2017-01-11 DIAGNOSIS — E039 Hypothyroidism, unspecified: Secondary | ICD-10-CM | POA: Diagnosis present

## 2017-01-11 DIAGNOSIS — I272 Pulmonary hypertension, unspecified: Secondary | ICD-10-CM | POA: Diagnosis present

## 2017-01-11 DIAGNOSIS — Z79899 Other long term (current) drug therapy: Secondary | ICD-10-CM

## 2017-01-11 DIAGNOSIS — J440 Chronic obstructive pulmonary disease with acute lower respiratory infection: Secondary | ICD-10-CM | POA: Diagnosis present

## 2017-01-11 DIAGNOSIS — I5041 Acute combined systolic (congestive) and diastolic (congestive) heart failure: Secondary | ICD-10-CM | POA: Diagnosis not present

## 2017-01-11 DIAGNOSIS — E119 Type 2 diabetes mellitus without complications: Secondary | ICD-10-CM

## 2017-01-11 DIAGNOSIS — I5021 Acute systolic (congestive) heart failure: Secondary | ICD-10-CM | POA: Diagnosis not present

## 2017-01-11 DIAGNOSIS — R7989 Other specified abnormal findings of blood chemistry: Secondary | ICD-10-CM | POA: Diagnosis present

## 2017-01-11 DIAGNOSIS — J441 Chronic obstructive pulmonary disease with (acute) exacerbation: Secondary | ICD-10-CM | POA: Diagnosis present

## 2017-01-11 DIAGNOSIS — J9600 Acute respiratory failure, unspecified whether with hypoxia or hypercapnia: Secondary | ICD-10-CM

## 2017-01-11 DIAGNOSIS — A419 Sepsis, unspecified organism: Secondary | ICD-10-CM | POA: Diagnosis not present

## 2017-01-11 DIAGNOSIS — R748 Abnormal levels of other serum enzymes: Secondary | ICD-10-CM | POA: Diagnosis not present

## 2017-01-11 DIAGNOSIS — R0600 Dyspnea, unspecified: Secondary | ICD-10-CM | POA: Diagnosis not present

## 2017-01-11 DIAGNOSIS — R05 Cough: Secondary | ICD-10-CM | POA: Diagnosis not present

## 2017-01-11 LAB — BASIC METABOLIC PANEL
Anion gap: 12 (ref 5–15)
BUN: 14 mg/dL (ref 6–20)
CHLORIDE: 97 mmol/L — AB (ref 101–111)
CO2: 23 mmol/L (ref 22–32)
CREATININE: 0.87 mg/dL (ref 0.44–1.00)
Calcium: 9.3 mg/dL (ref 8.9–10.3)
GFR calc non Af Amer: >60 mL/min (ref >60)
GLUCOSE: 388 mg/dL — AB (ref 65–99)
Potassium: 4.4 mmol/L (ref 3.5–5.1)
Sodium: 132 mmol/L — ABNORMAL LOW (ref 135–145)

## 2017-01-11 LAB — CBC WITH DIFFERENTIAL/PLATELET
Basophils Absolute: 0.2 10*3/uL — ABNORMAL HIGH (ref 0–0.1)
Basophils Relative: 1 %
Eosinophils Absolute: 1.1 10*3/uL — ABNORMAL HIGH (ref 0–0.7)
Eosinophils Relative: 8 %
HCT: 48.1 % — ABNORMAL HIGH (ref 35.0–47.0)
HEMOGLOBIN: 16 g/dL (ref 12.0–16.0)
LYMPHS ABS: 6.4 10*3/uL — AB (ref 1.0–3.6)
LYMPHS PCT: 48 %
MCH: 32 pg (ref 26.0–34.0)
MCHC: 33.2 g/dL (ref 32.0–36.0)
MCV: 96.3 fL (ref 80.0–100.0)
MONOS PCT: 5 %
Monocytes Absolute: 0.7 10*3/uL (ref 0.2–0.9)
NEUTROS PCT: 38 %
Neutro Abs: 5.2 10*3/uL (ref 1.4–6.5)
Platelets: 327 10*3/uL (ref 150–440)
RBC: 4.99 MIL/uL (ref 3.80–5.20)
RDW: 14.4 % (ref 11.5–14.5)
WBC: 13.6 10*3/uL — AB (ref 3.6–11.0)

## 2017-01-11 LAB — LACTIC ACID, PLASMA: Lactic Acid, Venous: 3 mmol/L (ref 0.5–1.9)

## 2017-01-11 LAB — BLOOD GAS, ARTERIAL
ACID-BASE DEFICIT: 8.8 mmol/L — AB (ref 0.0–2.0)
BICARBONATE: 19.2 mmol/L — AB (ref 20.0–28.0)
Delivery systems: POSITIVE
EXPIRATORY PAP: 6
FIO2: 0.8
INSPIRATORY PAP: 18
LHR: 28 {breaths}/min
MODE: POSITIVE
Mechanical Rate: 8
O2 SAT: 99 %
PH ART: 7.21 — AB (ref 7.350–7.450)
Patient temperature: 37
pCO2 arterial: 48 mmHg (ref 32.0–48.0)
pO2, Arterial: 155 mmHg — ABNORMAL HIGH (ref 83.0–108.0)

## 2017-01-11 LAB — TROPONIN I: Troponin I: 0.1 ng/mL (ref <0.03)

## 2017-01-11 MED ORDER — SODIUM CHLORIDE 0.9 % IV BOLUS (SEPSIS)
1000.0000 mL | Freq: Once | INTRAVENOUS | Status: AC
  Administered 2017-01-11: 1000 mL via INTRAVENOUS

## 2017-01-11 MED ORDER — INSULIN ASPART 100 UNIT/ML ~~LOC~~ SOLN
0.0000 [IU] | Freq: Every day | SUBCUTANEOUS | Status: DC
  Administered 2017-01-12: 3 [IU] via SUBCUTANEOUS
  Filled 2017-01-11: qty 1

## 2017-01-11 MED ORDER — ESCITALOPRAM OXALATE 10 MG PO TABS
10.0000 mg | ORAL_TABLET | Freq: Every day | ORAL | Status: DC
Start: 2017-01-12 — End: 2017-01-18
  Administered 2017-01-12 – 2017-01-18 (×6): 10 mg via ORAL
  Filled 2017-01-11 (×7): qty 1

## 2017-01-11 MED ORDER — ENOXAPARIN SODIUM 40 MG/0.4ML ~~LOC~~ SOLN
40.0000 mg | SUBCUTANEOUS | Status: DC
  Administered 2017-01-12 – 2017-01-17 (×5): 40 mg via SUBCUTANEOUS
  Filled 2017-01-11 (×6): qty 0.4

## 2017-01-11 MED ORDER — MAGNESIUM SULFATE 2 GM/50ML IV SOLN
INTRAVENOUS | Status: AC
  Administered 2017-01-11: 2 g via INTRAVENOUS
  Filled 2017-01-11: qty 50

## 2017-01-11 MED ORDER — ALPRAZOLAM 0.25 MG PO TABS
0.2500 mg | ORAL_TABLET | Freq: Two times a day (BID) | ORAL | Status: DC | PRN
  Filled 2017-01-11: qty 1

## 2017-01-11 MED ORDER — ONDANSETRON HCL 4 MG PO TABS: 4.0000 mg | ORAL_TABLET | Freq: Four times a day (QID) | ORAL | Status: DC | PRN

## 2017-01-11 MED ORDER — CEFTRIAXONE SODIUM IN DEXTROSE 20 MG/ML IV SOLN: 1.0000 g | Freq: Once | INTRAVENOUS | Status: DC

## 2017-01-11 MED ORDER — PREDNISONE 10 MG PO TABS: ORAL_TABLET | ORAL | 0 refills | Status: DC

## 2017-01-11 MED ORDER — IPRATROPIUM-ALBUTEROL 0.5-2.5 (3) MG/3ML IN SOLN
3.0000 mL | RESPIRATORY_TRACT | Status: DC | PRN
Start: 2017-01-11 — End: 2017-01-18
  Administered 2017-01-16: 3 mL via RESPIRATORY_TRACT
  Filled 2017-01-11: qty 3

## 2017-01-11 MED ORDER — ONDANSETRON HCL 4 MG/2ML IJ SOLN
4.0000 mg | Freq: Once | INTRAMUSCULAR | Status: AC
  Administered 2017-01-11: 4 mg via INTRAVENOUS
  Filled 2017-01-11: qty 2

## 2017-01-11 MED ORDER — ALBUTEROL SULFATE (2.5 MG/3ML) 0.083% IN NEBU
INHALATION_SOLUTION | RESPIRATORY_TRACT | Status: AC
  Administered 2017-01-11: 7.5 mg via RESPIRATORY_TRACT
  Filled 2017-01-11: qty 9

## 2017-01-11 MED ORDER — MAGNESIUM SULFATE 2 GM/50ML IV SOLN
2.0000 g | INTRAVENOUS | Status: AC
  Administered 2017-01-11: 2 g via INTRAVENOUS

## 2017-01-11 MED ORDER — DEXTROSE 5 % IV SOLN
500.0000 mg | INTRAVENOUS | Status: DC
  Administered 2017-01-12 – 2017-01-15 (×4): 500 mg via INTRAVENOUS
  Filled 2017-01-11 (×6): qty 500

## 2017-01-11 MED ORDER — METHYLPREDNISOLONE SODIUM SUCC 125 MG IJ SOLR
60.0000 mg | Freq: Four times a day (QID) | INTRAMUSCULAR | Status: DC
  Administered 2017-01-12 (×2): 60 mg via INTRAVENOUS
  Filled 2017-01-11 (×2): qty 2

## 2017-01-11 MED ORDER — VALACYCLOVIR HCL 500 MG PO TABS
1000.0000 mg | ORAL_TABLET | Freq: Two times a day (BID) | ORAL | Status: DC
  Administered 2017-01-12: 1000 mg via ORAL
  Filled 2017-01-11 (×15): qty 2

## 2017-01-11 MED ORDER — DEXTROSE 5 % IV SOLN
500.0000 mg | Freq: Once | INTRAVENOUS | Status: AC
  Administered 2017-01-11: 500 mg via INTRAVENOUS
  Filled 2017-01-11: qty 500

## 2017-01-11 MED ORDER — PANTOPRAZOLE SODIUM 40 MG PO TBEC
40.0000 mg | DELAYED_RELEASE_TABLET | Freq: Every day | ORAL | Status: DC
  Administered 2017-01-12 – 2017-01-18 (×6): 40 mg via ORAL
  Filled 2017-01-11 (×7): qty 1

## 2017-01-11 MED ORDER — ONDANSETRON HCL 4 MG/2ML IJ SOLN: 4.0000 mg | Freq: Four times a day (QID) | INTRAMUSCULAR | Status: DC | PRN

## 2017-01-11 MED ORDER — ACETAMINOPHEN 325 MG PO TABS
650.0000 mg | ORAL_TABLET | Freq: Four times a day (QID) | ORAL | Status: DC | PRN
  Administered 2017-01-12 – 2017-01-18 (×4): 650 mg via ORAL
  Filled 2017-01-11 (×4): qty 2

## 2017-01-11 MED ORDER — IPRATROPIUM-ALBUTEROL 0.5-2.5 (3) MG/3ML IN SOLN: 3.0000 mL | Freq: Three times a day (TID) | RESPIRATORY_TRACT | 1 refills | Status: DC

## 2017-01-11 MED ORDER — DEXTROSE 5 % IV SOLN
1.0000 g | INTRAVENOUS | Status: DC
  Administered 2017-01-11 – 2017-01-13 (×3): 1 g via INTRAVENOUS
  Filled 2017-01-11 (×2): qty 10

## 2017-01-11 MED ORDER — ALBUTEROL SULFATE (2.5 MG/3ML) 0.083% IN NEBU
7.5000 mg | INHALATION_SOLUTION | Freq: Once | RESPIRATORY_TRACT | Status: AC
  Administered 2017-01-11: 7.5 mg via RESPIRATORY_TRACT

## 2017-01-11 MED ORDER — NOREPINEPHRINE BITARTRATE 1 MG/ML IV SOLN
1.0000 ug/min | INTRAVENOUS | Status: DC
  Administered 2017-01-11: 1 ug/min via INTRAVENOUS
  Filled 2017-01-11: qty 4

## 2017-01-11 MED ORDER — INSULIN ASPART 100 UNIT/ML ~~LOC~~ SOLN
0.0000 [IU] | Freq: Three times a day (TID) | SUBCUTANEOUS | Status: DC
  Administered 2017-01-12: 3 [IU] via SUBCUTANEOUS
  Filled 2017-01-11: qty 1

## 2017-01-11 MED ORDER — LEVOFLOXACIN 750 MG PO TABS: 750.0000 mg | ORAL_TABLET | Freq: Every day | ORAL | 0 refills | Status: AC

## 2017-01-11 MED ORDER — ACETAMINOPHEN 650 MG RE SUPP: 650.0000 mg | Freq: Four times a day (QID) | RECTAL | Status: DC | PRN

## 2017-01-11 NOTE — Assessment & Plan Note (Addendum)
Afebrile. Well appearing. Not labored in speech. Sa02 improved after nebulizer which was reassuring. Pending cxr. Suspect COPD exacerbation and based on guidelines, will treat with levaquin. Have also given short prednisone taper and ordered nebulizer with DuoDeb since patient responded so well to nebulizer. Close vigilance and advised one week f/u, sooner if no improvement.

## 2017-01-11 NOTE — ED Notes (Signed)
Patient now opening eyes all the way, tracking people in the room, and responding to her name.

## 2017-01-11 NOTE — Progress Notes (Signed)
Pre visit review using our clinic review tool, if applicable. No additional management support is needed unless otherwise documented below in the visit note. 

## 2017-01-11 NOTE — ED Notes (Signed)
ED Provider at bedside. 

## 2017-01-11 NOTE — Consult Note (Signed)
Name: Mary Howe MRN: [id number] DOB: 1932/08/27    ADMISSION DATE:  01/11/2017 CONSULTATION DATE: 01/11/2017  REFERRING MD : Dr. Jannifer Franklin  CHIEF COMPLAINT: Respiratory Distress  BRIEF PATIENT DESCRIPTION:  83 yo female admitted 10/3 with acute on chronic hypoxic hypercapnic respiratory failure secondary to atypical pulmonary infection vs. pulmonary interstitial edema and AECOPD requiring continuous Bipap, acute encephalopathy likely secondary to hypercapnia, and sepsis with hypotension requiring levophed gtt     SIGNIFICANT EVENTS  10/3-Pt admitted to stepdown unit   STUDIES:  None   HISTORY OF PRESENT ILLNESS:   This is an 83 yo female with PMH Hypothyroidism, Hyperlipidemia, GERD, Diverticulosis, and COPD.  She presented to [facility] ER 10/3 via EMS from home with respiratory distress.  She saw her PCP the morning of 10/3 with c/o cough x1 week with shortness of breath and wheezes, therefore she received nebs and prednisone with improvement of symptoms during office visit and returned home.  However, due to worsening symptoms later in the day EMS was notified, upon their arrival she was given 125 mg iv solumedrol, albuterol and duoneb treatments x2, and epi en route to the ER.  In the ER O2 sats were 94% on NRB, however due to worsening respiratory failure she was placed on continuous Bipap. She also complained of back and shoulder pain initial troponin 0.10 and ekg revealed sinus tach with incomplete right bbb, no acute ischemic changes present. She was subsequently admitted to the stepdown unit by hospitalist team for further workup and treatment PCCM consulted.  PAST MEDICAL HISTORY :   has a past medical history of COPD (chronic obstructive pulmonary disease) ([place]); Diverticulitis (2015); GERD (gastroesophageal reflux disease); Hyperlipidemia; and Thyroid disease.  has a past surgical history that includes Cholecystectomy; Hemorroidectomy; and Cervical cone biopsy. Prior to Admission  medications   Medication Sig Start Date End Date Taking? Authorizing Provider  acetaminophen (TYLENOL) 500 MG tablet Take 500 mg by mouth every 6 (six) hours as needed.   Yes [provider]  albuterol (PROVENTIL HFA;VENTOLIN HFA) 108 (90 Base) MCG/ACT inhaler Inhale 1-2 puffs into the lungs every 4 (four) hours as needed for wheezing or shortness of breath. 04/20/16  Yes Wilhelmina Mcardle, MD  alendronate (FOSAMAX) 70 MG tablet Take 1 tablet (70 mg total) by mouth every 7 (seven) days. Take with a full glass of water on an empty stomach. 06/22/16  Yes [name], MD  ALPRAZolam Duanne Moron) 0.25 MG tablet Take 1 tablet (0.25 mg total) by mouth 2 (two) times daily as needed for anxiety. 11/24/16  Yes [name], MD  benzocaine (ORAJEL) 10 % mucosal gel Use as directed in the mouth or throat 3 (three) times daily as needed for mouth pain. 06/12/16  Yes Epifanio Lesches, MD  calcium carbonate (OS-CAL) 600 MG TABS tablet Take 600 mg by mouth daily with breakfast.  02/26/13  Yes [provider]  cetirizine (ZYRTEC) 10 MG tablet Take 10 mg by mouth daily.   Yes [provider]  diphenhydrAMINE (BENADRYL) 25 MG tablet Take 25 mg by mouth at bedtime as needed.   Yes [provider]  escitalopram (LEXAPRO) 10 MG tablet TAKE 1 TABLET(10 MG) BY MOUTH DAILY 12/16/16  Yes [name], MD  ipratropium-albuterol (DUONEB) 0.5-2.5 (3) MG/3ML SOLN Take 3 mLs by nebulization every 8 (eight) hours. 01/11/17  Yes Burnard Hawthorne, FNP  levofloxacin (LEVAQUIN) 750 MG tablet Take 1 tablet (750 mg total) by mouth daily. 01/11/17 01/16/17 Yes Mable Paris  G, FNP  lidocaine (LIDODERM) 5 % Place 1 patch onto the skin daily. Remove & Discard patch within 12 hours or as directed by MD 06/12/16  Yes Epifanio Lesches, MD  losartan (COZAAR) 50 MG tablet Take 2 tablets (100 mg total) by mouth daily. Patient taking differently: Take 50 mg by mouth daily.  11/11/16  Yes [name], MD    MULTIPLE VITAMIN PO Take 1 tablet by mouth daily.  02/26/13  Yes [provider]  pantoprazole (PROTONIX) 40 MG tablet TAKE 1 TABLET(40 MG) BY MOUTH DAILY 12/16/16  Yes [name], MD  predniSONE (DELTASONE) 10 MG tablet Take 40 mg by mouth on day 1, then taper 10 mg daily until gone 01/11/17  Yes Arnett, Yvetta Coder, FNP  valACYclovir (VALTREX) 1000 MG tablet Take 1,000 mg by mouth 2 (two) times daily.  12/08/15  Yes [provider]   Allergies  Allergen Reactions  . Simvastatin     Liver enzyme elevation    FAMILY HISTORY:  family history includes Aneurysm in her sister; Bone cancer in her sister; Brain cancer in her brother; CAD in her brother and brother; Emphysema in her sister; Fibrocystic breast disease in her sister; Heart attack in her brother; Heart disease in her brother and sister; Lung disease in her brother; Pneumonia in her brother; Prostate cancer in her father. SOCIAL HISTORY:  reports that she has quit smoking. She has never used smokeless tobacco. She reports that she drinks alcohol. She reports that she does not use drugs.  REVIEW OF SYSTEMS:   Unable to assess pt on Bipap  SUBJECTIVE:  Unable to assess pt on Bipap  VITAL SIGNS: Temp:  [98.2 F (36.8 C)] 98.2 F (36.8 C) (10/03 1100) Pulse Rate:  [66-144] 66 (10/03 2245) Resp:  [19-29] 22 (10/03 2245) BP: (80-159)/(56-110) 81/56 (10/03 2245) SpO2:  [91 %-100 %] 100 % (10/03 2245) Weight:  [57.2 kg (126 lb)-57.4 kg (126 lb 9.6 oz)] 57.2 kg (126 lb) (10/03 2034)  PHYSICAL EXAMINATION: General: elderly Caucasian female, NAD on Bipap Neuro: alert and oriented, follows commands, PERRLA HEENT: supple, no JVD Cardiovascular: s1s2, rrr, no M/R/G Lungs: diffuse inspiratory and expiratory wheezes throughout, even, non labored Abdomen: +BS x4, soft, mildly tender, non distended  Musculoskeletal: normal bulk and tone, no edema Skin: intact no rashes or lesions   Recent Labs Lab 01/11/17 2030   NA 132*  K 4.4  CL 97*  CO2 23  BUN 14  CREATININE 0.87  GLUCOSE 388*    Recent Labs Lab 01/11/17 2030  HGB 16.0  HCT 48.1*  WBC 13.6*  PLT 327   Dg Chest 2 View  Result Date: 01/11/2017 CLINICAL DATA:  Cough.  History of COPD. EXAM: CHEST  2 VIEW COMPARISON:  None. FINDINGS: The cardiac silhouette is mildly enlarged. No mediastinal or hilar masses. No evidence of adenopathy. Lungs are hyperexpanded. Prominent bronchovascular markings are noted in the lung bases most evident in the lower lobes. There is a paucity of vascular markings in the upper lobes consistent with emphysema. No evidence of pneumonia or pulmonary edema. No pleural effusion or pneumothorax. Skeletal structures are demineralized. There are mild compression fractures of four upper to midthoracic vertebra, likely old. IMPRESSION: 1. No acute cardiopulmonary disease. 2. COPD/emphysema. 3. Mild cardiomegaly. 4. Mild thoracic vertebral compression fractures, of unclear chronicity, but likely old if there is no current back pain. Electronically Signed   By: Lajean Manes M.D.   On: 01/11/2017 15:05   Dg  Chest Portable 1 View  Result Date: 01/11/2017 CLINICAL DATA:  Initial evaluation for acute respiratory distress. EXAM: PORTABLE CHEST 1 VIEW COMPARISON:  Prior radiograph from 06/10/2016. FINDINGS: Cardiomegaly.  Mediastinal silhouette normal. Lungs hyperinflated with underlying emphysematous changes. Superimposed bibasilar interstitial prominence with scattered Kerley B-lines, suggesting mild pulmonary interstitial edema. Superimposed infection not excluded. Blunting of the right costophrenic angle, suggestive of small effusion. No pneumothorax. No acute osseus abnormality. IMPRESSION: 1. Bibasilar interstitial congestion/prominence, suggesting mild pulmonary interstitial edema. Superimposed infection not excluded, and could be considered in the correct clinical setting. 2. Blunting of the right costophrenic angle, suggestive of  small effusion. 3. Underlying emphysema. 4. Cardiomegaly. Electronically Signed   By: Jeannine Boga M.D.   On: 01/11/2017 21:02    ASSESSMENT / PLAN: Acute on chronic hypoxic hypercapnic respiratory failure secondary to atypical pulmonary infection vs. pulmonary interstitial edema and AECOPD Acute encephalopathy likely secondary to hypercapnia  Septic sock with hypotension  Lactic acidosis  Elevated troponin secondary to demand ischemia vs. NSTEMI  Hx: Diabetes Mellitus, GERD, and Hypothyroidism  P: Prn Bipap for hypoxia and/or dyspnea Maintain O2 sats 88% to 92% Prn CXR and ABG's  Continue scheduled and prn bronchodilator therapy  Continue iv steroids will add nebulized steroids  Trend WBC and monitor fever curve Trend PCT and lactic acid  Follow cultures Continue current abx  Levophed gtt to maintain map >65 Continuous telemetry monitoring  Trend troponin's  Lovenox for VTE prophylaxis Monitor for s/sx of bleeding Transfuse for hgb <7 Avoid sedating medications  SSI   Marda Stalker, [place] Pager [phone] (please enter 7 digits) PCCM Consult Pager [phone] (please enter 7 digits)

## 2017-01-11 NOTE — H&P (Signed)
[facility] [place] NAME: Mary Howe    MR#:  [id number]  DATE OF BIRTH:  05-01-1932  DATE OF ADMISSION:  01/11/2017  PRIMARY CARE PHYSICIAN: [name], MD   REQUESTING/REFERRING PHYSICIAN: Joni Fears, MD  CHIEF COMPLAINT:   Chief Complaint  Patient presents with  . Respiratory Distress    HISTORY OF PRESENT ILLNESS:  Mary Howe  is a 83 y.o. female who presents with Altered mental status due to hypercarbic respiratory failure. Patient states that she had back pain and shoulder pain at home today, then shortly thereafter began wheezing and then decompensated despite using home nebulizers. She began confused and less responsive and was brought to the ED by EMS. She was placed on BiPAP and after a short while became responsive again. Initial venous blood gas showed significantly decreased pH with elevated CO2. She had a positive troponin in the ED with no prior history of cardiac disease, as well as an elevated lactic acid. Hospitalists were called for admission  PAST MEDICAL HISTORY:   Past Medical History:  Diagnosis Date  . COPD (chronic obstructive pulmonary disease) ([place])   . Diverticulitis 2015  . GERD (gastroesophageal reflux disease)   . Hyperlipidemia   . Thyroid disease     PAST SURGICAL HISTORY:   Past Surgical History:  Procedure Laterality Date  . CERVICAL CONE BIOPSY    . CHOLECYSTECTOMY    . HEMORROIDECTOMY      SOCIAL HISTORY:   Social History  Substance Use Topics  . Smoking status: Former Research scientist (life sciences)  . Smokeless tobacco: Never Used     Comment: quit smoking in 1990  . Alcohol use 0.0 oz/week     Comment: OCCASIONALLY DRINKS WINE    FAMILY HISTORY:   Family History  Problem Relation Age of Onset  . Prostate cancer Father   . Pneumonia Brother   . Lung disease Brother   . CAD Brother   . Heart disease Brother   . CAD Brother   . Heart attack Brother   . Brain cancer Brother   . Bone  cancer Sister   . Emphysema Sister   . Fibrocystic breast disease Sister   . Aneurysm Sister   . Heart disease Sister     DRUG ALLERGIES:   Allergies  Allergen Reactions  . Simvastatin     Liver enzyme elevation    MEDICATIONS AT HOME:   Prior to Admission medications   Medication Sig Start Date End Date Taking? Authorizing Provider  acetaminophen (TYLENOL) 500 MG tablet Take 500 mg by mouth every 6 (six) hours as needed.   Yes [provider]  albuterol (PROVENTIL HFA;VENTOLIN HFA) 108 (90 Base) MCG/ACT inhaler Inhale 1-2 puffs into the lungs every 4 (four) hours as needed for wheezing or shortness of breath. 04/20/16  Yes Wilhelmina Mcardle, MD  alendronate (FOSAMAX) 70 MG tablet Take 1 tablet (70 mg total) by mouth every 7 (seven) days. Take with a full glass of water on an empty stomach. 06/22/16  Yes [name], MD  ALPRAZolam Duanne Moron) 0.25 MG tablet Take 1 tablet (0.25 mg total) by mouth 2 (two) times daily as needed for anxiety. 11/24/16  Yes [name], MD  benzocaine (ORAJEL) 10 % mucosal gel Use as directed in the mouth or throat 3 (three) times daily as needed for mouth pain. 06/12/16  Yes Epifanio Lesches, MD  calcium carbonate (OS-CAL) 600 MG TABS tablet Take 600 mg by mouth  daily with breakfast.  02/26/13  Yes [provider]  cetirizine (ZYRTEC) 10 MG tablet Take 10 mg by mouth daily.   Yes [provider]  diphenhydrAMINE (BENADRYL) 25 MG tablet Take 25 mg by mouth at bedtime as needed.   Yes [provider]  escitalopram (LEXAPRO) 10 MG tablet TAKE 1 TABLET(10 MG) BY MOUTH DAILY 12/16/16  Yes [name], MD  ipratropium-albuterol (DUONEB) 0.5-2.5 (3) MG/3ML SOLN Take 3 mLs by nebulization every 8 (eight) hours. 01/11/17  Yes Burnard Hawthorne, FNP  levofloxacin (LEVAQUIN) 750 MG tablet Take 1 tablet (750 mg total) by mouth daily. 01/11/17 01/16/17 Yes Arnett, Yvetta Coder, FNP  lidocaine (LIDODERM) 5 % Place 1 patch onto the skin  daily. Remove & Discard patch within 12 hours or as directed by MD 06/12/16  Yes Epifanio Lesches, MD  losartan (COZAAR) 50 MG tablet Take 2 tablets (100 mg total) by mouth daily. Patient taking differently: Take 50 mg by mouth daily.  11/11/16  Yes [name], MD  MULTIPLE VITAMIN PO Take 1 tablet by mouth daily.  02/26/13  Yes [provider]  pantoprazole (PROTONIX) 40 MG tablet TAKE 1 TABLET(40 MG) BY MOUTH DAILY 12/16/16  Yes [name], MD  predniSONE (DELTASONE) 10 MG tablet Take 40 mg by mouth on day 1, then taper 10 mg daily until gone 01/11/17  Yes Arnett, Yvetta Coder, FNP  valACYclovir (VALTREX) 1000 MG tablet Take 1,000 mg by mouth 2 (two) times daily.  12/08/15  Yes [provider]    REVIEW OF SYSTEMS:  Review of Systems  Constitutional: Negative for chills, fever, malaise/fatigue and weight loss.  HENT: Negative for ear pain, hearing loss and tinnitus.   Eyes: Negative for blurred vision, double vision, pain and redness.  Respiratory: Positive for shortness of breath. Negative for cough and hemoptysis.   Cardiovascular: Negative for chest pain, palpitations, orthopnea and leg swelling.  Gastrointestinal: Negative for abdominal pain, constipation, diarrhea, nausea and vomiting.  Genitourinary: Negative for dysuria, frequency and hematuria.  Musculoskeletal: Positive for back pain. Negative for joint pain and neck pain.  Skin:       No acne, rash, or lesions  Neurological: Negative for dizziness, tremors, focal weakness and weakness.  Endo/Heme/Allergies: Negative for polydipsia. Does not bruise/bleed easily.  Psychiatric/Behavioral: Negative for depression. The patient is not nervous/anxious and does not have insomnia.      VITAL SIGNS:   Vitals:   01/11/17 2115 01/11/17 2130 01/11/17 2145 01/11/17 2200  BP: 93/63 (!) 85/59 (!) 91/57 (!) 93/59  Pulse: (!) 113 (!) 108 (!) 102 (!) 107  Resp: 20 (!) 24 20 19   SpO2: 96% 99% 100% 100%  Weight:       Height:       Wt Readings from Last 3 Encounters:  01/11/17 57.2 kg (126 lb)  01/11/17 57.4 kg (126 lb 9.6 oz)  12/14/16 58.2 kg (128 lb 6.4 oz)    PHYSICAL EXAMINATION:  Physical Exam  Vitals reviewed. Constitutional: She is oriented to person, place, and time. She appears well-developed and well-nourished. No distress.  HENT:  Head: Normocephalic and atraumatic.  Mouth/Throat: Oropharynx is clear and moist.  Eyes: Pupils are equal, round, and reactive to light. Conjunctivae and EOM are normal. No scleral icterus.  Neck: Normal range of motion. Neck supple. No JVD present. No thyromegaly present.  Cardiovascular: Regular rhythm and intact distal pulses.  Exam reveals no gallop and no friction rub.   No murmur heard. Tachycardic  Respiratory: She is in respiratory distress. She has no wheezes. She has rales.  GI: Soft. Bowel sounds are normal. She exhibits no distension. There is no tenderness.  Musculoskeletal: Normal range of motion. She exhibits no edema.  No arthritis, no gout  Lymphadenopathy:    She has no cervical adenopathy.  Neurological: She is alert and oriented to person, place, and time. No cranial nerve deficit.  No dysarthria, no aphasia  Skin: Skin is warm and dry. No rash noted. No erythema.  Psychiatric: She has a normal mood and affect. Her behavior is normal. Judgment and thought content normal.    LABORATORY PANEL:   CBC  Recent Labs Lab 01/11/17 2030  WBC 13.6*  HGB 16.0  HCT 48.1*  PLT 327   ------------------------------------------------------------------------------------------------------------------  Chemistries   Recent Labs Lab 01/11/17 2030  NA 132*  K 4.4  CL 97*  CO2 23  GLUCOSE 388*  BUN 14  CREATININE 0.87  CALCIUM 9.3   ------------------------------------------------------------------------------------------------------------------  Cardiac Enzymes  Recent Labs Lab 01/11/17 2030  TROPONINI 0.10*    ------------------------------------------------------------------------------------------------------------------  RADIOLOGY:  Dg Chest 2 View  Result Date: 01/11/2017 CLINICAL DATA:  Cough.  History of COPD. EXAM: CHEST  2 VIEW COMPARISON:  None. FINDINGS: The cardiac silhouette is mildly enlarged. No mediastinal or hilar masses. No evidence of adenopathy. Lungs are hyperexpanded. Prominent bronchovascular markings are noted in the lung bases most evident in the lower lobes. There is a paucity of vascular markings in the upper lobes consistent with emphysema. No evidence of pneumonia or pulmonary edema. No pleural effusion or pneumothorax. Skeletal structures are demineralized. There are mild compression fractures of four upper to midthoracic vertebra, likely old. IMPRESSION: 1. No acute cardiopulmonary disease. 2. COPD/emphysema. 3. Mild cardiomegaly. 4. Mild thoracic vertebral compression fractures, of unclear chronicity, but likely old if there is no current back pain. Electronically Signed   By: Lajean Manes M.D.   On: 01/11/2017 15:05   Dg Chest Portable 1 View  Result Date: 01/11/2017 CLINICAL DATA:  Initial evaluation for acute respiratory distress. EXAM: PORTABLE CHEST 1 VIEW COMPARISON:  Prior radiograph from 06/10/2016. FINDINGS: Cardiomegaly.  Mediastinal silhouette normal. Lungs hyperinflated with underlying emphysematous changes. Superimposed bibasilar interstitial prominence with scattered Kerley B-lines, suggesting mild pulmonary interstitial edema. Superimposed infection not excluded. Blunting of the right costophrenic angle, suggestive of small effusion. No pneumothorax. No acute osseus abnormality. IMPRESSION: 1. Bibasilar interstitial congestion/prominence, suggesting mild pulmonary interstitial edema. Superimposed infection not excluded, and could be considered in the correct clinical setting. 2. Blunting of the right costophrenic angle, suggestive of small effusion. 3. Underlying  emphysema. 4. Cardiomegaly. Electronically Signed   By: Jeannine Boga M.D.   On: 01/11/2017 21:02    EKG:   Orders placed or performed during the hospital encounter of 01/11/17  . ED EKG  . ED EKG    IMPRESSION AND PLAN:  Principal Problem:   Acute respiratory failure with hypercapnia (HCC) - Due to COPD exacerbation, with significant hypercapnia. Unclear whether possibly due to atypical pneumonia or potentially related to cardiac event. See below. Patient is on BiPAP and improving significantly, we will continue BiPAP for now Active Problems:   COPD with acute exacerbation (HCC) - IV Solu-Medrol, antibiotics, when necessary nebs   CAP (community acquired pneumonia) - IV antibiotics   Elevated troponin - trend troponin, echocardiogram, would start heparin if her troponin rises any further as she did have possible ACS equivalent symptoms at home.   Essential hypertension - hold home antihypertensives  for now as patient's blood pressure was actually low   Diabetes mellitus without complication (HCC) - sliding scale insulin with corresponding glucose checks  All the records are reviewed and case discussed with ED provider. Management plans discussed with the patient and/or family.  DVT PROPHYLAXIS: SubQ lovenox  GI PROPHYLAXIS: None  ADMISSION STATUS: Inpatient  CODE STATUS: Full Code Status History    Date Active Date Inactive Code Status Order ID Comments User Context   01/12/2016 10:12 AM 01/14/2016 12:46 AM Full Code [id number]  Saundra Shelling, MD Inpatient      TOTAL TIME TAKING CARE OF THIS PATIENT: 45 minutes.   Jannifer Franklin, Thekla Colborn [place] 01/11/2017, 10:15 PM  [employer]  [id number]  CC: Primary care physician; [name], MD  Note:  This document was prepared using Dragon voice recognition software and may include unintentional dictation errors.

## 2017-01-11 NOTE — Patient Instructions (Addendum)
Reschedule sleep study.   Prednisone taper and antibiotics  Use albuterol inhaler  Nebulizer ordered  Ensure to take probiotics while on antibiotics and also for 2 weeks after completion. It is important to re-colonize the gut with good bacteria and also to prevent any diarrheal infections associated with antibiotic use.   Follow up in one week

## 2017-01-11 NOTE — ED Provider Notes (Signed)
[facility] Emergency Department Provider Note  ____________________________________________  Time seen: Approximately 9:39 PM  I have reviewed the triage vital signs and the nursing notes.   HISTORY  Chief Complaint Respiratory Distress  Level 5 Caveat: Portions of the History and Physical were unable to be obtained due to altered mental status.   HPI Mary Howe is a 83 y.o. female brought to the ED by EMS due to respiratory distress. Patient has eyes open but is unresponsive and nonverbal.  reportedly she went to her doctor due to shortness of breath today, was started on nebs and prednisone. She returned home but had rapidly worsening symptoms. She called her daughter and asked to be sent to the hospital.   Past Medical History:  Diagnosis Date  . COPD (chronic obstructive pulmonary disease) ([place])   . Diverticulitis 2015  . GERD (gastroesophageal reflux disease)   . Hyperlipidemia   . Thyroid disease      Patient Active Problem List   Diagnosis Date Noted  . CAP (community acquired pneumonia) 01/11/2017  . Acute respiratory failure with hypoxia ([place]) 01/11/2017  . Statin intolerance 12/17/2016  . Left renal mass 11/25/2016  . S/P cholecystectomy 11/25/2016  . Elevated liver enzymes 11/12/2016  . Diabetes mellitus without complication ([place]) 82/95/6213  . Anxiety, generalized 06/22/2016  . Back pain 06/10/2016  . COPD with acute exacerbation ([place]) 06/10/2016  . Post-nasal drip 05/07/2016  . COPD (chronic obstructive pulmonary disease) ([place]) 02/06/2016  . History of shingles 01/30/2016  . Age-related osteoporosis with current pathol fracture of vertebra ([place]) 01/30/2016  . Essential hypertension 01/30/2016  . History of Graves' disease 01/30/2016  . Hospital discharge follow-up 01/30/2016  . Acute respiratory failure with hypercapnia ([place]) 01/12/2016  . DD (diverticular disease) 02/19/2015  . Acid reflux 02/19/2015  . [place] disease  02/19/2015  . Hypercholesterolemia 02/19/2015  . Neuropathy 02/19/2015  . 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 Stasis, venous 02/19/2015     Past Surgical History:  Procedure Laterality Date  . CERVICAL CONE BIOPSY    . CHOLECYSTECTOMY    . HEMORROIDECTOMY       Prior to Admission medications   Medication Sig Start Date End Date Taking? Authorizing Provider  acetaminophen (TYLENOL) 500 MG tablet Take 500 mg by mouth every 6 (six) hours as needed.   Yes [provider]  albuterol (PROVENTIL HFA;VENTOLIN HFA) 108 (90 Base) MCG/ACT inhaler Inhale 1-2 puffs into the lungs every 4 (four) hours as needed for wheezing or shortness of breath. 04/20/16  Yes Wilhelmina Mcardle, MD  alendronate (FOSAMAX) 70 MG tablet Take 1 tablet (70 mg total) by mouth every 7 (seven) days. Take with a full glass of water on an empty stomach. 06/22/16  Yes [name], MD  ALPRAZolam Duanne Moron) 0.25 MG tablet Take 1 tablet (0.25 mg total) by mouth 2 (two) times daily as needed for anxiety. 11/24/16  Yes [name], MD  benzocaine (ORAJEL) 10 % mucosal gel Use as directed in the mouth or throat 3 (three) times daily as needed for mouth pain. 06/12/16  Yes Epifanio Lesches, MD  calcium carbonate (OS-CAL) 600 MG TABS tablet Take 600 mg by mouth daily with breakfast.  02/26/13  Yes [provider]  cetirizine (ZYRTEC) 10 MG tablet Take 10 mg by mouth daily.   Yes [provider]  diphenhydrAMINE (BENADRYL) 25 MG tablet Take 25 mg by mouth at bedtime as needed.   Yes [provider]  escitalopram (LEXAPRO) 10 MG tablet TAKE 1 TABLET(10 MG) BY MOUTH DAILY 12/16/16  Yes [name], MD  ipratropium-albuterol (DUONEB) 0.5-2.5 (3) MG/3ML SOLN Take 3 mLs by nebulization every 8 (eight) hours. 01/11/17  Yes Burnard Hawthorne, FNP  levofloxacin (LEVAQUIN) 750 MG tablet Take 1 tablet (750 mg total) by mouth daily. 01/11/17 01/16/17 Yes  Arnett, Yvetta Coder, FNP  lidocaine (LIDODERM) 5 % Place 1 patch onto the skin daily. Remove & Discard patch within 12 hours or as directed by MD 06/12/16  Yes Epifanio Lesches, MD  losartan (COZAAR) 50 MG tablet Take 2 tablets (100 mg total) by mouth daily. Patient taking differently: Take 50 mg by mouth daily.  11/11/16  Yes [name], MD  MULTIPLE VITAMIN PO Take 1 tablet by mouth daily.  02/26/13  Yes [provider]  pantoprazole (PROTONIX) 40 MG tablet TAKE 1 TABLET(40 MG) BY MOUTH DAILY 12/16/16  Yes [name], MD  predniSONE (DELTASONE) 10 MG tablet Take 40 mg by mouth on day 1, then taper 10 mg daily until gone 01/11/17  Yes Arnett, Yvetta Coder, FNP  valACYclovir (VALTREX) 1000 MG tablet Take 1,000 mg by mouth 2 (two) times daily.  12/08/15  Yes [provider]     Allergies Simvastatin   Family History  Problem Relation Age of Onset  . Prostate cancer Father   . Pneumonia Brother   . Lung disease Brother   . CAD Brother   . Heart disease Brother   . CAD Brother   . Heart attack Brother   . Brain cancer Brother   . Bone cancer Sister   . Emphysema Sister   . Fibrocystic breast disease Sister   . Aneurysm Sister   . Heart disease Sister     Social History Social History  Substance Use Topics  . Smoking status: Former Research scientist (life sciences)  . Smokeless tobacco: Never Used     Comment: quit smoking in 1990  . Alcohol use 0.0 oz/week     Comment: OCCASIONALLY DRINKS WINE    Review of Systems unable to reliably obtained due to altered mental status. Positive for shortness of breath  ____________________________________________   PHYSICAL EXAM:  VITAL SIGNS: ED Triage Vitals  Enc Vitals Group     BP 01/11/17 2032 (!) 159/110     Pulse Rate 01/11/17 2032 (!) 144     Resp 01/11/17 2032 (!) 28     Temp --      Temp src --      SpO2 01/11/17 2032 96 %     Weight 01/11/17 2034 126 lb (57.2 kg)     Height 01/11/17 2034 5' 3.5" (1.613 m)     Head  Circumference --      Peak Flow --      Pain Score --      Pain Loc --      Pain Edu? --      Excl. in [place]? --     Vital signs reviewed, nursing assessments reviewed.   Constitutional:   awake, not alert or oriented. severe respiratory distress. Eyes:   No scleral icterus.  EOMI. No nystagmus. No conjunctival pallor.pupils equal at 4 mm, not reactive ENT   Head:   Normocephalic and atraumatic.   Nose:   No congestion/rhinnorhea.    Mouth/Throat:   dry mucous membranes, no pharyngeal erythema. No peritonsillar mass.    Neck:   No meningismus. Full ROM Hematological/Lymphatic/Immunilogical:   No cervical lymphadenopathy. Cardiovascular:  tachycardia heart rate 140. Symmetric bilateral radial and DP pulses.  No murmurs.  Respiratory:   tachypnea, increased work of breathing with accessory muscle use. Diffuse expiratory wheezing and prolonged expiratory phase. Diminished air entry in all lung fields.. Gastrointestinal:   Soft and nontender. Non distended. There is no CVA tenderness.  No rebound, rigidity, or guarding. Genitourinary:   deferred Musculoskeletal:   Normal range of motion in all extremities. No joint effusions.  No lower extremity tenderness.  No edema. Neurologic:   not responsive, nonverbal GCS = [id number] = 9 Skin:    Skin is warm, dry and intact. No rash noted.  No petechiae, purpura, or bullae.  ____________________________________________    LABS (pertinent positives/negatives) (all labs ordered are listed, but only abnormal results are displayed) Labs Reviewed  BASIC METABOLIC PANEL - Abnormal; Notable for the following:       Result Value   Sodium 132 (*)    Chloride 97 (*)    Glucose, Bld 388 (*)    All other components within normal limits  TROPONIN I - Abnormal; Notable for the following:    Troponin I 0.10 (*)    All other components within normal limits  CBC WITH DIFFERENTIAL/PLATELET - Abnormal; Notable for the following:    WBC 13.6 (*)     HCT 48.1 (*)    Lymphs Abs 6.4 (*)    Eosinophils Absolute 1.1 (*)    Basophils Absolute 0.2 (*)    All other components within normal limits  LACTIC ACID, PLASMA - Abnormal; Notable for the following:    Lactic Acid, Venous 3.0 (*)    All other components within normal limits  BLOOD GAS, VENOUS - Abnormal; Notable for the following:    pH, Ven 6.95 (*)    pO2, Ven 78.0 (*)    Acid-base deficit 10.5 (*)    All other components within normal limits  CULTURE, BLOOD (ROUTINE X 2)  CULTURE, BLOOD (ROUTINE X 2)  BLOOD GAS, VENOUS  LACTIC ACID, PLASMA  BLOOD GAS, ARTERIAL   ____________________________________________   EKG  interpreted by me Sinus tachycardia, rate of 141, normal axis, first-degree AV block. Incomplete right bundle-branch block. No acute ischemic changes.  ____________________________________________    [id number]  Dg Chest 2 View  Result Date: 01/11/2017 CLINICAL DATA:  Cough.  History of COPD. EXAM: CHEST  2 VIEW COMPARISON:  None. FINDINGS: The cardiac silhouette is mildly enlarged. No mediastinal or hilar masses. No evidence of adenopathy. Lungs are hyperexpanded. Prominent bronchovascular markings are noted in the lung bases most evident in the lower lobes. There is a paucity of vascular markings in the upper lobes consistent with emphysema. No evidence of pneumonia or pulmonary edema. No pleural effusion or pneumothorax. Skeletal structures are demineralized. There are mild compression fractures of four upper to midthoracic vertebra, likely old. IMPRESSION: 1. No acute cardiopulmonary disease. 2. COPD/emphysema. 3. Mild cardiomegaly. 4. Mild thoracic vertebral compression fractures, of unclear chronicity, but likely old if there is no current back pain. Electronically Signed   By: Lajean Manes M.D.   On: 01/11/2017 15:05   Dg Chest Portable 1 View  Result Date: 01/11/2017 CLINICAL DATA:  Initial evaluation for acute respiratory distress. EXAM: PORTABLE CHEST  1 VIEW COMPARISON:  Prior radiograph from 06/10/2016. FINDINGS: Cardiomegaly.  Mediastinal silhouette normal. Lungs hyperinflated with underlying emphysematous changes. Superimposed bibasilar interstitial prominence with scattered Kerley B-lines, suggesting mild pulmonary interstitial edema. Superimposed infection not excluded. Blunting of the right costophrenic angle, suggestive of small effusion.  No pneumothorax. No acute osseus abnormality. IMPRESSION: 1. Bibasilar interstitial congestion/prominence, suggesting mild pulmonary interstitial edema. Superimposed infection not excluded, and could be considered in the correct clinical setting. 2. Blunting of the right costophrenic angle, suggestive of small effusion. 3. Underlying emphysema. 4. Cardiomegaly. Electronically Signed   By: Jeannine Boga M.D.   On: 01/11/2017 21:02    ____________________________________________   PROCEDURES Procedures CRITICAL CARE Performed by: Joni Fears, Lavene Penagos   Total critical care time: 35 minutes  Critical care time was exclusive of separately billable procedures and treating other patients.  Critical care was necessary to treat or prevent imminent or life-threatening deterioration.  Critical care was time spent personally by me on the following activities: development of treatment plan with patient and/or surrogate as well as nursing, discussions with consultants, evaluation of patient's response to treatment, examination of patient, obtaining history from patient or surrogate, ordering and performing treatments and interventions, ordering and review of laboratory studies, ordering and review of radiographic studies, pulse oximetry and re-evaluation of patient's condition.  ____________________________________________   INITIAL IMPRESSION / ASSESSMENT AND PLAN / ED COURSE  Pertinent labs & imaging results that were available during my care of the patient were reviewed by me and considered in my medical  decision making (see chart for details).    Clinical Course as of Jan 12 2220  Wed Jan 11, 2017  2037 Resp distress. Bipap. Likely co2 narcosis. May need intubation, will monitor response to initial bipap tx.   [PS]  2138 High lactate. Elevated wbc. Unclear if pt has underlying pna. Will tx as sepsis. Cx, abx, continue IVF. Plan ICU admit. Confirmed pt to be full code at this time  [PS]  2139 Daughter at bedside updated.   [PS]    Clinical Course User Index [PS] Carrie Mew, MD     ----------------------------------------- 10:18 PM on 01/11/2017 -----------------------------------------  On initial presentation, patient clinically appeared to have such severe respiratory acidosis from CO2 retention that paralyzing her for intubation would almost certainly result in cardiac arrest due to severe acidosis. Therefore, BiPAP was initiated to see if we could improve her blood chemistry enough to make intubation safer. However, on reassessment at about 9:00 PM, patient was now awake alert and talking to her daughter. She asked [place] to inform her sister that she was in the hospital. She is responsive to her name. Therefore with her rapid improvement on BiPAP, I feel that intubation is not necessary and not in her best interest given her eating elderly, frail, with underlying severe lung disease. Continue supportive measures, case discussed with hospitalist for ICU admission. Due to lab abnormalities, and possible pneumonia, patient given IV antibiotics for [facility] acquired pneumonia, large fluid boluses for possible sepsis with her borderline hypotension.   ____________________________________________   FINAL CLINICAL IMPRESSION(S) / ED DIAGNOSES  Final diagnoses:  Respiratory distress  CO2 narcosis  Acute respiratory failure with hypoxia and hypercapnia (HCC)  Sepsis, due to unspecified organism [facility])      New Prescriptions   No medications on file     Portions of this note  were generated with dragon dictation software. Dictation errors may occur despite best attempts at proofreading.    Carrie Mew, MD 01/11/17 2221

## 2017-01-11 NOTE — Progress Notes (Signed)
Subjective:    Patient ID: Mary Howe, female    DOB: 1932/10/05, 83 y.o.   MRN: [id number]  CC: Mary Howe is a 83 y.o. female who presents today for an acute visit.    HPI: CC: cough x one week, unchanged.  Endorses sob, wheezing   Denies leg swelling, exertional chest pain or pressure, numbness or tingling radiating to left arm or jaw, palpitations, dizziness, frequent headaches, changes in vision.   H/o copd- has 02 at home. 'not using very much however using past 2 days.' Uses 2L. Sa02 at home  97%.   On albuterol prn- with some relief.     simonds 02/2016- reviewed note. Started anoro HISTORY:  Past Medical History:  Diagnosis Date  . COPD (chronic obstructive pulmonary disease) ([place])   . Diverticulitis 2015  . GERD (gastroesophageal reflux disease)   . Hyperlipidemia   . Thyroid disease    Past Surgical History:  Procedure Laterality Date  . CERVICAL CONE BIOPSY    . CHOLECYSTECTOMY    . HEMORROIDECTOMY     Family History  Problem Relation Age of Onset  . Prostate cancer Father   . Pneumonia Brother   . Lung disease Brother   . CAD Brother   . Heart disease Brother   . CAD Brother   . Heart attack Brother   . Brain cancer Brother   . Bone cancer Sister   . Emphysema Sister   . Fibrocystic breast disease Sister   . Aneurysm Sister   . Heart disease Sister     Allergies: Simvastatin Current Outpatient Prescriptions on File Prior to Visit  Medication Sig Dispense Refill  . acetaminophen (TYLENOL) 500 MG tablet Take 500 mg by mouth every 6 (six) hours as needed.    Marland Kitchen albuterol (PROVENTIL HFA;VENTOLIN HFA) 108 (90 Base) MCG/ACT inhaler Inhale 1-2 puffs into the lungs every 4 (four) hours as needed for wheezing or shortness of breath. 1 Inhaler 10  . alendronate (FOSAMAX) 70 MG tablet Take 1 tablet (70 mg total) by mouth every 7 (seven) days. Take with a full glass of water on an empty stomach. 4 tablet 11  . ALPRAZolam (XANAX) 0.25 MG tablet Take 1  tablet (0.25 mg total) by mouth 2 (two) times daily as needed for anxiety. 20 tablet 0  . benzocaine (ORAJEL) 10 % mucosal gel Use as directed in the mouth or throat 3 (three) times daily as needed for mouth pain. 5.3 g 0  . calcium carbonate (OS-CAL) 600 MG TABS tablet Take 600 mg by mouth daily with breakfast.     . cetirizine (ZYRTEC) 10 MG tablet Take 10 mg by mouth daily.    . diphenhydrAMINE (BENADRYL) 25 MG tablet Take 25 mg by mouth at bedtime as needed.    Marland Kitchen escitalopram (LEXAPRO) 10 MG tablet TAKE 1 TABLET(10 MG) BY MOUTH DAILY 30 tablet 0  . lidocaine (LIDODERM) 5 % Place 1 patch onto the skin daily. Remove & Discard patch within 12 hours or as directed by MD 30 patch 0  . losartan (COZAAR) 50 MG tablet Take 2 tablets (100 mg total) by mouth daily. (Patient taking differently: Take 50 mg by mouth daily. ) 60 tablet 2  . MULTIPLE VITAMIN PO Take 1 tablet by mouth daily.     . pantoprazole (PROTONIX) 40 MG tablet TAKE 1 TABLET(40 MG) BY MOUTH DAILY 90 tablet 0  . valACYclovir (VALTREX) 1000 MG tablet Take 1,000 mg by mouth 2 (two) times daily.  No current facility-administered medications on file prior to visit.     Social History  Substance Use Topics  . Smoking status: Former Research scientist (life sciences)  . Smokeless tobacco: Never Used     Comment: quit smoking in 1990  . Alcohol use 0.0 oz/week     Comment: OCCASIONALLY DRINKS WINE    Review of Systems  Constitutional: Negative for chills and fever.  HENT: Positive for congestion. Negative for ear pain, sinus pain and sore throat.   Respiratory: Positive for cough, shortness of breath and wheezing.   Cardiovascular: Negative for chest pain and palpitations.  Gastrointestinal: Negative for nausea and vomiting.      Objective:    BP 132/68   Pulse 85   Temp 98.2 F (36.8 C) (Oral)   Ht 5' 3.5" (1.613 m)   Wt 126 lb 9.6 oz (57.4 kg)   SpO2 97%   BMI 22.07 kg/m    Physical Exam  Constitutional: She appears well-developed and  well-nourished.  HENT:  Head: Normocephalic and atraumatic.  Right Ear: Hearing, tympanic membrane, external ear and ear canal normal. No drainage, swelling or tenderness. No foreign bodies. Tympanic membrane is not erythematous and not bulging. No middle ear effusion. No decreased hearing is noted.  Left Ear: Hearing, tympanic membrane, external ear and ear canal normal. No drainage, swelling or tenderness. No foreign bodies. Tympanic membrane is not erythematous and not bulging.  No middle ear effusion. No decreased hearing is noted.  Nose: Nose normal. No rhinorrhea. Right sinus exhibits no maxillary sinus tenderness and no frontal sinus tenderness. Left sinus exhibits no maxillary sinus tenderness and no frontal sinus tenderness.  Mouth/Throat: Uvula is midline, oropharynx is clear and moist and mucous membranes are normal. No oropharyngeal exudate, posterior oropharyngeal edema, posterior oropharyngeal erythema or tonsillar abscesses.  Eyes: Conjunctivae are normal.  Cardiovascular: Regular rhythm, normal heart sounds and normal pulses.   Pulmonary/Chest: Effort normal. She has decreased breath sounds in the right lower field and the left lower field. She has wheezes. She has no rhonchi. She has no rales.  Lymphadenopathy:       Head (right side): No submental, no submandibular, no tonsillar, no preauricular, no posterior auricular and no occipital adenopathy present.       Head (left side): No submental, no submandibular, no tonsillar, no preauricular, no posterior auricular and no occipital adenopathy present.    She has no cervical adenopathy.  Neurological: She is alert.  Skin: Skin is warm and dry.  Psychiatric: She has a normal mood and affect. Her speech is normal and behavior is normal. Thought content normal.  Vitals reviewed. Patient felt significantly better after albuterol treatment. Lung sounds clear and increased      Assessment & Plan:   Problem List Items Addressed This  Visit      Respiratory   COPD (chronic obstructive pulmonary disease) ([place]) - Primary    Sa02 improved after nebulizer.  Pending cxr        Relevant Medications   predniSONE (DELTASONE) 10 MG tablet   levofloxacin (LEVAQUIN) 750 MG tablet   ipratropium-albuterol (DUONEB) 0.5-2.5 (3) MG/3ML SOLN   Other Relevant Orders   DG Chest 2 View   DME Nebulizer machine         I am having Ms. Vallandingham maintain her MULTIPLE VITAMIN PO, calcium carbonate, valACYclovir, albuterol, diphenhydrAMINE, acetaminophen, benzocaine, lidocaine, alendronate, cetirizine, losartan, ALPRAZolam, pantoprazole, and escitalopram.   No orders of the defined types were placed in this encounter.   Return  precautions given.   Risks, benefits, and alternatives of the medications and treatment plan prescribed today were discussed, and patient expressed understanding.   Education regarding symptom management and diagnosis given to patient on AVS.  Continue to follow with [name], MD for routine health maintenance.   Mary Howe and I agreed with plan.   Mable Paris, FNP

## 2017-01-11 NOTE — ED Notes (Signed)
Radiology at bedside

## 2017-01-11 NOTE — ED Notes (Signed)
Admission MD at bedside.  

## 2017-01-11 NOTE — Progress Notes (Signed)
Pharmacy Antibiotic Note  Mary Howe is a 83 y.o. female admitted on 01/11/2017 with pneumonia.  Pharmacy has been consulted for azithromycin and ceftriaxone dosing.  Plan: Azithromycin 500 mg daily and ceftriaxone 1 gram daily ordered.  Height: 5' 3.5" (161.3 cm) Weight: 126 lb (57.2 kg) IBW/kg (Calculated) : 53.55  Temp (24hrs), Avg:98.2 F (36.8 C), Min:98.2 F (36.8 C), Max:98.2 F (36.8 C)   Recent Labs Lab 01/11/17 2030 01/11/17 2036  WBC 13.6*  --   CREATININE 0.87  --   LATICACIDVEN  --  3.0*    Estimated Creatinine Clearance: 42.2 mL/min (by C-G formula based on SCr of 0.87 mg/dL).    Allergies  Allergen Reactions  . Simvastatin     Liver enzyme elevation    Antimicrobials this admission: Azithromyicn, ceftriaxone 10/3  >>    >>   Dose adjustments this admission:   Microbiology results: 10/3 BCx: pending      10/3 CXR: infection not excluded Thank you for allowing pharmacy to be a part of this patient's care.  Tyan Dy S 01/11/2017 10:07 PM

## 2017-01-11 NOTE — ED Triage Notes (Signed)
Pt comes into the ED via [facility] from home c/o respiratory distress.  Patient not verbalizing information to [place] but is awake.  Patient at 94% on non-rebreather.  Seen at MD this morning and given nebs and steroids. Given 125 solumedrol in route, 2 albuterols, 2 duonebs, and .3 epi.

## 2017-01-12 ENCOUNTER — Inpatient Hospital Stay (HOSPITAL_COMMUNITY)
Admit: 2017-01-12 | Discharge: 2017-01-12 | Disposition: A | Discharge status: Home / Self Care | Payer: Medicare Other | Attending: Internal Medicine | Admitting: Internal Medicine

## 2017-01-12 DIAGNOSIS — I340 Nonrheumatic mitral (valve) insufficiency: Secondary | ICD-10-CM

## 2017-01-12 DIAGNOSIS — J9602 Acute respiratory failure with hypercapnia: Secondary | ICD-10-CM

## 2017-01-12 DIAGNOSIS — J441 Chronic obstructive pulmonary disease with (acute) exacerbation: Secondary | ICD-10-CM

## 2017-01-12 LAB — BASIC METABOLIC PANEL
ANION GAP: 7 (ref 5–15)
BUN: 19 mg/dL (ref 6–20)
CALCIUM: 8.2 mg/dL — AB (ref 8.9–10.3)
CO2: 24 mmol/L (ref 22–32)
Chloride: 104 mmol/L (ref 101–111)
Creatinine, Ser: 0.75 mg/dL (ref 0.44–1.00)
GLUCOSE: 256 mg/dL — AB (ref 65–99)
POTASSIUM: 3.9 mmol/L (ref 3.5–5.1)
SODIUM: 135 mmol/L (ref 135–145)

## 2017-01-12 LAB — CBC
HEMATOCRIT: 42 % (ref 35.0–47.0)
HEMOGLOBIN: 14.4 g/dL (ref 12.0–16.0)
MCH: 32.9 pg (ref 26.0–34.0)
MCHC: 34.2 g/dL (ref 32.0–36.0)
MCV: 96 fL (ref 80.0–100.0)
Platelets: 276 10*3/uL (ref 150–440)
RBC: 4.38 MIL/uL (ref 3.80–5.20)
RDW: 13.7 % (ref 11.5–14.5)
WBC: 11.9 10*3/uL — AB (ref 3.6–11.0)

## 2017-01-12 LAB — URINALYSIS, ROUTINE W REFLEX MICROSCOPIC
BILIRUBIN URINE: NEGATIVE
Glucose, UA: 50 mg/dL — AB
HGB URINE DIPSTICK: NEGATIVE
Ketones, ur: NEGATIVE mg/dL
NITRITE: NEGATIVE
PROTEIN: 30 mg/dL — AB
Specific Gravity, Urine: 1.013 (ref 1.005–1.030)
pH: 5 (ref 5.0–8.0)

## 2017-01-12 LAB — INFLUENZA PANEL BY PCR (TYPE A & B)
INFLBPCR: NEGATIVE
Influenza A By PCR: NEGATIVE

## 2017-01-12 LAB — RESPIRATORY PANEL BY PCR
Adenovirus: NOT DETECTED
BORDETELLA PERTUSSIS-RVPCR: NOT DETECTED
CORONAVIRUS 229E-RVPPCR: NOT DETECTED
Chlamydophila pneumoniae: NOT DETECTED
Coronavirus HKU1: NOT DETECTED
Coronavirus NL63: NOT DETECTED
Coronavirus OC43: NOT DETECTED
INFLUENZA A-RVPPCR: NOT DETECTED
INFLUENZA B-RVPPCR: NOT DETECTED
MYCOPLASMA PNEUMONIAE-RVPPCR: NOT DETECTED
Metapneumovirus: NOT DETECTED
PARAINFLUENZA VIRUS 4-RVPPCR: NOT DETECTED
Parainfluenza Virus 1: NOT DETECTED
Parainfluenza Virus 2: NOT DETECTED
Parainfluenza Virus 3: NOT DETECTED
RESPIRATORY SYNCYTIAL VIRUS-RVPPCR: NOT DETECTED
Rhinovirus / Enterovirus: NOT DETECTED

## 2017-01-12 LAB — ECHOCARDIOGRAM COMPLETE
Height: 63 in
Weight: 2109.36 oz

## 2017-01-12 LAB — GLUCOSE, CAPILLARY
GLUCOSE-CAPILLARY: 118 mg/dL — AB (ref 65–99)
GLUCOSE-CAPILLARY: 174 mg/dL — AB (ref 65–99)
GLUCOSE-CAPILLARY: 204 mg/dL — AB (ref 65–99)
GLUCOSE-CAPILLARY: 295 mg/dL — AB (ref 65–99)
Glucose-Capillary: 232 mg/dL — ABNORMAL HIGH (ref 65–99)
Glucose-Capillary: 191 mg/dL — ABNORMAL HIGH (ref 65–99)

## 2017-01-12 LAB — MRSA PCR SCREENING: MRSA by PCR: NEGATIVE

## 2017-01-12 LAB — LACTIC ACID, PLASMA: LACTIC ACID, VENOUS: 3.3 mmol/L — AB (ref 0.5–1.9)

## 2017-01-12 LAB — PROCALCITONIN: Procalcitonin: 0.19 ng/mL

## 2017-01-12 LAB — TROPONIN I: Troponin I: 0.54 ng/mL (ref <0.03)

## 2017-01-12 MED ORDER — METHYLPREDNISOLONE SODIUM SUCC 40 MG IJ SOLR
40.0000 mg | Freq: Two times a day (BID) | INTRAMUSCULAR | Status: DC
  Administered 2017-01-12 – 2017-01-17 (×10): 40 mg via INTRAVENOUS
  Filled 2017-01-12 (×10): qty 1

## 2017-01-12 MED ORDER — BUDESONIDE 0.25 MG/2ML IN SUSP
0.2500 mg | Freq: Two times a day (BID) | RESPIRATORY_TRACT | Status: DC
  Administered 2017-01-12 (×2): 0.25 mg via RESPIRATORY_TRACT
  Filled 2017-01-12 (×2): qty 2

## 2017-01-12 MED ORDER — ORAL CARE MOUTH RINSE
15.0000 mL | Freq: Two times a day (BID) | OROMUCOSAL | Status: DC
  Administered 2017-01-12 – 2017-01-14 (×3): 15 mL via OROMUCOSAL

## 2017-01-12 MED ORDER — INSULIN ASPART 100 UNIT/ML ~~LOC~~ SOLN
0.0000 [IU] | Freq: Three times a day (TID) | SUBCUTANEOUS | Status: DC
  Administered 2017-01-12: 3 [IU] via SUBCUTANEOUS
  Administered 2017-01-13 – 2017-01-15 (×5): 2 [IU] via SUBCUTANEOUS
  Administered 2017-01-15: 3 [IU] via SUBCUTANEOUS
  Filled 2017-01-12 (×7): qty 1

## 2017-01-12 MED ORDER — NOREPINEPHRINE BITARTRATE 1 MG/ML IV SOLN
0.0000 ug/min | INTRAVENOUS | Status: DC
  Administered 2017-01-12: 2 ug/min via INTRAVENOUS
  Filled 2017-01-12: qty 4

## 2017-01-12 MED ORDER — IPRATROPIUM-ALBUTEROL 0.5-2.5 (3) MG/3ML IN SOLN
3.0000 mL | RESPIRATORY_TRACT | Status: DC
  Administered 2017-01-12 (×3): 3 mL via RESPIRATORY_TRACT
  Filled 2017-01-12 (×2): qty 3

## 2017-01-12 MED ORDER — PREMIER PROTEIN SHAKE
11.0000 [oz_av] | Freq: Two times a day (BID) | ORAL | Status: DC
  Administered 2017-01-12 – 2017-01-18 (×11): 11 [oz_av] via ORAL

## 2017-01-12 MED ORDER — INSULIN ASPART 100 UNIT/ML ~~LOC~~ SOLN: 0.0000 [IU] | Freq: Every day | SUBCUTANEOUS | Status: DC

## 2017-01-12 MED ORDER — INSULIN ASPART 100 UNIT/ML ~~LOC~~ SOLN
3.0000 [IU] | Freq: Three times a day (TID) | SUBCUTANEOUS | Status: DC
  Administered 2017-01-12 – 2017-01-18 (×12): 3 [IU] via SUBCUTANEOUS
  Filled 2017-01-12 (×13): qty 1

## 2017-01-12 MED ORDER — BUDESONIDE 0.5 MG/2ML IN SUSP
0.5000 mg | Freq: Two times a day (BID) | RESPIRATORY_TRACT | Status: DC
  Administered 2017-01-12 – 2017-01-18 (×12): 0.5 mg via RESPIRATORY_TRACT
  Filled 2017-01-12 (×11): qty 2

## 2017-01-12 MED ORDER — IPRATROPIUM-ALBUTEROL 0.5-2.5 (3) MG/3ML IN SOLN
3.0000 mL | RESPIRATORY_TRACT | Status: DC
  Administered 2017-01-12 – 2017-01-15 (×18): 3 mL via RESPIRATORY_TRACT
  Filled 2017-01-12 (×17): qty 3

## 2017-01-12 MED ORDER — CHLORHEXIDINE GLUCONATE 0.12 % MT SOLN
15.0000 mL | Freq: Two times a day (BID) | OROMUCOSAL | Status: DC
  Administered 2017-01-12 – 2017-01-18 (×11): 15 mL via OROMUCOSAL
  Filled 2017-01-12 (×9): qty 15

## 2017-01-12 NOTE — Progress Notes (Signed)
Patient was admitted to ICU last night.

## 2017-01-12 NOTE — Progress Notes (Signed)
*  PRELIMINARY RESULTS* Echocardiogram 2D Echocardiogram has been performed.  Mary Howe 01/12/2017, 3:16 PM

## 2017-01-12 NOTE — Progress Notes (Signed)
Initial Nutrition Assessment  DOCUMENTATION CODES:   Not applicable  INTERVENTION:  1. Recommend premier Protein BID, each supplement provides 160 calories and 30 grams of protein  NUTRITION DIAGNOSIS:   Increased nutrient needs related to wound healing as evidenced by estimated needs  GOAL:   Patient will meet greater than or equal to 90% of their needs  MONITOR:   PO intake, I & O's, Labs, Supplement acceptance, Weight trends  REASON FOR ASSESSMENT:   Rounds    ASSESSMENT:   83 yo female with PMH of DM, COPD, admitted 10/3 with acute on chronic hypoxic hypercapnic respiratory failure secondary to atypical pulmonary infection vs. pulmonary interstitial edema and AECOPD Now off BiPAP. Resting comfortably on nasal cannula.  Spoke with patient and patient's daughter. Patient reports good PO intake PTA with 8 pound intentional weight loss since January due to dietary modifications. She stopped eating "cookies and crackers" and now tries to eat a healthier diet to control her blood sugar. Also eats a low sodium diet. Reports a normal intake of premier protein or toast and eggs in the morning with coffee, grilled or baked chicken and soup, or a restaurant meal for lunch, and fruit with cottage cheese or yogurt in the evening along with another premier protein shake. She denies any chewing or swallowing problems.  Ate some cantaloupe, grapes, cottage cheese, and yogurt for lunch today - ate approximately 50-75% per daughter.  Labs reviewed: CBGs 191, 204, 232 Medications reviewed and include:  Novolog 0-15 Units TID, 0-5 Units HS, Novolog 3 Units TID, Solumedrol  Diet Order:  Diet heart healthy/carb modified Room service appropriate? Yes; Fluid consistency: Thin  Skin:  Reviewed, no issues  Last BM:  PTA  Height:   Ht Readings from Last 1 Encounters:  01/12/17 5\' 3"  (1.6 m)    Weight:   Wt Readings from Last 1 Encounters:  01/12/17 131 lb 13.4 oz (59.8 kg)    Ideal  Body Weight:  52.27 kg  BMI:  Body mass index is 23.35 kg/m.  Estimated Nutritional Needs:   Kcal:  1250-1446 calories (MSJ x1.2-1.4)  Protein:  77-89 grams (1.3-1.5g/kg)  Fluid:  >1.5L  EDUCATION NEEDS:   Education needs addressed  Satira Anis. Kenita Bines, MS, RD LDN Inpatient Clinical Dietitian Pager [phone]

## 2017-01-12 NOTE — Progress Notes (Signed)
[facility]                                                                                                                                                                                  Patient Demographics   Mary Howe, is a 83 y.o. female, DOB - August 09, 1932, BMW:[id number]  Admit date - 01/11/2017   Admitting Physician Lance Coon, MD  Outpatient Primary MD for the patient is [name], MD   LOS - 1  Subjective: Patient admitted with shortness of breath was on BiPAP now on oxygen.    Review of Systems:   CONSTITUTIONAL: No documented fever. No fatigue, weakness. No weight gain, no weight loss.  EYES: No blurry or double vision.  ENT: No tinnitus. No postnasal drip. No redness of the oropharynx.  RESPIRATORY:  positive cough, no wheeze, no hemoptysis. Positive dyspnea.  CARDIOVASCULAR: No chest pain. No orthopnea. No palpitations. No syncope.  GASTROINTESTINAL: No nausea, no vomiting or diarrhea. No abdominal pain. No melena or hematochezia.  GENITOURINARY: No dysuria or hematuria.  ENDOCRINE: No polyuria or nocturia. No heat or cold intolerance.  HEMATOLOGY: No anemia. No bruising. No bleeding.  INTEGUMENTARY: No rashes. No lesions.  MUSCULOSKELETAL: No arthritis. No swelling. No gout.  NEUROLOGIC: No numbness, tingling, or ataxia. No seizure-type activity.  PSYCHIATRIC: No anxiety. No insomnia. No ADD.    Vitals:   Vitals:   01/12/17 0900 01/12/17 1008 01/12/17 1016 01/12/17 1240  BP: 128/86  96/81   Pulse: (!) 124 (!) 127 (!) 126 (!) 114  Resp: (!) 25 (!) 29 (!) 31 17  Temp:      TempSrc:      SpO2: 93% (!) 87% 95% 92%  Weight:      Height:        Wt Readings from Last 3 Encounters:  01/12/17 131 lb 13.4 oz (59.8 kg)  01/11/17 126 lb 9.6 oz (57.4 kg)  12/14/16 128 lb 6.4 oz (58.2 kg)     Intake/Output Summary (Last 24 hours) at 01/12/17 1357 Last data filed at 01/12/17 1021  Gross per 24 hour  Intake            261.22 ml  Output              145 ml  Net           116.22 ml    Physical Exam:   GENERAL: Pleasant-appearing in no apparent distress.  HEAD, EYES, EARS, NOSE AND THROAT: Atraumatic, normocephalic. Extraocular muscles are intact. Pupils equal and reactive to light. Sclerae anicteric. No conjunctival injection. No oro-pharyngeal erythema.  NECK: Supple. There is no jugular venous  distention. No bruits, no lymphadenopathy, no thyromegaly.  HEART: Regular rate and rhythm,. No murmurs, no rubs, no clicks.  LUNGS: Bilateral wheezing throughout both lungs ABDOMEN: Soft, flat, nontender, nondistended. Has good bowel sounds. No hepatosplenomegaly appreciated.  EXTREMITIES: No evidence of any cyanosis, clubbing, or peripheral edema.  +2 pedal and radial pulses bilaterally.  NEUROLOGIC: The patient is alert, awake, and oriented x3 with no focal motor or sensory deficits appreciated bilaterally.  SKIN: Moist and warm with no rashes appreciated.  Psych: Not anxious, depressed LN: No inguinal LN enlargement    Antibiotics   Anti-infectives    Start     Dose/Rate Route Frequency Ordered Stop   01/12/17 1800  azithromycin (ZITHROMAX) 500 mg in dextrose 5 % 250 mL IVPB     500 mg 250 mL/hr over 60 Minutes Intravenous Every 24 hours 01/11/17 2203     01/12/17 1700  cefTRIAXone (ROCEPHIN) 1 g in dextrose 5 % 50 mL IVPB     1 g 100 mL/hr over 30 Minutes Intravenous Every 24 hours 01/11/17 2203     01/11/17 2300  valACYclovir (VALTREX) tablet 1,000 mg     1,000 mg Oral 2 times daily 01/11/17 2248     01/11/17 2145  cefTRIAXone (ROCEPHIN) 1 g in dextrose 5 % 50 mL IVPB - Premix     1 g 100 mL/hr over 30 Minutes Intravenous  Once 01/11/17 2133     01/11/17 2145  azithromycin (ZITHROMAX) 500 mg in dextrose 5 % 250 mL IVPB     500 mg 250 mL/hr over 60 Minutes Intravenous  Once 01/11/17 2133 01/12/17 0027      Medications   Scheduled Meds: . budesonide (PULMICORT) nebulizer solution  0.5 mg  Nebulization BID  . chlorhexidine  15 mL Mouth Rinse BID  . enoxaparin (LOVENOX) injection  40 mg Subcutaneous Q24H  . escitalopram  10 mg Oral Daily  . insulin aspart  0-15 Units Subcutaneous TID WC  . insulin aspart  0-5 Units Subcutaneous QHS  . insulin aspart  3 Units Subcutaneous TID WC  . ipratropium-albuterol  3 mL Nebulization Q4H  . mouth rinse  15 mL Mouth Rinse q12n4p  . methylPREDNISolone (SOLU-MEDROL) injection  40 mg Intravenous Q12H  . pantoprazole  40 mg Oral Daily  . valACYclovir  1,000 mg Oral BID   Continuous Infusions: . azithromycin    . cefTRIAXone (ROCEPHIN)  IV    . cefTRIAXone (ROCEPHIN) IVPB 1 gram/50 mL D5W Stopped (01/12/17 0028)  . norepinephrine (LEVOPHED) Adult infusion 1 mcg/min (01/12/17 1343)   PRN Meds:.acetaminophen **OR** acetaminophen, ALPRAZolam, ipratropium-albuterol, ondansetron **OR** ondansetron (ZOFRAN) IV   Data Review:   Micro Results Recent Results (from the past 240 hour(s))  Culture, blood (routine x 2)     Status: None (Preliminary result)   Collection Time: 01/11/17 10:09 PM  Result Value Ref Range Status   Specimen Description BLOOD RFOA  Final   Special Requests   Final    BOTTLES DRAWN AEROBIC AND ANAEROBIC Blood Culture adequate volume   Culture NO GROWTH < 12 HOURS  Final   Report Status PENDING  Incomplete  Culture, blood (routine x 2)     Status: None (Preliminary result)   Collection Time: 01/11/17 10:09 PM  Result Value Ref Range Status   Specimen Description BLOOD LFOA  Final   Special Requests   Final    BOTTLES DRAWN AEROBIC AND ANAEROBIC Blood Culture adequate volume   Culture NO GROWTH < 12 HOURS  Final  Report Status PENDING  Incomplete  MRSA PCR Screening     Status: None   Collection Time: 01/12/17 12:08 AM  Result Value Ref Range Status   MRSA by PCR NEGATIVE NEGATIVE Final    Comment:        The GeneXpert MRSA Assay (FDA approved for NASAL specimens only), is one component of a comprehensive MRSA  colonization surveillance program. It is not intended to diagnose MRSA infection nor to guide or monitor treatment for MRSA infections.     Radiology Reports Dg Chest 2 View  Result Date: 01/11/2017 CLINICAL DATA:  Cough.  History of COPD. EXAM: CHEST  2 VIEW COMPARISON:  None. FINDINGS: The cardiac silhouette is mildly enlarged. No mediastinal or hilar masses. No evidence of adenopathy. Lungs are hyperexpanded. Prominent bronchovascular markings are noted in the lung bases most evident in the lower lobes. There is a paucity of vascular markings in the upper lobes consistent with emphysema. No evidence of pneumonia or pulmonary edema. No pleural effusion or pneumothorax. Skeletal structures are demineralized. There are mild compression fractures of four upper to midthoracic vertebra, likely old. IMPRESSION: 1. No acute cardiopulmonary disease. 2. COPD/emphysema. 3. Mild cardiomegaly. 4. Mild thoracic vertebral compression fractures, of unclear chronicity, but likely old if there is no current back pain. Electronically Signed   By: Lajean Manes M.D.   On: 01/11/2017 15:05   Dg Chest Portable 1 View  Result Date: 01/11/2017 CLINICAL DATA:  Initial evaluation for acute respiratory distress. EXAM: PORTABLE CHEST 1 VIEW COMPARISON:  Prior radiograph from 06/10/2016. FINDINGS: Cardiomegaly.  Mediastinal silhouette normal. Lungs hyperinflated with underlying emphysematous changes. Superimposed bibasilar interstitial prominence with scattered Kerley B-lines, suggesting mild pulmonary interstitial edema. Superimposed infection not excluded. Blunting of the right costophrenic angle, suggestive of small effusion. No pneumothorax. No acute osseus abnormality. IMPRESSION: 1. Bibasilar interstitial congestion/prominence, suggesting mild pulmonary interstitial edema. Superimposed infection not excluded, and could be considered in the correct clinical setting. 2. Blunting of the right costophrenic angle, suggestive  of small effusion. 3. Underlying emphysema. 4. Cardiomegaly. Electronically Signed   By: Jeannine Boga M.D.   On: 01/11/2017 21:02     CBC  Recent Labs Lab 01/11/17 2030 01/12/17 0350  WBC 13.6* 11.9*  HGB 16.0 14.4  HCT 48.1* 42.0  PLT 327 276  MCV 96.3 96.0  MCH 32.0 32.9  MCHC 33.2 34.2  RDW 14.4 13.7  LYMPHSABS 6.4*  --   MONOABS 0.7  --   EOSABS 1.1*  --   BASOSABS 0.2*  --     Chemistries   Recent Labs Lab 01/11/17 2030 01/12/17 0350  NA 132* 135  K 4.4 3.9  CL 97* 104  CO2 23 24  GLUCOSE 388* 256*  BUN 14 19  CREATININE 0.87 0.75  CALCIUM 9.3 8.2*   ------------------------------------------------------------------------------------------------------------------ estimated creatinine clearance is 44.8 mL/min (by C-G formula based on SCr of 0.75 mg/dL). ------------------------------------------------------------------------------------------------------------------ No results for input(s): HGBA1C in the last 72 hours. ------------------------------------------------------------------------------------------------------------------ No results for input(s): CHOL, HDL, LDLCALC, TRIG, CHOLHDL, LDLDIRECT in the last 72 hours. ------------------------------------------------------------------------------------------------------------------ No results for input(s): TSH, T4TOTAL, T3FREE, THYROIDAB in the last 72 hours.  Invalid input(s): FREET3 ------------------------------------------------------------------------------------------------------------------ No results for input(s): VITAMINB12, FOLATE, FERRITIN, TIBC, IRON, RETICCTPCT in the last 72 hours.  Coagulation profile No results for input(s): INR, PROTIME in the last 168 hours.  No results for input(s): DDIMER in the last 72 hours.  Cardiac Enzymes  Recent Labs Lab 01/11/17 2030 01/12/17 0019  TROPONINI 0.10* 0.54*    ------------------------------------------------------------------------------------------------------------------  Invalid input(s): Redbird Smith  Patient's 83 year old with COPD with respiratory failure  1. Acute respiratory failure with hypercapnia (HCC) - Due to COPD exacerbation,  Patient's breathing improved Continue therapy with antibiotics, nebulizers I have discussed with the pulmonologist order a echo  2. Elevated troponin Due to demand ischemia likely, again I discussed with pulmonologist to order a cardiology consult [place] aspirin therapy   3.    CAP (community acquired pneumonia) - IV antibiotics     4.  Essential hypertension -  Patient's blood pressure currently stable not on any medications  5.  Diabetes mellitus without complication (HCC) - sliding scale insulin with corresponding glucose checks  6. Miscellaneous Lovenox for DVT prophylaxis     Code Status Orders        Start     Ordered   01/11/17 2248  Full code  Continuous     01/11/17 2248    Code Status History    Date Active Date Inactive Code Status Order ID Comments User Context   01/12/2016 10:12 AM 01/14/2016 12:46 AM Full Code [id number]  Saundra Shelling, MD Inpatient           Consults  intensvist  DVT Prophylaxis  Lovenox   Lab Results  Component Value Date   PLT 276 01/12/2017     Time Spent in minutes  43min Greater than 50% of time spent in care coordination and counseling patient regarding the condition and plan of care.   Dustin Flock M.D on 01/12/2017 at 1:57 PM  Between 7am to 6pm - Pager - [phone]  After 6pm go to [email or web address] - password [email or web address] [facility]   Office  [phone]

## 2017-01-12 NOTE — Progress Notes (Signed)
Inpatient Diabetes Program Recommendations  AACE/ADA: New Consensus Statement on Inpatient Glycemic Control (2015)  Target Ranges:  Prepandial:   less than 140 mg/dL      Peak postprandial:   less than 180 mg/dL (1-2 hours)      Critically ill patients:  140 - 180 mg/dL   Results for Mary Howe, Mary Howe (MRN [id number]) as of 01/12/2017 09:23  Ref. Range 01/12/2017 00:04 01/12/2017 08:12  Glucose-Capillary Latest Ref Range: 65 - 99 mg/dL 295 (H) 232 (H)   Review of Glycemic Control  Diabetes history: DM2 Outpatient Diabetes medications: none Current orders for Inpatient glycemic control: Novolog 0-9 units TID with meals, Novolog 0-5 units QHS  Inpatient Diabetes Program Recommendations: Correction (SSI): Please consider increasing Novolog correction scale to 0-15 units TID with meals. Insulin - Meal Coverage: If steroids are continued, please consider ordering Novolog 3 units TID with meals for meal coverage if patient eats at least 50% of meals. HgbA1C: A1C 6.1% on 11/10/16.  NOTE: In reviewing chart, noted patient has recently been taking Prednisone taper. Patient is currently ordered Solumedrol 40 mg Q12H which is contributing to hyperglycemia.  Thanks, Barnie Alderman, RN, MSN, CDE Diabetes Coordinator Inpatient Diabetes Program [phone] (Team Pager from 8am to 5pm)

## 2017-01-13 ENCOUNTER — Inpatient Hospital Stay: Payer: Medicare Other

## 2017-01-13 ENCOUNTER — Encounter: Payer: Self-pay | Admitting: Nurse Practitioner

## 2017-01-13 DIAGNOSIS — I10 Essential (primary) hypertension: Secondary | ICD-10-CM

## 2017-01-13 DIAGNOSIS — I420 Dilated cardiomyopathy: Secondary | ICD-10-CM

## 2017-01-13 DIAGNOSIS — E119 Type 2 diabetes mellitus without complications: Secondary | ICD-10-CM

## 2017-01-13 DIAGNOSIS — A419 Sepsis, unspecified organism: Secondary | ICD-10-CM

## 2017-01-13 DIAGNOSIS — J9601 Acute respiratory failure with hypoxia: Secondary | ICD-10-CM

## 2017-01-13 DIAGNOSIS — R748 Abnormal levels of other serum enzymes: Secondary | ICD-10-CM

## 2017-01-13 LAB — TROPONIN I
TROPONIN I: 0.43 ng/mL — AB (ref <0.03)
Troponin I: 0.34 ng/mL (ref <0.03)

## 2017-01-13 LAB — URINE CULTURE

## 2017-01-13 LAB — GLUCOSE, CAPILLARY
GLUCOSE-CAPILLARY: 144 mg/dL — AB (ref 65–99)
Glucose-Capillary: 137 mg/dL — ABNORMAL HIGH (ref 65–99)
Glucose-Capillary: 112 mg/dL — ABNORMAL HIGH (ref 65–99)
Glucose-Capillary: 147 mg/dL — ABNORMAL HIGH (ref 65–99)

## 2017-01-13 LAB — PROCALCITONIN: Procalcitonin: 0.42 ng/mL

## 2017-01-13 MED ORDER — GUAIFENESIN 200 MG PO TABS
200.0000 mg | ORAL_TABLET | ORAL | Status: DC | PRN
  Filled 2017-01-13: qty 1

## 2017-01-13 MED ORDER — FUROSEMIDE 10 MG/ML IJ SOLN: 40.0000 mg | Freq: Two times a day (BID) | INTRAMUSCULAR | Status: DC

## 2017-01-13 MED ORDER — ATORVASTATIN CALCIUM 20 MG PO TABS
40.0000 mg | ORAL_TABLET | Freq: Every day | ORAL | Status: DC
  Administered 2017-01-13 – 2017-01-18 (×6): 40 mg via ORAL
  Filled 2017-01-13 (×6): qty 2

## 2017-01-13 MED ORDER — FUROSEMIDE 10 MG/ML IJ SOLN
20.0000 mg | Freq: Two times a day (BID) | INTRAMUSCULAR | Status: DC
  Administered 2017-01-13 (×2): 20 mg via INTRAVENOUS
  Filled 2017-01-13 (×2): qty 2

## 2017-01-13 MED ORDER — GUAIFENESIN 100 MG/5ML PO SOLN
10.0000 mL | ORAL | Status: DC | PRN
  Administered 2017-01-13 – 2017-01-15 (×5): 200 mg via ORAL
  Filled 2017-01-13 (×7): qty 10

## 2017-01-13 MED ORDER — ASPIRIN 81 MG PO CHEW
81.0000 mg | CHEWABLE_TABLET | Freq: Every day | ORAL | Status: DC
  Administered 2017-01-13 – 2017-01-18 (×5): 81 mg via ORAL
  Filled 2017-01-13 (×5): qty 1

## 2017-01-13 MED ORDER — DEXTROSE 5 % IV SOLN
1.0000 g | INTRAVENOUS | Status: DC
  Administered 2017-01-14 – 2017-01-16 (×3): 1 g via INTRAVENOUS
  Filled 2017-01-13 (×5): qty 10

## 2017-01-13 MED ORDER — MIDODRINE HCL 5 MG PO TABS
2.5000 mg | ORAL_TABLET | Freq: Three times a day (TID) | ORAL | Status: DC
  Administered 2017-01-13: 2.5 mg via ORAL
  Filled 2017-01-13: qty 0.5
  Filled 2017-01-13: qty 1

## 2017-01-13 NOTE — Progress Notes (Signed)
[place] at [facility]                                                                                                                                                                                  Patient Demographics   Mary Howe, is a 83 y.o. female, DOB - 1933/03/21, YQI:[id number]  Admit date - 01/11/2017   Admitting Physician Lance Coon, MD  Outpatient Primary MD for the patient is [name], MD   LOS - 2  Subjective: Patient had to be briefly placed on BiPAP last night. Continues to be short of breath    Review of Systems:   CONSTITUTIONAL: No documented fever. No fatigue, weakness. No weight gain, no weight loss.  EYES: No blurry or double vision.  ENT: No tinnitus. No postnasal drip. No redness of the oropharynx.  RESPIRATORY:  positive cough, no wheeze, no hemoptysis. Positive dyspnea.  CARDIOVASCULAR: No chest pain. No orthopnea. No palpitations. No syncope.  GASTROINTESTINAL: No nausea, no vomiting or diarrhea. No abdominal pain. No melena or hematochezia.  GENITOURINARY: No dysuria or hematuria.  ENDOCRINE: No polyuria or nocturia. No heat or cold intolerance.  HEMATOLOGY: No anemia. No bruising. No bleeding.  INTEGUMENTARY: No rashes. No lesions.  MUSCULOSKELETAL: No arthritis. No swelling. No gout.  NEUROLOGIC: No numbness, tingling, or ataxia. No seizure-type activity.  PSYCHIATRIC: No anxiety. No insomnia. No ADD.    Vitals:   Vitals:   01/13/17 0814 01/13/17 1100 01/13/17 1200 01/13/17 1246  BP:  118/66 114/71   Pulse: (!) 104 (!) 104 (!) 107 (!) 110  Resp: (!) 29 20 (!) 21 (!) 27  Temp:   97.8 F (36.6 C)   TempSrc:   Oral   SpO2: 91% 97% 98% 95%  Weight:      Height:        Wt Readings from Last 3 Encounters:  01/12/17 131 lb 13.4 oz (59.8 kg)  01/11/17 126 lb 9.6 oz (57.4 kg)  12/14/16 128 lb 6.4 oz (58.2 kg)     Intake/Output Summary (Last 24 hours) at 01/13/17 1415 Last data filed at 01/13/17  1318  Gross per 24 hour  Intake              530 ml  Output             1850 ml  Net            -1320 ml    Physical Exam:   GENERAL:Mild respiratory distress HEAD, EYES, EARS, NOSE AND THROAT: Atraumatic, normocephalic. Extraocular muscles are intact. Pupils equal and reactive to light. Sclerae anicteric. No conjunctival injection. No oro-pharyngeal erythema.  NECK: Supple.  There is no jugular venous distention. No bruits, no lymphadenopathy, no thyromegaly.  HEART: Regular rate and rhythm,. No murmurs, no rubs, no clicks.  LUNGS: Bilateral wheezing throughout both lungs ABDOMEN: Soft, flat, nontender, nondistended. Has good bowel sounds. No hepatosplenomegaly appreciated.  EXTREMITIES: No evidence of any cyanosis, clubbing, or peripheral edema.  +2 pedal and radial pulses bilaterally.  NEUROLOGIC: The patient is alert, awake, and oriented x3 with no focal motor or sensory deficits appreciated bilaterally.  SKIN: Moist and warm with no rashes appreciated.  Psych: Not anxious, depressed LN: No inguinal LN enlargement    Antibiotics   Anti-infectives    Start     Dose/Rate Route Frequency Ordered Stop   01/12/17 1800  azithromycin (ZITHROMAX) 500 mg in dextrose 5 % 250 mL IVPB     500 mg 250 mL/hr over 60 Minutes Intravenous Every 24 hours 01/11/17 2203     01/12/17 1700  cefTRIAXone (ROCEPHIN) 1 g in dextrose 5 % 50 mL IVPB     1 g 100 mL/hr over 30 Minutes Intravenous Every 24 hours 01/11/17 2203     01/11/17 2300  valACYclovir (VALTREX) tablet 1,000 mg     1,000 mg Oral 2 times daily 01/11/17 2248     01/11/17 2145  cefTRIAXone (ROCEPHIN) 1 g in dextrose 5 % 50 mL IVPB - Premix     1 g 100 mL/hr over 30 Minutes Intravenous  Once 01/11/17 2133     01/11/17 2145  azithromycin (ZITHROMAX) 500 mg in dextrose 5 % 250 mL IVPB     500 mg 250 mL/hr over 60 Minutes Intravenous  Once 01/11/17 2133 01/12/17 0027      Medications   Scheduled Meds: . aspirin  81 mg Oral Daily  .  atorvastatin  40 mg Oral q1800  . budesonide (PULMICORT) nebulizer solution  0.5 mg Nebulization BID  . chlorhexidine  15 mL Mouth Rinse BID  . enoxaparin (LOVENOX) injection  40 mg Subcutaneous Q24H  . escitalopram  10 mg Oral Daily  . furosemide  20 mg Intravenous Q12H  . insulin aspart  0-15 Units Subcutaneous TID WC  . insulin aspart  0-5 Units Subcutaneous QHS  . insulin aspart  3 Units Subcutaneous TID WC  . ipratropium-albuterol  3 mL Nebulization Q4H  . mouth rinse  15 mL Mouth Rinse q12n4p  . methylPREDNISolone (SOLU-MEDROL) injection  40 mg Intravenous Q12H  . midodrine  2.5 mg Oral TID WC  . pantoprazole  40 mg Oral Daily  . protein supplement shake  11 oz Oral BID BM  . valACYclovir  1,000 mg Oral BID   Continuous Infusions: . azithromycin Stopped (01/12/17 2050)  . cefTRIAXone (ROCEPHIN)  IV    . cefTRIAXone (ROCEPHIN) IVPB 1 gram/50 mL D5W Stopped (01/12/17 1836)  . norepinephrine (LEVOPHED) Adult infusion Stopped (01/12/17 1413)   PRN Meds:.acetaminophen **OR** acetaminophen, ALPRAZolam, guaiFENesin, ipratropium-albuterol, ondansetron **OR** ondansetron (ZOFRAN) IV   Data Review:   Micro Results Recent Results (from the past 240 hour(s))  Culture, blood (routine x 2)     Status: None (Preliminary result)   Collection Time: 01/11/17 10:09 PM  Result Value Ref Range Status   Specimen Description BLOOD RFOA  Final   Special Requests   Final    BOTTLES DRAWN AEROBIC AND ANAEROBIC Blood Culture adequate volume   Culture NO GROWTH 2 DAYS  Final   Report Status PENDING  Incomplete  Culture, blood (routine x 2)     Status: None (Preliminary result)  Collection Time: 01/11/17 10:09 PM  Result Value Ref Range Status   Specimen Description BLOOD LFOA  Final   Special Requests   Final    BOTTLES DRAWN AEROBIC AND ANAEROBIC Blood Culture adequate volume   Culture NO GROWTH 2 DAYS  Final   Report Status PENDING  Incomplete  MRSA PCR Screening     Status: None    Collection Time: 01/12/17 12:08 AM  Result Value Ref Range Status   MRSA by PCR NEGATIVE NEGATIVE Final    Comment:        The GeneXpert MRSA Assay (FDA approved for NASAL specimens only), is one component of a comprehensive MRSA colonization surveillance program. It is not intended to diagnose MRSA infection nor to guide or monitor treatment for MRSA infections.   Respiratory Panel by PCR     Status: None   Collection Time: 01/12/17  9:00 AM  Result Value Ref Range Status   Adenovirus NOT DETECTED NOT DETECTED Final   Coronavirus 229E NOT DETECTED NOT DETECTED Final   Coronavirus HKU1 NOT DETECTED NOT DETECTED Final   Coronavirus NL63 NOT DETECTED NOT DETECTED Final   Coronavirus OC43 NOT DETECTED NOT DETECTED Final   Metapneumovirus NOT DETECTED NOT DETECTED Final   Rhinovirus / Enterovirus NOT DETECTED NOT DETECTED Final   Influenza A NOT DETECTED NOT DETECTED Final   Influenza B NOT DETECTED NOT DETECTED Final   Parainfluenza Virus 1 NOT DETECTED NOT DETECTED Final   Parainfluenza Virus 2 NOT DETECTED NOT DETECTED Final   Parainfluenza Virus 3 NOT DETECTED NOT DETECTED Final   Parainfluenza Virus 4 NOT DETECTED NOT DETECTED Final   Respiratory Syncytial Virus NOT DETECTED NOT DETECTED Final   Bordetella pertussis NOT DETECTED NOT DETECTED Final   Chlamydophila pneumoniae NOT DETECTED NOT DETECTED Final   Mycoplasma pneumoniae NOT DETECTED NOT DETECTED Final    Comment: Performed at [facility], [place] [address] [place], [place] [address]  Culture, Urine     Status: Abnormal   Collection Time: 01/12/17 10:13 AM  Result Value Ref Range Status   Specimen Description URINE, RANDOM  Final   Special Requests NONE  Final   Culture MULTIPLE SPECIES PRESENT, SUGGEST RECOLLECTION (A)  Final   Report Status 01/13/2017 FINAL  Final    Radiology Reports Dg Chest 2 View  Result Date: 01/11/2017 CLINICAL DATA:  Cough.  History of COPD. EXAM: CHEST  2 VIEW COMPARISON:  None.  FINDINGS: The cardiac silhouette is mildly enlarged. No mediastinal or hilar masses. No evidence of adenopathy. Lungs are hyperexpanded. Prominent bronchovascular markings are noted in the lung bases most evident in the lower lobes. There is a paucity of vascular markings in the upper lobes consistent with emphysema. No evidence of pneumonia or pulmonary edema. No pleural effusion or pneumothorax. Skeletal structures are demineralized. There are mild compression fractures of four upper to midthoracic vertebra, likely old. IMPRESSION: 1. No acute cardiopulmonary disease. 2. COPD/emphysema. 3. Mild cardiomegaly. 4. Mild thoracic vertebral compression fractures, of unclear chronicity, but likely old if there is no current back pain. Electronically Signed   By: Lajean Manes M.D.   On: 01/11/2017 15:05   Dg Chest Port 1 View  Result Date: 01/13/2017 CLINICAL DATA:  Acute respiratory failure EXAM: PORTABLE CHEST 1 VIEW COMPARISON:  01/11/2017 FINDINGS: Cardiac shadow remains mildly enlarged. Tortuosity of thoracic aorta is noted. Diffuse interstitial changes are again identified and stable from the prior exam. No focal infiltrate is seen. No bony abnormality is noted.  IMPRESSION: Diffuse interstitial changes stable from the previous exam. Electronically Signed   By: Inez Catalina M.D.   On: 01/13/2017 06:20   Dg Chest Portable 1 View  Result Date: 01/11/2017 CLINICAL DATA:  Initial evaluation for acute respiratory distress. EXAM: PORTABLE CHEST 1 VIEW COMPARISON:  Prior radiograph from 06/10/2016. FINDINGS: Cardiomegaly.  Mediastinal silhouette normal. Lungs hyperinflated with underlying emphysematous changes. Superimposed bibasilar interstitial prominence with scattered Kerley B-lines, suggesting mild pulmonary interstitial edema. Superimposed infection not excluded. Blunting of the right costophrenic angle, suggestive of small effusion. No pneumothorax. No acute osseus abnormality. IMPRESSION: 1. Bibasilar  interstitial congestion/prominence, suggesting mild pulmonary interstitial edema. Superimposed infection not excluded, and could be considered in the correct clinical setting. 2. Blunting of the right costophrenic angle, suggestive of small effusion. 3. Underlying emphysema. 4. Cardiomegaly. Electronically Signed   By: Jeannine Boga M.D.   On: 01/11/2017 21:02     CBC  Recent Labs Lab 01/11/17 2030 01/12/17 0350  WBC 13.6* 11.9*  HGB 16.0 14.4  HCT 48.1* 42.0  PLT 327 276  MCV 96.3 96.0  MCH 32.0 32.9  MCHC 33.2 34.2  RDW 14.4 13.7  LYMPHSABS 6.4*  --   MONOABS 0.7  --   EOSABS 1.1*  --   BASOSABS 0.2*  --     Chemistries   Recent Labs Lab 01/11/17 2030 01/12/17 0350  NA 132* 135  K 4.4 3.9  CL 97* 104  CO2 23 24  GLUCOSE 388* 256*  BUN 14 19  CREATININE 0.87 0.75  CALCIUM 9.3 8.2*   ------------------------------------------------------------------------------------------------------------------ estimated creatinine clearance is 44.8 mL/min (by C-G formula based on SCr of 0.75 mg/dL). ------------------------------------------------------------------------------------------------------------------ No results for input(s): HGBA1C in the last 72 hours. ------------------------------------------------------------------------------------------------------------------ No results for input(s): CHOL, HDL, LDLCALC, TRIG, CHOLHDL, LDLDIRECT in the last 72 hours. ------------------------------------------------------------------------------------------------------------------ No results for input(s): TSH, T4TOTAL, T3FREE, THYROIDAB in the last 72 hours.  Invalid input(s): FREET3 ------------------------------------------------------------------------------------------------------------------ No results for input(s): VITAMINB12, FOLATE, FERRITIN, TIBC, IRON, RETICCTPCT in the last 72 hours.  Coagulation profile No results for input(s): INR, PROTIME in the last 168  hours.  No results for input(s): DDIMER in the last 72 hours.  Cardiac Enzymes  Recent Labs Lab 01/12/17 0019 01/13/17 0227 01/13/17 0817  TROPONINI 0.54* 0.43* 0.34*   ------------------------------------------------------------------------------------------------------------------ Invalid input(s): POCBNP    Assessment & Plan  Patient's 83 year old with COPD with respiratory failure  1. Acute respiratory failure with hypercapnia (HCC) - Due to COPD exacerbation, As well as acute systolic CHF Continue therapy with antibiotics, nebulizers   2. Acute systolic CHF I have started patient on Lasix Cardiology consult has been ordered due to new onset of systolic dysfunction Elevated troponin suspected due to demand ischemia further recommendations per cardiology  3.    CAP (community acquired pneumonia) - IV antibiotics     4.  Essential hypertension - low blood pressure overnight patient had to be placed on Levophed Since she will receive low-dose IV Lasix I will place patient on midrodrine  5.  Diabetes mellitus without complication (HCC) - sliding scale insulin with corresponding glucose checks  6. Miscellaneous Lovenox for DVT prophylaxis     Code Status Orders        Start     Ordered   01/11/17 2248  Full code  Continuous     01/11/17 2248    Code Status History    Date Active Date Inactive Code Status Order ID Comments User Context   01/12/2016 10:12 AM 01/14/2016 12:46 AM  Full Code [id number]  Saundra Shelling, MD Inpatient           Consults  intensvist  DVT Prophylaxis  Lovenox   Lab Results  Component Value Date   PLT 276 01/12/2017     Time Spent in minutes  59min Greater than 50% of time spent in care coordination and counseling patient regarding the condition and plan of care.   Dustin Flock M.D on 01/13/2017 at 2:15 PM  Between 7am to 6pm - Pager - [phone]  After 6pm go to [email or web address] - password [email or web address] [place] [facility] [place]   Office  [phone]

## 2017-01-13 NOTE — Consult Note (Signed)
Cardiology Consult    Patient ID: Mary Howe MRN: [id number], DOB/AGE: [birth year]   Admit date: 01/11/2017 Date of Consult: 01/13/2017  Primary Physician: [name], MD Primary Cardiologist: New Requesting Provider: S. Patel  Patient Profile    Mary Howe is a 83 y.o. female with a history of COPD, diverticulitis, GERD, HL, and hypothyroidism, who is being seen today for the evaluation of newly dx cardiomyopathy in the setting of acute resp failure at the request of Dr. Serita Grit.  Past Medical History   Past Medical History:  Diagnosis Date  . COPD (chronic obstructive pulmonary disease) ([place])   . Diverticulitis 2015  . GERD (gastroesophageal reflux disease)   . Hyperlipidemia   . Thyroid disease     Past Surgical History:  Procedure Laterality Date  . CERVICAL CONE BIOPSY    . CHOLECYSTECTOMY    . HEMORROIDECTOMY       Allergies  Allergies  Allergen Reactions  . Simvastatin     Liver enzyme elevation    History of Present Illness    83 y/o ? with the above PMH including COPD, remote tob abuse, diverticulitis, GERD, HL, and hypothyroidism.  She was only dx with COPD about a year ago and sometimes has bronchitis flares that require abx.  She had been doing relatively well but over the past year, her activity level has dropped in the setting of compression fx and back pain.  Over last weekend, after visiting her sister in [place], she noted some resp congestion.  She was seen in PCP's office on Wed and required albuterol neb in office.  CXR showed mild interstitial edema, ? Superimposed infx.  She was rx levaquin, prednisone, and duoneb with plan for early f/u.  Unfortunately, later on Wednesday, her wheezing worsened and she called her dtr who went to her home and found her in significant distress. EMS was called.  Per report, pt lost consciousness @ some point en route.  Upon arrival to the ED, she was placed on BiPAP.  She was initially hypotensive and she was  admitted to ICU for resp support, IV abx, and vasopressor therapy.  Norepi weaned off on 10/4.  Trop peaked at 0.54. Pt improved but remains on BiPAP.  Echo 10/4 showed LV dysfxn with an EF of 25% to 30% and severe global hypokinesis, though basal regions are moving well - ? Stress induced CM.  Pt denies any prior h/o chest pain.  She has some degree of chronic DOE.  Inpatient Medications    . budesonide (PULMICORT) nebulizer solution  0.5 mg Nebulization BID  . chlorhexidine  15 mL Mouth Rinse BID  . enoxaparin (LOVENOX) injection  40 mg Subcutaneous Q24H  . escitalopram  10 mg Oral Daily  . furosemide  20 mg Intravenous Q12H  . insulin aspart  0-15 Units Subcutaneous TID WC  . insulin aspart  0-5 Units Subcutaneous QHS  . insulin aspart  3 Units Subcutaneous TID WC  . ipratropium-albuterol  3 mL Nebulization Q4H  . mouth rinse  15 mL Mouth Rinse q12n4p  . methylPREDNISolone (SOLU-MEDROL) injection  40 mg Intravenous Q12H  . midodrine  2.5 mg Oral TID WC  . pantoprazole  40 mg Oral Daily  . protein supplement shake  11 oz Oral BID BM  . valACYclovir  1,000 mg Oral BID    Family History    Family History  Problem Relation Age of Onset  . Prostate cancer Father   . Pneumonia Brother   .  Lung disease Brother   . CAD Brother   . Heart disease Brother   . CAD Brother   . Heart attack Brother   . Brain cancer Brother   . Bone cancer Sister   . Emphysema Sister   . Fibrocystic breast disease Sister   . Aneurysm Sister   . Heart disease Sister     Social History    Social History   Social History  . Marital status: Divorced    Spouse name: N/A  . Number of children: N/A  . Years of education: N/A   Occupational History  . retired    Social History Main Topics  . Smoking status: Former Research scientist (life sciences)  . Smokeless tobacco: Never Used     Comment: quit smoking in 1990  . Alcohol use 0.0 oz/week     Comment: OCCASIONALLY DRINKS WINE  . Drug use: No  . Sexual activity: No    Other Topics Concern  . Not on file   Social History Narrative   Moved from [place] to [place] in 2014, where she lives with her 29 y/o sister.  Dtr and family nearby.         Review of Systems    General:  No chills, fever, night sweats or weight changes.  Cardiovascular:  No chest pain, +++ dyspnea on exertion, no edema, orthopnea, palpitations, paroxysmal nocturnal dyspnea. +++ wheezing on admission. Dermatological: No rash, lesions/masses Respiratory: No cough, dyspnea Urologic: No hematuria, dysuria Abdominal:   No nausea, vomiting, diarrhea, bright red blood per rectum, melena, or hematemesis Neurologic:  No visual changes, wkns, changes in mental status. All other systems reviewed and are otherwise negative except as noted above.  Physical Exam    Blood pressure 118/66, pulse (!) 104, temperature 98 F (36.7 C), temperature source Oral, resp. rate 20, height 5\' 3"  (1.6 m), weight 131 lb 13.4 oz (59.8 kg), SpO2 97 %.  General: Pleasant, NAD Psych: Normal affect. Neuro: Alert and oriented X 3. Moves all extremities spontaneously. HEENT: Normal  Neck: Supple without bruits or JVD. Lungs:  Resp regular and unlabored, diminished breath sounds with bibasilar crackles. Heart: RRR no s3, s4, or murmurs. Abdomen: Soft, non-tender, non-distended, BS + x 4.  Extremities: No clubbing, cyanosis or edema. DP/PT/Radials 2+ and equal bilaterally.  Labs     Recent Labs  01/11/17 2030 01/12/17 0019 01/13/17 0227 01/13/17 0817  TROPONINI 0.10* 0.54* 0.43* 0.34*   Lab Results  Component Value Date   WBC 11.9 (H) 01/12/2017   HGB 14.4 01/12/2017   HCT 42.0 01/12/2017   MCV 96.0 01/12/2017   PLT 276 01/12/2017     Recent Labs Lab 01/12/17 0350  NA 135  K 3.9  CL 104  CO2 24  BUN 19  CREATININE 0.75  CALCIUM 8.2*  GLUCOSE 256*   Lab Results  Component Value Date   CHOL 188 12/14/2016   HDL 88.80 12/14/2016   LDLCALC 84 12/14/2016   TRIG 79.0 12/14/2016      Radiology Studies    Dg Chest 2 View  Result Date: 01/11/2017 CLINICAL DATA:  Cough.  History of COPD. EXAM: CHEST  2 VIEW COMPARISON:  None. FINDINGS: The cardiac silhouette is mildly enlarged. No mediastinal or hilar masses. No evidence of adenopathy. Lungs are hyperexpanded. Prominent bronchovascular markings are noted in the lung bases most evident in the lower lobes. There is a paucity of vascular markings in the upper lobes consistent with emphysema. No evidence of pneumonia or pulmonary edema. No  pleural effusion or pneumothorax. Skeletal structures are demineralized. There are mild compression fractures of four upper to midthoracic vertebra, likely old. IMPRESSION: 1. No acute cardiopulmonary disease. 2. COPD/emphysema. 3. Mild cardiomegaly. 4. Mild thoracic vertebral compression fractures, of unclear chronicity, but likely old if there is no current back pain. Electronically Signed   By: Lajean Manes M.D.   On: 01/11/2017 15:05   Dg Chest Port 1 View  Result Date: 01/13/2017 CLINICAL DATA:  Acute respiratory failure EXAM: PORTABLE CHEST 1 VIEW COMPARISON:  01/11/2017 FINDINGS: Cardiac shadow remains mildly enlarged. Tortuosity of thoracic aorta is noted. Diffuse interstitial changes are again identified and stable from the prior exam. No focal infiltrate is seen. No bony abnormality is noted. IMPRESSION: Diffuse interstitial changes stable from the previous exam. Electronically Signed   By: Inez Catalina M.D.   On: 01/13/2017 06:20   Dg Chest Portable 1 View  Result Date: 01/11/2017 CLINICAL DATA:  Initial evaluation for acute respiratory distress. EXAM: PORTABLE CHEST 1 VIEW COMPARISON:  Prior radiograph from 06/10/2016. FINDINGS: Cardiomegaly.  Mediastinal silhouette normal. Lungs hyperinflated with underlying emphysematous changes. Superimposed bibasilar interstitial prominence with scattered Kerley B-lines, suggesting mild pulmonary interstitial edema. Superimposed infection not excluded.  Blunting of the right costophrenic angle, suggestive of small effusion. No pneumothorax. No acute osseus abnormality. IMPRESSION: 1. Bibasilar interstitial congestion/prominence, suggesting mild pulmonary interstitial edema. Superimposed infection not excluded, and could be considered in the correct clinical setting. 2. Blunting of the right costophrenic angle, suggestive of small effusion. 3. Underlying emphysema. 4. Cardiomegaly. Electronically Signed   By: Jeannine Boga M.D.   On: 01/11/2017 21:02    ECG & Cardiac Imaging    Sinus tachycardia, 141, no acute st/t changes.  2D Echocardiogram 10.4.2018  Study Conclusions   - Left ventricle: The cavity size was normal. Systolic function was   severely reduced. The estimated ejection fraction was in the   range of 25% to 30%. Severe global hypokinesis, basal regions   moving well. Doppler parameters are consistent with abnormal left   ventricular relaxation (grade 1 diastolic dysfunction). - Mitral valve: There was mild regurgitation. - Left atrium: The atrium was mildly dilated. - Right ventricle: Systolic function was normal. - Tricuspid valve: There was mild-moderate regurgitation. - Pulmonary arteries: Systolic pressure was moderately elevated. PA   peak pressure: 55 mm Hg (S).   Impressions:   - Images concerning for stress cardiomyopathy given basal wall   regions are moving well with otherwise severe global hypokinesis.   Assessment & Plan    1.  Acute on chronic hypoxic resp failure/AECOPD: still req BiPAP.  Abx, steroids, inhalers per CCM.  2.  Cardiomyopathy:  In setting of above.  ? Ischemic vs non-ischemic.  EF 25-35% with sev glob HK, though basal regions moving well - ? takotsubo variant.  Just came off of pressors yesterday.  Currently on midodrine and bp soft - 1-teens.  Will hold off on  blocker and ARB for now.  Bibasilar crackles on exam. PASP 55 on echo.  Cont IV lasix.  Will need ischemic eval prior to  discharge pending resp recovery.  3.  Demand Ischemia/elevated troponin:  In the setting of above, pt with rise in trop - 0.10  0.54  0.43  0.34.  No prior h/o chest pain. She did c/o upper back and bilat arm pain in the setting of wheezing and resp distress on the night of admission per dtr. As above, LV dysfxn noted on echo and therefor will require  ischemic eval - ? Stress vs cath.  Will add asa/statin (prev intol to simva).  Signed, Murray Hodgkins, NP 01/13/2017, 12:07 PM  For questions or updates, please contact   Please consult [email or web address] for contact info under Cardiology/STEMI.

## 2017-01-13 NOTE — Progress Notes (Signed)
Pnt currently resting and appears comfortable. Bipap on and sats 100%. Patient alert and oriented x4. No issues or concerns at this time. Will continue to monitor and assess.

## 2017-01-13 NOTE — Progress Notes (Signed)
[facility] Progess Note    SYNOPSIS   83 yo female admitted 10/3 with acute on chronic hypoxic hypercapnic respiratory failure secondary to atypical pulmonary infection vs. pulmonary interstitial edema and AECOPD requiring continuous Bipap, acute encephalopathy likely secondary to hypercapnia, and sepsis with hypotension requiring levophed gtt     ASSESSMENT/PLAN   Acute on chronic hypercapnic respiratory failure. Presently on azithromycin, Rocephin, albuterol, Atrovent, Solu-Medrol, weaned off of BiPAP, doing well this morning. Will monitor patient's blood pressure as she was briefly on norepinephrine and if her hemodynamics remain stable will transfer to the floor.  Mildly elevated troponin. Most likely reflects supply demand ischemia. Peak troponin at 0.43  Hyperglycemia, on coverage  Mild leukocytosis  Elevated lactic acid yesterday at 3.3. Stable hemodynamics this morning  Name: Mary Howe MRN: [id number] DOB: 1933-02-09    ADMISSION DATE:  01/11/2017   SUBJECTIVE:   Patient doing well this morning, states shortness of breath significantly improved, has been weaned off of norepinephrine and BiPAP  VITAL SIGNS: Temp:  [98 F (36.7 C)] 98 F (36.7 C) (10/05 0800) Pulse Rate:  [88-127] 104 (10/05 0814) Resp:  [14-31] 29 (10/05 0814) BP: (84-118)/(51-81) 111/57 (10/05 0800) SpO2:  [87 %-99 %] 91 % (10/05 0814) FiO2 (%):  [40 %] 40 % (10/05 0100)  PHYSICAL EXAMINATION: Physical Examination:   VS: BP (!) 111/57   Pulse (!) 104   Temp 98 F (36.7 C) (Oral)   Resp (!) 29   Ht 5\' 3"  (1.6 m)   Wt 59.8 kg (131 lb 13.4 oz)   SpO2 91%   BMI 23.35 kg/m   General Appearance: No distress  Neuro:without focal findings, mental status normal. HEENT: PERRLA, EOM intact. Pulmonary: Diminished breath sounds, prolonged expiratory phase, diffuse wheezing noted CardiovascularNormal S1,S2.  No m/r/g.   Abdomen: Benign, Soft, non-tender. Skin:   warm,  no rashes, no ecchymosis  Extremities: normal, no cyanosis, clubbing.    LABORATORY PANEL:   CBC  Recent Labs Lab 01/12/17 0350  WBC 11.9*  HGB 14.4  HCT 42.0  PLT 276    Chemistries   Recent Labs Lab 01/12/17 0350  NA 135  K 3.9  CL 104  CO2 24  GLUCOSE 256*  BUN 19  CREATININE 0.75  CALCIUM 8.2*     Recent Labs Lab 01/12/17 0812 01/12/17 1105 01/12/17 1239 01/12/17 1743 01/12/17 2134 01/13/17 0737  GLUCAP 232* 204* 191* 118* 174* 144*    Recent Labs Lab 01/11/17 2209  PHART 7.21*  PCO2ART 48  PO2ART 155*   No results for input(s): AST, ALT, ALKPHOS, BILITOT, ALBUMIN in the last 168 hours.  Cardiac Enzymes  Recent Labs Lab 01/13/17 0817  TROPONINI 0.34*    RADIOLOGY:  Dg Chest 2 View  Result Date: 01/11/2017 CLINICAL DATA:  Cough.  History of COPD. EXAM: CHEST  2 VIEW COMPARISON:  None. FINDINGS: The cardiac silhouette is mildly enlarged. No mediastinal or hilar masses. No evidence of adenopathy. Lungs are hyperexpanded. Prominent bronchovascular markings are noted in the lung bases most evident in the lower lobes. There is a paucity of vascular markings in the upper lobes consistent with emphysema. No evidence of pneumonia or pulmonary edema. No pleural effusion or pneumothorax. Skeletal structures are demineralized. There are mild compression fractures of four upper to midthoracic vertebra, likely old. IMPRESSION: 1. No acute cardiopulmonary disease. 2. COPD/emphysema. 3. Mild cardiomegaly. 4. Mild thoracic vertebral compression fractures, of unclear chronicity, but likely old if there is no current back  pain. Electronically Signed   By: Lajean Manes M.D.   On: 01/11/2017 15:05   Dg Chest Port 1 View  Result Date: 01/13/2017 CLINICAL DATA:  Acute respiratory failure EXAM: PORTABLE CHEST 1 VIEW COMPARISON:  01/11/2017 FINDINGS: Cardiac shadow remains mildly enlarged. Tortuosity of thoracic aorta is noted. Diffuse interstitial changes are again  identified and stable from the prior exam. No focal infiltrate is seen. No bony abnormality is noted. IMPRESSION: Diffuse interstitial changes stable from the previous exam. Electronically Signed   By: Inez Catalina M.D.   On: 01/13/2017 06:20   Dg Chest Portable 1 View  Result Date: 01/11/2017 CLINICAL DATA:  Initial evaluation for acute respiratory distress. EXAM: PORTABLE CHEST 1 VIEW COMPARISON:  Prior radiograph from 06/10/2016. FINDINGS: Cardiomegaly.  Mediastinal silhouette normal. Lungs hyperinflated with underlying emphysematous changes. Superimposed bibasilar interstitial prominence with scattered Kerley B-lines, suggesting mild pulmonary interstitial edema. Superimposed infection not excluded. Blunting of the right costophrenic angle, suggestive of small effusion. No pneumothorax. No acute osseus abnormality. IMPRESSION: 1. Bibasilar interstitial congestion/prominence, suggesting mild pulmonary interstitial edema. Superimposed infection not excluded, and could be considered in the correct clinical setting. 2. Blunting of the right costophrenic angle, suggestive of small effusion. 3. Underlying emphysema. 4. Cardiomegaly. Electronically Signed   By: Jeannine Boga M.D.   On: 01/11/2017 21:02    Hermelinda Dellen, DO 01/13/2017

## 2017-01-14 DIAGNOSIS — I5041 Acute combined systolic (congestive) and diastolic (congestive) heart failure: Secondary | ICD-10-CM

## 2017-01-14 DIAGNOSIS — R0603 Acute respiratory distress: Secondary | ICD-10-CM

## 2017-01-14 LAB — BASIC METABOLIC PANEL
ANION GAP: 9 (ref 5–15)
BUN: 15 mg/dL (ref 6–20)
CO2: 27 mmol/L (ref 22–32)
Calcium: 8.5 mg/dL — ABNORMAL LOW (ref 8.9–10.3)
Chloride: 96 mmol/L — ABNORMAL LOW (ref 101–111)
Creatinine, Ser: 0.5 mg/dL (ref 0.44–1.00)
GFR calc Af Amer: >60 mL/min (ref >60)
Glucose, Bld: 125 mg/dL — ABNORMAL HIGH (ref 65–99)
POTASSIUM: 3.9 mmol/L (ref 3.5–5.1)
SODIUM: 132 mmol/L — AB (ref 135–145)

## 2017-01-14 LAB — CBC
HEMATOCRIT: 39.5 % (ref 35.0–47.0)
HEMOGLOBIN: 13.4 g/dL (ref 12.0–16.0)
MCH: 32.5 pg (ref 26.0–34.0)
MCHC: 34 g/dL (ref 32.0–36.0)
MCV: 95.5 fL (ref 80.0–100.0)
Platelets: 196 10*3/uL (ref 150–440)
RBC: 4.13 MIL/uL (ref 3.80–5.20)
RDW: 13.9 % (ref 11.5–14.5)
WBC: 12 10*3/uL — AB (ref 3.6–11.0)

## 2017-01-14 LAB — GLUCOSE, CAPILLARY
GLUCOSE-CAPILLARY: 122 mg/dL — AB (ref 65–99)
Glucose-Capillary: 121 mg/dL — ABNORMAL HIGH (ref 65–99)
Glucose-Capillary: 87 mg/dL (ref 65–99)
Glucose-Capillary: 154 mg/dL — ABNORMAL HIGH (ref 65–99)

## 2017-01-14 LAB — MAGNESIUM: Magnesium: 1.8 mg/dL (ref 1.7–2.4)

## 2017-01-14 LAB — PROCALCITONIN: PROCALCITONIN: 0.23 ng/mL

## 2017-01-14 LAB — PHOSPHORUS: PHOSPHORUS: 2.3 mg/dL — AB (ref 2.5–4.6)

## 2017-01-14 MED ORDER — FUROSEMIDE 10 MG/ML IJ SOLN
40.0000 mg | Freq: Two times a day (BID) | INTRAMUSCULAR | Status: DC
  Administered 2017-01-14: 40 mg via INTRAVENOUS
  Filled 2017-01-14: qty 4

## 2017-01-14 MED ORDER — BISOPROLOL FUMARATE 5 MG PO TABS
2.5000 mg | ORAL_TABLET | Freq: Every day | ORAL | Status: DC
  Administered 2017-01-14 – 2017-01-18 (×4): 2.5 mg via ORAL
  Filled 2017-01-14 (×5): qty 0.5

## 2017-01-14 MED ORDER — FUROSEMIDE 10 MG/ML IJ SOLN
20.0000 mg | Freq: Two times a day (BID) | INTRAMUSCULAR | Status: DC
  Administered 2017-01-14 – 2017-01-15 (×2): 20 mg via INTRAVENOUS
  Filled 2017-01-14 (×2): qty 2

## 2017-01-14 MED ORDER — SACUBITRIL-VALSARTAN 24-26 MG PO TABS
1.0000 | ORAL_TABLET | Freq: Two times a day (BID) | ORAL | Status: DC
  Administered 2017-01-14 (×2): 1 via ORAL
  Filled 2017-01-14 (×3): qty 1

## 2017-01-14 NOTE — Progress Notes (Signed)
[place] at [facility]                                                                                                                                                                                  Patient Demographics   Mary Howe, is a 83 y.o. female, DOB - September 28, 1932, GUY:[id number]  Admit date - 01/11/2017   Admitting Physician Lance Coon, MD  Outpatient Primary MD for the patient is [name], MD   LOS - 3  Subjective: Pt breathing improved no cp    Review of Systems:   CONSTITUTIONAL: No documented fever. No fatigue, weakness. No weight gain, no weight loss.  EYES: No blurry or double vision.  ENT: No tinnitus. No postnasal drip. No redness of the oropharynx.  RESPIRATORY:  positive cough, no wheeze, no hemoptysis. Positive dyspnea.  CARDIOVASCULAR: No chest pain. No orthopnea. No palpitations. No syncope.  GASTROINTESTINAL: No nausea, no vomiting or diarrhea. No abdominal pain. No melena or hematochezia.  GENITOURINARY: No dysuria or hematuria.  ENDOCRINE: No polyuria or nocturia. No heat or cold intolerance.  HEMATOLOGY: No anemia. No bruising. No bleeding.  INTEGUMENTARY: No rashes. No lesions.  MUSCULOSKELETAL: No arthritis. No swelling. No gout.  NEUROLOGIC: No numbness, tingling, or ataxia. No seizure-type activity.  PSYCHIATRIC: No anxiety. No insomnia. No ADD.    Vitals:   Vitals:   01/14/17 0817 01/14/17 0900 01/14/17 1000 01/14/17 1100  BP:  (!) 150/91 130/62 123/64  Pulse:  (!) 109 (!) 110 (!) 109  Resp:  18 (!) 24 (!) 26  Temp:      TempSrc:      SpO2: 94% 94% 97% 96%  Weight:      Height:        Wt Readings from Last 3 Encounters:  01/12/17 131 lb 13.4 oz (59.8 kg)  01/11/17 126 lb 9.6 oz (57.4 kg)  12/14/16 128 lb 6.4 oz (58.2 kg)     Intake/Output Summary (Last 24 hours) at 01/14/17 1421 Last data filed at 01/14/17 1243  Gross per 24 hour  Intake              550 ml  Output             4000 ml   Net            -3450 ml    Physical Exam:   GENERAL:Mild respiratory distress HEAD, EYES, EARS, NOSE AND THROAT: Atraumatic, normocephalic. Extraocular muscles are intact. Pupils equal and reactive to light. Sclerae anicteric. No conjunctival injection. No oro-pharyngeal erythema.  NECK: Supple. There is no jugular venous distention. No bruits, no lymphadenopathy, no thyromegaly.  HEART: Regular  rate and rhythm,. No murmurs, no rubs, no clicks.  LUNGS: Bilateral wheezing throughout both lungs ABDOMEN: Soft, flat, nontender, nondistended. Has good bowel sounds. No hepatosplenomegaly appreciated.  EXTREMITIES: No evidence of any cyanosis, clubbing, or peripheral edema.  +2 pedal and radial pulses bilaterally.  NEUROLOGIC: The patient is alert, awake, and oriented x3 with no focal motor or sensory deficits appreciated bilaterally.  SKIN: Moist and warm with no rashes appreciated.  Psych: Not anxious, depressed LN: No inguinal LN enlargement    Antibiotics   Anti-infectives    Start     Dose/Rate Route Frequency Ordered Stop   01/14/17 1800  cefTRIAXone (ROCEPHIN) 1 g in dextrose 5 % 50 mL IVPB     1 g 100 mL/hr over 30 Minutes Intravenous Every 24 hours 01/13/17 2242     01/12/17 1800  azithromycin (ZITHROMAX) 500 mg in dextrose 5 % 250 mL IVPB     500 mg 250 mL/hr over 60 Minutes Intravenous Every 24 hours 01/11/17 2203     01/12/17 1700  cefTRIAXone (ROCEPHIN) 1 g in dextrose 5 % 50 mL IVPB  Status:  Discontinued     1 g 100 mL/hr over 30 Minutes Intravenous Every 24 hours 01/11/17 2203 01/13/17 2242   01/11/17 2300  valACYclovir (VALTREX) tablet 1,000 mg     1,000 mg Oral 2 times daily 01/11/17 2248     01/11/17 2145  cefTRIAXone (ROCEPHIN) 1 g in dextrose 5 % 50 mL IVPB - Premix  Status:  Discontinued     1 g 100 mL/hr over 30 Minutes Intravenous  Once 01/11/17 2133 01/13/17 2241   01/11/17 2145  azithromycin (ZITHROMAX) 500 mg in dextrose 5 % 250 mL IVPB     500 mg 250 mL/hr  over 60 Minutes Intravenous  Once 01/11/17 2133 01/12/17 0027      Medications   Scheduled Meds: . aspirin  81 mg Oral Daily  . atorvastatin  40 mg Oral q1800  . budesonide (PULMICORT) nebulizer solution  0.5 mg Nebulization BID  . chlorhexidine  15 mL Mouth Rinse BID  . enoxaparin (LOVENOX) injection  40 mg Subcutaneous Q24H  . escitalopram  10 mg Oral Daily  . furosemide  40 mg Intravenous BID  . insulin aspart  0-15 Units Subcutaneous TID WC  . insulin aspart  0-5 Units Subcutaneous QHS  . insulin aspart  3 Units Subcutaneous TID WC  . ipratropium-albuterol  3 mL Nebulization Q4H  . mouth rinse  15 mL Mouth Rinse q12n4p  . methylPREDNISolone (SOLU-MEDROL) injection  40 mg Intravenous Q12H  . pantoprazole  40 mg Oral Daily  . protein supplement shake  11 oz Oral BID BM  . sacubitril-valsartan  1 tablet Oral BID  . valACYclovir  1,000 mg Oral BID   Continuous Infusions: . azithromycin Stopped (01/13/17 1915)  . cefTRIAXone (ROCEPHIN) IVPB 1 gram/50 mL D5W    . norepinephrine (LEVOPHED) Adult infusion Stopped (01/12/17 1413)   PRN Meds:.acetaminophen **OR** acetaminophen, ALPRAZolam, guaiFENesin, ipratropium-albuterol, ondansetron **OR** ondansetron (ZOFRAN) IV   Data Review:   Micro Results Recent Results (from the past 240 hour(s))  Culture, blood (routine x 2)     Status: None (Preliminary result)   Collection Time: 01/11/17 10:09 PM  Result Value Ref Range Status   Specimen Description BLOOD RFOA  Final   Special Requests   Final    BOTTLES DRAWN AEROBIC AND ANAEROBIC Blood Culture adequate volume   Culture NO GROWTH 3 DAYS  Final   Report Status  PENDING  Incomplete  Culture, blood (routine x 2)     Status: None (Preliminary result)   Collection Time: 01/11/17 10:09 PM  Result Value Ref Range Status   Specimen Description BLOOD LFOA  Final   Special Requests   Final    BOTTLES DRAWN AEROBIC AND ANAEROBIC Blood Culture adequate volume   Culture NO GROWTH 3 DAYS   Final   Report Status PENDING  Incomplete  MRSA PCR Screening     Status: None   Collection Time: 01/12/17 12:08 AM  Result Value Ref Range Status   MRSA by PCR NEGATIVE NEGATIVE Final    Comment:        The GeneXpert MRSA Assay (FDA approved for NASAL specimens only), is one component of a comprehensive MRSA colonization surveillance program. It is not intended to diagnose MRSA infection nor to guide or monitor treatment for MRSA infections.   Respiratory Panel by PCR     Status: None   Collection Time: 01/12/17  9:00 AM  Result Value Ref Range Status   Adenovirus NOT DETECTED NOT DETECTED Final   Coronavirus 229E NOT DETECTED NOT DETECTED Final   Coronavirus HKU1 NOT DETECTED NOT DETECTED Final   Coronavirus NL63 NOT DETECTED NOT DETECTED Final   Coronavirus OC43 NOT DETECTED NOT DETECTED Final   Metapneumovirus NOT DETECTED NOT DETECTED Final   Rhinovirus / Enterovirus NOT DETECTED NOT DETECTED Final   Influenza A NOT DETECTED NOT DETECTED Final   Influenza B NOT DETECTED NOT DETECTED Final   Parainfluenza Virus 1 NOT DETECTED NOT DETECTED Final   Parainfluenza Virus 2 NOT DETECTED NOT DETECTED Final   Parainfluenza Virus 3 NOT DETECTED NOT DETECTED Final   Parainfluenza Virus 4 NOT DETECTED NOT DETECTED Final   Respiratory Syncytial Virus NOT DETECTED NOT DETECTED Final   Bordetella pertussis NOT DETECTED NOT DETECTED Final   Chlamydophila pneumoniae NOT DETECTED NOT DETECTED Final   Mycoplasma pneumoniae NOT DETECTED NOT DETECTED Final    Comment: Performed at [facility], [place] [address], [place], [place] [address]  Culture, Urine     Status: Abnormal   Collection Time: 01/12/17 10:13 AM  Result Value Ref Range Status   Specimen Description URINE, RANDOM  Final   Special Requests NONE  Final   Culture MULTIPLE SPECIES PRESENT, SUGGEST RECOLLECTION (A)  Final   Report Status 01/13/2017 FINAL  Final    Radiology Reports Dg Chest 2 View  Result Date:  01/11/2017 CLINICAL DATA:  Cough.  History of COPD. EXAM: CHEST  2 VIEW COMPARISON:  None. FINDINGS: The cardiac silhouette is mildly enlarged. No mediastinal or hilar masses. No evidence of adenopathy. Lungs are hyperexpanded. Prominent bronchovascular markings are noted in the lung bases most evident in the lower lobes. There is a paucity of vascular markings in the upper lobes consistent with emphysema. No evidence of pneumonia or pulmonary edema. No pleural effusion or pneumothorax. Skeletal structures are demineralized. There are mild compression fractures of four upper to midthoracic vertebra, likely old. IMPRESSION: 1. No acute cardiopulmonary disease. 2. COPD/emphysema. 3. Mild cardiomegaly. 4. Mild thoracic vertebral compression fractures, of unclear chronicity, but likely old if there is no current back pain. Electronically Signed   By: Lajean Manes M.D.   On: 01/11/2017 15:05   Dg Chest Port 1 View  Result Date: 01/13/2017 CLINICAL DATA:  Acute respiratory failure EXAM: PORTABLE CHEST 1 VIEW COMPARISON:  01/11/2017 FINDINGS: Cardiac shadow remains mildly enlarged. Tortuosity of thoracic aorta is noted. Diffuse interstitial changes  are again identified and stable from the prior exam. No focal infiltrate is seen. No bony abnormality is noted. IMPRESSION: Diffuse interstitial changes stable from the previous exam. Electronically Signed   By: Inez Catalina M.D.   On: 01/13/2017 06:20   Dg Chest Portable 1 View  Result Date: 01/11/2017 CLINICAL DATA:  Initial evaluation for acute respiratory distress. EXAM: PORTABLE CHEST 1 VIEW COMPARISON:  Prior radiograph from 06/10/2016. FINDINGS: Cardiomegaly.  Mediastinal silhouette normal. Lungs hyperinflated with underlying emphysematous changes. Superimposed bibasilar interstitial prominence with scattered Kerley B-lines, suggesting mild pulmonary interstitial edema. Superimposed infection not excluded. Blunting of the right costophrenic angle, suggestive of  small effusion. No pneumothorax. No acute osseus abnormality. IMPRESSION: 1. Bibasilar interstitial congestion/prominence, suggesting mild pulmonary interstitial edema. Superimposed infection not excluded, and could be considered in the correct clinical setting. 2. Blunting of the right costophrenic angle, suggestive of small effusion. 3. Underlying emphysema. 4. Cardiomegaly. Electronically Signed   By: Jeannine Boga M.D.   On: 01/11/2017 21:02     CBC  Recent Labs Lab 01/11/17 2030 01/12/17 0350 01/14/17 0536  WBC 13.6* 11.9* 12.0*  HGB 16.0 14.4 13.4  HCT 48.1* 42.0 39.5  PLT 327 276 196  MCV 96.3 96.0 95.5  MCH 32.0 32.9 32.5  MCHC 33.2 34.2 34.0  RDW 14.4 13.7 13.9  LYMPHSABS 6.4*  --   --   MONOABS 0.7  --   --   EOSABS 1.1*  --   --   BASOSABS 0.2*  --   --     Chemistries   Recent Labs Lab 01/11/17 2030 01/12/17 0350 01/14/17 0536  NA 132* 135 132*  K 4.4 3.9 3.9  CL 97* 104 96*  CO2 23 24 27   GLUCOSE 388* 256* 125*  BUN 14 19 15   CREATININE 0.87 0.75 0.50  CALCIUM 9.3 8.2* 8.5*  MG  --   --  1.8   ------------------------------------------------------------------------------------------------------------------ estimated creatinine clearance is 44.8 mL/min (by C-G formula based on SCr of 0.5 mg/dL). ------------------------------------------------------------------------------------------------------------------ No results for input(s): HGBA1C in the last 72 hours. ------------------------------------------------------------------------------------------------------------------ No results for input(s): CHOL, HDL, LDLCALC, TRIG, CHOLHDL, LDLDIRECT in the last 72 hours. ------------------------------------------------------------------------------------------------------------------ No results for input(s): TSH, T4TOTAL, T3FREE, THYROIDAB in the last 72 hours.  Invalid input(s):  FREET3 ------------------------------------------------------------------------------------------------------------------ No results for input(s): VITAMINB12, FOLATE, FERRITIN, TIBC, IRON, RETICCTPCT in the last 72 hours.  Coagulation profile No results for input(s): INR, PROTIME in the last 168 hours.  No results for input(s): DDIMER in the last 72 hours.  Cardiac Enzymes  Recent Labs Lab 01/12/17 0019 01/13/17 0227 01/13/17 0817  TROPONINI 0.54* 0.43* 0.34*   ------------------------------------------------------------------------------------------------------------------ Invalid input(s): POCBNP    Assessment & Plan  Patient's 83 year old with COPD with respiratory failure  1. Acute respiratory failure with hypercapnia (HCC) - Due to COPD exacerbation, As well as acute systolic CHF Continue therapy with antibiotics, nebulizer Extra lasix given by cards today   2. Acute systolic CHF Continue lasix, we can add ace and beta blocker once bp stable  3.    CAP (community acquired pneumonia) - IV antibiotics     4.  Essential hypertension - low blood pressure overnight patient had to be placed on Levophed Since she will receive low-dose IV Lasix I will place patient on midrodrine  5.  Diabetes mellitus without complication (HCC) - sliding scale insulin with corresponding glucose checks  6. Miscellaneous Lovenox for DVT prophylaxis     Code Status Orders  Start     Ordered   01/11/17 2248  Full code  Continuous     01/11/17 2248    Code Status History    Date Active Date Inactive Code Status Order ID Comments User Context   01/12/2016 10:12 AM 01/14/2016 12:46 AM Full Code [id number]  Saundra Shelling, MD Inpatient           Consults  intensvist  DVT Prophylaxis  Lovenox   Lab Results  Component Value Date   PLT 196 01/14/2017     Time Spent in minutes  56min Greater than 50% of time spent in care coordination and counseling patient regarding the  condition and plan of care.   Dustin Flock M.D on 01/14/2017 at 2:21 PM  Between 7am to 6pm - Pager - [phone]  After 6pm go to [email or web address] - password [email or web address] [facility]   Office  [phone]

## 2017-01-14 NOTE — Progress Notes (Signed)
Pt care assumed at this time.  Report received from [place].  No questions at this time.  Pt resting on Bipap.  NAD noted.

## 2017-01-14 NOTE — Progress Notes (Signed)
Progress Note  Patient Name: Mary Howe Date of Encounter: 01/14/2017  Primary Cardiologist: [facility] - [facility]  Subjective   Breathing a little better this morning but still short of breath and weak. Was able to sleep with HOB at 30 degrees last night. No chest pain. No edema.  Patient off norepinephrine. She received one dose of midodrine yesterday morning.  Inpatient Medications    Scheduled Meds: . aspirin  81 mg Oral Daily  . atorvastatin  40 mg Oral q1800  . budesonide (PULMICORT) nebulizer solution  0.5 mg Nebulization BID  . chlorhexidine  15 mL Mouth Rinse BID  . enoxaparin (LOVENOX) injection  40 mg Subcutaneous Q24H  . escitalopram  10 mg Oral Daily  . furosemide  40 mg Intravenous BID  . insulin aspart  0-15 Units Subcutaneous TID WC  . insulin aspart  0-5 Units Subcutaneous QHS  . insulin aspart  3 Units Subcutaneous TID WC  . ipratropium-albuterol  3 mL Nebulization Q4H  . mouth rinse  15 mL Mouth Rinse q12n4p  . methylPREDNISolone (SOLU-MEDROL) injection  40 mg Intravenous Q12H  . pantoprazole  40 mg Oral Daily  . protein supplement shake  11 oz Oral BID BM  . valACYclovir  1,000 mg Oral BID   Continuous Infusions: . azithromycin Stopped (01/13/17 1915)  . cefTRIAXone (ROCEPHIN) IVPB 1 gram/50 mL D5W    . norepinephrine (LEVOPHED) Adult infusion Stopped (01/12/17 1413)   PRN Meds: acetaminophen **OR** acetaminophen, ALPRAZolam, guaiFENesin, ipratropium-albuterol, ondansetron **OR** ondansetron (ZOFRAN) IV   Vital Signs    Vitals:   01/14/17 0400 01/14/17 0500 01/14/17 0600 01/14/17 0817  BP: (!) 97/41 (!) 99/42 (!) 96/51   Pulse: 68 83 74   Resp: 18 16 17    Temp:      TempSrc:      SpO2: 99% 96% 99% 94%  Weight:      Height:        Intake/Output Summary (Last 24 hours) at 01/14/17 0900 Last data filed at 01/14/17 0230  Gross per 24 hour  Intake              550 ml  Output             3950 ml  Net            -3400 ml   Filed Weights   01/11/17 2034 01/12/17 0017  Weight: 126 lb (57.2 kg) 131 lb 13.4 oz (59.8 kg)    Telemetry    NSR and sinus tach (HR 70-130 bpm) with occasional PVCs - Personally Reviewed  ECG    No new tracing  Physical Exam   GEN: No acute distress. Seated in bed.  Neck: JVP ~10 cm with + HJR Cardiac: Tachycardic but regular, no murmurs. Respiratory: Faint wheezes in left upper lung. Bibasilar crackles, L>R GI: Soft, nontender, non-distended  MS: No edema; No deformity. Neuro:  Nonfocal  Psych: Normal affect   Labs    Chemistry Recent Labs Lab 01/11/17 2030 01/12/17 0350 01/14/17 0536  NA 132* 135 132*  K 4.4 3.9 3.9  CL 97* 104 96*  CO2 23 24 27   GLUCOSE 388* 256* 125*  BUN 14 19 15   CREATININE 0.87 0.75 0.50  CALCIUM 9.3 8.2* 8.5*  GFRNONAA >60 >60 >60  GFRAA >60 >60 >60  ANIONGAP 12 7 9      Hematology Recent Labs Lab 01/11/17 2030 01/12/17 0350 01/14/17 0536  WBC 13.6* 11.9* 12.0*  RBC 4.99 4.38 4.13  HGB 16.0  14.4 13.4  HCT 48.1* 42.0 39.5  MCV 96.3 96.0 95.5  MCH 32.0 32.9 32.5  MCHC 33.2 34.2 34.0  RDW 14.4 13.7 13.9  PLT 327 276 196    Cardiac Enzymes Recent Labs Lab 01/11/17 2030 01/12/17 0019 01/13/17 0227 01/13/17 0817  TROPONINI 0.10* 0.54* 0.43* 0.34*   No results for input(s): TROPIPOC in the last 168 hours.   BNPNo results for input(s): BNP, PROBNP in the last 168 hours.   DDimer No results for input(s): DDIMER in the last 168 hours.   Radiology    Dg Chest Port 1 View  Result Date: 01/13/2017 CLINICAL DATA:  Acute respiratory failure EXAM: PORTABLE CHEST 1 VIEW COMPARISON:  01/11/2017 FINDINGS: Cardiac shadow remains mildly enlarged. Tortuosity of thoracic aorta is noted. Diffuse interstitial changes are again identified and stable from the prior exam. No focal infiltrate is seen. No bony abnormality is noted. IMPRESSION: Diffuse interstitial changes stable from the previous exam. Electronically Signed   By: Inez Catalina M.D.   On:  01/13/2017 06:20    Cardiac Studies   Echo (01/12/17): Normal LV size with LVEF 20-25% with preserved contraction of the basal segments and otherwise marked hypokinesis. Grade 1 diastolic dysfunction. Mild MR. Mild LAE. Normal RV size and function. Mild to moderate TR with moderate PH (RVSP 55 mmHg). - Images personally reviewed.  Patient Profile     83 y.o. female with history of COPD, HLD, hypothyroidism, diverticulitis, and GERD, admitted with acute respiratory failure and newly discovered cardiomyopathy with severe LV dysfunction.  Assessment & Plan    Acute on chronic hypoxic respiratory failure This is likely multifactorial, including AECOPD and acute systolic and diastolic heart failure. She is off BiPAP and seems to be tolerating nasal cannula well.  Continue treatment of AECOPD per primary service. Steroids should be weaned as quickly as possible, given propensity for fluid retention.  Management of acute systolic and diastolic heart failure, as below.  Acute systolic and diastolic heart failure Echo shows severely reduced LVEF; wall motion abnormality suggests Takotsubo, though certainly multivessel ischemia could also cause this pattern. Mary Howe appears at least mildly volume overloaded. I&O's indicate that she was negative 3.4L negative, though only limited input is recorded.  Give Lasix 40 mg IV this morning, then continue 20 mg BID thereafter.  Hold off on adding ACEI/ARB and BB this morning given soft BP overnight. If BP is reasonable this afternoon, I would recommend adding bisoprolol 2.5 mg daily.  Once patient is clinically euvolemic and breathing comfortably when flat, she will need left and right heart catheterization.  Elevated troponin I suspect this is most likely demand ischemia rather than acute plaque rupture MI.  Continue treatment of heart failure, as above.  Continue ASA and atorvastatin.  Cardiac catheterization before discharge.     Signed, Nelva Bush, MD  01/14/2017, 9:00 AM

## 2017-01-14 NOTE — Progress Notes (Signed)
  [facility] Progess Note    SYNOPSIS   83 yo female admitted 10/3 with acute on chronic hypoxic hypercapnic respiratory failure secondary to atypical pulmonary infection vs. pulmonary interstitial edema and AECOPD requiring continuous Bipap, acute encephalopathy likely secondary to hypercapnia, and sepsis with hypotension requiring levophed gtt     ASSESSMENT/PLAN   Acute on chronic hypercapnic respiratory failure. Presently on azithromycin, Rocephin, albuterol, Atrovent, Solu-Medrol, weaned off of BiPAP, doing well this morning. Will monitor patient's blood pressure as she was briefly on norepinephrine and if her hemodynamics remain stable will transfer to the floor.  Mildly elevated troponin. Most likely reflects supply demand ischemia. Peak troponin at 0.43. Echocardiogram noted. [facility] cardiology consultation, patient with ejection fraction of 25-30% with mild mitral regurgitation, mild-to-moderate tricuspid regurgitation and pulmonary hypertension. Will add Entresto  Hyperglycemia, on coverage  Mild leukocytosis. Patient on azithromycin and Rocephin empirically   Name: Mary Howe MRN: [id number] DOB: 03-Sep-1932    ADMISSION DATE:  01/11/2017   SUBJECTIVE:   Patient doing well this morning, states shortness of breath significantly improved, has been weaned off of norepinephrine and BiPAP  VITAL SIGNS: Temp:  [97 F (36.1 C)-98.3 F (36.8 C)] 97 F (36.1 C) (10/06 0200) Pulse Rate:  [68-111] 109 (10/06 0900) Resp:  [15-27] 18 (10/06 0900) BP: (96-165)/(41-91) 150/91 (10/06 0900) SpO2:  [86 %-99 %] 94 % (10/06 0900) FiO2 (%):  [32 %] 32 % (10/06 0817)  PHYSICAL EXAMINATION: Physical Examination:   VS: BP (!) 150/91   Pulse (!) 109   Temp (!) 97 F (36.1 C) (Axillary)   Resp 18   Ht 5\' 3"  (1.6 m)   Wt 59.8 kg (131 lb 13.4 oz)   SpO2 94%   BMI 23.35 kg/m   General Appearance: No distress  Neuro:without focal findings, mental status  normal. HEENT: PERRLA, EOM intact. Pulmonary: Diminished breath sounds, prolonged expiratory phase, diffuse wheezing noted CardiovascularNormal S1,S2.  No m/r/g.   Abdomen: Benign, Soft, non-tender. Skin:   warm, no rashes, no ecchymosis  Extremities: normal, no cyanosis, clubbing.    LABORATORY PANEL:   CBC  Recent Labs Lab 01/14/17 0536  WBC 12.0*  HGB 13.4  HCT 39.5  PLT 196    Chemistries   Recent Labs Lab 01/14/17 0536  NA 132*  K 3.9  CL 96*  CO2 27  GLUCOSE 125*  BUN 15  CREATININE 0.50  CALCIUM 8.5*  MG 1.8  PHOS 2.3*     Recent Labs Lab 01/12/17 1743 01/12/17 2134 01/13/17 0737 01/13/17 1205 01/13/17 1657 01/13/17 2238  GLUCAP 118* 174* 144* 137* 112* 147*    Recent Labs Lab 01/11/17 2209  PHART 7.21*  PCO2ART 48  PO2ART 155*   No results for input(s): AST, ALT, ALKPHOS, BILITOT, ALBUMIN in the last 168 hours.  Cardiac Enzymes  Recent Labs Lab 01/13/17 0817  TROPONINI 0.34*    RADIOLOGY:  Dg Chest Port 1 View  Result Date: 01/13/2017 CLINICAL DATA:  Acute respiratory failure EXAM: PORTABLE CHEST 1 VIEW COMPARISON:  01/11/2017 FINDINGS: Cardiac shadow remains mildly enlarged. Tortuosity of thoracic aorta is noted. Diffuse interstitial changes are again identified and stable from the prior exam. No focal infiltrate is seen. No bony abnormality is noted. IMPRESSION: Diffuse interstitial changes stable from the previous exam. Electronically Signed   By: Inez Catalina M.D.   On: 01/13/2017 06:20    Hermelinda Dellen, DO 01/14/2017

## 2017-01-15 LAB — BASIC METABOLIC PANEL
ANION GAP: 9 (ref 5–15)
BUN: 25 mg/dL — ABNORMAL HIGH (ref 6–20)
CHLORIDE: 90 mmol/L — AB (ref 101–111)
CO2: 31 mmol/L (ref 22–32)
Calcium: 8.5 mg/dL — ABNORMAL LOW (ref 8.9–10.3)
Creatinine, Ser: 0.47 mg/dL (ref 0.44–1.00)
GFR calc Af Amer: >60 mL/min (ref >60)
Glucose, Bld: 142 mg/dL — ABNORMAL HIGH (ref 65–99)
POTASSIUM: 4 mmol/L (ref 3.5–5.1)
SODIUM: 130 mmol/L — AB (ref 135–145)

## 2017-01-15 LAB — GLUCOSE, CAPILLARY
GLUCOSE-CAPILLARY: 162 mg/dL — AB (ref 65–99)
Glucose-Capillary: 126 mg/dL — ABNORMAL HIGH (ref 65–99)
Glucose-Capillary: 166 mg/dL — ABNORMAL HIGH (ref 65–99)
Glucose-Capillary: 91 mg/dL (ref 65–99)

## 2017-01-15 LAB — PHOSPHORUS: Phosphorus: 3.5 mg/dL (ref 2.5–4.6)

## 2017-01-15 LAB — MAGNESIUM: Magnesium: 1.9 mg/dL (ref 1.7–2.4)

## 2017-01-15 MED ORDER — LOSARTAN POTASSIUM 25 MG PO TABS
12.5000 mg | ORAL_TABLET | Freq: Every day | ORAL | Status: DC
  Administered 2017-01-15: 12.5 mg via ORAL
  Filled 2017-01-15: qty 1

## 2017-01-15 MED ORDER — SODIUM CHLORIDE 0.9% FLUSH
3.0000 mL | Freq: Two times a day (BID) | INTRAVENOUS | Status: DC
  Administered 2017-01-15 – 2017-01-18 (×8): 3 mL via INTRAVENOUS

## 2017-01-15 MED ORDER — ASPIRIN 81 MG PO CHEW
81.0000 mg | CHEWABLE_TABLET | ORAL | Status: AC
  Administered 2017-01-16: 81 mg via ORAL
  Filled 2017-01-15: qty 1

## 2017-01-15 MED ORDER — SODIUM CHLORIDE 0.9 % IV SOLN
INTRAVENOUS | Status: DC
  Administered 2017-01-16: 05:00:00 via INTRAVENOUS

## 2017-01-15 MED ORDER — SODIUM CHLORIDE 0.9% FLUSH
3.0000 mL | INTRAVENOUS | Status: DC | PRN
  Administered 2017-01-17: 3 mL via INTRAVENOUS
  Filled 2017-01-15: qty 3

## 2017-01-15 MED ORDER — DIGOXIN 125 MCG PO TABS
0.1250 mg | ORAL_TABLET | Freq: Every day | ORAL | Status: DC
  Administered 2017-01-15 – 2017-01-17 (×2): 0.125 mg via ORAL
  Filled 2017-01-15 (×3): qty 1

## 2017-01-15 MED ORDER — SODIUM CHLORIDE 0.9 % IV SOLN: 250.0000 mL | INTRAVENOUS | Status: DC | PRN

## 2017-01-15 MED ORDER — MIDODRINE HCL 5 MG PO TABS
2.5000 mg | ORAL_TABLET | Freq: Three times a day (TID) | ORAL | Status: DC
  Administered 2017-01-15 – 2017-01-17 (×4): 2.5 mg via ORAL
  Filled 2017-01-15 (×5): qty 1

## 2017-01-15 MED ORDER — IPRATROPIUM-ALBUTEROL 0.5-2.5 (3) MG/3ML IN SOLN
3.0000 mL | Freq: Four times a day (QID) | RESPIRATORY_TRACT | Status: DC
  Administered 2017-01-15 (×2): 3 mL via RESPIRATORY_TRACT
  Filled 2017-01-15 (×2): qty 3

## 2017-01-15 NOTE — Progress Notes (Signed)
Pt transported to 138 via wheelchair.

## 2017-01-15 NOTE — Progress Notes (Signed)
[place] at [facility]                                                                                                                                                                                  Patient Demographics   Mary Howe, is a 83 y.o. female, DOB - 02/07/1933, JKK:[id number]  Admit date - 01/11/2017   Admitting Physician Lance Coon, MD  Outpatient Primary MD for the patient is [name], MD   LOS - 4  Subjective: Patient's breathing continues to improve she s been transferred to floor    Review of Systems:   CONSTITUTIONAL: No documented fever. No fatigue, weakness. No weight gain, no weight loss.  EYES: No blurry or double vision.  ENT: No tinnitus. No postnasal drip. No redness of the oropharynx.  RESPIRATORY:  positive cough, no wheeze, no hemoptysis. Positive dyspnea.  CARDIOVASCULAR: No chest pain. No orthopnea. No palpitations. No syncope.  GASTROINTESTINAL: No nausea, no vomiting or diarrhea. No abdominal pain. No melena or hematochezia.  GENITOURINARY: No dysuria or hematuria.  ENDOCRINE: No polyuria or nocturia. No heat or cold intolerance.  HEMATOLOGY: No anemia. No bruising. No bleeding.  INTEGUMENTARY: No rashes. No lesions.  MUSCULOSKELETAL: No arthritis. No swelling. No gout.  NEUROLOGIC: No numbness, tingling, or ataxia. No seizure-type activity.  PSYCHIATRIC: No anxiety. No insomnia. No ADD.    Vitals:   Vitals:   01/15/17 0400 01/15/17 0500 01/15/17 0600 01/15/17 0800  BP: (!) 99/59  106/86 99/66  Pulse: 71 69 100 82  Resp: 17 15 (!) 24 18  Temp:      TempSrc:      SpO2: 99% 97% 94% 93%  Weight:      Height:        Wt Readings from Last 3 Encounters:  01/12/17 131 lb 13.4 oz (59.8 kg)  01/11/17 126 lb 9.6 oz (57.4 kg)  12/14/16 128 lb 6.4 oz (58.2 kg)     Intake/Output Summary (Last 24 hours) at 01/15/17 1227 Last data filed at 01/15/17 0600  Gross per 24 hour  Intake              510 ml   Output             3900 ml  Net            -3390 ml    Physical Exam:   GENERAL:Mild respiratory distress HEAD, EYES, EARS, NOSE AND THROAT: Atraumatic, normocephalic. Extraocular muscles are intact. Pupils equal and reactive to light. Sclerae anicteric. No conjunctival injection. No oro-pharyngeal erythema.  NECK: Supple. There is no jugular venous distention. No bruits, no lymphadenopathy, no thyromegaly.  HEART: Regular rate and rhythm,. No murmurs, no rubs, no clicks.  LUNGS: Bilateral wheezing throughout both lungs ABDOMEN: Soft, flat, nontender, nondistended. Has good bowel sounds. No hepatosplenomegaly appreciated.  EXTREMITIES: No evidence of any cyanosis, clubbing, or peripheral edema.  +2 pedal and radial pulses bilaterally.  NEUROLOGIC: The patient is alert, awake, and oriented x3 with no focal motor or sensory deficits appreciated bilaterally.  SKIN: Moist and warm with no rashes appreciated.  Psych: Not anxious, depressed LN: No inguinal LN enlargement    Antibiotics   Anti-infectives    Start     Dose/Rate Route Frequency Ordered Stop   01/14/17 1800  cefTRIAXone (ROCEPHIN) 1 g in dextrose 5 % 50 mL IVPB     1 g 100 mL/hr over 30 Minutes Intravenous Every 24 hours 01/13/17 2242     01/12/17 1800  azithromycin (ZITHROMAX) 500 mg in dextrose 5 % 250 mL IVPB     500 mg 250 mL/hr over 60 Minutes Intravenous Every 24 hours 01/11/17 2203     01/12/17 1700  cefTRIAXone (ROCEPHIN) 1 g in dextrose 5 % 50 mL IVPB  Status:  Discontinued     1 g 100 mL/hr over 30 Minutes Intravenous Every 24 hours 01/11/17 2203 01/13/17 2242   01/11/17 2300  valACYclovir (VALTREX) tablet 1,000 mg     1,000 mg Oral 2 times daily 01/11/17 2248     01/11/17 2145  cefTRIAXone (ROCEPHIN) 1 g in dextrose 5 % 50 mL IVPB - Premix  Status:  Discontinued     1 g 100 mL/hr over 30 Minutes Intravenous  Once 01/11/17 2133 01/13/17 2241   01/11/17 2145  azithromycin (ZITHROMAX) 500 mg in dextrose 5 % 250  mL IVPB     500 mg 250 mL/hr over 60 Minutes Intravenous  Once 01/11/17 2133 01/12/17 0027      Medications   Scheduled Meds: . aspirin  81 mg Oral Daily  . atorvastatin  40 mg Oral q1800  . bisoprolol  2.5 mg Oral Daily  . budesonide (PULMICORT) nebulizer solution  0.5 mg Nebulization BID  . chlorhexidine  15 mL Mouth Rinse BID  . enoxaparin (LOVENOX) injection  40 mg Subcutaneous Q24H  . escitalopram  10 mg Oral Daily  . furosemide  20 mg Intravenous BID  . insulin aspart  0-15 Units Subcutaneous TID WC  . insulin aspart  0-5 Units Subcutaneous QHS  . insulin aspart  3 Units Subcutaneous TID WC  . ipratropium-albuterol  3 mL Nebulization Q6H  . losartan  12.5 mg Oral Daily  . mouth rinse  15 mL Mouth Rinse q12n4p  . methylPREDNISolone (SOLU-MEDROL) injection  40 mg Intravenous Q12H  . pantoprazole  40 mg Oral Daily  . protein supplement shake  11 oz Oral BID BM  . valACYclovir  1,000 mg Oral BID   Continuous Infusions: . azithromycin Stopped (01/14/17 1910)  . cefTRIAXone (ROCEPHIN) IVPB 1 gram/50 mL D5W Stopped (01/14/17 1840)  . norepinephrine (LEVOPHED) Adult infusion Stopped (01/12/17 1413)   PRN Meds:.acetaminophen **OR** acetaminophen, ALPRAZolam, guaiFENesin, ipratropium-albuterol, ondansetron **OR** ondansetron (ZOFRAN) IV   Data Review:   Micro Results Recent Results (from the past 240 hour(s))  Culture, blood (routine x 2)     Status: None (Preliminary result)   Collection Time: 01/11/17 10:09 PM  Result Value Ref Range Status   Specimen Description BLOOD RFOA  Final   Special Requests   Final    BOTTLES DRAWN AEROBIC AND ANAEROBIC Blood Culture adequate volume  Culture NO GROWTH 4 DAYS  Final   Report Status PENDING  Incomplete  Culture, blood (routine x 2)     Status: None (Preliminary result)   Collection Time: 01/11/17 10:09 PM  Result Value Ref Range Status   Specimen Description BLOOD LFOA  Final   Special Requests   Final    BOTTLES DRAWN  AEROBIC AND ANAEROBIC Blood Culture adequate volume   Culture NO GROWTH 4 DAYS  Final   Report Status PENDING  Incomplete  MRSA PCR Screening     Status: None   Collection Time: 01/12/17 12:08 AM  Result Value Ref Range Status   MRSA by PCR NEGATIVE NEGATIVE Final    Comment:        The GeneXpert MRSA Assay (FDA approved for NASAL specimens only), is one component of a comprehensive MRSA colonization surveillance program. It is not intended to diagnose MRSA infection nor to guide or monitor treatment for MRSA infections.   Respiratory Panel by PCR     Status: None   Collection Time: 01/12/17  9:00 AM  Result Value Ref Range Status   Adenovirus NOT DETECTED NOT DETECTED Final   Coronavirus 229E NOT DETECTED NOT DETECTED Final   Coronavirus HKU1 NOT DETECTED NOT DETECTED Final   Coronavirus NL63 NOT DETECTED NOT DETECTED Final   Coronavirus OC43 NOT DETECTED NOT DETECTED Final   Metapneumovirus NOT DETECTED NOT DETECTED Final   Rhinovirus / Enterovirus NOT DETECTED NOT DETECTED Final   Influenza A NOT DETECTED NOT DETECTED Final   Influenza B NOT DETECTED NOT DETECTED Final   Parainfluenza Virus 1 NOT DETECTED NOT DETECTED Final   Parainfluenza Virus 2 NOT DETECTED NOT DETECTED Final   Parainfluenza Virus 3 NOT DETECTED NOT DETECTED Final   Parainfluenza Virus 4 NOT DETECTED NOT DETECTED Final   Respiratory Syncytial Virus NOT DETECTED NOT DETECTED Final   Bordetella pertussis NOT DETECTED NOT DETECTED Final   Chlamydophila pneumoniae NOT DETECTED NOT DETECTED Final   Mycoplasma pneumoniae NOT DETECTED NOT DETECTED Final    Comment: Performed at [facility], [place] [address] [place], [place] [address]  Culture, Urine     Status: Abnormal   Collection Time: 01/12/17 10:13 AM  Result Value Ref Range Status   Specimen Description URINE, RANDOM  Final   Special Requests NONE  Final   Culture MULTIPLE SPECIES PRESENT, SUGGEST RECOLLECTION (A)  Final   Report Status  01/13/2017 FINAL  Final    Radiology Reports Dg Chest 2 View  Result Date: 01/11/2017 CLINICAL DATA:  Cough.  History of COPD. EXAM: CHEST  2 VIEW COMPARISON:  None. FINDINGS: The cardiac silhouette is mildly enlarged. No mediastinal or hilar masses. No evidence of adenopathy. Lungs are hyperexpanded. Prominent bronchovascular markings are noted in the lung bases most evident in the lower lobes. There is a paucity of vascular markings in the upper lobes consistent with emphysema. No evidence of pneumonia or pulmonary edema. No pleural effusion or pneumothorax. Skeletal structures are demineralized. There are mild compression fractures of four upper to midthoracic vertebra, likely old. IMPRESSION: 1. No acute cardiopulmonary disease. 2. COPD/emphysema. 3. Mild cardiomegaly. 4. Mild thoracic vertebral compression fractures, of unclear chronicity, but likely old if there is no current back pain. Electronically Signed   By: Lajean Manes M.D.   On: 01/11/2017 15:05   Dg Chest Port 1 View  Result Date: 01/13/2017 CLINICAL DATA:  Acute respiratory failure EXAM: PORTABLE CHEST 1 VIEW COMPARISON:  01/11/2017 FINDINGS: Cardiac shadow remains  mildly enlarged. Tortuosity of thoracic aorta is noted. Diffuse interstitial changes are again identified and stable from the prior exam. No focal infiltrate is seen. No bony abnormality is noted. IMPRESSION: Diffuse interstitial changes stable from the previous exam. Electronically Signed   By: Inez Catalina M.D.   On: 01/13/2017 06:20   Dg Chest Portable 1 View  Result Date: 01/11/2017 CLINICAL DATA:  Initial evaluation for acute respiratory distress. EXAM: PORTABLE CHEST 1 VIEW COMPARISON:  Prior radiograph from 06/10/2016. FINDINGS: Cardiomegaly.  Mediastinal silhouette normal. Lungs hyperinflated with underlying emphysematous changes. Superimposed bibasilar interstitial prominence with scattered Kerley B-lines, suggesting mild pulmonary interstitial edema. Superimposed  infection not excluded. Blunting of the right costophrenic angle, suggestive of small effusion. No pneumothorax. No acute osseus abnormality. IMPRESSION: 1. Bibasilar interstitial congestion/prominence, suggesting mild pulmonary interstitial edema. Superimposed infection not excluded, and could be considered in the correct clinical setting. 2. Blunting of the right costophrenic angle, suggestive of small effusion. 3. Underlying emphysema. 4. Cardiomegaly. Electronically Signed   By: Jeannine Boga M.D.   On: 01/11/2017 21:02     CBC  Recent Labs Lab 01/11/17 2030 01/12/17 0350 01/14/17 0536  WBC 13.6* 11.9* 12.0*  HGB 16.0 14.4 13.4  HCT 48.1* 42.0 39.5  PLT 327 276 196  MCV 96.3 96.0 95.5  MCH 32.0 32.9 32.5  MCHC 33.2 34.2 34.0  RDW 14.4 13.7 13.9  LYMPHSABS 6.4*  --   --   MONOABS 0.7  --   --   EOSABS 1.1*  --   --   BASOSABS 0.2*  --   --     Chemistries   Recent Labs Lab 01/11/17 2030 01/12/17 0350 01/14/17 0536 01/15/17 0431  NA 132* 135 132* 130*  K 4.4 3.9 3.9 4.0  CL 97* 104 96* 90*  CO2 23 24 27 31   GLUCOSE 388* 256* 125* 142*  BUN 14 19 15  25*  CREATININE 0.87 0.75 0.50 0.47  CALCIUM 9.3 8.2* 8.5* 8.5*  MG  --   --  1.8 1.9   ------------------------------------------------------------------------------------------------------------------ estimated creatinine clearance is 44.8 mL/min (by C-G formula based on SCr of 0.47 mg/dL). ------------------------------------------------------------------------------------------------------------------ No results for input(s): HGBA1C in the last 72 hours. ------------------------------------------------------------------------------------------------------------------ No results for input(s): CHOL, HDL, LDLCALC, TRIG, CHOLHDL, LDLDIRECT in the last 72 hours. ------------------------------------------------------------------------------------------------------------------ No results for input(s): TSH, T4TOTAL,  T3FREE, THYROIDAB in the last 72 hours.  Invalid input(s): FREET3 ------------------------------------------------------------------------------------------------------------------ No results for input(s): VITAMINB12, FOLATE, FERRITIN, TIBC, IRON, RETICCTPCT in the last 72 hours.  Coagulation profile No results for input(s): INR, PROTIME in the last 168 hours.  No results for input(s): DDIMER in the last 72 hours.  Cardiac Enzymes  Recent Labs Lab 01/12/17 0019 01/13/17 0227 01/13/17 0817  TROPONINI 0.54* 0.43* 0.34*   ------------------------------------------------------------------------------------------------------------------ Invalid input(s): POCBNP    Assessment & Plan  Patient's 83 year old with COPD with respiratory failure  1. Acute respiratory failure with hypercapnia (HCC) - Due to COPD exacerbation, As well as acute systolic CHF Continue therapy with antibiotics, nebulizer  2. Acute systolic CHF Continue lasix, patient started on Cozaar however blood pressures in the 90s- I will have to stop that  3. CAP (community acquired pneumonia) -continue IV ceftriaxone     4.  Essential hypertension - low pressure intermittently low, midrodrine   5.  Diabetes mellitus without complication (HCC) - sliding scale insulin with corresponding glucose checks  6. Miscellaneous Lovenox for DVT prophylaxis     Code Status Orders  Start     Ordered   01/11/17 2248  Full code  Continuous     01/11/17 2248    Code Status History    Date Active Date Inactive Code Status Order ID Comments User Context   01/12/2016 10:12 AM 01/14/2016 12:46 AM Full Code [id number]  Saundra Shelling, MD Inpatient           Consults  intensvist  DVT Prophylaxis  Lovenox   Lab Results  Component Value Date   PLT 196 01/14/2017     Time Spent in minutes  30min Greater than 50% of time spent in care coordination and counseling patient regarding the condition and plan of  care.   Dustin Flock M.D on 01/15/2017 at 12:27 PM  Between 7am to 6pm - Pager - [phone]  After 6pm go to [email or web address] - password [email or web address] [facility]   Office  [phone]

## 2017-01-15 NOTE — Progress Notes (Signed)
Report given to Floor Nurse.  Denies questions.

## 2017-01-15 NOTE — Progress Notes (Signed)
Progress Note  Patient Name: Mary Howe Date of Encounter: 01/15/2017  Primary Cardiologist: New - Gollan  Subjective   Breathing improved. Only mild shortness of breath. No chest pain, edema, or orthopnea. Patient tolerated bisoprolol well. Some hypotension noted after Entresto (ordered by PCCM).  Inpatient Medications    Scheduled Meds: . aspirin  81 mg Oral Daily  . [START ON 01/16/2017] aspirin  81 mg Oral Pre-Cath  . atorvastatin  40 mg Oral q1800  . bisoprolol  2.5 mg Oral Daily  . budesonide (PULMICORT) nebulizer solution  0.5 mg Nebulization BID  . chlorhexidine  15 mL Mouth Rinse BID  . enoxaparin (LOVENOX) injection  40 mg Subcutaneous Q24H  . escitalopram  10 mg Oral Daily  . insulin aspart  0-15 Units Subcutaneous TID WC  . insulin aspart  0-5 Units Subcutaneous QHS  . insulin aspart  3 Units Subcutaneous TID WC  . ipratropium-albuterol  3 mL Nebulization Q6H  . losartan  12.5 mg Oral Daily  . mouth rinse  15 mL Mouth Rinse q12n4p  . methylPREDNISolone (SOLU-MEDROL) injection  40 mg Intravenous Q12H  . pantoprazole  40 mg Oral Daily  . protein supplement shake  11 oz Oral BID BM  . sodium chloride flush  3 mL Intravenous Q12H  . valACYclovir  1,000 mg Oral BID   Continuous Infusions: . sodium chloride    . [START ON 01/16/2017] sodium chloride    . azithromycin Stopped (01/14/17 1910)  . cefTRIAXone (ROCEPHIN) IVPB 1 gram/50 mL D5W Stopped (01/14/17 1840)   PRN Meds: sodium chloride, acetaminophen **OR** acetaminophen, ALPRAZolam, guaiFENesin, ipratropium-albuterol, ondansetron **OR** ondansetron (ZOFRAN) IV, sodium chloride flush   Vital Signs    Vitals:   01/15/17 0400 01/15/17 0500 01/15/17 0600 01/15/17 0800  BP: (!) 99/59  106/86 99/66  Pulse: 71 69 100 82  Resp: 17 15 (!) 24 18  Temp:      TempSrc:      SpO2: 99% 97% 94% 93%  Weight:      Height:        Intake/Output Summary (Last 24 hours) at 01/15/17 1229 Last data filed at 01/15/17  0600  Gross per 24 hour  Intake              510 ml  Output             3900 ml  Net            -3390 ml   Filed Weights   01/11/17 2034 01/12/17 0017  Weight: 126 lb (57.2 kg) 131 lb 13.4 oz (59.8 kg)    Telemetry    Normal sinus rhythm and sinus tachycardia with occasional PVCs - Personally Reviewed  ECG    No new tracing  Physical Exam   GEN: No acute distress.   Neck: No JVD Cardiac: RRR, no murmurs, rubs, or gallops.  Respiratory: Mildly diminished breath sounds with faint rhonchi. No crackles. GI: Soft, nontender, non-distended  MS: No edema; No deformity. Neuro:  Nonfocal  Psych: Normal affect   Labs    Chemistry Recent Labs Lab 01/12/17 0350 01/14/17 0536 01/15/17 0431  NA 135 132* 130*  K 3.9 3.9 4.0  CL 104 96* 90*  CO2 24 27 31   GLUCOSE 256* 125* 142*  BUN 19 15 25*  CREATININE 0.75 0.50 0.47  CALCIUM 8.2* 8.5* 8.5*  GFRNONAA >60 >60 >60  GFRAA >60 >60 >60  ANIONGAP 7 9 9      Hematology Recent Labs  Lab 01/11/17 2030 01/12/17 0350 01/14/17 0536  WBC 13.6* 11.9* 12.0*  RBC 4.99 4.38 4.13  HGB 16.0 14.4 13.4  HCT 48.1* 42.0 39.5  MCV 96.3 96.0 95.5  MCH 32.0 32.9 32.5  MCHC 33.2 34.2 34.0  RDW 14.4 13.7 13.9  PLT 327 276 196    Cardiac Enzymes Recent Labs Lab 01/11/17 2030 01/12/17 0019 01/13/17 0227 01/13/17 0817  TROPONINI 0.10* 0.54* 0.43* 0.34*   No results for input(s): TROPIPOC in the last 168 hours.   BNPNo results for input(s): BNP, PROBNP in the last 168 hours.   DDimer No results for input(s): DDIMER in the last 168 hours.   Radiology    No results found.  Cardiac Studies   Echo (01/12/17): Normal LV size with LVEF 20-25% with preserved contraction of the basal segments and otherwise marked hypokinesis. Grade 1 diastolic dysfunction. Mild MR. Mild LAE. Normal RV size and function. Mild to moderate TR with moderate PH (RVSP 55 mmHg).   Patient Profile     83 y.o. female history of COPD, HLD, hypothyroidism,  diverticulitis, and GERD, admitted with acute respiratory failure and newly discovered cardiomyopathy with severe LV dysfunction.  Assessment & Plan    Acute on chronic hypoxic respiratory failure This seems to be improving, with the patient breathing comfortably on 3 L nasal cannula.  Continue treatment of COPD exacerbation per primary service. As before, I would advocate for weaning of steroids as soon as possible.  Hold diuresis this evening and tomorrow in anticipation of catheterization. Further management of heart failure, as below.  Acute systolic and diastolic heart failure Patient appears euvolemic and was net -4.4 L yesterday, though I question accuracy of intake. No weight has been recorded for the last 3 days.  Hold additional Lasix pending cardiac catheterization.  Continue bisoprolol 2.5 mg daily.  Entresto discontinued and losartan 12.5 mg daily started in the setting of borderline low blood pressure and hypotension with Entresto.  Plan for left and right heart catheterization tomorrow. I have reviewed the risks, indications, and alternatives to cardiac catheterization, possible angioplasty, and stenting with the patient. Risks include but are not limited to bleeding, infection, vascular injury, stroke, myocardial infection, arrhythmia, kidney injury, radiation-related injury in the case of prolonged fluoroscopy use, emergency cardiac surgery, and death. The patient understands the risks of serious complication is 1-2 in 7371 with diagnostic cardiac cath and 1-2% or less with angioplasty/stenting.  Daily weights and strict I&O's.  Elevated troponin I suspect this was due to supply demand mismatch in the setting of acute respiratory failure and heart failure.  Continue heart failure management, as above.  Left and right heart catheterization tomorrow.  Continue aspirin and atorvastatin; I will recheck LFTs tomorrow morning given history of transaminitis with  simvastatin.    Signed, Nelva Bush, MD  01/15/2017, 12:29 PM

## 2017-01-15 NOTE — Progress Notes (Signed)
Sent message to pharmacy to send azithromycin and rocephin due at 6pm

## 2017-01-15 NOTE — Progress Notes (Signed)
  [facility] Progess Note    SYNOPSIS   83 yo female admitted 10/3 with acute on chronic hypoxic hypercapnic respiratory failure secondary to atypical pulmonary infection vs. pulmonary interstitial edema and AECOPD requiring continuous Bipap, acute encephalopathy likely secondary to hypercapnia, and sepsis with hypotension requiring levophed gtt     ASSESSMENT/PLAN   Acute on chronic hypercapnic respiratory failure. Presently on azithromycin, Rocephin, albuterol, Atrovent, Solu-Medrol, weaned off of BiPAP, doing well this morning. Will monitor patient's blood pressure as she was briefly on norepinephrine and if her hemodynamics remain stable will transfer to the floor.  Mildly elevated troponin. Most likely reflects supply demand ischemia. Peak troponin at 0.43  Hyperglycemia, on coverage  Mild leukocytosis  Elevated lactic acid yesterday at 3.3. Stable hemodynamics this morning  Patient is stable for floor transfers  Name: Mary Howe MRN: [id number] DOB: [birth year]    ADMISSION DATE:  01/11/2017   SUBJECTIVE:  Off pressors and now on nocturnal BiPAP only. Offers no complaints  VITAL SIGNS: Temp:  [97.7 F (36.5 C)-98.6 F (37 C)] 98.6 F (37 C) (10/07 0200) Pulse Rate:  [69-110] 100 (10/07 0600) Resp:  [12-28] 24 (10/07 0600) BP: (87-150)/(52-91) 106/86 (10/07 0600) SpO2:  [86 %-100 %] 94 % (10/07 0600) FiO2 (%):  [32 %-40 %] 40 % (10/07 0200)  PHYSICAL EXAMINATION: Physical Examination:   VS: BP 106/86   Pulse 100   Temp 98.6 F (37 C) (Oral)   Resp (!) 24   Ht 5\' 3"  (1.6 m)   Wt 131 lb 13.4 oz (59.8 kg)   SpO2 94%   BMI 23.35 kg/m   General Appearance: No distress  Neuro:without focal findings, mental status normal. HEENT: PERRLA, EOM intact. Pulmonary: Diminished breath sounds, prolonged expiratory phase, diffuse wheezing improved CardiovascularNormal S1,S2.  No m/r/g.   Abdomen: Benign, Soft, non-tender. Skin:   warm, no  rashes, no ecchymosis  Extremities: normal, no cyanosis, clubbing.    LABORATORY PANEL:   CBC  Recent Labs Lab 01/14/17 0536  WBC 12.0*  HGB 13.4  HCT 39.5  PLT 196    Chemistries   Recent Labs Lab 01/15/17 0431  NA 130*  K 4.0  CL 90*  CO2 31  GLUCOSE 142*  BUN 25*  CREATININE 0.47  CALCIUM 8.5*  MG 1.9  PHOS 3.5     Recent Labs Lab 01/13/17 1657 01/13/17 2238 01/14/17 0754 01/14/17 1157 01/14/17 1654 01/14/17 2131  GLUCAP 112* 147* 122* 121* 87 154*    Recent Labs Lab 01/11/17 2209  PHART 7.21*  PCO2ART 48  PO2ART 155*   No results for input(s): AST, ALT, ALKPHOS, BILITOT, ALBUMIN in the last 168 hours.  Cardiac Enzymes  Recent Labs Lab 01/13/17 0817  TROPONINI 0.34*    RADIOLOGY:  No results found. Magdalene S. [facility] ANP-BC Pulmonary and [place] Pager [phone] or [phone] 01/15/2017   Pulmonary/critical care attending  I have personally seen and examined Mary Howe, reviewed revise and confirm Mary Howe note. Patient is doing quite well today, without any complaints She remains off of BiPAP presently on nasal cannula with stable oxygenation. We'll transfer patient to regular part of the hospital.  Hermelinda Dellen, D.O.

## 2017-01-16 ENCOUNTER — Encounter
Admission: EM | Disposition: A | Discharge status: Home / Self Care | Payer: Self-pay | Source: Home / Self Care | Attending: Internal Medicine

## 2017-01-16 ENCOUNTER — Encounter: Payer: Self-pay | Admitting: Cardiovascular Disease

## 2017-01-16 DIAGNOSIS — I5021 Acute systolic (congestive) heart failure: Secondary | ICD-10-CM

## 2017-01-16 HISTORY — PX: RIGHT/LEFT HEART CATH AND CORONARY ANGIOGRAPHY: CATH118266

## 2017-01-16 LAB — CBC
HCT: 43 % (ref 35.0–47.0)
Hemoglobin: 14.8 g/dL (ref 12.0–16.0)
MCH: 32 pg (ref 26.0–34.0)
MCHC: 34.5 g/dL (ref 32.0–36.0)
MCV: 92.7 fL (ref 80.0–100.0)
PLATELETS: 282 10*3/uL (ref 150–440)
RBC: 4.63 MIL/uL (ref 3.80–5.20)
RDW: 13.7 % (ref 11.5–14.5)
WBC: 11.1 10*3/uL — ABNORMAL HIGH (ref 3.6–11.0)

## 2017-01-16 LAB — COMPREHENSIVE METABOLIC PANEL
ALT: 53 U/L (ref 14–54)
AST: 25 U/L (ref 15–41)
Albumin: 3.5 g/dL (ref 3.5–5.0)
Alkaline Phosphatase: 64 U/L (ref 38–126)
Anion gap: 8 (ref 5–15)
BUN: 26 mg/dL — ABNORMAL HIGH (ref 6–20)
CALCIUM: 9.6 mg/dL (ref 8.9–10.3)
CHLORIDE: 93 mmol/L — AB (ref 101–111)
CO2: 31 mmol/L (ref 22–32)
CREATININE: 0.45 mg/dL (ref 0.44–1.00)
Glucose, Bld: 134 mg/dL — ABNORMAL HIGH (ref 65–99)
Potassium: 4.4 mmol/L (ref 3.5–5.1)
Sodium: 132 mmol/L — ABNORMAL LOW (ref 135–145)
Total Bilirubin: 0.6 mg/dL (ref 0.3–1.2)
Total Protein: 6.3 g/dL — ABNORMAL LOW (ref 6.5–8.1)

## 2017-01-16 LAB — CULTURE, BLOOD (ROUTINE X 2)
CULTURE: NO GROWTH
CULTURE: NO GROWTH
Special Requests: ADEQUATE
Special Requests: ADEQUATE

## 2017-01-16 LAB — GLUCOSE, CAPILLARY
Glucose-Capillary: 123 mg/dL — ABNORMAL HIGH (ref 65–99)
Glucose-Capillary: 109 mg/dL — ABNORMAL HIGH (ref 65–99)
Glucose-Capillary: 136 mg/dL — ABNORMAL HIGH (ref 65–99)

## 2017-01-16 LAB — PROTIME-INR
INR: 1.09
PROTHROMBIN TIME: 14 s (ref 11.4–15.2)

## 2017-01-16 SURGERY — RIGHT/LEFT HEART CATH AND CORONARY ANGIOGRAPHY
Anesthesia: Moderate Sedation

## 2017-01-16 MED ORDER — HEPARIN (PORCINE) IN NACL 2-0.9 UNIT/ML-% IJ SOLN
INTRAMUSCULAR | Status: AC
  Filled 2017-01-16: qty 500

## 2017-01-16 MED ORDER — SODIUM CHLORIDE 0.9 % IV SOLN: 250.0000 mL | INTRAVENOUS | Status: DC | PRN

## 2017-01-16 MED ORDER — FENTANYL CITRATE (PF) 100 MCG/2ML IJ SOLN
INTRAMUSCULAR | Status: AC
  Filled 2017-01-16: qty 2

## 2017-01-16 MED ORDER — IPRATROPIUM-ALBUTEROL 0.5-2.5 (3) MG/3ML IN SOLN
3.0000 mL | Freq: Three times a day (TID) | RESPIRATORY_TRACT | Status: DC
Start: 2017-01-16 — End: 2017-01-18
  Administered 2017-01-16 – 2017-01-18 (×7): 3 mL via RESPIRATORY_TRACT
  Filled 2017-01-16 (×9): qty 3

## 2017-01-16 MED ORDER — VERAPAMIL HCL 2.5 MG/ML IV SOLN
INTRAVENOUS | Status: AC
  Filled 2017-01-16: qty 2

## 2017-01-16 MED ORDER — SODIUM CHLORIDE 0.9% FLUSH: 3.0000 mL | INTRAVENOUS | Status: DC | PRN

## 2017-01-16 MED ORDER — IOPAMIDOL (ISOVUE-300) INJECTION 61%
INTRAVENOUS | Status: DC | PRN
  Administered 2017-01-16: 45 mL via INTRA_ARTERIAL

## 2017-01-16 MED ORDER — VERAPAMIL HCL 2.5 MG/ML IV SOLN
INTRAVENOUS | Status: DC | PRN
  Administered 2017-01-16: 2.5 mg via INTRA_ARTERIAL

## 2017-01-16 MED ORDER — ACETYLCYSTEINE 20 % IN SOLN
4.0000 mL | Freq: Two times a day (BID) | RESPIRATORY_TRACT | Status: DC
  Administered 2017-01-16 – 2017-01-18 (×4): 4 mL via RESPIRATORY_TRACT
  Filled 2017-01-16 (×6): qty 4

## 2017-01-16 MED ORDER — SODIUM CHLORIDE 0.9% FLUSH
3.0000 mL | Freq: Two times a day (BID) | INTRAVENOUS | Status: DC
  Administered 2017-01-16 – 2017-01-18 (×4): 3 mL via INTRAVENOUS

## 2017-01-16 MED ORDER — MIDAZOLAM HCL 2 MG/2ML IJ SOLN
INTRAMUSCULAR | Status: AC
  Filled 2017-01-16: qty 2

## 2017-01-16 MED ORDER — AZITHROMYCIN 250 MG PO TABS
250.0000 mg | ORAL_TABLET | Freq: Once | ORAL | Status: AC
  Administered 2017-01-16: 250 mg via ORAL
  Filled 2017-01-16: qty 1

## 2017-01-16 MED ORDER — HEPARIN SODIUM (PORCINE) 1000 UNIT/ML IJ SOLN
INTRAMUSCULAR | Status: AC
  Filled 2017-01-16: qty 1

## 2017-01-16 SURGICAL SUPPLY — 10 items
CATH BALLN WEDGE 5F 110CM (CATHETERS) ×3 IMPLANT
CATH OPTITORQUE JACKY 4.0 5F (CATHETERS) ×3 IMPLANT
DEVICE RAD TR BAND REGULAR (VASCULAR PRODUCTS) ×3 IMPLANT
GLIDESHEATH SLEND SS 6F .021 (SHEATH) ×3 IMPLANT
GUIDEWIRE .025 260CM (WIRE) ×3 IMPLANT
KIT MANI 3VAL PERCEP (MISCELLANEOUS) ×3 IMPLANT
KIT RIGHT HEART (MISCELLANEOUS) ×3 IMPLANT
PACK CARDIAC CATH (CUSTOM PROCEDURE TRAY) ×3 IMPLANT
SHEATH GLIDE SLENDER 4/5FR (SHEATH) ×3 IMPLANT
WIRE ROSEN-J .035X260CM (WIRE) ×3 IMPLANT

## 2017-01-16 NOTE — OR Nursing (Signed)
TR Band removed and site Dressed per protocol

## 2017-01-16 NOTE — Progress Notes (Signed)
Bipap declined 

## 2017-01-16 NOTE — H&P (View-Only) (Signed)
Progress Note  Patient Name: Mary Howe Date of Encounter: 01/15/2017  Primary Cardiologist: New - Gollan  Subjective   Breathing improved. Only mild shortness of breath. No chest pain, edema, or orthopnea. Patient tolerated bisoprolol well. Some hypotension noted after Entresto (ordered by PCCM).  Inpatient Medications    Scheduled Meds: . aspirin  81 mg Oral Daily  . [START ON 01/16/2017] aspirin  81 mg Oral Pre-Cath  . atorvastatin  40 mg Oral q1800  . bisoprolol  2.5 mg Oral Daily  . budesonide (PULMICORT) nebulizer solution  0.5 mg Nebulization BID  . chlorhexidine  15 mL Mouth Rinse BID  . enoxaparin (LOVENOX) injection  40 mg Subcutaneous Q24H  . escitalopram  10 mg Oral Daily  . insulin aspart  0-15 Units Subcutaneous TID WC  . insulin aspart  0-5 Units Subcutaneous QHS  . insulin aspart  3 Units Subcutaneous TID WC  . ipratropium-albuterol  3 mL Nebulization Q6H  . losartan  12.5 mg Oral Daily  . mouth rinse  15 mL Mouth Rinse q12n4p  . methylPREDNISolone (SOLU-MEDROL) injection  40 mg Intravenous Q12H  . pantoprazole  40 mg Oral Daily  . protein supplement shake  11 oz Oral BID BM  . sodium chloride flush  3 mL Intravenous Q12H  . valACYclovir  1,000 mg Oral BID   Continuous Infusions: . sodium chloride    . [START ON 01/16/2017] sodium chloride    . azithromycin Stopped (01/14/17 1910)  . cefTRIAXone (ROCEPHIN) IVPB 1 gram/50 mL D5W Stopped (01/14/17 1840)   PRN Meds: sodium chloride, acetaminophen **OR** acetaminophen, ALPRAZolam, guaiFENesin, ipratropium-albuterol, ondansetron **OR** ondansetron (ZOFRAN) IV, sodium chloride flush   Vital Signs    Vitals:   01/15/17 0400 01/15/17 0500 01/15/17 0600 01/15/17 0800  BP: (!) 99/59  106/86 99/66  Pulse: 71 69 100 82  Resp: 17 15 (!) 24 18  Temp:      TempSrc:      SpO2: 99% 97% 94% 93%  Weight:      Height:        Intake/Output Summary (Last 24 hours) at 01/15/17 1229 Last data filed at 01/15/17  0600  Gross per 24 hour  Intake              510 ml  Output             3900 ml  Net            -3390 ml   Filed Weights   01/11/17 2034 01/12/17 0017  Weight: 126 lb (57.2 kg) 131 lb 13.4 oz (59.8 kg)    Telemetry    Normal sinus rhythm and sinus tachycardia with occasional PVCs - Personally Reviewed  ECG    No new tracing  Physical Exam   GEN: No acute distress.   Neck: No JVD Cardiac: RRR, no murmurs, rubs, or gallops.  Respiratory: Mildly diminished breath sounds with faint rhonchi. No crackles. GI: Soft, nontender, non-distended  MS: No edema; No deformity. Neuro:  Nonfocal  Psych: Normal affect   Labs    Chemistry Recent Labs Lab 01/12/17 0350 01/14/17 0536 01/15/17 0431  NA 135 132* 130*  K 3.9 3.9 4.0  CL 104 96* 90*  CO2 24 27 31   GLUCOSE 256* 125* 142*  BUN 19 15 25*  CREATININE 0.75 0.50 0.47  CALCIUM 8.2* 8.5* 8.5*  GFRNONAA >60 >60 >60  GFRAA >60 >60 >60  ANIONGAP 7 9 9      Hematology Recent Labs  Lab 01/11/17 2030 01/12/17 0350 01/14/17 0536  WBC 13.6* 11.9* 12.0*  RBC 4.99 4.38 4.13  HGB 16.0 14.4 13.4  HCT 48.1* 42.0 39.5  MCV 96.3 96.0 95.5  MCH 32.0 32.9 32.5  MCHC 33.2 34.2 34.0  RDW 14.4 13.7 13.9  PLT 327 276 196    Cardiac Enzymes Recent Labs Lab 01/11/17 2030 01/12/17 0019 01/13/17 0227 01/13/17 0817  TROPONINI 0.10* 0.54* 0.43* 0.34*   No results for input(s): TROPIPOC in the last 168 hours.   BNPNo results for input(s): BNP, PROBNP in the last 168 hours.   DDimer No results for input(s): DDIMER in the last 168 hours.   Radiology    No results found.  Cardiac Studies   Echo (01/12/17): Normal LV size with LVEF 20-25% with preserved contraction of the basal segments and otherwise marked hypokinesis. Grade 1 diastolic dysfunction. Mild MR. Mild LAE. Normal RV size and function. Mild to moderate TR with moderate PH (RVSP 55 mmHg).   Patient Profile     83 y.o. female history of COPD, HLD, hypothyroidism,  diverticulitis, and GERD, admitted with acute respiratory failure and newly discovered cardiomyopathy with severe LV dysfunction.  Assessment & Plan    Acute on chronic hypoxic respiratory failure This seems to be improving, with the patient breathing comfortably on 3 L nasal cannula.  Continue treatment of COPD exacerbation per primary service. As before, I would advocate for weaning of steroids as soon as possible.  Hold diuresis this evening and tomorrow in anticipation of catheterization. Further management of heart failure, as below.  Acute systolic and diastolic heart failure Patient appears euvolemic and was net -4.4 L yesterday, though I question accuracy of intake. No weight has been recorded for the last 3 days.  Hold additional Lasix pending cardiac catheterization.  Continue bisoprolol 2.5 mg daily.  Entresto discontinued and losartan 12.5 mg daily started in the setting of borderline low blood pressure and hypotension with Entresto.  Plan for left and right heart catheterization tomorrow. I have reviewed the risks, indications, and alternatives to cardiac catheterization, possible angioplasty, and stenting with the patient. Risks include but are not limited to bleeding, infection, vascular injury, stroke, myocardial infection, arrhythmia, kidney injury, radiation-related injury in the case of prolonged fluoroscopy use, emergency cardiac surgery, and death. The patient understands the risks of serious complication is 1-2 in 2263 with diagnostic cardiac cath and 1-2% or less with angioplasty/stenting.  Daily weights and strict I&O's.  Elevated troponin I suspect this was due to supply demand mismatch in the setting of acute respiratory failure and heart failure.  Continue heart failure management, as above.  Left and right heart catheterization tomorrow.  Continue aspirin and atorvastatin; I will recheck LFTs tomorrow morning given history of transaminitis with  simvastatin.    Signed, Nelva Bush, MD  01/15/2017, 12:29 PM

## 2017-01-16 NOTE — Interval H&P Note (Signed)
History and Physical Interval Note:  01/16/2017 12:32 PM  Mary Howe  has presented today for surgery, with the diagnosis of Acute systolic heart failure  The various methods of treatment have been discussed with the patient and family. After consideration of risks, benefits and other options for treatment, the patient has consented to  Procedure(s): RIGHT/LEFT HEART CATH AND CORONARY ANGIOGRAPHY (N/A) as a surgical intervention .  The patient's history has been reviewed, patient examined, no change in status, stable for surgery.  I have reviewed the patient's chart and labs.  Questions were answered to the patient's satisfaction.     [name]

## 2017-01-16 NOTE — Progress Notes (Signed)
[place] at [facility]                                                                                                                                                                                  Patient Demographics   Mary Howe, is a 83 y.o. female, DOB - 1932/07/28, KYH:[id number]  Admit date - 01/11/2017   Admitting Physician Lance Coon, MD  Outpatient Primary MD for the patient is [name], MD   LOS - 5  Subjective: Patient still short of breath. Awaiting cardiac catheterization   Review of Systems:   CONSTITUTIONAL: No documented fever. No fatigue, weakness. No weight gain, no weight loss.  EYES: No blurry or double vision.  ENT: No tinnitus. No postnasal drip. No redness of the oropharynx.  RESPIRATORY:  positive cough, no wheeze, no hemoptysis. Positive dyspnea.  CARDIOVASCULAR: No chest pain. No orthopnea. No palpitations. No syncope.  GASTROINTESTINAL: No nausea, no vomiting or diarrhea. No abdominal pain. No melena or hematochezia.  GENITOURINARY: No dysuria or hematuria.  ENDOCRINE: No polyuria or nocturia. No heat or cold intolerance.  HEMATOLOGY: No anemia. No bruising. No bleeding.  INTEGUMENTARY: No rashes. No lesions.  MUSCULOSKELETAL: No arthritis. No swelling. No gout.  NEUROLOGIC: No numbness, tingling, or ataxia. No seizure-type activity.  PSYCHIATRIC: No anxiety. No insomnia. No ADD.    Vitals:   Vitals:   01/16/17 1335 01/16/17 1340 01/16/17 1345 01/16/17 1401  BP:   127/64 (!) 116/57  Pulse: 91 88 87 88  Resp: (!) 21 16 18 13   Temp:      TempSrc:      SpO2: 96% 95% 97% 97%  Weight:      Height:        Wt Readings from Last 3 Encounters:  01/16/17 125 lb (56.7 kg)  01/11/17 126 lb 9.6 oz (57.4 kg)  12/14/16 128 lb 6.4 oz (58.2 kg)     Intake/Output Summary (Last 24 hours) at 01/16/17 1414 Last data filed at 01/16/17 0525  Gross per 24 hour  Intake           353.17 ml  Output             1500 ml   Net         -1146.83 ml    Physical Exam:   GENERAL:Mild respiratory distress HEAD, EYES, EARS, NOSE AND THROAT: Atraumatic, normocephalic. Extraocular muscles are intact. Pupils equal and reactive to light. Sclerae anicteric. No conjunctival injection. No oro-pharyngeal erythema.  NECK: Supple. There is no jugular venous distention. No bruits, no lymphadenopathy, no thyromegaly.  HEART: Regular rate and rhythm,. No murmurs, no rubs, no clicks.  LUNGS: Bilateral wheezing throughout both lungs ABDOMEN: Soft, flat, nontender, nondistended. Has good bowel sounds. No hepatosplenomegaly appreciated.  EXTREMITIES: No evidence of any cyanosis, clubbing, or peripheral edema.  +2 pedal and radial pulses bilaterally.  NEUROLOGIC: The patient is alert, awake, and oriented x3 with no focal motor or sensory deficits appreciated bilaterally.  SKIN: Moist and warm with no rashes appreciated.  Psych: Not anxious, depressed LN: No inguinal LN enlargement    Antibiotics   Anti-infectives    Start     Dose/Rate Route Frequency Ordered Stop   01/16/17 1800  azithromycin (ZITHROMAX) tablet 250 mg     250 mg Oral  Once 01/16/17 1343     01/14/17 1800  [MAR Hold]  cefTRIAXone (ROCEPHIN) 1 g in dextrose 5 % 50 mL IVPB     (MAR Hold since 01/16/17 1115)   1 g 100 mL/hr over 30 Minutes Intravenous Every 24 hours 01/13/17 2242     01/12/17 1800  azithromycin (ZITHROMAX) 500 mg in dextrose 5 % 250 mL IVPB  Status:  Discontinued     500 mg 250 mL/hr over 60 Minutes Intravenous Every 24 hours 01/11/17 2203 01/16/17 1343   01/12/17 1700  cefTRIAXone (ROCEPHIN) 1 g in dextrose 5 % 50 mL IVPB  Status:  Discontinued     1 g 100 mL/hr over 30 Minutes Intravenous Every 24 hours 01/11/17 2203 01/13/17 2242   01/11/17 2300  [MAR Hold]  valACYclovir (VALTREX) tablet 1,000 mg     (MAR Hold since 01/16/17 1115)   1,000 mg Oral 2 times daily 01/11/17 2248     01/11/17 2145  cefTRIAXone (ROCEPHIN) 1 g in dextrose 5 % 50  mL IVPB - Premix  Status:  Discontinued     1 g 100 mL/hr over 30 Minutes Intravenous  Once 01/11/17 2133 01/13/17 2241   01/11/17 2145  azithromycin (ZITHROMAX) 500 mg in dextrose 5 % 250 mL IVPB     500 mg 250 mL/hr over 60 Minutes Intravenous  Once 01/11/17 2133 01/12/17 0027      Medications   Scheduled Meds: . [MAR Hold] acetylcysteine  4 mL Nebulization BID  . [MAR Hold] aspirin  81 mg Oral Daily  . [MAR Hold] atorvastatin  40 mg Oral q1800  . azithromycin  250 mg Oral Once  . [MAR Hold] bisoprolol  2.5 mg Oral Daily  . [MAR Hold] budesonide (PULMICORT) nebulizer solution  0.5 mg Nebulization BID  . [MAR Hold] chlorhexidine  15 mL Mouth Rinse BID  . [MAR Hold] digoxin  0.125 mg Oral Daily  . [MAR Hold] enoxaparin (LOVENOX) injection  40 mg Subcutaneous Q24H  . [MAR Hold] escitalopram  10 mg Oral Daily  . [MAR Hold] insulin aspart  0-15 Units Subcutaneous TID WC  . [MAR Hold] insulin aspart  0-5 Units Subcutaneous QHS  . [MAR Hold] insulin aspart  3 Units Subcutaneous TID WC  . [MAR Hold] ipratropium-albuterol  3 mL Nebulization TID  . [MAR Hold] mouth rinse  15 mL Mouth Rinse q12n4p  . [MAR Hold] methylPREDNISolone (SOLU-MEDROL) injection  40 mg Intravenous Q12H  . [MAR Hold] midodrine  2.5 mg Oral TID WC  . [MAR Hold] pantoprazole  40 mg Oral Daily  . [MAR Hold] protein supplement shake  11 oz Oral BID BM  . [MAR Hold] sodium chloride flush  3 mL Intravenous Q12H  . sodium chloride flush  3 mL Intravenous Q12H  . [MAR Hold] valACYclovir  1,000 mg Oral BID   Continuous  Infusions: . [MAR Hold] sodium chloride    . sodium chloride 10 mL/hr at 01/16/17 0506  . sodium chloride    . [MAR Hold] cefTRIAXone (ROCEPHIN) IVPB 1 gram/50 mL D5W Stopped (01/15/17 1901)   PRN Meds:.[MAR Hold] sodium chloride, sodium chloride, [MAR Hold] acetaminophen **OR** [MAR Hold] acetaminophen, [MAR Hold] ALPRAZolam, [MAR Hold] guaiFENesin, [MAR Hold] ipratropium-albuterol, [MAR Hold]  ondansetron **OR** [MAR Hold] ondansetron (ZOFRAN) IV, [MAR Hold] sodium chloride flush, sodium chloride flush   Data Review:   Micro Results Recent Results (from the past 240 hour(s))  Culture, blood (routine x 2)     Status: None   Collection Time: 01/11/17 10:09 PM  Result Value Ref Range Status   Specimen Description BLOOD RFOA  Final   Special Requests   Final    BOTTLES DRAWN AEROBIC AND ANAEROBIC Blood Culture adequate volume   Culture NO GROWTH 5 DAYS  Final   Report Status 01/16/2017 FINAL  Final  Culture, blood (routine x 2)     Status: None   Collection Time: 01/11/17 10:09 PM  Result Value Ref Range Status   Specimen Description BLOOD LFOA  Final   Special Requests   Final    BOTTLES DRAWN AEROBIC AND ANAEROBIC Blood Culture adequate volume   Culture NO GROWTH 5 DAYS  Final   Report Status 01/16/2017 FINAL  Final  MRSA PCR Screening     Status: None   Collection Time: 01/12/17 12:08 AM  Result Value Ref Range Status   MRSA by PCR NEGATIVE NEGATIVE Final    Comment:        The GeneXpert MRSA Assay (FDA approved for NASAL specimens only), is one component of a comprehensive MRSA colonization surveillance program. It is not intended to diagnose MRSA infection nor to guide or monitor treatment for MRSA infections.   Respiratory Panel by PCR     Status: None   Collection Time: 01/12/17  9:00 AM  Result Value Ref Range Status   Adenovirus NOT DETECTED NOT DETECTED Final   Coronavirus 229E NOT DETECTED NOT DETECTED Final   Coronavirus HKU1 NOT DETECTED NOT DETECTED Final   Coronavirus NL63 NOT DETECTED NOT DETECTED Final   Coronavirus OC43 NOT DETECTED NOT DETECTED Final   Metapneumovirus NOT DETECTED NOT DETECTED Final   Rhinovirus / Enterovirus NOT DETECTED NOT DETECTED Final   Influenza A NOT DETECTED NOT DETECTED Final   Influenza B NOT DETECTED NOT DETECTED Final   Parainfluenza Virus 1 NOT DETECTED NOT DETECTED Final   Parainfluenza Virus 2 NOT DETECTED  NOT DETECTED Final   Parainfluenza Virus 3 NOT DETECTED NOT DETECTED Final   Parainfluenza Virus 4 NOT DETECTED NOT DETECTED Final   Respiratory Syncytial Virus NOT DETECTED NOT DETECTED Final   Bordetella pertussis NOT DETECTED NOT DETECTED Final   Chlamydophila pneumoniae NOT DETECTED NOT DETECTED Final   Mycoplasma pneumoniae NOT DETECTED NOT DETECTED Final    Comment: Performed at [facility], [place] [address] [place], [place] [address]  Culture, Urine     Status: Abnormal   Collection Time: 01/12/17 10:13 AM  Result Value Ref Range Status   Specimen Description URINE, RANDOM  Final   Special Requests NONE  Final   Culture MULTIPLE SPECIES PRESENT, SUGGEST RECOLLECTION (A)  Final   Report Status 01/13/2017 FINAL  Final    Radiology Reports Dg Chest 2 View  Result Date: 01/11/2017 CLINICAL DATA:  Cough.  History of COPD. EXAM: CHEST  2 VIEW COMPARISON:  None. FINDINGS: The  cardiac silhouette is mildly enlarged. No mediastinal or hilar masses. No evidence of adenopathy. Lungs are hyperexpanded. Prominent bronchovascular markings are noted in the lung bases most evident in the lower lobes. There is a paucity of vascular markings in the upper lobes consistent with emphysema. No evidence of pneumonia or pulmonary edema. No pleural effusion or pneumothorax. Skeletal structures are demineralized. There are mild compression fractures of four upper to midthoracic vertebra, likely old. IMPRESSION: 1. No acute cardiopulmonary disease. 2. COPD/emphysema. 3. Mild cardiomegaly. 4. Mild thoracic vertebral compression fractures, of unclear chronicity, but likely old if there is no current back pain. Electronically Signed   By: Lajean Manes M.D.   On: 01/11/2017 15:05   Dg Chest Port 1 View  Result Date: 01/13/2017 CLINICAL DATA:  Acute respiratory failure EXAM: PORTABLE CHEST 1 VIEW COMPARISON:  01/11/2017 FINDINGS: Cardiac shadow remains mildly enlarged. Tortuosity of thoracic aorta is noted.  Diffuse interstitial changes are again identified and stable from the prior exam. No focal infiltrate is seen. No bony abnormality is noted. IMPRESSION: Diffuse interstitial changes stable from the previous exam. Electronically Signed   By: Inez Catalina M.D.   On: 01/13/2017 06:20   Dg Chest Portable 1 View  Result Date: 01/11/2017 CLINICAL DATA:  Initial evaluation for acute respiratory distress. EXAM: PORTABLE CHEST 1 VIEW COMPARISON:  Prior radiograph from 06/10/2016. FINDINGS: Cardiomegaly.  Mediastinal silhouette normal. Lungs hyperinflated with underlying emphysematous changes. Superimposed bibasilar interstitial prominence with scattered Kerley B-lines, suggesting mild pulmonary interstitial edema. Superimposed infection not excluded. Blunting of the right costophrenic angle, suggestive of small effusion. No pneumothorax. No acute osseus abnormality. IMPRESSION: 1. Bibasilar interstitial congestion/prominence, suggesting mild pulmonary interstitial edema. Superimposed infection not excluded, and could be considered in the correct clinical setting. 2. Blunting of the right costophrenic angle, suggestive of small effusion. 3. Underlying emphysema. 4. Cardiomegaly. Electronically Signed   By: Jeannine Boga M.D.   On: 01/11/2017 21:02     CBC  Recent Labs Lab 01/11/17 2030 01/12/17 0350 01/14/17 0536 01/16/17 0354  WBC 13.6* 11.9* 12.0* 11.1*  HGB 16.0 14.4 13.4 14.8  HCT 48.1* 42.0 39.5 43.0  PLT 327 276 196 282  MCV 96.3 96.0 95.5 92.7  MCH 32.0 32.9 32.5 32.0  MCHC 33.2 34.2 34.0 34.5  RDW 14.4 13.7 13.9 13.7  LYMPHSABS 6.4*  --   --   --   MONOABS 0.7  --   --   --   EOSABS 1.1*  --   --   --   BASOSABS 0.2*  --   --   --     Chemistries   Recent Labs Lab 01/11/17 2030 01/12/17 0350 01/14/17 0536 01/15/17 0431 01/16/17 0354  NA 132* 135 132* 130* 132*  K 4.4 3.9 3.9 4.0 4.4  CL 97* 104 96* 90* 93*  CO2 23 24 27 31 31   GLUCOSE 388* 256* 125* 142* 134*  BUN 14  19 15  25* 26*  CREATININE 0.87 0.75 0.50 0.47 0.45  CALCIUM 9.3 8.2* 8.5* 8.5* 9.6  MG  --   --  1.8 1.9  --   AST  --   --   --   --  25  ALT  --   --   --   --  53  ALKPHOS  --   --   --   --  64  BILITOT  --   --   --   --  0.6   ------------------------------------------------------------------------------------------------------------------ estimated creatinine clearance is  44.8 mL/min (by C-G formula based on SCr of 0.45 mg/dL). ------------------------------------------------------------------------------------------------------------------ No results for input(s): HGBA1C in the last 72 hours. ------------------------------------------------------------------------------------------------------------------ No results for input(s): CHOL, HDL, LDLCALC, TRIG, CHOLHDL, LDLDIRECT in the last 72 hours. ------------------------------------------------------------------------------------------------------------------ No results for input(s): TSH, T4TOTAL, T3FREE, THYROIDAB in the last 72 hours.  Invalid input(s): FREET3 ------------------------------------------------------------------------------------------------------------------ No results for input(s): VITAMINB12, FOLATE, FERRITIN, TIBC, IRON, RETICCTPCT in the last 72 hours.  Coagulation profile  Recent Labs Lab 01/16/17 0354  INR 1.09    No results for input(s): DDIMER in the last 72 hours.  Cardiac Enzymes  Recent Labs Lab 01/12/17 0019 01/13/17 0227 01/13/17 0817  TROPONINI 0.54* 0.43* 0.34*   ------------------------------------------------------------------------------------------------------------------ Invalid input(s): POCBNP    Assessment & Plan  Patient's 83 year old with COPD with respiratory failure  1. Acute respiratory failure with hypercapnia (HCC) - Due to COPD exacerbation, As well as acute systolic CHF Continue therapy with antibiotics, nebulizer, Lasix on hold due to cath  2. Acute systolic  CHF Awaiting cardiac Results continued therapy otherwise  3. CAP (community acquired pneumonia) -changed to oral anabiotic     4.  Essential hypertension - low pressure intermittently low, midrodrine   5.  Diabetes mellitus without complication (HCC) - sliding scale insulin with corresponding glucose checks  6. Miscellaneous Lovenox for DVT prophylaxis     Code Status Orders        Start     Ordered   01/11/17 2248  Full code  Continuous     01/11/17 2248    Code Status History    Date Active Date Inactive Code Status Order ID Comments User Context   01/12/2016 10:12 AM 01/14/2016 12:46 AM Full Code [id number]  Saundra Shelling, MD Inpatient           Consults  intensvist  DVT Prophylaxis  Lovenox   Lab Results  Component Value Date   PLT 282 01/16/2017     Time Spent in minutes  59min Greater than 50% of time spent in care coordination and counseling patient regarding the condition and plan of care.   Dustin Flock M.D on 01/16/2017 at 2:13 PM  Between 7am to 6pm - Pager - [phone]  After 6pm go to [email or web address] - password [email or web address] [facility]   Office  [phone]

## 2017-01-17 ENCOUNTER — Telehealth: Payer: Self-pay | Admitting: Cardiovascular Disease

## 2017-01-17 ENCOUNTER — Inpatient Hospital Stay: Payer: Medicare Other

## 2017-01-17 DIAGNOSIS — I5021 Acute systolic (congestive) heart failure: Secondary | ICD-10-CM

## 2017-01-17 DIAGNOSIS — I5181 Takotsubo syndrome: Secondary | ICD-10-CM

## 2017-01-17 LAB — GLUCOSE, CAPILLARY
GLUCOSE-CAPILLARY: 105 mg/dL — AB (ref 65–99)
Glucose-Capillary: 107 mg/dL — ABNORMAL HIGH (ref 65–99)
Glucose-Capillary: 106 mg/dL — ABNORMAL HIGH (ref 65–99)
Glucose-Capillary: 96 mg/dL (ref 65–99)

## 2017-01-17 LAB — BASIC METABOLIC PANEL
ANION GAP: 7 (ref 5–15)
BUN: 21 mg/dL — AB (ref 6–20)
CALCIUM: 8.8 mg/dL — AB (ref 8.9–10.3)
CO2: 30 mmol/L (ref 22–32)
CREATININE: 0.51 mg/dL (ref 0.44–1.00)
Chloride: 97 mmol/L — ABNORMAL LOW (ref 101–111)
GFR calc Af Amer: >60 mL/min (ref >60)
GFR calc non Af Amer: >60 mL/min (ref >60)
GLUCOSE: 114 mg/dL — AB (ref 65–99)
Potassium: 4.1 mmol/L (ref 3.5–5.1)
SODIUM: 134 mmol/L — AB (ref 135–145)

## 2017-01-17 MED ORDER — PREDNISONE 50 MG PO TABS
50.0000 mg | ORAL_TABLET | Freq: Every day | ORAL | Status: DC
  Administered 2017-01-18: 50 mg via ORAL
  Filled 2017-01-17: qty 1

## 2017-01-17 MED ORDER — IOPAMIDOL (ISOVUE-370) INJECTION 76%
75.0000 mL | Freq: Once | INTRAVENOUS | Status: AC | PRN
  Administered 2017-01-17: 75 mL via INTRAVENOUS

## 2017-01-17 MED ORDER — LOSARTAN POTASSIUM 25 MG PO TABS
12.5000 mg | ORAL_TABLET | Freq: Every day | ORAL | Status: DC
  Administered 2017-01-17 – 2017-01-18 (×2): 12.5 mg via ORAL
  Filled 2017-01-17 (×2): qty 1

## 2017-01-17 MED ORDER — CEFUROXIME AXETIL 500 MG PO TABS
500.0000 mg | ORAL_TABLET | Freq: Two times a day (BID) | ORAL | Status: DC
  Administered 2017-01-17 – 2017-01-18 (×3): 500 mg via ORAL
  Filled 2017-01-17 (×3): qty 1

## 2017-01-17 MED ORDER — DOCUSATE SODIUM 100 MG PO CAPS
200.0000 mg | ORAL_CAPSULE | Freq: Two times a day (BID) | ORAL | Status: DC
  Administered 2017-01-17 – 2017-01-18 (×3): 200 mg via ORAL
  Filled 2017-01-17 (×3): qty 2

## 2017-01-17 MED ORDER — POLYETHYLENE GLYCOL 3350 17 G PO PACK
17.0000 g | PACK | Freq: Every day | ORAL | Status: DC
  Administered 2017-01-17 – 2017-01-18 (×2): 17 g via ORAL
  Filled 2017-01-17 (×2): qty 1

## 2017-01-17 NOTE — Progress Notes (Signed)
Progress Note  Patient Name: Devorah Givhan Date of Encounter: 01/17/2017  Primary Cardiologist: Johnny Bridge, MD   Subjective   Feeling much better.  Still with some chest congestion and cough but overall breathing improved.  R wrist healing well.  Inpatient Medications    Scheduled Meds: . acetylcysteine  4 mL Nebulization BID  . aspirin  81 mg Oral Daily  . atorvastatin  40 mg Oral q1800  . bisoprolol  2.5 mg Oral Daily  . budesonide (PULMICORT) nebulizer solution  0.5 mg Nebulization BID  . cefUROXime  500 mg Oral BID WC  . chlorhexidine  15 mL Mouth Rinse BID  . digoxin  0.125 mg Oral Daily  . docusate sodium  200 mg Oral BID  . enoxaparin (LOVENOX) injection  40 mg Subcutaneous Q24H  . escitalopram  10 mg Oral Daily  . insulin aspart  0-15 Units Subcutaneous TID WC  . insulin aspart  0-5 Units Subcutaneous QHS  . insulin aspart  3 Units Subcutaneous TID WC  . ipratropium-albuterol  3 mL Nebulization TID  . mouth rinse  15 mL Mouth Rinse q12n4p  . methylPREDNISolone (SOLU-MEDROL) injection  40 mg Intravenous Q12H  . pantoprazole  40 mg Oral Daily  . polyethylene glycol  17 g Oral Daily  . [START ON 01/18/2017] predniSONE  50 mg Oral Q breakfast  . protein supplement shake  11 oz Oral BID BM  . sodium chloride flush  3 mL Intravenous Q12H  . sodium chloride flush  3 mL Intravenous Q12H  . valACYclovir  1,000 mg Oral BID   Continuous Infusions: . sodium chloride    . sodium chloride 10 mL/hr at 01/16/17 0506  . sodium chloride     PRN Meds: sodium chloride, sodium chloride, acetaminophen **OR** acetaminophen, ALPRAZolam, guaiFENesin, ipratropium-albuterol, ondansetron **OR** ondansetron (ZOFRAN) IV, sodium chloride flush, sodium chloride flush   Vital Signs    Vitals:   01/17/17 0500 01/17/17 0734 01/17/17 0735 01/17/17 0754  BP:  134/65    Pulse:  83    Resp:      Temp:   98 F (36.7 C)   TempSrc:   Oral   SpO2:  95%  95%  Weight: 124 lb 9.6 oz (56.5 kg)      Height:        Intake/Output Summary (Last 24 hours) at 01/17/17 1216 Last data filed at 01/17/17 1001  Gross per 24 hour  Intake              249 ml  Output              231 ml  Net               18 ml   Filed Weights   01/16/17 0600 01/16/17 1125 01/17/17 0500  Weight: 125 lb 14.4 oz (57.1 kg) 125 lb (56.7 kg) 124 lb 9.6 oz (56.5 kg)    Physical Exam   GEN: Well nourished, well developed, in no acute distress.  HEENT: Grossly normal.  Neck: Supple, no JVD, carotid bruits, or masses. Cardiac: RRR, no murmurs, rubs, or gallops. No clubbing, cyanosis, edema.  Radials/DP/PT 2+ and equal bilaterally. r Wrist catheterization site without bleeding, bruit, or hematoma. Respiratory:  Respirations regular and unlabored, coarse breath sounds/rhonchi throughout. GI: Soft, nontender, nondistended, BS + x 4. MS: no deformity or atrophy. Skin: warm and dry, no rash. Neuro:  Strength and sensation are intact. Psych: AAOx3.  Normal affect.  Labs  Chemistry Recent Labs Lab 01/15/17 0431 01/16/17 0354 01/17/17 0559  NA 130* 132* 134*  K 4.0 4.4 4.1  CL 90* 93* 97*  CO2 31 31 30   GLUCOSE 142* 134* 114*  BUN 25* 26* 21*  CREATININE 0.47 0.45 0.51  CALCIUM 8.5* 9.6 8.8*  PROT  --  6.3*  --   ALBUMIN  --  3.5  --   AST  --  25  --   ALT  --  53  --   ALKPHOS  --  64  --   BILITOT  --  0.6  --   GFRNONAA >60 >60 >60  GFRAA >60 >60 >60  ANIONGAP 9 8 7      Hematology Recent Labs Lab 01/12/17 0350 01/14/17 0536 01/16/17 0354  WBC 11.9* 12.0* 11.1*  RBC 4.38 4.13 4.63  HGB 14.4 13.4 14.8  HCT 42.0 39.5 43.0  MCV 96.0 95.5 92.7  MCH 32.9 32.5 32.0  MCHC 34.2 34.0 34.5  RDW 13.7 13.9 13.7  PLT 276 196 282    Cardiac Enzymes Recent Labs Lab 01/11/17 2030 01/12/17 0019 01/13/17 0227 01/13/17 0817  TROPONINI 0.10* 0.54* 0.43* 0.34*      Radiology    No results found.  Telemetry    Regular sinus rhythm - Personally Reviewed  Cardiac Studies   2D  Echocardiogram 10.4.2018  Study Conclusions   - Left ventricle: The cavity size was normal. Systolic function was   severely reduced. The estimated ejection fraction was in the   range of 25% to 30%. Severe global hypokinesis, basal regions   moving well. Doppler parameters are consistent with abnormal left   ventricular relaxation (grade 1 diastolic dysfunction). - Mitral valve: There was mild regurgitation. - Left atrium: The atrium was mildly dilated. - Right ventricle: Systolic function was normal. - Tricuspid valve: There was mild-moderate regurgitation. - Pulmonary arteries: Systolic pressure was moderately elevated. PA   peak pressure: 55 mm Hg (S).   Impressions:   - Images concerning for stress cardiomyopathy given basal wall   regions are moving well with otherwise severe global hypokinesis. _____________   Cardiac Catheterization 10.8.2018  1. Normal coronary arteries. 2. Moderately reduced LV systolic function with an EF of 35-45% with hypokinesis of the distal segments suggestive of stress-induced cardiomyopathy. 3. Right heart catheterization showed normal filling pressures, mild pulmonary hypertension and normal cardiac output. _____________   Patient Profile     83 yo female admitted 10/3 with acute on chronic hypoxic hypercapnic respiratory failure secondary to atypical pulmonary infection vs. pulmonary interstitial edema and AECOPD requiring continuous Bipap, acute encephalopathy likely secondary to hypercapnia, and sepsis with hypotension requiring levophed gtt  subsequently d/c'd. Echo showed LV dysfxn with suggestion of Takotsubo. Cath 10/8 w/ nl cors and nl filling pressures.  Assessment & Plan    1. Acute on chronic hypoxic resp failure:  Multifactorial in setting AECOPD and acute syst/diast CHF. Volume stable with normal right heart pressures on catheterization on October 8. Still with rhonchi/coarse breath sounds. Nebulizers, steroids, and antibiotics per  internal medicine.  2.  Acute syst/Diast CHF:  Echo showed sev reduced EF w/ wma suggestive of Takotsub CM.  Cath 10/8 w/ nl cors and nl filling pressures. I/O minus 221 overnight, minus 7.3L for admission.  Wt stable @ 124 lbs. Cont bisoprolol.  Prev on entresto but became hypotensive.  Losartan 12.5 then started but d/c'd 10/7, in setting of soft blood pressures. Pressures higher over past 24 hrs - 1-teens to 130's.  Will resume low dose losartan (was on 100 daily prior to admission). If she tolerates, we can consider low dose spiro in near future. Plan f/u echo in 40 days to re-eval EF. Leave off of diuretics for now.  3.  Elevated Troponin:  Nl cors on cath.  Presumed demand ischemia.  LDL 84 w/o statin.  In setting of nl cors and prev elev of LFTs with simva, could likely d/c lipitor.  Signed, Murray Hodgkins, NP  01/17/2017, 12:16 PM    For questions or updates, please contact   Please consult [email or web address] for contact info under Cardiology/STEMI.

## 2017-01-17 NOTE — Telephone Encounter (Signed)
Patient daughter came to office to schedule tcm ph Cath appt   Patient is still admitted but will need a 7-10 day fu please advise .

## 2017-01-17 NOTE — Progress Notes (Signed)
[facility]                                                                                                                                                                                  Patient Demographics   Mary Howe, is a 83 y.o. female, DOB - 1933-02-11, AYT:[id number]  Admit date - 01/11/2017   Admitting Physician Lance Coon, MD  Outpatient Primary MD for the patient is [name], MD   LOS - 6  Subjective: Breathing improvedStill some dyspnea,   Review of Systems:   CONSTITUTIONAL: No documented fever. No fatigue, weakness. No weight gain, no weight loss.  EYES: No blurry or double vision.  ENT: No tinnitus. No postnasal drip. No redness of the oropharynx.  RESPIRATORY:  positive cough, no wheeze, no hemoptysis. Positive dyspnea.  CARDIOVASCULAR: No chest pain. No orthopnea. No palpitations. No syncope.  GASTROINTESTINAL: No nausea, no vomiting or diarrhea. No abdominal pain. No melena or hematochezia.  GENITOURINARY: No dysuria or hematuria.  ENDOCRINE: No polyuria or nocturia. No heat or cold intolerance.  HEMATOLOGY: No anemia. No bruising. No bleeding.  INTEGUMENTARY: No rashes. No lesions.  MUSCULOSKELETAL: No arthritis. No swelling. No gout.  NEUROLOGIC: No numbness, tingling, or ataxia. No seizure-type activity.  PSYCHIATRIC: No anxiety. No insomnia. No ADD.    Vitals:   Vitals:   01/17/17 0500 01/17/17 0734 01/17/17 0735 01/17/17 0754  BP:  134/65    Pulse:  83    Resp:      Temp:   98 F (36.7 C)   TempSrc:   Oral   SpO2:  95%  95%  Weight: 124 lb 9.6 oz (56.5 kg)     Height:        Wt Readings from Last 3 Encounters:  01/17/17 124 lb 9.6 oz (56.5 kg)  01/11/17 126 lb 9.6 oz (57.4 kg)  12/14/16 128 lb 6.4 oz (58.2 kg)     Intake/Output Summary (Last 24 hours) at 01/17/17 1421 Last data filed at 01/17/17 1001  Gross per 24 hour  Intake              249 ml  Output              231 ml  Net                18 ml    Physical Exam:   GENERAL:Mild respiratory distress HEAD, EYES, EARS, NOSE AND THROAT: Atraumatic, normocephalic. Extraocular muscles are intact. Pupils equal and reactive to light. Sclerae anicteric. No conjunctival injection. No oro-pharyngeal erythema.  NECK: Supple. There is no jugular venous distention. No bruits, no lymphadenopathy, no  thyromegaly.  HEART: Regular rate and rhythm,. No murmurs, no rubs, no clicks.  LUNGS: Bilateral wheezing throughout both lungs ABDOMEN: Soft, flat, nontender, nondistended. Has good bowel sounds. No hepatosplenomegaly appreciated.  EXTREMITIES: No evidence of any cyanosis, clubbing, or peripheral edema.  +2 pedal and radial pulses bilaterally.  NEUROLOGIC: The patient is alert, awake, and oriented x3 with no focal motor or sensory deficits appreciated bilaterally.  SKIN: Moist and warm with no rashes appreciated.  Psych: Not anxious, depressed LN: No inguinal LN enlargement    Antibiotics   Anti-infectives    Start     Dose/Rate Route Frequency Ordered Stop   01/17/17 1700  cefUROXime (CEFTIN) tablet 500 mg     500 mg Oral 2 times daily with meals 01/17/17 1117     01/16/17 1800  azithromycin (ZITHROMAX) tablet 250 mg     250 mg Oral  Once 01/16/17 1343 01/16/17 1800   01/14/17 1800  cefTRIAXone (ROCEPHIN) 1 g in dextrose 5 % 50 mL IVPB  Status:  Discontinued     1 g 100 mL/hr over 30 Minutes Intravenous Every 24 hours 01/13/17 2242 01/17/17 1117   01/12/17 1800  azithromycin (ZITHROMAX) 500 mg in dextrose 5 % 250 mL IVPB  Status:  Discontinued     500 mg 250 mL/hr over 60 Minutes Intravenous Every 24 hours 01/11/17 2203 01/16/17 1343   01/12/17 1700  cefTRIAXone (ROCEPHIN) 1 g in dextrose 5 % 50 mL IVPB  Status:  Discontinued     1 g 100 mL/hr over 30 Minutes Intravenous Every 24 hours 01/11/17 2203 01/13/17 2242   01/11/17 2300  valACYclovir (VALTREX) tablet 1,000 mg     1,000 mg Oral 2 times daily 01/11/17 2248      01/11/17 2145  cefTRIAXone (ROCEPHIN) 1 g in dextrose 5 % 50 mL IVPB - Premix  Status:  Discontinued     1 g 100 mL/hr over 30 Minutes Intravenous  Once 01/11/17 2133 01/13/17 2241   01/11/17 2145  azithromycin (ZITHROMAX) 500 mg in dextrose 5 % 250 mL IVPB     500 mg 250 mL/hr over 60 Minutes Intravenous  Once 01/11/17 2133 01/12/17 0027      Medications   Scheduled Meds: . acetylcysteine  4 mL Nebulization BID  . aspirin  81 mg Oral Daily  . atorvastatin  40 mg Oral q1800  . bisoprolol  2.5 mg Oral Daily  . budesonide (PULMICORT) nebulizer solution  0.5 mg Nebulization BID  . cefUROXime  500 mg Oral BID WC  . chlorhexidine  15 mL Mouth Rinse BID  . digoxin  0.125 mg Oral Daily  . docusate sodium  200 mg Oral BID  . enoxaparin (LOVENOX) injection  40 mg Subcutaneous Q24H  . escitalopram  10 mg Oral Daily  . insulin aspart  0-15 Units Subcutaneous TID WC  . insulin aspart  0-5 Units Subcutaneous QHS  . insulin aspart  3 Units Subcutaneous TID WC  . ipratropium-albuterol  3 mL Nebulization TID  . losartan  12.5 mg Oral Daily  . mouth rinse  15 mL Mouth Rinse q12n4p  . methylPREDNISolone (SOLU-MEDROL) injection  40 mg Intravenous Q12H  . pantoprazole  40 mg Oral Daily  . polyethylene glycol  17 g Oral Daily  . [START ON 01/18/2017] predniSONE  50 mg Oral Q breakfast  . protein supplement shake  11 oz Oral BID BM  . sodium chloride flush  3 mL Intravenous Q12H  . sodium chloride flush  3  mL Intravenous Q12H  . valACYclovir  1,000 mg Oral BID   Continuous Infusions: . sodium chloride    . sodium chloride 10 mL/hr at 01/16/17 0506  . sodium chloride     PRN Meds:.sodium chloride, sodium chloride, acetaminophen **OR** acetaminophen, ALPRAZolam, guaiFENesin, ipratropium-albuterol, ondansetron **OR** ondansetron (ZOFRAN) IV, sodium chloride flush, sodium chloride flush   Data Review:   Micro Results Recent Results (from the past 240 hour(s))  Culture, blood (routine x 2)      Status: None   Collection Time: 01/11/17 10:09 PM  Result Value Ref Range Status   Specimen Description BLOOD RFOA  Final   Special Requests   Final    BOTTLES DRAWN AEROBIC AND ANAEROBIC Blood Culture adequate volume   Culture NO GROWTH 5 DAYS  Final   Report Status 01/16/2017 FINAL  Final  Culture, blood (routine x 2)     Status: None   Collection Time: 01/11/17 10:09 PM  Result Value Ref Range Status   Specimen Description BLOOD LFOA  Final   Special Requests   Final    BOTTLES DRAWN AEROBIC AND ANAEROBIC Blood Culture adequate volume   Culture NO GROWTH 5 DAYS  Final   Report Status 01/16/2017 FINAL  Final  MRSA PCR Screening     Status: None   Collection Time: 01/12/17 12:08 AM  Result Value Ref Range Status   MRSA by PCR NEGATIVE NEGATIVE Final    Comment:        The GeneXpert MRSA Assay (FDA approved for NASAL specimens only), is one component of a comprehensive MRSA colonization surveillance program. It is not intended to diagnose MRSA infection nor to guide or monitor treatment for MRSA infections.   Respiratory Panel by PCR     Status: None   Collection Time: 01/12/17  9:00 AM  Result Value Ref Range Status   Adenovirus NOT DETECTED NOT DETECTED Final   Coronavirus 229E NOT DETECTED NOT DETECTED Final   Coronavirus HKU1 NOT DETECTED NOT DETECTED Final   Coronavirus NL63 NOT DETECTED NOT DETECTED Final   Coronavirus OC43 NOT DETECTED NOT DETECTED Final   Metapneumovirus NOT DETECTED NOT DETECTED Final   Rhinovirus / Enterovirus NOT DETECTED NOT DETECTED Final   Influenza A NOT DETECTED NOT DETECTED Final   Influenza B NOT DETECTED NOT DETECTED Final   Parainfluenza Virus 1 NOT DETECTED NOT DETECTED Final   Parainfluenza Virus 2 NOT DETECTED NOT DETECTED Final   Parainfluenza Virus 3 NOT DETECTED NOT DETECTED Final   Parainfluenza Virus 4 NOT DETECTED NOT DETECTED Final   Respiratory Syncytial Virus NOT DETECTED NOT DETECTED Final   Bordetella pertussis NOT  DETECTED NOT DETECTED Final   Chlamydophila pneumoniae NOT DETECTED NOT DETECTED Final   Mycoplasma pneumoniae NOT DETECTED NOT DETECTED Final    Comment: Performed at [facility], [place] [address] [place], [place] [address]  Culture, Urine     Status: Abnormal   Collection Time: 01/12/17 10:13 AM  Result Value Ref Range Status   Specimen Description URINE, RANDOM  Final   Special Requests NONE  Final   Culture MULTIPLE SPECIES PRESENT, SUGGEST RECOLLECTION (A)  Final   Report Status 01/13/2017 FINAL  Final    Radiology Reports Dg Chest 2 View  Result Date: 01/11/2017 CLINICAL DATA:  Cough.  History of COPD. EXAM: CHEST  2 VIEW COMPARISON:  None. FINDINGS: The cardiac silhouette is mildly enlarged. No mediastinal or hilar masses. No evidence of adenopathy. Lungs are hyperexpanded. Prominent bronchovascular markings  are noted in the lung bases most evident in the lower lobes. There is a paucity of vascular markings in the upper lobes consistent with emphysema. No evidence of pneumonia or pulmonary edema. No pleural effusion or pneumothorax. Skeletal structures are demineralized. There are mild compression fractures of four upper to midthoracic vertebra, likely old. IMPRESSION: 1. No acute cardiopulmonary disease. 2. COPD/emphysema. 3. Mild cardiomegaly. 4. Mild thoracic vertebral compression fractures, of unclear chronicity, but likely old if there is no current back pain. Electronically Signed   By: Lajean Manes M.D.   On: 01/11/2017 15:05   Ct Angio Chest Pe W Or Wo Contrast  Result Date: 01/17/2017 CLINICAL DATA:  Heart catheterization yesterday. Shortness of breath to day. EXAM: CT ANGIOGRAPHY CHEST WITH CONTRAST TECHNIQUE: Multidetector CT imaging of the chest was performed using the standard protocol during bolus administration of intravenous contrast. Multiplanar CT image reconstructions and MIPs were obtained to evaluate the vascular anatomy. CONTRAST:  75 mL Isovue 370 COMPARISON:   One-view chest x-ray 01/13/2017.  CTA chest 01/12/2016 FINDINGS: Cardiovascular: The heart size is normal. Atherosclerotic changes are present in the aortic arch and great vessel origins without focal stenosis or aneurysm. Pulmonary arterial opacification is excellent. There are no focal filling defects to suggest pulmonary embolus. Pulmonary arterial size is normal. Mediastinum/Nodes: No significant mediastinal or axillary adenopathy is present. Lungs/Pleura: Mild dependent atelectasis is present bilaterally. No focal nodule, mass, or airspace disease is present. Diffuse centrilobular emphysema is again noted. No significant pleural effusion is present. Upper Abdomen: Limited imaging of the upper abdomen is within normal limits. Musculoskeletal: Inferior endplate fracture at T3, superior endplate fracture T4, and superior endplate fracture at T5 are similar the prior study. There is progressive loss of height involving the superior endplate fracture of T7. No new fractures are present. Review of the MIP images confirms the above findings. IMPRESSION: 1. No evidence for pulmonary embolus. 2.  Aortic Atherosclerosis (ICD10-I70.0). 3.  Emphysema (ICD10-J43.9). 4. No acute or focal lesion to explain shortness of breath. 5. Progressive loss of height involving the superior endplate fracture at T7. Other thoracic fractures are stable. Electronically Signed   By: San Morelle M.D.   On: 01/17/2017 13:40   Dg Chest Port 1 View  Result Date: 01/13/2017 CLINICAL DATA:  Acute respiratory failure EXAM: PORTABLE CHEST 1 VIEW COMPARISON:  01/11/2017 FINDINGS: Cardiac shadow remains mildly enlarged. Tortuosity of thoracic aorta is noted. Diffuse interstitial changes are again identified and stable from the prior exam. No focal infiltrate is seen. No bony abnormality is noted. IMPRESSION: Diffuse interstitial changes stable from the previous exam. Electronically Signed   By: Inez Catalina M.D.   On: 01/13/2017 06:20    Dg Chest Portable 1 View  Result Date: 01/11/2017 CLINICAL DATA:  Initial evaluation for acute respiratory distress. EXAM: PORTABLE CHEST 1 VIEW COMPARISON:  Prior radiograph from 06/10/2016. FINDINGS: Cardiomegaly.  Mediastinal silhouette normal. Lungs hyperinflated with underlying emphysematous changes. Superimposed bibasilar interstitial prominence with scattered Kerley B-lines, suggesting mild pulmonary interstitial edema. Superimposed infection not excluded. Blunting of the right costophrenic angle, suggestive of small effusion. No pneumothorax. No acute osseus abnormality. IMPRESSION: 1. Bibasilar interstitial congestion/prominence, suggesting mild pulmonary interstitial edema. Superimposed infection not excluded, and could be considered in the correct clinical setting. 2. Blunting of the right costophrenic angle, suggestive of small effusion. 3. Underlying emphysema. 4. Cardiomegaly. Electronically Signed   By: Jeannine Boga M.D.   On: 01/11/2017 21:02     CBC  Recent Labs Lab 01/11/17  2030 01/12/17 0350 01/14/17 0536 01/16/17 0354  WBC 13.6* 11.9* 12.0* 11.1*  HGB 16.0 14.4 13.4 14.8  HCT 48.1* 42.0 39.5 43.0  PLT 327 276 196 282  MCV 96.3 96.0 95.5 92.7  MCH 32.0 32.9 32.5 32.0  MCHC 33.2 34.2 34.0 34.5  RDW 14.4 13.7 13.9 13.7  LYMPHSABS 6.4*  --   --   --   MONOABS 0.7  --   --   --   EOSABS 1.1*  --   --   --   BASOSABS 0.2*  --   --   --     Chemistries   Recent Labs Lab 01/12/17 0350 01/14/17 0536 01/15/17 0431 01/16/17 0354 01/17/17 0559  NA 135 132* 130* 132* 134*  K 3.9 3.9 4.0 4.4 4.1  CL 104 96* 90* 93* 97*  CO2 24 27 31 31 30   GLUCOSE 256* 125* 142* 134* 114*  BUN 19 15 25* 26* 21*  CREATININE 0.75 0.50 0.47 0.45 0.51  CALCIUM 8.2* 8.5* 8.5* 9.6 8.8*  MG  --  1.8 1.9  --   --   AST  --   --   --  25  --   ALT  --   --   --  53  --   ALKPHOS  --   --   --  64  --   BILITOT  --   --   --  0.6  --     ------------------------------------------------------------------------------------------------------------------ estimated creatinine clearance is 44.8 mL/min (by C-G formula based on SCr of 0.51 mg/dL). ------------------------------------------------------------------------------------------------------------------ No results for input(s): HGBA1C in the last 72 hours. ------------------------------------------------------------------------------------------------------------------ No results for input(s): CHOL, HDL, LDLCALC, TRIG, CHOLHDL, LDLDIRECT in the last 72 hours. ------------------------------------------------------------------------------------------------------------------ No results for input(s): TSH, T4TOTAL, T3FREE, THYROIDAB in the last 72 hours.  Invalid input(s): FREET3 ------------------------------------------------------------------------------------------------------------------ No results for input(s): VITAMINB12, FOLATE, FERRITIN, TIBC, IRON, RETICCTPCT in the last 72 hours.  Coagulation profile  Recent Labs Lab 01/16/17 0354  INR 1.09    No results for input(s): DDIMER in the last 72 hours.  Cardiac Enzymes  Recent Labs Lab 01/12/17 0019 01/13/17 0227 01/13/17 0817  TROPONINI 0.54* 0.43* 0.34*   ------------------------------------------------------------------------------------------------------------------ Invalid input(s): POCBNP    Assessment & Plan  Patient's 83 year old with COPD with respiratory failure  1. Acute respiratory failure with hypercapnia (HCC) - Due to COPD exacerbation, As well as acute systolic CHF Start prednisone taper, continue nebs and other treatment I had ordered a CT scan to further evaluate the bilateral changes CT scan is negative for PE has severe emphysematous changes  2. Acute systolic CHF Continue Zebeta, losartan I will add Lasix back and low-dose   3. CAP (community acquired pneumonia)continue oral  antibiotic     4.  Essential hypertension blood pressure now normal midrodrine stopped  5.  Diabetes mellitus without complication (HCC) - sliding scale insulin with corresponding glucose checks  6. Miscellaneous Lovenox for DVT prophylaxis     Code Status Orders        Start     Ordered   01/11/17 2248  Full code  Continuous     01/11/17 2248    Code Status History    Date Active Date Inactive Code Status Order ID Comments User Context   01/12/2016 10:12 AM 01/14/2016 12:46 AM Full Code [id number]  Saundra Shelling, MD Inpatient           Consults  intensvist  DVT Prophylaxis  Lovenox   Lab Results  Component  Value Date   PLT 282 01/16/2017     Time Spent in minutes  84min Greater than 50% of time spent in care coordination and counseling patient regarding the condition and plan of care.   Dustin Flock M.D on 01/17/2017 at 2:21 PM  Between 7am to 6pm - Pager - [phone]  After 6pm go to [email or web address] - password [email or web address] [facility]   Office  [phone]

## 2017-01-17 NOTE — Progress Notes (Signed)
PT is recommending home health. RN case manager aware of above. Please reconsult if future social work needs arise. CSW signing off.   Ireoluwa Gorsline, LCSW [phone]  

## 2017-01-17 NOTE — Telephone Encounter (Signed)
I do not see a discharge pending. Follow up would be scheduled by unit secretary and our office before discharge.

## 2017-01-17 NOTE — NC FL2 (Signed)
[place] LEVEL OF CARE SCREENING TOOL     IDENTIFICATION  Patient Name: Mary Howe Birthdate: 1932-05-14 Sex: female Admission Date (Current Location): 01/11/2017  [place] and [place] Number:  Engineering geologist and Address:  [facility], [address], [place], [place] [address]      Provider Number: [id number]  Attending Physician Name and Address:  Dustin Flock, MD  Relative Name and Phone Number:       Current Level of Care: Hospital Recommended Level of Care: [place] Prior Approval Number:    Date Approved/Denied:   PASRR Number:   [id number]  Discharge Plan: SNF    Current Diagnoses: Patient Active Problem List   Diagnosis Date Noted  . Acute systolic heart failure ([place])   . CAP (community acquired pneumonia) 01/11/2017  . Acute respiratory failure with hypoxia ([place]) 01/11/2017  . Elevated troponin 01/11/2017  . Statin intolerance 12/17/2016  . Left renal mass 11/25/2016  . S/P cholecystectomy 11/25/2016  . Elevated liver enzymes 11/12/2016  . Diabetes mellitus without complication ([place]) 56/21/3086  . Anxiety, generalized 06/22/2016  . Back pain 06/10/2016  . COPD with acute exacerbation ([place]) 06/10/2016  . Post-nasal drip 05/07/2016  . COPD (chronic obstructive pulmonary disease) ([place]) 02/06/2016  . History of shingles 01/30/2016  . Age-related osteoporosis with current pathol fracture of vertebra ([place]) 01/30/2016  . Essential hypertension 01/30/2016  . History of Graves' disease 01/30/2016  . Hospital discharge follow-up 01/30/2016  . Acute respiratory failure with hypercapnia ([place]) 01/12/2016  . DD (diverticular disease) 02/19/2015  . Acid reflux 02/19/2015  . 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 Hypercholesterolemia 02/19/2015  . Neuropathy 02/19/2015  . 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 Stasis, venous 02/19/2015    Orientation  RESPIRATION BLADDER Height & Weight     Self, Time, Situation, Place  O2 (Nasal Cannula 2L) Continent Weight: 124 lb 9.6 oz (56.5 kg) Height:  5\' 3"  (160 cm)  BEHAVIORAL SYMPTOMS/MOOD NEUROLOGICAL BOWEL NUTRITION STATUS      Continent Diet ([place] Healthy)  AMBULATORY STATUS COMMUNICATION OF NEEDS Skin   Extensive Assist Verbally Normal                       Personal Care Assistance Level of Assistance  Bathing, Feeding, Dressing Bathing Assistance: Limited assistance Feeding assistance: Independent Dressing Assistance: Limited assistance     Functional Limitations Info  Sight, Hearing, Speech Sight Info: Adequate Hearing Info: Impaired Speech Info: Adequate    SPECIAL CARE FACTORS FREQUENCY  PT (By licensed PT), OT (By licensed OT)     PT Frequency:  (5) OT Frequency:  (5)            Contractures      Additional Factors Info  Code Status, Allergies Code Status Info:  (Full Code) Allergies Info:  (SIMVASTATIN )           Current Medications (01/17/2017):  This is the current hospital active medication list Current Facility-Administered Medications  Medication Dose Route Frequency Provider Last Rate Last Dose  . 0.9 %  sodium chloride infusion  250 mL Intravenous PRN End, Harrell Gave, MD      . 0.9 %  sodium chloride infusion   Intravenous Continuous End, Harrell Gave, MD 10 mL/hr at 01/16/17 0506    . 0.9 %  sodium chloride infusion  250 mL Intravenous PRN [name] A, MD      . acetaminophen (TYLENOL) tablet 650 mg  650 mg Oral Q6H PRN Lance Coon, MD   650 mg at 01/14/17 2120   Or  . acetaminophen (TYLENOL) suppository 650 mg  650 mg Rectal Q6H PRN Lance Coon, MD      . acetylcysteine (MUCOMYST) 20 % nebulizer / oral solution 4 mL  4 mL Nebulization BID Dustin Flock, MD   4 mL at 01/17/17 0754  . ALPRAZolam Duanne Moron) tablet 0.25 mg  0.25 mg Oral BID PRN Lance Coon, MD      . aspirin chewable tablet 81 mg  81 mg Oral Daily Rogelia Mire, NP   81 mg at 01/17/17 7035  . atorvastatin (LIPITOR) tablet 40 mg  40 mg Oral q1800 Rogelia Mire, NP   40 mg at 01/16/17 1558  . bisoprolol (ZEBETA) tablet 2.5 mg  2.5 mg Oral Daily End, Christopher, MD   2.5 mg at 01/17/17 0922  . budesonide (PULMICORT) nebulizer solution 0.5 mg  0.5 mg Nebulization BID Flora Lipps, MD   0.5 mg at 01/17/17 0753  . cefUROXime (CEFTIN) tablet 500 mg  500 mg Oral BID WC Dustin Flock, MD      . chlorhexidine (PERIDEX) 0.12 % solution 15 mL  15 mL Mouth Rinse BID Awilda Bill, NP   15 mL at 01/17/17 0922  . digoxin (LANOXIN) tablet 0.125 mg  0.125 mg Oral Daily Dustin Flock, MD   0.125 mg at 01/17/17 0093  . docusate sodium (COLACE) capsule 200 mg  200 mg Oral BID Dustin Flock, MD   200 mg at 01/17/17 1126  . enoxaparin (LOVENOX) injection 40 mg  40 mg Subcutaneous Q24H Lance Coon, MD   Stopped at 01/16/17 1953  . escitalopram (LEXAPRO) tablet 10 mg  10 mg Oral Daily Lance Coon, MD   10 mg at 01/17/17 8182  . guaiFENesin (ROBITUSSIN) 100 MG/5ML solution 200 mg  10 mL Oral Q4H PRN Dustin Flock, MD   200 mg at 01/15/17 2234  . insulin aspart (novoLOG) injection 0-15 Units  0-15 Units Subcutaneous TID WC Flora Lipps, MD   3 Units at 01/15/17 1210  . insulin aspart (novoLOG) injection 0-5 Units  0-5 Units Subcutaneous QHS Flora Lipps, MD      . insulin aspart (novoLOG) injection 3 Units  3 Units Subcutaneous TID WC Flora Lipps, MD   3 Units at 01/17/17 1148  . ipratropium-albuterol (DUONEB) 0.5-2.5 (3) MG/3ML nebulizer solution 3 mL  3 mL Nebulization Q2H PRN Lance Coon, MD   3 mL at 01/16/17 2046  . ipratropium-albuterol (DUONEB) 0.5-2.5 (3) MG/3ML nebulizer solution 3 mL  3 mL Nebulization TID Dustin Flock, MD   3 mL at 01/17/17 0753  . losartan (COZAAR) tablet 12.5 mg  12.5 mg Oral Daily Rogelia Mire, NP      . MEDLINE mouth rinse  15 mL Mouth Rinse q12n4p Awilda Bill, NP   15 mL at 01/14/17 1600  .  methylPREDNISolone sodium succinate (SOLU-MEDROL) 40 mg/mL injection 40 mg  40 mg Intravenous Q12H Flora Lipps, MD   40 mg at 01/17/17 0510  . ondansetron (ZOFRAN) tablet 4 mg  4 mg Oral Q6H PRN Lance Coon, MD       Or  . ondansetron [facility]) injection 4 mg  4 mg Intravenous Q6H PRN Lance Coon, MD      . pantoprazole (PROTONIX) EC tablet 40 mg  40 mg Oral Daily Lance Coon, MD   40 mg at 01/17/17 9937  . polyethylene glycol (MIRALAX / GLYCOLAX) packet  17 g  17 g Oral Daily Dustin Flock, MD   17 g at 01/17/17 1126  . [START ON 01/18/2017] predniSONE (DELTASONE) tablet 50 mg  50 mg Oral Q breakfast Dustin Flock, MD      . protein supplement (PREMIER PROTEIN) liquid  11 oz Oral BID BM Dustin Flock, MD   11 oz at 01/17/17 1400  . sodium chloride flush (NS) 0.9 % injection 3 mL  3 mL Intravenous Q12H End, Christopher, MD   3 mL at 01/17/17 0925  . sodium chloride flush (NS) 0.9 % injection 3 mL  3 mL Intravenous PRN End, Harrell Gave, MD   3 mL at 01/17/17 0924  . sodium chloride flush (NS) 0.9 % injection 3 mL  3 mL Intravenous Q12H Wellington Hampshire, MD   3 mL at 01/17/17 0924  . sodium chloride flush (NS) 0.9 % injection 3 mL  3 mL Intravenous PRN Wellington Hampshire, MD      . valACYclovir (VALTREX) tablet 1,000 mg  1,000 mg Oral BID Lance Coon, MD   1,000 mg at 01/12/17 1914     Discharge Medications: Please see discharge summary for a list of discharge medications.  Relevant Imaging Results:  Relevant Lab Results:   Additional Information  (SSN: [id number])  Smith Mince, Student-Social Work

## 2017-01-17 NOTE — Evaluation (Signed)
Physical Therapy Evaluation Patient Details Name: Mary Howe MRN: [id number] DOB: 12/21/1932 Today's Date: 01/17/2017   History of Present Illness  Pt is an 83 y.o. female with a PMH including COPD, diverticulitis, GERD, hyperlipidemia and thyroid disease. Presented to ED on 10/3 for respiratory distress and AMS secondary to COPD exacerbation, CAP, HTN, diabetes and acute systolic CHF. Pt received R radial cardiac catheterization on 01/16/17; experienced SOB following procedure; CT negative for any acute chest abnormalties.  Clinical Impression  Prior to hospital admission, pt was independent with all ADLs, shopping, driving; uses O2 at home on 2L prn; no AD use for mobility.  Pt lives in a two-level [place] with her older sister; family and neighbors nearby and available for help prn.  Currently pt has generalized weakness and balance deficits; modified independent for bed mobility, min guard for transfers and gait. Pt required handheld assist for balance for walking 52ft x2 trials. Pt O2 sats monitored throughout session; 100% beginning of session, 99% following first ambulation trial, 96% following second ambulation trial with quick recovery back to 100% on 2L O2 via nasal cannula.  Pt would benefit from skilled PT to address noted impairments and functional limitations (see below for any additional details).  Upon hospital discharge, recommend pt discharge to home health PT; pt may benefit from outpatient cardiopulmonary rehab program.     Follow Up Recommendations Home health PT    Equipment Recommendations  Cane (Pt owns PT recommended DME)    Recommendations for Other Services       Precautions / Restrictions Precautions Precautions: Fall Restrictions Weight Bearing Restrictions: Yes Other Position/Activity Restrictions: RUE Radial cardiac catheter site WB precautions x48hrs following procedure      Mobility  Bed Mobility Overal bed mobility: Needs Assistance Bed Mobility:  Supine to Sit     Supine to sit: Modified independent (Device/Increase time);HOB elevated     General bed mobility comments: vc's for NWB through RUE to maintain radial catheter precautions  Transfers Overall transfer level: Needs assistance Equipment used: 1 person hand held assist Transfers: Sit to/from Stand Sit to Stand: Min guard         General transfer comment: first sit to stand trial, pt unable to gain standing balance and sat back down immediately; second trial successful; vc's for NWB through RUE, handheld assist x1 for balance and safety  Ambulation/Gait Ambulation/Gait assistance: Min guard Ambulation Distance (Feet): 80 Feet (80 ft x2 trials) Assistive device: 1 person hand held assist Gait Pattern/deviations: WFL(Within Functional Limits) Gait velocity: decreased Gait velocity interpretation: Below normal speed for age/gender    Stairs            Wheelchair Mobility    Modified Rankin (Stroke Patients Only)       Balance Overall balance assessment: Needs assistance Sitting-balance support: Feet supported Sitting balance-Leahy Scale: Good Sitting balance - Comments: able to maintain balance with BLE elevated and resisted during MMT; able to reach outside of BOS with feet supported   Standing balance support: Single extremity supported Standing balance-Leahy Scale: Fair Standing balance comment: Able to maintain standing balance with UUE support handheld assist                             Pertinent Vitals/Pain Pain Assessment: No/denies pain    Home Living Family/patient expects to be discharged to:: Private residence Living Arrangements: Other relatives (lives with older sister) Available Help at Discharge: Friend(s);Family Type of Home: Other(Comment) (condo)  Home Access: Stairs to enter   Entrance Stairs-Number of Steps: 1 Home Layout: Two level Home Equipment: Cane - single point;Shower seat;Grab bars -  tub/shower Additional Comments: wooden walking sticks    Prior Function Level of Independence: Independent         Comments: Prior to hospital admission, pt was independent with all ADLs, shopping and driving. She did not use any AD for mobility and was able to navigate stairs in her condo without issues. Pt reports she "takes care of her sister more than her sister takes care of her" but sister would still be able to help at d/c, as well as nearby family/neighbors.     Hand Dominance        Extremity/Trunk Assessment   Upper Extremity Assessment Upper Extremity Assessment: Generalized weakness (BUE at least 3/5, full ROM; unable to fully assess d/t radial cardiac catheter precautions)    Lower Extremity Assessment Lower Extremity Assessment: Generalized weakness (B hip flexion 4-/5, B knee flexion, extension, DF, PF 5/5; full ROM)    Cervical / Trunk Assessment Cervical / Trunk Assessment: Normal  Communication   Communication: No difficulties  Cognition Arousal/Alertness: Awake/alert Behavior During Therapy: WFL for tasks assessed/performed Overall Cognitive Status: Within Functional Limits for tasks assessed                                        General Comments      Exercises     Assessment/Plan    PT Assessment Patient needs continued PT services  PT Problem List Decreased strength;Decreased activity tolerance;Decreased balance;Decreased mobility       PT Treatment Interventions Gait training;Stair training;Functional mobility training;Therapeutic activities;Therapeutic exercise;Balance training;Patient/family education    PT Goals (Current goals can be found in the Care Plan section)  Acute Rehab PT Goals Patient Stated Goal: to be able to go home PT Goal Formulation: With patient Time For Goal Achievement: 01/31/17 Potential to Achieve Goals: Good    Frequency Min 2X/week   Barriers to discharge        Co-evaluation                AM-PAC PT "6 Clicks" Daily Activity  Outcome Measure Difficulty turning over in bed (including adjusting bedclothes, sheets and blankets)?: None Difficulty moving from lying on back to sitting on the side of the bed? : None Difficulty sitting down on and standing up from a chair with arms (e.g., wheelchair, bedside commode, etc,.)?: A Little Help needed moving to and from a bed to chair (including a wheelchair)?: A Little Help needed walking in hospital room?: A Little Help needed climbing 3-5 steps with a railing? : A Little 6 Click Score: 20    End of Session Equipment Utilized During Treatment: Gait belt;Oxygen Activity Tolerance: Patient tolerated treatment well Patient left: in chair;with call bell/phone within reach;with chair alarm set;with nursing/sitter in room Nurse Communication: Mobility status PT Visit Diagnosis: Muscle weakness (generalized) (M62.81);Unsteadiness on feet (R26.81)    Time: 0175-1025 PT Time Calculation (min) (ACUTE ONLY): 40 min   Charges:         PT G CodesWetzel Bjornstad, SPT 01/17/2017, 3:15 PM

## 2017-01-18 LAB — GLUCOSE, CAPILLARY
Glucose-Capillary: 97 mg/dL (ref 65–99)
Glucose-Capillary: 110 mg/dL — ABNORMAL HIGH (ref 65–99)
Glucose-Capillary: 144 mg/dL — ABNORMAL HIGH (ref 65–99)

## 2017-01-18 LAB — CREATININE, SERUM: CREATININE: 0.59 mg/dL (ref 0.44–1.00)

## 2017-01-18 MED ORDER — CEFUROXIME AXETIL 500 MG PO TABS: 500.0000 mg | ORAL_TABLET | Freq: Two times a day (BID) | ORAL | 0 refills | Status: DC

## 2017-01-18 MED ORDER — PREDNISONE 10 MG (21) PO TBPK
ORAL_TABLET | ORAL | 0 refills | Status: DC
Start: 2017-01-18 — End: 2017-01-18

## 2017-01-18 MED ORDER — BUDESONIDE 0.5 MG/2ML IN SUSP: 0.5000 mg | Freq: Two times a day (BID) | RESPIRATORY_TRACT | 0 refills | Status: DC

## 2017-01-18 MED ORDER — GUAIFENESIN 100 MG/5ML PO SOLN
10.0000 mL | ORAL | 0 refills | Status: DC | PRN
Start: 2017-01-18 — End: 2018-06-18

## 2017-01-18 MED ORDER — BISOPROLOL FUMARATE 5 MG PO TABS: 2.5000 mg | ORAL_TABLET | Freq: Every day | ORAL | 0 refills | Status: DC

## 2017-01-18 MED ORDER — LEVOFLOXACIN 750 MG PO TABS: 750.0000 mg | ORAL_TABLET | Freq: Every day | ORAL | 0 refills | Status: AC

## 2017-01-18 MED ORDER — FUROSEMIDE 20 MG PO TABS: 20.0000 mg | ORAL_TABLET | Freq: Two times a day (BID) | ORAL | 11 refills | Status: DC

## 2017-01-18 NOTE — Progress Notes (Signed)
Physical Therapy Treatment Patient Details Name: Mary Howe MRN: [id number] DOB: Sep 04, 1932 Today's Date: 01/18/2017    History of Present Illness Pt is an 83 y.o. female with a PMH including COPD, diverticulitis, GERD, hyperlipidemia and thyroid disease. Presented to ED on 10/3 for respiratory distress and AMS secondary to COPD exacerbation, CAP, HTN, diabetes and acute systolic CHF. Pt received R radial cardiac catheterization on 01/16/17; experienced SOB following procedure; CT negative for any acute chest abnormalties.    PT Comments    Pt able to progress ambulation distance; walked 20ft x2 with intermittent cane use for balance support, min guard for safety. Slight LOB/trunk sway to L at end of second gait trial; self corrected with side-stepping strategy.  Also able to navigate 4 steps with bilateral railings without LOB; min guard for safety. Continuous O2 use throughout session on 2L through [place]; O2 sats monitored throughout session, ranged 94-100%. Next session plan to progress activity as tolerable.  Follow Up Recommendations  Home health PT     Equipment Recommendations  Cane    Recommendations for Other Services       Precautions / Restrictions Precautions Precautions: Fall Restrictions Weight Bearing Restrictions: Yes Other Position/Activity Restrictions: RUE Radial cardiac catheter site WB precautions x48hrs following procedure    Mobility  Bed Mobility               General bed mobility comments: deferred next session; pt in recliner beginning and end of session  Transfers Overall transfer level: Needs assistance Equipment used: Straight cane Transfers: Sit to/from Stand Sit to Stand: Min guard         General transfer comment: used bed rail next to chair for assistance with sit to stand; min guard for safety  Ambulation/Gait Ambulation/Gait assistance: Min guard Ambulation Distance (Feet): 120 Feet (7ft x2) Assistive device: Straight  cane Gait Pattern/deviations: WFL(Within Functional Limits) Gait velocity: decreased   General Gait Details: intermittent cane use in L hand; slight LOB, trunk sway to L end of second trial   Stairs Stairs: Yes   Stair Management: Two rails Number of Stairs: 4 General stair comments: alternating step pattern; able to navigate stairs without LOB; min guard for safety  Wheelchair Mobility    Modified Rankin (Stroke Patients Only)       Balance Overall balance assessment: Needs assistance Sitting-balance support: Feet supported Sitting balance-Leahy Scale: Good Sitting balance - Comments: able to maintain seated balance with BLE elevated   Standing balance support: Single extremity supported Standing balance-Leahy Scale: Fair Standing balance comment: balance maintain during gait with intermittent cane use; slight LOB/trunk sway to L end of second gait trial                            Cognition Arousal/Alertness: Awake/alert Behavior During Therapy: WFL for tasks assessed/performed Overall Cognitive Status: Within Functional Limits for tasks assessed                                        Exercises      General Comments        Pertinent Vitals/Pain Pain Assessment: No/denies pain    Home Living                      Prior Function            PT Goals (  current goals can now be found in the care plan section) Acute Rehab PT Goals Patient Stated Goal: to be able to go home PT Goal Formulation: With patient Time For Goal Achievement: 01/31/17 Potential to Achieve Goals: Good Progress towards PT goals: Progressing toward goals    Frequency    Min 2X/week      PT Plan      Co-evaluation              AM-PAC PT "6 Clicks" Daily Activity  Outcome Measure  Difficulty turning over in bed (including adjusting bedclothes, sheets and blankets)?: None Difficulty moving from lying on back to sitting on the side of  the bed? : None Difficulty sitting down on and standing up from a chair with arms (e.g., wheelchair, bedside commode, etc,.)?: A Little Help needed moving to and from a bed to chair (including a wheelchair)?: A Little Help needed walking in hospital room?: A Little Help needed climbing 3-5 steps with a railing? : A Little 6 Click Score: 20    End of Session Equipment Utilized During Treatment: Gait belt;Oxygen Activity Tolerance: Patient tolerated treatment well Patient left: in chair;with call bell/phone within reach;with chair alarm set Nurse Communication: Mobility status PT Visit Diagnosis: Muscle weakness (generalized) (M62.81);Unsteadiness on feet (R26.81)     Time: 4270-6237 PT Time Calculation (min) (ACUTE ONLY): 24 min  Charges:                       G CodesWetzel Bjornstad, SPT 01/18/2017, 11:31 AM

## 2017-01-18 NOTE — Discharge Summary (Signed)
[place] at [facility], 83 y.o., DOB April 05, 1933, MRN [id number]. Admission date: 01/11/2017 Discharge Date 01/18/2017 Primary MD [name], MD Admitting Physician Lance Coon, MD  Admission Diagnosis  Respiratory distress [R06.03] CO2 narcosis [R06.89] Acute respiratory failure with hypoxia and hypercapnia (HCC) [J96.01, J96.02] Sepsis, due to unspecified organism [facility]) [A41.9]  Discharge Diagnosis   Principal Problem: Acute respiratory failure with hypercapnia (HCC) Acute COPD with acute exacerbation (HCC) Acute systolic CHF this is new onset  - stress induced CM Diabetes mellitus without complication ([place]) CAP (community acquired pneumonia) Elevated troponin due to demand ischemia Diabetes type 2       Hospital Course  Patient is 83 year old white female with history of COPD presented with worsening shortness of breath. Patient had to be placed on BiPAP. Chest x-ray reveals congestive heart failure as well as possible pneumonia. Patient was treated for acute on chronic COPD exasperation as well as pneumonia. Pt was slow to improve. Now she is on 2 L of oxygen which she will continue. Patient was diuresed well with Lasix. Her CT scan after diuresis showed severe COPD. Patient's breathing is much improved. Patient had a echocardiogram which showed new systolic dysfunction therefore she underwent a cardiac catheter which was normal. 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 Patient will be treated for CHF as outpatient and follow up with cardiology.            Consults  cardiology  Significant Tests:  See full reports for all details     Dg Chest 2 View  Result Date: 01/11/2017 CLINICAL DATA:  Cough.  History of COPD. EXAM: CHEST  2 VIEW COMPARISON:  None. FINDINGS: The cardiac silhouette is mildly enlarged. No mediastinal or hilar masses. No evidence of adenopathy. Lungs are hyperexpanded.  Prominent bronchovascular markings are noted in the lung bases most evident in the lower lobes. There is a paucity of vascular markings in the upper lobes consistent with emphysema. No evidence of pneumonia or pulmonary edema. No pleural effusion or pneumothorax. Skeletal structures are demineralized. There are mild compression fractures of four upper to midthoracic vertebra, likely old. IMPRESSION: 1. No acute cardiopulmonary disease. 2. COPD/emphysema. 3. Mild cardiomegaly. 4. Mild thoracic vertebral compression fractures, of unclear chronicity, but likely old if there is no current back pain. Electronically Signed   By: Lajean Manes M.D.   On: 01/11/2017 15:05   Ct Angio Chest Pe W Or Wo Contrast  Result Date: 01/17/2017 CLINICAL DATA:  Heart catheterization yesterday. Shortness of breath to day. EXAM: CT ANGIOGRAPHY CHEST WITH CONTRAST TECHNIQUE: Multidetector CT imaging of the chest was performed using the standard protocol during bolus administration of intravenous contrast. Multiplanar CT image reconstructions and MIPs were obtained to evaluate the vascular anatomy. CONTRAST:  75 mL Isovue 370 COMPARISON:  One-view chest x-ray 01/13/2017.  CTA chest 01/12/2016 FINDINGS: Cardiovascular: The heart size is normal. Atherosclerotic changes are present in the aortic arch and great vessel origins without focal stenosis or aneurysm. Pulmonary arterial opacification is excellent. There are no focal filling defects to suggest pulmonary embolus. Pulmonary arterial size is normal. Mediastinum/Nodes: No significant mediastinal or axillary adenopathy is present. Lungs/Pleura: Mild dependent atelectasis is present bilaterally. No focal nodule, mass, or airspace disease is present. Diffuse centrilobular emphysema is again noted. No significant pleural effusion is present. Upper Abdomen: Limited imaging of the upper abdomen is within normal limits. Musculoskeletal: Inferior endplate fracture at T3, superior endplate  fracture T4, and  superior endplate fracture at T5 are similar the prior study. There is progressive loss of height involving the superior endplate fracture of T7. No new fractures are present. Review of the MIP images confirms the above findings. IMPRESSION: 1. No evidence for pulmonary embolus. 2.  Aortic Atherosclerosis (ICD10-I70.0). 3.  Emphysema (ICD10-J43.9). 4. No acute or focal lesion to explain shortness of breath. 5. Progressive loss of height involving the superior endplate fracture at T7. Other thoracic fractures are stable. Electronically Signed   By: San Morelle M.D.   On: 01/17/2017 13:40   Dg Chest Port 1 View  Result Date: 01/13/2017 CLINICAL DATA:  Acute respiratory failure EXAM: PORTABLE CHEST 1 VIEW COMPARISON:  01/11/2017 FINDINGS: Cardiac shadow remains mildly enlarged. Tortuosity of thoracic aorta is noted. Diffuse interstitial changes are again identified and stable from the prior exam. No focal infiltrate is seen. No bony abnormality is noted. IMPRESSION: Diffuse interstitial changes stable from the previous exam. Electronically Signed   By: Inez Catalina M.D.   On: 01/13/2017 06:20   Dg Chest Portable 1 View  Result Date: 01/11/2017 CLINICAL DATA:  Initial evaluation for acute respiratory distress. EXAM: PORTABLE CHEST 1 VIEW COMPARISON:  Prior radiograph from 06/10/2016. FINDINGS: Cardiomegaly.  Mediastinal silhouette normal. Lungs hyperinflated with underlying emphysematous changes. Superimposed bibasilar interstitial prominence with scattered Kerley B-lines, suggesting mild pulmonary interstitial edema. Superimposed infection not excluded. Blunting of the right costophrenic angle, suggestive of small effusion. No pneumothorax. No acute osseus abnormality. IMPRESSION: 1. Bibasilar interstitial congestion/prominence, suggesting mild pulmonary interstitial edema. Superimposed infection not excluded, and could be considered in the correct clinical setting. 2. Blunting of the  right costophrenic angle, suggestive of small effusion. 3. Underlying emphysema. 4. Cardiomegaly. Electronically Signed   By: Jeannine Boga M.D.   On: 01/11/2017 21:02       Today   Subjective:   Mary Howe patient's breathing much improved  Objective:   Blood pressure (!) 114/53, pulse 76, temperature 97.6 F (36.4 C), temperature source Oral, resp. rate 19, height 5\' 3"  (1.6 m), weight 124 lb 9.6 oz (56.5 kg), SpO2 94 %.  .  Intake/Output Summary (Last 24 hours) at 01/18/17 1241 Last data filed at 01/18/17 0950  Gross per 24 hour  Intake              363 ml  Output              600 ml  Net             -237 ml    Exam VITAL SIGNS: Blood pressure (!) 114/53, pulse 76, temperature 97.6 F (36.4 C), temperature source Oral, resp. rate 19, height 5\' 3"  (1.6 m), weight 124 lb 9.6 oz (56.5 kg), SpO2 94 %.  GENERAL:  83 y.o.-year-old patient lying in the bed with no acute distress.  EYES: Pupils equal, round, reactive to light and accommodation. No scleral icterus. Extraocular muscles intact.  HEENT: Head atraumatic, normocephalic. Oropharynx and nasopharynx clear.  NECK:  Supple, no jugular venous distention. No thyroid enlargement, no tenderness.  LUNGS: Normal breath sounds bilaterally, no wheezing, rales,rhonchi or crepitation. No use of accessory muscles of respiration.  CARDIOVASCULAR: S1, S2 normal. No murmurs, rubs, or gallops.  ABDOMEN: Soft, nontender, nondistended. Bowel sounds present. No organomegaly or mass.  EXTREMITIES: No pedal edema, cyanosis, or clubbing.  NEUROLOGIC: Cranial nerves II through XII are intact. Muscle strength 5/5 in all extremities. Sensation intact. Gait not checked.  PSYCHIATRIC: The patient is alert and oriented x  3.  SKIN: No obvious rash, lesion, or ulcer.   Data Review     CBC w Diff:  Lab Results  Component Value Date   WBC 11.1 (H) 01/16/2017   HGB 14.8 01/16/2017   HGB 14.6 03/23/2015   HCT 43.0 01/16/2017   HCT 43.3  03/23/2015   PLT 282 01/16/2017   PLT 246 03/23/2015   LYMPHOPCT 48 01/11/2017   MONOPCT 5 01/11/2017   EOSPCT 8 01/11/2017   BASOPCT 1 01/11/2017   CMP:  Lab Results  Component Value Date   NA 134 (L) 01/17/2017   NA 139 03/23/2015   K 4.1 01/17/2017   CL 97 (L) 01/17/2017   CO2 30 01/17/2017   BUN 21 (H) 01/17/2017   BUN 7 (L) 03/23/2015   CREATININE 0.59 01/18/2017   GLU 102 01/14/2014   PROT 6.3 (L) 01/16/2017   PROT 7.0 03/23/2015   ALBUMIN 3.5 01/16/2017   ALBUMIN 4.5 03/23/2015   BILITOT 0.6 01/16/2017   BILITOT 0.5 03/23/2015   ALKPHOS 64 01/16/2017   AST 25 01/16/2017   ALT 53 01/16/2017  .  Micro Results Recent Results (from the past 240 hour(s))  Culture, blood (routine x 2)     Status: None   Collection Time: 01/11/17 10:09 PM  Result Value Ref Range Status   Specimen Description BLOOD RFOA  Final   Special Requests   Final    BOTTLES DRAWN AEROBIC AND ANAEROBIC Blood Culture adequate volume   Culture NO GROWTH 5 DAYS  Final   Report Status 01/16/2017 FINAL  Final  Culture, blood (routine x 2)     Status: None   Collection Time: 01/11/17 10:09 PM  Result Value Ref Range Status   Specimen Description BLOOD LFOA  Final   Special Requests   Final    BOTTLES DRAWN AEROBIC AND ANAEROBIC Blood Culture adequate volume   Culture NO GROWTH 5 DAYS  Final   Report Status 01/16/2017 FINAL  Final  MRSA PCR Screening     Status: None   Collection Time: 01/12/17 12:08 AM  Result Value Ref Range Status   MRSA by PCR NEGATIVE NEGATIVE Final    Comment:        The GeneXpert MRSA Assay (FDA approved for NASAL specimens only), is one component of a comprehensive MRSA colonization surveillance program. It is not intended to diagnose MRSA infection nor to guide or monitor treatment for MRSA infections.   Respiratory Panel by PCR     Status: None   Collection Time: 01/12/17  9:00 AM  Result Value Ref Range Status   Adenovirus NOT DETECTED NOT DETECTED Final    Coronavirus 229E NOT DETECTED NOT DETECTED Final   Coronavirus HKU1 NOT DETECTED NOT DETECTED Final   Coronavirus NL63 NOT DETECTED NOT DETECTED Final   Coronavirus OC43 NOT DETECTED NOT DETECTED Final   Metapneumovirus NOT DETECTED NOT DETECTED Final   Rhinovirus / Enterovirus NOT DETECTED NOT DETECTED Final   Influenza A NOT DETECTED NOT DETECTED Final   Influenza B NOT DETECTED NOT DETECTED Final   Parainfluenza Virus 1 NOT DETECTED NOT DETECTED Final   Parainfluenza Virus 2 NOT DETECTED NOT DETECTED Final   Parainfluenza Virus 3 NOT DETECTED NOT DETECTED Final   Parainfluenza Virus 4 NOT DETECTED NOT DETECTED Final   Respiratory Syncytial Virus NOT DETECTED NOT DETECTED Final   Bordetella pertussis NOT DETECTED NOT DETECTED Final   Chlamydophila pneumoniae NOT DETECTED NOT DETECTED Final   Mycoplasma pneumoniae NOT DETECTED NOT  DETECTED Final    Comment: Performed at [facility], [place] [address] [place], [place] [address]  Culture, Urine     Status: Abnormal   Collection Time: 01/12/17 10:13 AM  Result Value Ref Range Status   Specimen Description URINE, RANDOM  Final   Special Requests NONE  Final   Culture MULTIPLE SPECIES PRESENT, SUGGEST RECOLLECTION (A)  Final   Report Status 01/13/2017 FINAL  Final        Code Status Orders        Start     Ordered   01/11/17 2248  Full code  Continuous     01/11/17 2248    Code Status History    Date Active Date Inactive Code Status Order ID Comments User Context   01/12/2016 10:12 AM 01/14/2016 12:46 AM Full Code [id number]  Saundra Shelling, MD Inpatient          Follow-up Information    [name], MD Follow up in 1 week(s).   Specialty:  Internal Medicine Contact information: [place] [address] [phone]        Minna Merritts, MD Follow up in 2 week(s).   Specialty:  Cardiology Why:  f/u chf Contact information: [place]   [address] [phone]           Discharge Medications   Allergies as of 01/18/2017      Reactions   Simvastatin    Liver enzyme elevation      Medication List    TAKE these medications   acetaminophen 500 MG tablet Commonly known as:  TYLENOL Take 500 mg by mouth every 6 (six) hours as needed.   albuterol 108 (90 Base) MCG/ACT inhaler Commonly known as:  PROVENTIL HFA;VENTOLIN HFA Inhale 1-2 puffs into the lungs every 4 (four) hours as needed for wheezing or shortness of breath.   alendronate 70 MG tablet Commonly known as:  FOSAMAX Take 1 tablet (70 mg total) by mouth every 7 (seven) days. Take with a full glass of water on an empty stomach.   ALPRAZolam 0.25 MG tablet Commonly known as:  XANAX Take 1 tablet (0.25 mg total) by mouth 2 (two) times daily as needed for anxiety.   benzocaine 10 % mucosal gel Commonly known as:  ORAJEL Use as directed in the mouth or throat 3 (three) times daily as needed for mouth pain.   bisoprolol 5 MG tablet Commonly known as:  ZEBETA Take 0.5 tablets (2.5 mg total) by mouth daily.   budesonide 0.5 MG/2ML nebulizer solution Commonly known as:  PULMICORT Take 2 mLs (0.5 mg total) by nebulization 2 (two) times daily.   calcium carbonate 600 MG Tabs tablet Commonly known as:  OS-CAL Take 600 mg by mouth daily with breakfast.   cetirizine 10 MG tablet Commonly known as:  ZYRTEC Take 10 mg by mouth daily.   diphenhydrAMINE 25 MG tablet Commonly known as:  BENADRYL Take 25 mg by mouth at bedtime as needed.   escitalopram 10 MG tablet Commonly known as:  LEXAPRO TAKE 1 TABLET(10 MG) BY MOUTH DAILY   furosemide 20 MG tablet Commonly known as:  LASIX Take 1 tablet (20 mg total) by mouth 2 (two) times daily.   guaiFENesin 100 MG/5ML Soln Commonly known as:  ROBITUSSIN Take 10 mLs (200 mg total) by mouth every 4 (four) hours as needed for cough or to loosen phlegm.   ipratropium-albuterol 0.5-2.5 (3) MG/3ML Soln Commonly  known as:  DUONEB Take 3 mLs by nebulization every 8 (eight) hours.   levofloxacin 750 MG tablet Commonly known as:  LEVAQUIN Take 1 tablet (750 mg total) by mouth daily.   lidocaine 5 % Commonly known as:  LIDODERM Place 1 patch onto the skin daily. Remove & Discard patch within 12 hours or as directed by MD   losartan 50 MG tablet Commonly known as:  COZAAR Take 2 tablets (100 mg total) by mouth daily. What changed:  how much to take   MULTIPLE VITAMIN PO Take 1 tablet by mouth daily.   pantoprazole 40 MG tablet Commonly known as:  PROTONIX TAKE 1 TABLET(40 MG) BY MOUTH DAILY   predniSONE 10 MG tablet Commonly known as:  DELTASONE Take 40 mg by mouth on day 1, then taper 10 mg daily until gone   valACYclovir 1000 MG tablet Commonly known as:  VALTREX Take 1,000 mg by mouth 2 (two) times daily.          Total Time in preparing paper work, data evaluation and todays exam - 35 minutes  Dustin Flock M.D on 01/18/2017 at 12:41 PM  [facility]   Office  [phone]

## 2017-01-18 NOTE — Care Management Note (Signed)
Case Management Note  Patient Details  Name: Mary Howe MRN: [id number] Date of Birth: 09/23/1932  Subjective/Objective:   Admitted with AECOPD and acute CHF. Met with patient and briefly discussed home health which she is agreeable. Lives alone, uses a cane and has family close that checks on her frequently. Daughter is main contact. She ask that I call her daughter [name] to further discuss home health. TC to [name] and offered choice of home health agencies. She states patient is on 2-3 l chronic O2 through [facility] so she prefers them. Referred patient to cardiopulmonary program through [facility]. Paperwork for program signed by Dr. Patel. PCP is DR. Tullo. She will be discharge home by car.                   Action/Plan: [facility] for cardiopulmonary program (SN and PT)  Expected Discharge Date:                  Expected Discharge Plan:  Home w Home Health Services  In-House Referral:     Discharge planning Services  CM Consult  Post Acute Care Choice:  Home Health Choice offered to:  Patient, Adult Children  DME Arranged:    DME Agency:     HH Arranged:  RN, PT HH Agency:  [facility]  Status of Service:  Completed, signed off  If discussed at Long Length of Stay Meetings, dates discussed:    Additional Comments:  Lisa M Jacobs, RN 01/18/2017, 11:37 AM  

## 2017-01-18 NOTE — Telephone Encounter (Signed)
PT being discharged today  Needs 7 to 10 day for TCM Please call

## 2017-01-18 NOTE — Progress Notes (Signed)
Patient discharge home as per order, discharge instruction provided, iv removed, patient discharged home with home health

## 2017-01-19 ENCOUNTER — Telehealth: Payer: Self-pay | Admitting: *Deleted

## 2017-01-19 NOTE — Telephone Encounter (Signed)
Transition Care Management Follow-up Telephone Call  How have you been since you were released from the hospital? Patient daughter stated she is doing well , with normal routine.   Do you understand why you were in the hospital? Yes   Do you understand the discharge instrcutions? Yes  Items Reviewed:  Medications reviewed: Yes  Allergies reviewed: Yes  Dietary changes reviewed: Yes  Referrals reviewed: Yes   Functional Questionnaire:  Activities of Daily Living (ADLs):   She states they are independent in the following: Independent in ADLs States they require assistance with the following: Home health is to start this week.   Any transportation issues/concerns?: No   Any patient concerns? No. Not at this time.  Confirmed importance and date/time of follow-up visits scheduled: Yes   Confirmed with patient if condition begins to worsen call PCP or go to the ER.  Patient was given the Call-a-Nurse line [phone]: Yes

## 2017-01-19 NOTE — Telephone Encounter (Signed)
Please advise on appointment date and time for this patient.

## 2017-01-19 NOTE — Telephone Encounter (Signed)
Pt. Discharged from [facility] 01/17/17  Pt will need a HFU  Please contact daughter Raphael Gibney [phone]

## 2017-01-20 DIAGNOSIS — Z9981 Dependence on supplemental oxygen: Secondary | ICD-10-CM | POA: Diagnosis not present

## 2017-01-20 DIAGNOSIS — E785 Hyperlipidemia, unspecified: Secondary | ICD-10-CM | POA: Diagnosis not present

## 2017-01-20 DIAGNOSIS — J189 Pneumonia, unspecified organism: Secondary | ICD-10-CM | POA: Diagnosis not present

## 2017-01-20 DIAGNOSIS — K219 Gastro-esophageal reflux disease without esophagitis: Secondary | ICD-10-CM | POA: Diagnosis not present

## 2017-01-20 DIAGNOSIS — Z87891 Personal history of nicotine dependence: Secondary | ICD-10-CM | POA: Diagnosis not present

## 2017-01-20 DIAGNOSIS — E119 Type 2 diabetes mellitus without complications: Secondary | ICD-10-CM | POA: Diagnosis not present

## 2017-01-20 DIAGNOSIS — I5021 Acute systolic (congestive) heart failure: Secondary | ICD-10-CM | POA: Diagnosis not present

## 2017-01-20 DIAGNOSIS — J441 Chronic obstructive pulmonary disease with (acute) exacerbation: Secondary | ICD-10-CM | POA: Diagnosis not present

## 2017-01-20 DIAGNOSIS — I110 Hypertensive heart disease with heart failure: Secondary | ICD-10-CM | POA: Diagnosis not present

## 2017-01-20 DIAGNOSIS — J440 Chronic obstructive pulmonary disease with acute lower respiratory infection: Secondary | ICD-10-CM | POA: Diagnosis not present

## 2017-01-20 DIAGNOSIS — J9621 Acute and chronic respiratory failure with hypoxia: Secondary | ICD-10-CM | POA: Diagnosis not present

## 2017-01-23 LAB — BLOOD GAS, VENOUS
Acid-base deficit: 10.5 mmol/L — ABNORMAL HIGH (ref 0.0–2.0)
Bicarbonate: 25.3 mmol/L (ref 20.0–28.0)
FIO2: 0.5
MODE: POSITIVE
Mechanical Rate: 12
O2 Saturation: 84.6 %
PEEP/CPAP: 6 cmH2O
PO2 VEN: 78 mmHg — AB (ref 32.0–45.0)
PRESSURE SUPPORT: 18 cmH2O
Patient temperature: 37
pCO2, Ven: UNDETERMINED mmHg (ref 44.0–60.0)
pH, Ven: 6.95 — CL (ref 7.250–7.430)

## 2017-01-24 DIAGNOSIS — E119 Type 2 diabetes mellitus without complications: Secondary | ICD-10-CM | POA: Diagnosis not present

## 2017-01-24 DIAGNOSIS — J189 Pneumonia, unspecified organism: Secondary | ICD-10-CM | POA: Diagnosis not present

## 2017-01-24 DIAGNOSIS — I110 Hypertensive heart disease with heart failure: Secondary | ICD-10-CM | POA: Diagnosis not present

## 2017-01-24 DIAGNOSIS — J440 Chronic obstructive pulmonary disease with acute lower respiratory infection: Secondary | ICD-10-CM | POA: Diagnosis not present

## 2017-01-24 DIAGNOSIS — J441 Chronic obstructive pulmonary disease with (acute) exacerbation: Secondary | ICD-10-CM | POA: Diagnosis not present

## 2017-01-24 DIAGNOSIS — I5021 Acute systolic (congestive) heart failure: Secondary | ICD-10-CM | POA: Diagnosis not present

## 2017-01-25 DIAGNOSIS — I5021 Acute systolic (congestive) heart failure: Secondary | ICD-10-CM | POA: Diagnosis not present

## 2017-01-25 DIAGNOSIS — J189 Pneumonia, unspecified organism: Secondary | ICD-10-CM | POA: Diagnosis not present

## 2017-01-25 DIAGNOSIS — J440 Chronic obstructive pulmonary disease with acute lower respiratory infection: Secondary | ICD-10-CM | POA: Diagnosis not present

## 2017-01-25 DIAGNOSIS — E119 Type 2 diabetes mellitus without complications: Secondary | ICD-10-CM | POA: Diagnosis not present

## 2017-01-25 DIAGNOSIS — I110 Hypertensive heart disease with heart failure: Secondary | ICD-10-CM | POA: Diagnosis not present

## 2017-01-25 DIAGNOSIS — J441 Chronic obstructive pulmonary disease with (acute) exacerbation: Secondary | ICD-10-CM | POA: Diagnosis not present

## 2017-01-26 DIAGNOSIS — J440 Chronic obstructive pulmonary disease with acute lower respiratory infection: Secondary | ICD-10-CM | POA: Diagnosis not present

## 2017-01-26 DIAGNOSIS — J189 Pneumonia, unspecified organism: Secondary | ICD-10-CM | POA: Diagnosis not present

## 2017-01-26 DIAGNOSIS — I5021 Acute systolic (congestive) heart failure: Secondary | ICD-10-CM | POA: Diagnosis not present

## 2017-01-26 DIAGNOSIS — E119 Type 2 diabetes mellitus without complications: Secondary | ICD-10-CM | POA: Diagnosis not present

## 2017-01-26 DIAGNOSIS — I110 Hypertensive heart disease with heart failure: Secondary | ICD-10-CM | POA: Diagnosis not present

## 2017-01-26 DIAGNOSIS — J441 Chronic obstructive pulmonary disease with (acute) exacerbation: Secondary | ICD-10-CM | POA: Diagnosis not present

## 2017-01-31 DIAGNOSIS — I110 Hypertensive heart disease with heart failure: Secondary | ICD-10-CM | POA: Diagnosis not present

## 2017-01-31 DIAGNOSIS — J440 Chronic obstructive pulmonary disease with acute lower respiratory infection: Secondary | ICD-10-CM | POA: Diagnosis not present

## 2017-01-31 DIAGNOSIS — J189 Pneumonia, unspecified organism: Secondary | ICD-10-CM | POA: Diagnosis not present

## 2017-01-31 DIAGNOSIS — E119 Type 2 diabetes mellitus without complications: Secondary | ICD-10-CM | POA: Diagnosis not present

## 2017-01-31 DIAGNOSIS — I5021 Acute systolic (congestive) heart failure: Secondary | ICD-10-CM | POA: Diagnosis not present

## 2017-01-31 DIAGNOSIS — J441 Chronic obstructive pulmonary disease with (acute) exacerbation: Secondary | ICD-10-CM | POA: Diagnosis not present

## 2017-02-01 ENCOUNTER — Ambulatory Visit (INDEPENDENT_AMBULATORY_CARE_PROVIDER_SITE_OTHER): Payer: Medicare Other | Admitting: Internal Medicine

## 2017-02-01 ENCOUNTER — Encounter: Payer: Self-pay | Admitting: Internal Medicine

## 2017-02-01 VITALS — BP 132/66 | HR 63 | Temp 98.3°F | Resp 16 | Ht 63.0 in | Wt 125.4 lb

## 2017-02-01 DIAGNOSIS — I5082 Biventricular heart failure: Secondary | ICD-10-CM

## 2017-02-01 DIAGNOSIS — I5021 Acute systolic (congestive) heart failure: Secondary | ICD-10-CM

## 2017-02-01 DIAGNOSIS — Z09 Encounter for follow-up examination after completed treatment for conditions other than malignant neoplasm: Secondary | ICD-10-CM

## 2017-02-01 DIAGNOSIS — J432 Centrilobular emphysema: Secondary | ICD-10-CM

## 2017-02-01 DIAGNOSIS — I10 Essential (primary) hypertension: Secondary | ICD-10-CM | POA: Diagnosis not present

## 2017-02-01 DIAGNOSIS — J9601 Acute respiratory failure with hypoxia: Secondary | ICD-10-CM | POA: Diagnosis not present

## 2017-02-01 LAB — BASIC METABOLIC PANEL
BUN: 14 mg/dL (ref 6–23)
CO2: 31 mEq/L (ref 19–32)
Calcium: 9.3 mg/dL (ref 8.4–10.5)
Chloride: 99 mEq/L (ref 96–112)
Creatinine, Ser: 0.61 mg/dL (ref 0.40–1.20)
GFR: 99.7 mL/min (ref >60.00)
Glucose, Bld: 107 mg/dL — ABNORMAL HIGH (ref 70–99)
POTASSIUM: 4.4 meq/L (ref 3.5–5.1)
SODIUM: 138 meq/L (ref 135–145)

## 2017-02-01 MED ORDER — BUDESONIDE 0.5 MG/2ML IN SUSP: 0.5000 mg | Freq: Two times a day (BID) | RESPIRATORY_TRACT | 3 refills | Status: DC

## 2017-02-01 MED ORDER — BISOPROLOL FUMARATE 5 MG PO TABS: 2.5000 mg | ORAL_TABLET | Freq: Every day | ORAL | 0 refills | Status: DC

## 2017-02-01 MED ORDER — ESCITALOPRAM OXALATE 10 MG PO TABS: ORAL_TABLET | ORAL | 5 refills | Status: DC

## 2017-02-01 NOTE — Progress Notes (Signed)
Subjective:  Patient ID: Mary Howe, female    DOB: Jan 06, 1933  Age: 83 y.o. MRN: [id number]  CC: The primary encounter diagnosis was Essential hypertension. Diagnoses of Biventricular congestive heart failure ([place]), Centrilobular emphysema ([place]), Acute systolic heart failure ([place]), Acute respiratory failure with hypoxia ([place]), and Hospital discharge follow-up were also pertinent to this visit.  HPI Mary Howe presents for hospital follow up.  Patient was admitted to [facility] on October 3rd with hypercarbic respiratory failure  With multiple contributors:  acute systolic heart failure with pulmonary edema,  and pneumonia in the setting of advanced COPD.  She was placed on BIPAP and did not require intubation.  She was diuresed  and treated for pneumonia.  Left and right heart cath was done to rule out ischemic cardiomyopathy In the setting of low EF of  25 to 35% with severe global hypokinesis and pulmonary hypertension by ECHO.  No significant blockages were found . Stress cardiomyopathy was diagnosed.  all labs notes and imaging studies were reviewed with patient and daughter Mary Howe. CTscan of chest Oct 9: was done to rule out PE.  diffuse centrilobular emphysema was noted.   Dsicharged home on Octo er 10  With supplemental oxygen 2 L/min . BISOPROLOL STARTED   HOME PT was ordered at discharge : Physical Therapy is  coming 2/week,  And an RN  2/week  Has 9 more weeks of visitations.   Home stressors identified: her 54 yr old sister .  Long discussion with patient and daughter about the history of their dysfunctional relationship     Outpatient Medications Prior to Visit  Medication Sig Dispense Refill  . acetaminophen (TYLENOL) 500 MG tablet Take 500 mg by mouth every 6 (six) hours as needed.    Marland Kitchen albuterol (PROVENTIL HFA;VENTOLIN HFA) 108 (90 Base) MCG/ACT inhaler Inhale 1-2 puffs into the lungs every 4 (four) hours as needed for wheezing or shortness of breath. 1 Inhaler 10  . alendronate  (FOSAMAX) 70 MG tablet Take 1 tablet (70 mg total) by mouth every 7 (seven) days. Take with a full glass of water on an empty stomach. 4 tablet 11  . benzocaine (ORAJEL) 10 % mucosal gel Use as directed in the mouth or throat 3 (three) times daily as needed for mouth pain. 5.3 g 0  . calcium carbonate (OS-CAL) 600 MG TABS tablet Take 600 mg by mouth daily with breakfast.     . cetirizine (ZYRTEC) 10 MG tablet Take 10 mg by mouth daily.    . diphenhydrAMINE (BENADRYL) 25 MG tablet Take 25 mg by mouth at bedtime as needed.    . furosemide (LASIX) 20 MG tablet Take 1 tablet (20 mg total) by mouth 2 (two) times daily. 60 tablet 11  . guaiFENesin (ROBITUSSIN) 100 MG/5ML SOLN Take 10 mLs (200 mg total) by mouth every 4 (four) hours as needed for cough or to loosen phlegm. 1200 mL 0  . lidocaine (LIDODERM) 5 % Place 1 patch onto the skin daily. Remove & Discard patch within 12 hours or as directed by MD 30 patch 0  . MULTIPLE VITAMIN PO Take 1 tablet by mouth daily.     . pantoprazole (PROTONIX) 40 MG tablet TAKE 1 TABLET(40 MG) BY MOUTH DAILY 90 tablet 0  . valACYclovir (VALTREX) 1000 MG tablet Take 1,000 mg by mouth 2 (two) times daily.     . bisoprolol (ZEBETA) 5 MG tablet Take 0.5 tablets (2.5 mg total) by mouth daily. 60 tablet 0  . budesonide (  PULMICORT) 0.5 MG/2ML nebulizer solution Take 2 mLs (0.5 mg total) by nebulization 2 (two) times daily. 120 mL 0  . escitalopram (LEXAPRO) 10 MG tablet TAKE 1 TABLET(10 MG) BY MOUTH DAILY 30 tablet 3  . ALPRAZolam (XANAX) 0.25 MG tablet Take 1 tablet (0.25 mg total) by mouth 2 (two) times daily as needed for anxiety. (Patient not taking: Reported on 02/01/2017) 20 tablet 0  . ipratropium-albuterol (DUONEB) 0.5-2.5 (3) MG/3ML SOLN Take 3 mLs by nebulization every 8 (eight) hours. (Patient not taking: Reported on 02/01/2017) 360 mL 1  . losartan (COZAAR) 50 MG tablet Take 2 tablets (100 mg total) by mouth daily. (Patient not taking: Reported on 02/01/2017) 60  tablet 2  . predniSONE (DELTASONE) 10 MG tablet Take 40 mg by mouth on day 1, then taper 10 mg daily until gone (Patient not taking: Reported on 02/01/2017) 10 tablet 0   No facility-administered medications prior to visit.     Review of Systems;  Patient denies headache, fevers, malaise, unintentional weight loss, skin rash, eye pain, sinus congestion and sinus pain, sore throat, dysphagia,  hemoptysis , cough, dyspnea, wheezing, chest pain, palpitations, orthopnea, edema, abdominal pain, nausea, melena, diarrhea, constipation, flank pain, dysuria, hematuria, urinary  Frequency, nocturia, numbness, tingling, seizures,  Focal weakness, Loss of consciousness,  Tremor, insomnia, depression, anxiety, and suicidal ideation.      Objective:  BP 132/66 (BP Location: Left Arm, Patient Position: Sitting, Cuff Size: Normal)   Pulse 63   Temp 98.3 F (36.8 C) (Oral)   Resp 16   Ht 5\' 3"  (1.6 m)   Wt 125 lb 6.4 oz (56.9 kg)   SpO2 96%   BMI 22.21 kg/m   BP Readings from Last 3 Encounters:  02/01/17 132/66  01/18/17 (!) 126/53  01/11/17 132/68    Wt Readings from Last 3 Encounters:  02/01/17 125 lb 6.4 oz (56.9 kg)  01/17/17 124 lb 9.6 oz (56.5 kg)  01/11/17 126 lb 9.6 oz (57.4 kg)    General appearance: alert, cooperative and appears stated age Ears: normal TM's and external ear canals both ears Throat: lips, mucosa, and tongue normal; teeth and gums normal Neck: no adenopathy, no carotid bruit, supple, symmetrical, trachea midline and thyroid not enlarged, symmetric, no tenderness/mass/nodules Back: symmetric, no curvature. ROM normal. No CVA tenderness. Lungs: clear to auscultation bilaterally Heart: regular rate and rhythm, S1, S2 normal, no murmur, click, rub or gallop Abdomen: soft, non-tender; bowel sounds normal; no masses,  no organomegaly Pulses: 2+ and symmetric Skin: Skin color, texture, turgor normal. No rashes or lesions Lymph nodes: Cervical, supraclavicular, and  axillary nodes normal.  Lab Results  Component Value Date   HGBA1C 6.1 11/10/2016   HGBA1C 6.4 07/01/2016   HGBA1C 5.9 (H) 01/12/2016    Lab Results  Component Value Date   CREATININE 0.61 02/01/2017   CREATININE 0.59 01/18/2017   CREATININE 0.51 01/17/2017    Lab Results  Component Value Date   WBC 11.1 (H) 01/16/2017   HGB 14.8 01/16/2017   HCT 43.0 01/16/2017   PLT 282 01/16/2017   GLUCOSE 107 (H) 02/01/2017   CHOL 188 12/14/2016   TRIG 79.0 12/14/2016   HDL 88.80 12/14/2016   LDLDIRECT 89.0 07/01/2016   LDLCALC 84 12/14/2016   ALT 53 01/16/2017   AST 25 01/16/2017   NA 138 02/01/2017   K 4.4 02/01/2017   CL 99 02/01/2017   CREATININE 0.61 02/01/2017   BUN 14 02/01/2017   CO2 31 02/01/2017  TSH 4.83 (H) 11/10/2016   INR 1.09 01/16/2017   HGBA1C 6.1 11/10/2016   MICROALBUR <0.7 01/28/2016    Dg Chest Port 1 View  Result Date: 01/13/2017 CLINICAL DATA:  Acute respiratory failure EXAM: PORTABLE CHEST 1 VIEW COMPARISON:  01/11/2017 FINDINGS: Cardiac shadow remains mildly enlarged. Tortuosity of thoracic aorta is noted. Diffuse interstitial changes are again identified and stable from the prior exam. No focal infiltrate is seen. No bony abnormality is noted. IMPRESSION: Diffuse interstitial changes stable from the previous exam. Electronically Signed   By: Inez Catalina M.D.   On: 01/13/2017 06:20    Assessment & Plan:   Problem List Items Addressed This Visit    Acute respiratory failure with hypoxia ([place])    Improved after diuresis and treatment of pneumonia in the setting of cardiomyopathy and COPD  Continue supplemental oxygen       Acute systolic heart failure ([place])    With pulmonary edema requiring diuresis with lasix during hospitalization . EF 25 to 35% by ECHO  Non ischemic cardiomyopathy by diagnostic right and left heart catheterization . Advised patient to continue to monitor wt daily and double the lasix dose for overnight weight gain of 2 lbs or  weekly weight gain of 5 lbs.       Relevant Medications   bisoprolol (ZEBETA) 5 MG tablet   COPD (chronic obstructive pulmonary disease) (H[facility])    Continue use of budesonide nebulized twice daily       Relevant Medications   budesonide (PULMICORT) 0.5 MG/2ML nebulizer solution   Other Relevant Orders   Ambulatory referral to Physical Therapy   Essential hypertension - Primary    Medication changes during hospitalization noted: losartan stopped and bisoprolol started for systolic dysfunction       Relevant Medications   bisoprolol (ZEBETA) 5 MG tablet   Other Relevant Orders   Basic metabolic panel (Completed)   Hospital discharge follow-up    Patient is stable post discharge and has no new issues or questions about discharge plans at the visit today for hospital follow up. All labs , imaging studies and progress notes from admission were reviewed with patient today         Other Visit Diagnoses    Biventricular congestive heart failure ([place])       Relevant Medications   bisoprolol (ZEBETA) 5 MG tablet   Other Relevant Orders   Ambulatory referral to Physical Therapy      I have discontinued Ms. Bermingham's losartan, ALPRAZolam, predniSONE, and ipratropium-albuterol. I am also having her maintain her MULTIPLE VITAMIN PO, calcium carbonate, valACYclovir, albuterol, diphenhydrAMINE, acetaminophen, benzocaine, lidocaine, alendronate, cetirizine, pantoprazole, guaiFENesin, furosemide, escitalopram, budesonide, and bisoprolol.  Meds ordered this encounter  Medications  . escitalopram (LEXAPRO) 10 MG tablet    Sig: TAKE 1 TABLET(10 MG) BY MOUTH DAILY    Dispense:  30 tablet    Refill:  5  . budesonide (PULMICORT) 0.5 MG/2ML nebulizer solution    Sig: Take 2 mLs (0.5 mg total) by nebulization 2 (two) times daily.    Dispense:  360 mL    Refill:  3  . bisoprolol (ZEBETA) 5 MG tablet    Sig: Take 0.5 tablets (2.5 mg total) by mouth daily.    Dispense:  30 tablet    Refill:  0     KEEP ON FILE FOR FUTURE REFILLS    Medications Discontinued During This Encounter  Medication Reason  . ipratropium-albuterol (DUONEB) 0.5-2.5 (3) MG/3ML SOLN Patient has not taken  in last 30 days  . predniSONE (DELTASONE) 10 MG tablet Completed Course  . escitalopram (LEXAPRO) 10 MG tablet Reorder  . budesonide (PULMICORT) 0.5 MG/2ML nebulizer solution Reorder  . losartan (COZAAR) 50 MG tablet   . ALPRAZolam (XANAX) 0.25 MG tablet   . bisoprolol (ZEBETA) 5 MG tablet Reorder    Follow-up: No Follow-up on file.   [name], MD

## 2017-02-01 NOTE — Patient Instructions (Addendum)
You do not need to resume losartan unless your blood pressure readings are persistently  above 130/80   It they go up that high ,  Start back on 50 mg daily   Continue the bisoprolol 1/2 tablet twice daily  (for your heart)   Continue the budesonide in your nebulizer twice daily   No shingles vaccine just yet

## 2017-02-02 NOTE — Assessment & Plan Note (Signed)
Medication changes during hospitalization noted: losartan stopped and bisoprolol started for systolic dysfunction

## 2017-02-02 NOTE — Assessment & Plan Note (Addendum)
With pulmonary edema requiring diuresis with lasix during hospitalization . EF 25 to 35% by ECHO  Non ischemic cardiomyopathy by diagnostic right and left heart catheterization . Advised patient to continue to monitor wt daily and double the lasix dose for overnight weight gain of 2 lbs or weekly weight gain of 5 lbs.

## 2017-02-02 NOTE — Assessment & Plan Note (Signed)
Improved after diuresis and treatment of pneumonia in the setting of cardiomyopathy and COPD  Continue supplemental oxygen

## 2017-02-02 NOTE — Assessment & Plan Note (Signed)
Patient is stable post discharge and has no new issues or questions about discharge plans at the visit today for hospital follow up. All labs , imaging studies and progress notes from admission were reviewed with patient today   

## 2017-02-02 NOTE — Assessment & Plan Note (Signed)
Continue use of budesonide nebulized twice daily

## 2017-02-03 ENCOUNTER — Other Ambulatory Visit: Payer: Self-pay | Admitting: *Deleted

## 2017-02-03 DIAGNOSIS — I5021 Acute systolic (congestive) heart failure: Secondary | ICD-10-CM | POA: Diagnosis not present

## 2017-02-03 DIAGNOSIS — E119 Type 2 diabetes mellitus without complications: Secondary | ICD-10-CM | POA: Diagnosis not present

## 2017-02-03 DIAGNOSIS — J441 Chronic obstructive pulmonary disease with (acute) exacerbation: Secondary | ICD-10-CM | POA: Diagnosis not present

## 2017-02-03 DIAGNOSIS — J189 Pneumonia, unspecified organism: Secondary | ICD-10-CM | POA: Diagnosis not present

## 2017-02-03 DIAGNOSIS — I110 Hypertensive heart disease with heart failure: Secondary | ICD-10-CM | POA: Diagnosis not present

## 2017-02-03 DIAGNOSIS — J440 Chronic obstructive pulmonary disease with acute lower respiratory infection: Secondary | ICD-10-CM | POA: Diagnosis not present

## 2017-02-03 DIAGNOSIS — J432 Centrilobular emphysema: Secondary | ICD-10-CM

## 2017-02-03 DIAGNOSIS — I5022 Chronic systolic (congestive) heart failure: Secondary | ICD-10-CM

## 2017-02-07 DIAGNOSIS — E119 Type 2 diabetes mellitus without complications: Secondary | ICD-10-CM | POA: Diagnosis not present

## 2017-02-07 DIAGNOSIS — J189 Pneumonia, unspecified organism: Secondary | ICD-10-CM | POA: Diagnosis not present

## 2017-02-07 DIAGNOSIS — J440 Chronic obstructive pulmonary disease with acute lower respiratory infection: Secondary | ICD-10-CM | POA: Diagnosis not present

## 2017-02-07 DIAGNOSIS — I5021 Acute systolic (congestive) heart failure: Secondary | ICD-10-CM | POA: Diagnosis not present

## 2017-02-07 DIAGNOSIS — J441 Chronic obstructive pulmonary disease with (acute) exacerbation: Secondary | ICD-10-CM | POA: Diagnosis not present

## 2017-02-07 DIAGNOSIS — I110 Hypertensive heart disease with heart failure: Secondary | ICD-10-CM | POA: Diagnosis not present

## 2017-02-09 DIAGNOSIS — I110 Hypertensive heart disease with heart failure: Secondary | ICD-10-CM | POA: Diagnosis not present

## 2017-02-09 DIAGNOSIS — J189 Pneumonia, unspecified organism: Secondary | ICD-10-CM | POA: Diagnosis not present

## 2017-02-09 DIAGNOSIS — E119 Type 2 diabetes mellitus without complications: Secondary | ICD-10-CM | POA: Diagnosis not present

## 2017-02-09 DIAGNOSIS — J440 Chronic obstructive pulmonary disease with acute lower respiratory infection: Secondary | ICD-10-CM | POA: Diagnosis not present

## 2017-02-09 DIAGNOSIS — J441 Chronic obstructive pulmonary disease with (acute) exacerbation: Secondary | ICD-10-CM | POA: Diagnosis not present

## 2017-02-09 DIAGNOSIS — I5021 Acute systolic (congestive) heart failure: Secondary | ICD-10-CM | POA: Diagnosis not present

## 2017-02-13 DIAGNOSIS — J189 Pneumonia, unspecified organism: Secondary | ICD-10-CM | POA: Diagnosis not present

## 2017-02-13 DIAGNOSIS — E119 Type 2 diabetes mellitus without complications: Secondary | ICD-10-CM | POA: Diagnosis not present

## 2017-02-13 DIAGNOSIS — I5021 Acute systolic (congestive) heart failure: Secondary | ICD-10-CM | POA: Diagnosis not present

## 2017-02-13 DIAGNOSIS — I110 Hypertensive heart disease with heart failure: Secondary | ICD-10-CM | POA: Diagnosis not present

## 2017-02-13 DIAGNOSIS — J441 Chronic obstructive pulmonary disease with (acute) exacerbation: Secondary | ICD-10-CM | POA: Diagnosis not present

## 2017-02-13 DIAGNOSIS — J440 Chronic obstructive pulmonary disease with acute lower respiratory infection: Secondary | ICD-10-CM | POA: Diagnosis not present

## 2017-02-15 DIAGNOSIS — E119 Type 2 diabetes mellitus without complications: Secondary | ICD-10-CM | POA: Diagnosis not present

## 2017-02-15 DIAGNOSIS — J440 Chronic obstructive pulmonary disease with acute lower respiratory infection: Secondary | ICD-10-CM | POA: Diagnosis not present

## 2017-02-15 DIAGNOSIS — J441 Chronic obstructive pulmonary disease with (acute) exacerbation: Secondary | ICD-10-CM | POA: Diagnosis not present

## 2017-02-15 DIAGNOSIS — I5021 Acute systolic (congestive) heart failure: Secondary | ICD-10-CM | POA: Diagnosis not present

## 2017-02-15 DIAGNOSIS — J189 Pneumonia, unspecified organism: Secondary | ICD-10-CM | POA: Diagnosis not present

## 2017-02-15 DIAGNOSIS — I110 Hypertensive heart disease with heart failure: Secondary | ICD-10-CM | POA: Diagnosis not present

## 2017-02-16 DIAGNOSIS — E119 Type 2 diabetes mellitus without complications: Secondary | ICD-10-CM | POA: Diagnosis not present

## 2017-02-16 DIAGNOSIS — I110 Hypertensive heart disease with heart failure: Secondary | ICD-10-CM | POA: Diagnosis not present

## 2017-02-16 DIAGNOSIS — J441 Chronic obstructive pulmonary disease with (acute) exacerbation: Secondary | ICD-10-CM | POA: Diagnosis not present

## 2017-02-16 DIAGNOSIS — J189 Pneumonia, unspecified organism: Secondary | ICD-10-CM | POA: Diagnosis not present

## 2017-02-16 DIAGNOSIS — J440 Chronic obstructive pulmonary disease with acute lower respiratory infection: Secondary | ICD-10-CM | POA: Diagnosis not present

## 2017-02-16 DIAGNOSIS — I5021 Acute systolic (congestive) heart failure: Secondary | ICD-10-CM | POA: Diagnosis not present

## 2017-02-21 ENCOUNTER — Ambulatory Visit: Payer: Self-pay | Admitting: Cardiovascular Disease

## 2017-02-22 ENCOUNTER — Ambulatory Visit (INDEPENDENT_AMBULATORY_CARE_PROVIDER_SITE_OTHER): Payer: Medicare Other | Admitting: Nurse Practitioner

## 2017-02-22 ENCOUNTER — Encounter: Payer: Self-pay | Admitting: Nurse Practitioner

## 2017-02-22 VITALS — BP 110/50 | HR 73 | Ht 63.0 in | Wt 125.5 lb

## 2017-02-22 DIAGNOSIS — I10 Essential (primary) hypertension: Secondary | ICD-10-CM

## 2017-02-22 DIAGNOSIS — I428 Other cardiomyopathies: Secondary | ICD-10-CM | POA: Diagnosis not present

## 2017-02-22 DIAGNOSIS — I5181 Takotsubo syndrome: Secondary | ICD-10-CM | POA: Diagnosis not present

## 2017-02-22 NOTE — Patient Instructions (Addendum)
Medication Instructions:  Your physician recommends that you continue on your current medications as directed. Please refer to the Current Medication list given to you today.   Labwork: none  Testing/Procedures: Your physician has requested that you have an echocardiogram. Echocardiography is a painless test that uses sound waves to create images of your heart. It provides your doctor with information about the size and shape of your heart and how well your heart's chambers and valves are working. This procedure takes approximately one hour. There are no restrictions for this procedure.    Follow-Up: Your physician recommends that you schedule a follow-up appointment in: 3 months with Dr. Rockey Situ.    Any Other Special Instructions Will Be Listed Below (If Applicable).     If you need a refill on your cardiac medications before your next appointment, please call your pharmacy.  Echocardiogram An echocardiogram, or echocardiography, uses sound waves (ultrasound) to produce an image of your heart. The echocardiogram is simple, painless, obtained within a short period of time, and offers valuable information to your health care provider. The images from an echocardiogram can provide information such as:  Evidence of coronary artery disease (CAD).  Heart size.  Heart muscle function.  Heart valve function.  Aneurysm detection.  Evidence of a past heart attack.  Fluid buildup around the heart.  Heart muscle thickening.  Assess heart valve function.  Tell a health care provider about:  Any allergies you have.  All medicines you are taking, including vitamins, herbs, eye drops, creams, and over-the-counter medicines.  Any problems you or family members have had with anesthetic medicines.  Any blood disorders you have.  Any surgeries you have had.  Any medical conditions you have.  Whether you are pregnant or may be pregnant. What happens before the procedure? No  special preparation is needed. Eat and drink normally. What happens during the procedure?  In order to produce an image of your heart, gel will be applied to your chest and a wand-like tool (transducer) will be moved over your chest. The gel will help transmit the sound waves from the transducer. The sound waves will harmlessly bounce off your heart to allow the heart images to be captured in real-time motion. These images will then be recorded.  You may need an IV to receive a medicine that improves the quality of the pictures. What happens after the procedure? You may return to your normal schedule including diet, activities, and medicines, unless your health care provider tells you otherwise. This information is not intended to replace advice given to you by your health care provider. Make sure you discuss any questions you have with your health care provider. Document Released: 03/25/2000 Document Revised: 11/14/2015 Document Reviewed: 12/03/2012 Elsevier Interactive Patient Education  2017 [employer].

## 2017-02-22 NOTE — Progress Notes (Signed)
Office Visit    Patient Name: Mary Howe Date of Encounter: 02/22/2017  Primary Care Provider:  [name], MD Primary Cardiologist:  Johnny Bridge, MD   Chief Complaint    83 y/o ? with a history of COPD, hypertension, hyperlipidemia, GERD, mild pulmonary hypertension, and recent diagnosis of Takotsubo cardiomyopathy who presents for follow-up.  Past Medical History    Past Medical History:  Diagnosis Date  . COPD (chronic obstructive pulmonary disease) ([place])   . Diverticulitis 2015  . Essential hypertension   . GERD (gastroesophageal reflux disease)   . Hyperlipidemia   . PAH (pulmonary artery hypertension) ([place])    a. 01/2017 Echo: PASP 53mmHg; b. 01/2017 RHC: mild PAH.  . Takotsubo cardiomyopathy    a. 01/2017 Echo: EF 25-30%, sev glob HK w/ basal regions moving well. Gr1 DD. mild MR, mildly dil LA, nl RV fxn, mild to mod TR, PASP 47mmHg; b. 01/2017 Cath: Nl cors w/ EF 35-45%. HK of distal segments suggestive of Takotsubo CM.   Marland Kitchen Thyroid disease    Past Surgical History:  Procedure Laterality Date  . CERVICAL CONE BIOPSY    . CHOLECYSTECTOMY    . HEMORROIDECTOMY      Allergies  Allergies  Allergen Reactions  . Simvastatin     Liver enzyme elevation    History of Present Illness    83 y/o ? with a history of COPD, hypertension, hyperlipidemia, and GERD.  In early October, she was admitted with acute on chronic hypoxic and hypercapnic respiratory failure in the setting of atypical pulmonary infection and AE COPD.  In that setting, she suffered acute encephalopathy and sepsis with hypotension requiring vasopressors.  Echocardiogram showed LV dysfunction with an EF of 25-30% with severe global hypokinesis.  Troponin was mildly elevated with peak of 0.54.  Once respiratory status improved some, she underwent diagnostic catheterization revealing normal coronary arteries, normal right heart filling pressures, and mild pulmonary hypertension.  EF on ventriculography  has improved some to 35-45%.  She was discharged on low-dose bisoprolol and losartan however losartan was subsequently discontinued in the setting of soft blood pressures documented by home health nursing.  Since her hospitalization, she has had significant improvement in respiratory status and strength.  She has been seen by home health nursing as well as physical therapy.  She continues to use oxygen at night but uses it only sparingly during the day.  She does check her oxygen saturation and generally, she runs over 90% on room air.  She denies chest pain, dyspnea, PND, orthopnea, dizziness, syncope, edema, or early satiety.  She had labs drawn on October 24 which showed stable renal function and electrolytes.  Home Medications    Prior to Admission medications   Medication Sig Start Date End Date Taking? Authorizing Provider  acetaminophen (TYLENOL) 500 MG tablet Take 500 mg by mouth every 6 (six) hours as needed.   Yes [provider]  albuterol (PROVENTIL HFA;VENTOLIN HFA) 108 (90 Base) MCG/ACT inhaler Inhale 1-2 puffs into the lungs every 4 (four) hours as needed for wheezing or shortness of breath. 04/20/16  Yes Wilhelmina Mcardle, MD  alendronate (FOSAMAX) 70 MG tablet Take 1 tablet (70 mg total) by mouth every 7 (seven) days. Take with a full glass of water on an empty stomach. 06/22/16  Yes [name], MD  benzocaine (ORAJEL) 10 % mucosal gel Use as directed in the mouth or throat 3 (three) times daily as needed for mouth pain. 06/12/16  Yes Epifanio Lesches, MD  bisoprolol (ZEBETA) 5 MG tablet Take 0.5 tablets (2.5 mg total) by mouth daily. 02/01/17  Yes [name], MD  budesonide (PULMICORT) 0.5 MG/2ML nebulizer solution Take 2 mLs (0.5 mg total) by nebulization 2 (two) times daily. 02/01/17 03/03/17 Yes [name], MD  calcium carbonate (OS-CAL) 600 MG TABS tablet Take 600 mg by mouth daily with breakfast.  02/26/13  Yes [provider]  cetirizine (ZYRTEC)  10 MG tablet Take 10 mg by mouth daily.   Yes [provider]  diphenhydrAMINE (BENADRYL) 25 MG tablet Take 25 mg by mouth at bedtime as needed.   Yes [provider]  escitalopram (LEXAPRO) 10 MG tablet TAKE 1 TABLET(10 MG) BY MOUTH DAILY 02/01/17  Yes [name], MD  furosemide (LASIX) 20 MG tablet Take 1 tablet (20 mg total) by mouth 2 (two) times daily. 01/18/17 01/18/18 Yes Dustin Flock, MD  guaiFENesin (ROBITUSSIN) 100 MG/5ML SOLN Take 10 mLs (200 mg total) by mouth every 4 (four) hours as needed for cough or to loosen phlegm. 01/18/17  Yes Dustin Flock, MD  lidocaine (LIDODERM) 5 % Place 1 patch onto the skin daily. Remove & Discard patch within 12 hours or as directed by MD 06/12/16  Yes Epifanio Lesches, MD  MULTIPLE VITAMIN PO Take 1 tablet by mouth daily.  02/26/13  Yes [provider]  OXYGEN Inhale 2.5 L daily into the lungs.   Yes [provider]  pantoprazole (PROTONIX) 40 MG tablet TAKE 1 TABLET(40 MG) BY MOUTH DAILY 12/16/16  Yes [name], MD  valACYclovir (VALTREX) 1000 MG tablet Take 1,000 mg by mouth 2 (two) times daily.  12/08/15  Yes [provider]    Review of Systems    Doing really well since discharge.  She denies chest pain, palpitations, dyspnea, PND, orthopnea, dizziness, syncope, edema, or early satiety.  All other systems reviewed and are otherwise negative except as noted above.  Physical Exam    VS:  BP (!) 110/50 (BP Location: Right Arm, Patient Position: Sitting, Cuff Size: Normal)   Pulse 73   Ht 5\' 3"  (1.6 m)   Wt 125 lb 8 oz (56.9 kg)   BMI 22.23 kg/m  , BMI Body mass index is 22.23 kg/m. GEN: Well nourished, well developed, in no acute distress.  HEENT: normal.  Neck: Supple, no JVD, carotid bruits, or masses. Cardiac: RRR, no murmurs, rubs, or gallops. No clubbing, cyanosis, edema.  Radials/DP/PT 2+ and equal bilaterally.  Respiratory:  Respirations regular and unlabored, clear to  auscultation bilaterally. GI: Soft, nontender, nondistended, BS + x 4. MS: no deformity or atrophy. Skin: warm and dry, no rash. Neuro:  Strength and sensation are intact. Psych: Normal affect.  Accessory Clinical Findings    ECG -regular sinus rhythm, 72, leftward axis, left atrial enlargement, nonspecific T changes.  Lab Results  Component Value Date   CREATININE 0.61 02/01/2017   BUN 14 02/01/2017   NA 138 02/01/2017   K 4.4 02/01/2017   CL 99 02/01/2017   CO2 31 02/01/2017    Assessment & Plan    1.  Nonischemic cardiomyopathy/Takotsubo cardiomyopathy/chronic combined systolic and diastolic congestive heart failure: Patient recently hospitalized with respiratory failure and sepsis and subsequent finding of troponin elevation with LV dysfunction.  Authorization revealed normal coronary arteries.  EF was 35-45% on ventriculography.  She has done exceptionally well since her discharge.  She is euvolemic on exam today and remains on beta-blocker and low-dose  Lasix.  ARB therapy was discontinued in the setting of soft blood pressures at home.  I will arrange for a follow-up echocardiogram to reevaluate LV function as she is coming up on 40 days since her event.  If she has persistent LV dysfunction, we will try and add back a very low dose of ARB therapy.  If LV function recovered, plan to continue beta-blocker.  She will be enrolling in cardiac and pulmonary rehab within the next couple weeks.  2.  Essential hypertension: Patient was previously on losartan 50 mg as an outpatient but pressures have been softer since being on low-dose bisoprolol following hospitalization.  Pressure stable today.  No changes.  3.  Disposition: Follow-up echocardiogram.  Follow-up in clinic in 3 months or sooner if necessary.   Murray Hodgkins, NP 02/22/2017, 11:06 AM

## 2017-02-28 DIAGNOSIS — J441 Chronic obstructive pulmonary disease with (acute) exacerbation: Secondary | ICD-10-CM | POA: Diagnosis not present

## 2017-02-28 DIAGNOSIS — I110 Hypertensive heart disease with heart failure: Secondary | ICD-10-CM | POA: Diagnosis not present

## 2017-02-28 DIAGNOSIS — J440 Chronic obstructive pulmonary disease with acute lower respiratory infection: Secondary | ICD-10-CM | POA: Diagnosis not present

## 2017-02-28 DIAGNOSIS — I5021 Acute systolic (congestive) heart failure: Secondary | ICD-10-CM | POA: Diagnosis not present

## 2017-02-28 DIAGNOSIS — E119 Type 2 diabetes mellitus without complications: Secondary | ICD-10-CM | POA: Diagnosis not present

## 2017-02-28 DIAGNOSIS — J189 Pneumonia, unspecified organism: Secondary | ICD-10-CM | POA: Diagnosis not present

## 2017-03-07 ENCOUNTER — Other Ambulatory Visit: Payer: Self-pay

## 2017-03-16 ENCOUNTER — Other Ambulatory Visit: Payer: Self-pay

## 2017-03-16 ENCOUNTER — Ambulatory Visit (INDEPENDENT_AMBULATORY_CARE_PROVIDER_SITE_OTHER): Payer: Medicare Other

## 2017-03-16 DIAGNOSIS — I5181 Takotsubo syndrome: Secondary | ICD-10-CM

## 2017-03-16 LAB — ECHOCARDIOGRAM LIMITED
CHL CUP MV DEC (S): 176
CHL CUP STROKE VOLUME: 44 mL
EERAT: 13.67
EWDT: 176 ms
FS: 38 % (ref 28–44)
IVS/LV PW RATIO, ED: 1
LV E/e' medial: 13.67
LV TDI E'LATERAL: 6.89
LV TDI E'MEDIAL: 6.96
LV dias vol: 79 mL (ref 46–106)
LV e' LATERAL: 6.89 cm/s
LV sys vol index: 22 mL/m2
LV sys vol: 36 mL (ref 14–42)
LVDIAVOLIN: 50 mL/m2
LVEEAVG: 13.67
Lateral S' vel: 13.4 cm/s
MV pk A vel: 107 m/s
MVAP: 4.23 cm2
MVPG: 4 mmHg
MVPKEVEL: 94.2 m/s
P 1/2 time: 52 ms
PW: 9 mm — AB (ref 0.6–1.1)
Simpson's disk: 55

## 2017-03-17 DIAGNOSIS — J440 Chronic obstructive pulmonary disease with acute lower respiratory infection: Secondary | ICD-10-CM | POA: Diagnosis not present

## 2017-03-17 DIAGNOSIS — J189 Pneumonia, unspecified organism: Secondary | ICD-10-CM | POA: Diagnosis not present

## 2017-03-17 DIAGNOSIS — E119 Type 2 diabetes mellitus without complications: Secondary | ICD-10-CM | POA: Diagnosis not present

## 2017-03-17 DIAGNOSIS — J441 Chronic obstructive pulmonary disease with (acute) exacerbation: Secondary | ICD-10-CM | POA: Diagnosis not present

## 2017-03-17 DIAGNOSIS — I5021 Acute systolic (congestive) heart failure: Secondary | ICD-10-CM | POA: Diagnosis not present

## 2017-03-17 DIAGNOSIS — I110 Hypertensive heart disease with heart failure: Secondary | ICD-10-CM | POA: Diagnosis not present

## 2017-03-21 ENCOUNTER — Ambulatory Visit: Payer: Self-pay

## 2017-04-05 ENCOUNTER — Other Ambulatory Visit: Payer: Self-pay | Admitting: Internal Medicine

## 2017-04-05 DIAGNOSIS — K219 Gastro-esophageal reflux disease without esophagitis: Secondary | ICD-10-CM

## 2017-04-07 ENCOUNTER — Telehealth: Payer: Self-pay | Admitting: Internal Medicine

## 2017-04-07 MED ORDER — PREDNISONE 10 MG PO TABS: ORAL_TABLET | ORAL | 0 refills | Status: DC

## 2017-04-07 NOTE — Telephone Encounter (Signed)
Patient daughter notified and voiced understanding will pick up prednisone taper from [facility].

## 2017-04-07 NOTE — Telephone Encounter (Signed)
Patient has sore throat , cough up yellow mucus thin and draining down back of throat, patient is afebrile, 02 sat @ 90% before neb treatment after neb patient was 97% on room air.    feels like before when she had bronchitis per patient daughter, patient has prednisone 20 mg written bid for seven days. on hand from illness she was hospitalized for in October, also has doxycycline on hand from same illness 100 mg written BID for seven days , patient daughter is asking should she be seen or can she use her on hand medications. Patient also has nebulizer Pulmicort and albuterol inhaler, and patient has 02 on hand.

## 2017-04-07 NOTE — Telephone Encounter (Signed)
Ok to start doxycycline,  I prefer the prednisone taper to the one that she has so I sent it to [facility].

## 2017-04-13 ENCOUNTER — Telehealth: Payer: Self-pay | Admitting: Internal Medicine

## 2017-04-13 MED ORDER — ACYCLOVIR 400 MG PO TABS: 400.0000 mg | ORAL_TABLET | Freq: Every day | ORAL | 0 refills | Status: DC

## 2017-04-13 NOTE — Telephone Encounter (Signed)
Mary Howe is having a recurrence of shingles per call  . I sent in acyclovir to her local pharmacy to start immediately.

## 2017-04-13 NOTE — Telephone Encounter (Signed)
Patient notified and voiced understanding.

## 2017-04-18 ENCOUNTER — Other Ambulatory Visit: Payer: Self-pay

## 2017-04-18 ENCOUNTER — Encounter: Payer: Medicare Other | Attending: Internal Medicine

## 2017-04-18 VITALS — Ht 64.0 in | Wt 127.6 lb

## 2017-04-18 DIAGNOSIS — I5022 Chronic systolic (congestive) heart failure: Secondary | ICD-10-CM | POA: Diagnosis not present

## 2017-04-18 DIAGNOSIS — J432 Centrilobular emphysema: Secondary | ICD-10-CM | POA: Insufficient documentation

## 2017-04-18 NOTE — Progress Notes (Signed)
Pulmonary Individual Treatment Plan  Patient Details  Name: Mary Howe MRN: [id number] Date of Birth: [birth year] Referring Provider:     Pulmonary Rehab from 04/18/2017 in [facility] Cardiac and Pulmonary Rehab  Referring Provider  Derrel Nip      Initial Encounter Date:    Pulmonary Rehab from 04/18/2017 in [facility] Cardiac and Pulmonary Rehab  Date  04/18/17  Referring Provider  Derrel Nip      Visit Diagnosis: Centrilobular emphysema ([place])  Patient's Home Medications on Admission:  Current Outpatient Medications:  .  acetaminophen (TYLENOL) 500 MG tablet, Take 500 mg by mouth every 6 (six) hours as needed., Disp: , Rfl:  .  acyclovir (ZOVIRAX) 400 MG tablet, Take 1 tablet (400 mg total) by mouth 5 (five) times daily., Disp: 35 tablet, Rfl: 0 .  albuterol (PROVENTIL HFA;VENTOLIN HFA) 108 (90 Base) MCG/ACT inhaler, Inhale 1-2 puffs into the lungs every 4 (four) hours as needed for wheezing or shortness of breath., Disp: 1 Inhaler, Rfl: 10 .  alendronate (FOSAMAX) 70 MG tablet, Take 1 tablet (70 mg total) by mouth every 7 (seven) days. Take with a full glass of water on an empty stomach., Disp: 4 tablet, Rfl: 11 .  benzocaine (ORAJEL) 10 % mucosal gel, Use as directed in the mouth or throat 3 (three) times daily as needed for mouth pain., Disp: 5.3 g, Rfl: 0 .  bisoprolol (ZEBETA) 5 MG tablet, Take 0.5 tablets (2.5 mg total) by mouth daily., Disp: 30 tablet, Rfl: 0 .  budesonide (PULMICORT) 0.5 MG/2ML nebulizer solution, Take 2 mLs (0.5 mg total) by nebulization 2 (two) times daily., Disp: 360 mL, Rfl: 3 .  calcium carbonate (OS-CAL) 600 MG TABS tablet, Take 600 mg by mouth daily with breakfast. , Disp: , Rfl:  .  cetirizine (ZYRTEC) 10 MG tablet, Take 10 mg by mouth daily., Disp: , Rfl:  .  diphenhydrAMINE (BENADRYL) 25 MG tablet, Take 25 mg by mouth at bedtime as needed., Disp: , Rfl:  .  escitalopram (LEXAPRO) 10 MG tablet, TAKE 1 TABLET(10 MG) BY MOUTH DAILY, Disp: 30 tablet, Rfl: 5 .  furosemide  (LASIX) 20 MG tablet, Take 1 tablet (20 mg total) by mouth 2 (two) times daily., Disp: 60 tablet, Rfl: 11 .  guaiFENesin (ROBITUSSIN) 100 MG/5ML SOLN, Take 10 mLs (200 mg total) by mouth every 4 (four) hours as needed for cough or to loosen phlegm., Disp: 1200 mL, Rfl: 0 .  lidocaine (LIDODERM) 5 %, Place 1 patch onto the skin daily. Remove & Discard patch within 12 hours or as directed by MD, Disp: 30 patch, Rfl: 0 .  MULTIPLE VITAMIN PO, Take 1 tablet by mouth daily. , Disp: , Rfl:  .  OXYGEN, Inhale 2.5 L daily into the lungs., Disp: , Rfl:  .  pantoprazole (PROTONIX) 40 MG tablet, TAKE 1 TABLET(40 MG) BY MOUTH DAILY, Disp: 90 tablet, Rfl: 0 .  predniSONE (DELTASONE) 10 MG tablet, 6 tablets on Day 1 , then reduce by 1 tablet daily until gone, Disp: 21 tablet, Rfl: 0 .  valACYclovir (VALTREX) 1000 MG tablet, Take 1,000 mg by mouth 2 (two) times daily. , Disp: , Rfl:   Past Medical History: Past Medical History:  Diagnosis Date  . COPD (chronic obstructive pulmonary disease) ([place])   . Diverticulitis 2015  . Essential hypertension   . GERD (gastroesophageal reflux disease)   . Hyperlipidemia   . PAH (pulmonary artery hypertension) ([place])    a. 01/2017 Echo: PASP 36mmHg; b. 01/2017 RHC: mild PAH.  Marland Kitchen  Takotsubo cardiomyopathy    a. 01/2017 Echo: EF 25-30%, sev glob HK w/ basal regions moving well. Gr1 DD. mild MR, mildly dil LA, nl RV fxn, mild to mod TR, PASP 72mmHg; b. 01/2017 Cath: Nl cors w/ EF 35-45%. HK of distal segments suggestive of Takotsubo CM.   Marland Kitchen Thyroid disease     Tobacco Use: Social History   Tobacco Use  Smoking Status Former Smoker  . Packs/day: 1.50  . Years: 29.00  . Pack years: 43.50  . Types: Cigarettes  . Last attempt to quit: 04/11/1988  . Years since quitting: 29.0  Smokeless Tobacco Never Used  Tobacco Comment   quit smoking in 1990    Labs: Recent Review Flowsheet Data    Labs for ITP Cardiac and Pulmonary Rehab Latest Ref Rng & Units 07/01/2016 11/10/2016  12/14/2016 01/11/2017 01/11/2017   Cholestrol 0 - 200 mg/dL 186 151 188 - -   LDLCALC 0 - 99 mg/dL 84 46 84 - -   LDLDIRECT mg/dL 89.0 - - - -   HDL >39.00 mg/dL 81.90 93.80 88.80 - -   Trlycerides 0.0 - 149.0 mg/dL 99.0 56.0 79.0 - -   Hemoglobin A1c 4.6 - 6.5 % 6.4 6.1 - - -   PHART 7.350 - 7.450 - - - - 7.21(L)   PCO2ART 32.0 - 48.0 mmHg - - - - 48   HCO3 20.0 - 28.0 mmol/L - - - 25.3 19.2(L)   ACIDBASEDEF 0.0 - 2.0 mmol/L - - - 10.5(H) 8.8(H)   O2SAT % - - - 84.6 99.0       Pulmonary Assessment Scores: Pulmonary Assessment Scores    Row Name 04/18/17 1122         ADL UCSD   ADL Phase  Entry     SOB Score total  13     Rest  1     Walk  0     Stairs  1     Bath  0     Dress  0     Shop  0       CAT Score   CAT Score  14       mMRC Score   mMRC Score  1        Pulmonary Function Assessment: Pulmonary Function Assessment - 04/18/17 1124      Breath   Bilateral Breath Sounds  Clear    Shortness of Breath  No       Exercise Target Goals: Date: 04/18/17  Exercise Program Goal: Individual exercise prescription set with THRR, safety & activity barriers. Participant demonstrates ability to understand and report RPE using BORG scale, to self-measure pulse accurately, and to acknowledge the importance of the exercise prescription.  Exercise Prescription Goal: Starting with aerobic activity 30 plus minutes a day, 3 days per week for initial exercise prescription. Provide home exercise prescription and guidelines that participant acknowledges understanding prior to discharge.  Activity Barriers & Risk Stratification:   6 Minute Walk: 6 Minute Walk    Row Name 04/18/17 1227         6 Minute Walk   Distance  1040 feet     Walk Time  6 minutes     # of Rest Breaks  0     MPH  1.97     METS  1.98     RPE  10     Perceived Dyspnea   1     VO2 Peak  6.94  Symptoms  No     Resting HR  78 bpm     Resting BP  134/64     Resting Oxygen Saturation   95 %      Exercise Oxygen Saturation  during 6 min walk  89 %     Max Ex. HR  96 bpm     Max Ex. BP  130/60     2 Minute Post BP  120/60       Interval HR   1 Minute HR  82     2 Minute HR  79     4 Minute HR  86     5 Minute HR  92     6 Minute HR  96     2 Minute Post HR  74     Interval Heart Rate?  Yes       Interval Oxygen   Interval Oxygen?  Yes     Baseline Oxygen Saturation %  95 %     1 Minute Oxygen Saturation %  94 %     1 Minute Liters of Oxygen  0 L     2 Minute Oxygen Saturation %  89 %     2 Minute Liters of Oxygen  0 L     3 Minute Liters of Oxygen  0 L     4 Minute Oxygen Saturation %  94 %     4 Minute Liters of Oxygen  0 L     5 Minute Oxygen Saturation %  93 %     5 Minute Liters of Oxygen  0 L     6 Minute Oxygen Saturation %  91 %     6 Minute Liters of Oxygen  0 L     2 Minute Post Oxygen Saturation %  95 %     2 Minute Post Liters of Oxygen  0 L       Oxygen Initial Assessment: Oxygen Initial Assessment - 04/18/17 1126      Home Oxygen   Home Oxygen Device  Home Concentrator;E-Tanks    Sleep Oxygen Prescription  Continuous    Liters per minute  2.5    Home Exercise Oxygen Prescription  Continuous she uses only when things are strenuous    Liters per minute  2.5    Home at Rest Exercise Oxygen Prescription  Continuous    Liters per minute  2.5    Compliance with Home Oxygen Use  Yes She does not use her oxygen all the time at home. Her oxygen saturations have been better. When she uses exertion she uses oxygen      Initial 6 min Walk   Oxygen Used  None      Program Oxygen Prescription   Program Oxygen Prescription  None      Intervention   Short Term Goals  To learn and understand importance of maintaining oxygen saturations>88%;To learn and demonstrate proper pursed lip breathing techniques or other breathing techniques.;To learn and understand importance of monitoring SPO2 with pulse oximeter and demonstrate accurate use of the pulse oximeter.;To  learn and exhibit compliance with exercise, home and travel O2 prescription;To learn and demonstrate proper use of respiratory medications    Long  Term Goals  Exhibits compliance with exercise, home and travel O2 prescription;Verbalizes importance of monitoring SPO2 with pulse oximeter and return demonstration;Maintenance of O2 saturations>88%;Exhibits proper breathing techniques, such as pursed lip breathing or other method taught during program session;Compliance with respiratory  medication;Demonstrates proper use of MDI's       Oxygen Re-Evaluation:   Oxygen Discharge (Final Oxygen Re-Evaluation):   Initial Exercise Prescription: Initial Exercise Prescription - 04/18/17 1200      Date of Initial Exercise RX and Referring Provider   Date  04/18/17    Referring Provider  Tullo      Treadmill   MPH  1.8    Grade  0    Minutes  15    METs  2      NuStep   Level  2    SPM  80    Minutes  15    METs  2      REL-XR   Level  2    Speed  50    Minutes  15    METs  2      Prescription Details   Frequency (times per week)  3    Duration  Progress to 45 minutes of aerobic exercise without signs/symptoms of physical distress      Intensity   THRR 40-80% of Max Heartrate  102-126    Ratings of Perceived Exertion  11-13    Perceived Dyspnea  0-4      Resistance Training   Training Prescription  Yes    Weight  2 lb    Reps  10-15       Perform Capillary Blood Glucose checks as needed.  Exercise Prescription Changes:   Exercise Comments:   Exercise Goals and Review: Exercise Goals    Row Name 04/18/17 1227             Exercise Goals   Increase Physical Activity  Yes       Intervention  Provide advice, education, support and counseling about physical activity/exercise needs.;Develop an individualized exercise prescription for aerobic and resistive training based on initial evaluation findings, risk stratification, comorbidities and participant's personal  goals.       Expected Outcomes  Achievement of increased cardiorespiratory fitness and enhanced flexibility, muscular endurance and strength shown through measurements of functional capacity and personal statement of participant.       Increase Strength and Stamina  Yes       Intervention  Provide advice, education, support and counseling about physical activity/exercise needs.;Develop an individualized exercise prescription for aerobic and resistive training based on initial evaluation findings, risk stratification, comorbidities and participant's personal goals.       Expected Outcomes  Achievement of increased cardiorespiratory fitness and enhanced flexibility, muscular endurance and strength shown through measurements of functional capacity and personal statement of participant.       Able to understand and use rate of perceived exertion (RPE) scale  Yes       Intervention  Provide education and explanation on how to use RPE scale       Expected Outcomes  Short Term: Able to use RPE daily in rehab to express subjective intensity level;Long Term:  Able to use RPE to guide intensity level when exercising independently       Able to understand and use Dyspnea scale  Yes       Intervention  Provide education and explanation on how to use Dyspnea scale       Expected Outcomes  Short Term: Able to use Dyspnea scale daily in rehab to express subjective sense of shortness of breath during exertion;Long Term: Able to use Dyspnea scale to guide intensity level when exercising independently       Knowledge and  understanding of Target Heart Rate Range (THRR)  Yes       Intervention  Provide education and explanation of THRR including how the numbers were predicted and where they are located for reference       Expected Outcomes  Short Term: Able to state/look up THRR;Long Term: Able to use THRR to govern intensity when exercising independently;Short Term: Able to use daily as guideline for intensity in rehab        Able to check pulse independently  Yes       Intervention  Provide education and demonstration on how to check pulse in carotid and radial arteries.;Review the importance of being able to check your own pulse for safety during independent exercise       Expected Outcomes  Short Term: Able to explain why pulse checking is important during independent exercise;Long Term: Able to check pulse independently and accurately       Understanding of Exercise Prescription  Yes       Intervention  Provide education, explanation, and written materials on patient's individual exercise prescription       Expected Outcomes  Short Term: Able to explain program exercise prescription;Long Term: Able to explain home exercise prescription to exercise independently          Exercise Goals Re-Evaluation :   Discharge Exercise Prescription (Final Exercise Prescription Changes):   Nutrition:  Target Goals: Understanding of nutrition guidelines, daily intake of sodium 1500mg , cholesterol 200mg , calories 30% from fat and 7% or less from saturated fats, daily to have 5 or more servings of fruits and vegetables.  Biometrics: Pre Biometrics - 04/18/17 1226      Pre Biometrics   Height  5\' 4"  (1.626 m)    Weight  127 lb 9.6 oz (57.9 kg)    Waist Circumference  29.75 inches    Hip Circumference  39 inches    Waist to Hip Ratio  0.76 %    BMI (Calculated)  21.89        Nutrition Therapy Plan and Nutrition Goals: Nutrition Therapy & Goals - 04/18/17 1121      Personal Nutrition Goals   Comments  She plans to meet with the dietician. She states she eats pretty well and watches what she eats.       Intervention Plan   Intervention  Nutrition handout(s) given to patient.;Prescribe, educate and counsel regarding individualized specific dietary modifications aiming towards targeted core components such as weight, hypertension, lipid management, diabetes, heart failure and other comorbidities.    Expected  Outcomes  Short Term Goal: Understand basic principles of dietary content, such as calories, fat, sodium, cholesterol and nutrients.;Long Term Goal: Adherence to prescribed nutrition plan.       Nutrition Discharge: Rate Your Plate Scores:   Nutrition Goals Re-Evaluation:   Nutrition Goals Discharge (Final Nutrition Goals Re-Evaluation):   Psychosocial: Target Goals: Acknowledge presence or absence of significant depression and/or stress, maximize coping skills, provide positive support system. Participant is able to verbalize types and ability to use techniques and skills needed for reducing stress and depression.   Initial Review & Psychosocial Screening: Initial Psych Review & Screening - 04/18/17 1116      Initial Review   Current issues with  Current Stress Concerns    Source of Stress Concerns  Unable to perform yard/household activities    Comments  Under some stress. Her sister that she lives with is 29 and sometimes she gets on her nerves. She does not get  short of breath that often but sometimes she gets short of breath when she is grocery shopping. She moved from [place] in 2014.       Family Dynamics   Good Support System?  Yes    Comments  Her daughter helps her when she needs it.       Barriers   Psychosocial barriers to participate in program  The patient should benefit from training in stress management and relaxation.      Screening Interventions   Interventions  Yes;Encouraged to exercise;Program counselor consult;Provide feedback about the scores to participant;To provide support and resources with identified psychosocial needs    Expected Outcomes  Short Term goal: Utilizing psychosocial counselor, staff and physician to assist with identification of specific Stressors or current issues interfering with healing process. Setting desired goal for each stressor or current issue identified.;Long Term Goal: Stressors or current issues are controlled or  eliminated.;Short Term goal: Identification and review with participant of any Quality of Life or Depression concerns found by scoring the questionnaire.;Long Term goal: The participant improves quality of Life and PHQ9 Scores as seen by post scores and/or verbalization of changes       Quality of Life Scores:   PHQ-9: Recent Review Flowsheet Data    Depression screen [facility] 2/9 04/18/2017 09/20/2016 06/22/2016 02/23/2015   Decreased Interest 0 0 0 0   Down, Depressed, Hopeless 0 0 0 0   PHQ - 2 Score 0 0 0 0   Altered sleeping 0 - - -   Tired, decreased energy 0 - - -   Change in appetite 0 - - -   Feeling bad or failure about yourself  0 - - -   Trouble concentrating 0 - - -   Moving slowly or fidgety/restless 0 - - -   Suicidal thoughts 0 - - -   PHQ-9 Score 0 - - -   Difficult doing work/chores Not difficult at all - - -     Interpretation of Total Score  Total Score Depression Severity:  1-4 = Minimal depression, 5-9 = Mild depression, 10-14 = Moderate depression, 15-19 = Moderately severe depression, 20-27 = Severe depression   Psychosocial Evaluation and Intervention:   Psychosocial Re-Evaluation:   Psychosocial Discharge (Final Psychosocial Re-Evaluation):   Education: Education Goals: Education classes will be provided on a weekly basis, covering required topics. Participant will state understanding/return demonstration of topics presented.  Learning Barriers/Preferences: Learning Barriers/Preferences - 04/18/17 1126      Learning Barriers/Preferences   Learning Barriers  Sight wears glasses    Learning Preferences  None       Education Topics: Initial Evaluation Education: - Verbal, written and demonstration of respiratory meds, RPE/PD scales, oximetry and breathing techniques. Instruction on use of nebulizers and MDIs: cleaning and proper use, rinsing mouth with steroid doses and importance of monitoring MDI activations.   Pulmonary Rehab from 04/18/2017 in [facility]  Cardiac and Pulmonary Rehab  Date  04/18/17  Educator  [facility]  Instruction Review Code  1- Verbalizes Understanding      General Nutrition Guidelines/Fats and Fiber: -Group instruction provided by verbal, written material, models and posters to present the general guidelines for heart healthy nutrition. Gives an explanation and review of dietary fats and fiber.   Controlling Sodium/Reading Food Labels: -Group verbal and written material supporting the discussion of sodium use in heart healthy nutrition. Review and explanation with models, verbal and written materials for utilization of the food label.   Exercise Physiology &  Risk Factors: - Group verbal and written instruction with models to review the exercise physiology of the cardiovascular system and associated critical values. Details cardiovascular disease risk factors and the goals associated with each risk factor.   Aerobic Exercise & Resistance Training: - Gives group verbal and written discussion on the health impact of inactivity. On the components of aerobic and resistive training programs and the benefits of this training and how to safely progress through these programs.   Flexibility, Balance, General Exercise Guidelines: - Provides group verbal and written instruction on the benefits of flexibility and balance training programs. Provides general exercise guidelines with specific guidelines to those with heart or lung disease. Demonstration and skill practice provided.   Stress Management: - Provides group verbal and written instruction about the health risks of elevated stress, cause of high stress, and healthy ways to reduce stress.   Depression: - Provides group verbal and written instruction on the correlation between heart/lung disease and depressed mood, treatment options, and the stigmas associated with seeking treatment.   Exercise & Equipment Safety: - Individual verbal instruction and demonstration of  equipment use and safety with use of the equipment.   Pulmonary Rehab from 04/18/2017 in [facility] Cardiac and Pulmonary Rehab  Date  04/18/17  Educator  [facility]  Instruction Review Code  1- Verbalizes Understanding      Infection Prevention: - Provides verbal and written material to individual with discussion of infection control including proper hand washing and proper equipment cleaning during exercise session.   Pulmonary Rehab from 04/18/2017 in [facility] Cardiac and Pulmonary Rehab  Date  04/18/17  Educator  [facility]  Instruction Review Code  1- Verbalizes Understanding      Falls Prevention: - Provides verbal and written material to individual with discussion of falls prevention and safety.   Pulmonary Rehab from 04/18/2017 in [facility] Cardiac and Pulmonary Rehab  Date  04/18/17  Educator  [facility]  Instruction Review Code  1- Verbalizes Understanding      Diabetes: - Individual verbal and written instruction to review signs/symptoms of diabetes, desired ranges of glucose level fasting, after meals and with exercise. Advice that pre and post exercise glucose checks will be done for 3 sessions at entry of program.   Chronic Lung Diseases: - Group verbal and written instruction to review new updates, new respiratory medications, new advancements in procedures and treatments. Provide informative websites and "800" numbers of self-education.   Lung Procedures: - Group verbal and written instruction to describe testing methods done to diagnose lung disease. Review the outcome of test results. Describe the treatment choices: Pulmonary Function Tests, ABGs and oximetry.   Energy Conservation: - Provide group verbal and written instruction for methods to conserve energy, plan and organize activities. Instruct on pacing techniques, use of adaptive equipment and posture/positioning to relieve shortness of breath.   Triggers: - Group verbal and written instruction to review types of environmental controls: home  humidity, furnaces, filters, dust mite/pet prevention, HEPA vacuums. To discuss weather changes, air quality and the benefits of nasal washing.   Exacerbations: - Group verbal and written instruction to provide: warning signs, infection symptoms, calling MD promptly, preventive modes, and value of vaccinations. Review: effective airway clearance, coughing and/or vibration techniques. Create an Sports administrator.   Oxygen: - Individual and group verbal and written instruction on oxygen therapy. Includes supplement oxygen, available portable oxygen systems, continuous and intermittent flow rates, oxygen safety, concentrators, and Medicare reimbursement for oxygen.   Respiratory Medications: - Group verbal and  written instruction to review medications for lung disease. Drug class, frequency, complications, importance of spacers, rinsing mouth after steroid MDI's, and proper cleaning methods for nebulizers.   AED/CPR: - Group verbal and written instruction with the use of models to demonstrate the basic use of the AED with the basic ABC's of resuscitation.   Breathing Retraining: - Provides individuals verbal and written instruction on purpose, frequency, and proper technique of diaphragmatic breathing and pursed-lipped breathing. Applies individual practice skills.   Pulmonary Rehab from 04/18/2017 in [facility] Cardiac and Pulmonary Rehab  Date  04/18/17  Educator  [facility]  Instruction Review Code  1- Verbalizes Understanding      Anatomy and Physiology of the Lungs: - Group verbal and written instruction with the use of models to provide basic lung anatomy and physiology related to function, structure and complications of lung disease.   Anatomy & Physiology of the Heart: - Group verbal and written instruction and models provide basic cardiac anatomy and physiology, with the coronary electrical and arterial systems. Review of: AMI, Angina, Valve disease, Heart Failure, Cardiac Arrhythmia, Pacemakers,  and the ICD.   Heart Failure: - Group verbal and written instruction on the basics of heart failure: signs/symptoms, treatments, explanation of ejection fraction, enlarged heart and cardiomyopathy.   Sleep Apnea: - Individual verbal and written instruction to review Obstructive Sleep Apnea. Review of risk factors, methods for diagnosing and types of masks and machines for OSA.   Anxiety: - Provides group, verbal and written instruction on the correlation between heart/lung disease and anxiety, treatment options, and management of anxiety.   Relaxation: - Provides group, verbal and written instruction about the benefits of relaxation for patients with heart/lung disease. Also provides patients with examples of relaxation techniques.   Cardiac Medications: - Group verbal and written instruction to review commonly prescribed medications for heart disease. Reviews the medication, class of the drug, and side effects.   Know Your Numbers: -Group verbal and written instruction about important numbers in your health.  Review of Cholesterol, Blood Pressure, Diabetes, and BMI and the role they play in your overall health.   Other: -Provides group and verbal instruction on various topics (see comments)    Knowledge Questionnaire Score: Knowledge Questionnaire Score - 04/18/17 1126      Knowledge Questionnaire Score   Pre Score  13/18 reviewed with patient        Core Components/Risk Factors/Patient Goals at Admission: Personal Goals and Risk Factors at Admission - 04/18/17 1136      Core Components/Risk Factors/Patient Goals on Admission    Weight Management  Yes;Weight Maintenance    Intervention  Weight Management: Develop a combined nutrition and exercise program designed to reach desired caloric intake, while maintaining appropriate intake of nutrient and fiber, sodium and fats, and appropriate energy expenditure required for the weight goal.;Weight Management: Provide education  and appropriate resources to help participant work on and attain dietary goals.;Weight Management/Obesity: Establish reasonable short term and long term weight goals.    Admit Weight  127 lb 9.6 oz (57.9 kg)    Goal Weight: Short Term  127 lb (57.6 kg)    Goal Weight: Long Term  127 lb (57.6 kg)    Expected Outcomes  Short Term: Continue to assess and modify interventions until short term weight is achieved;Long Term: Adherence to nutrition and physical activity/exercise program aimed toward attainment of established weight goal;Weight Maintenance: Understanding of the daily nutrition guidelines, which includes 25-35% calories from fat, 7% or less cal from  saturated fats, less than 200mg  cholesterol, less than 1.5gm of sodium, & 5 or more servings of fruits and vegetables daily;Understanding recommendations for meals to include 15-35% energy as protein, 25-35% energy from fat, 35-60% energy from carbohydrates, less than 200mg  of dietary cholesterol, 20-35 gm of total fiber daily;Understanding of distribution of calorie intake throughout the day with the consumption of 4-5 meals/snacks    Improve shortness of breath with ADL's  Yes    Intervention  Provide education, individualized exercise plan and daily activity instruction to help decrease symptoms of SOB with activities of daily living.    Expected Outcomes  Short Term: Achieves a reduction of symptoms when performing activities of daily living.    Heart Failure  Yes    Intervention  Provide a combined exercise and nutrition program that is supplemented with education, support and counseling about heart failure. Directed toward relieving symptoms such as shortness of breath, decreased exercise tolerance, and extremity edema.    Expected Outcomes  Improve functional capacity of life;Short term: Attendance in program 2-3 days a week with increased exercise capacity. Reported lower sodium intake. Reported increased fruit and vegetable intake. Reports  medication compliance.;Short term: Daily weights obtained and reported for increase. Utilizing diuretic protocols set by physician.;Long term: Adoption of self-care skills and reduction of barriers for early signs and symptoms recognition and intervention leading to self-care maintenance.    Hypertension  Yes takes medication     Intervention  Provide education on lifestyle modifcations including regular physical activity/exercise, weight management, moderate sodium restriction and increased consumption of fresh fruit, vegetables, and low fat dairy, alcohol moderation, and smoking cessation.;Monitor prescription use compliance.    Expected Outcomes  Short Term: Continued assessment and intervention until BP is < 140/66mm HG in hypertensive participants. < 130/49mm HG in hypertensive participants with diabetes, heart failure or chronic kidney disease.;Long Term: Maintenance of blood pressure at goal levels.       Core Components/Risk Factors/Patient Goals Review:    Core Components/Risk Factors/Patient Goals at Discharge (Final Review):    ITP Comments: ITP Comments    Row Name 04/18/17 1058           ITP Comments  Medical Evaluation completed. Chart sent for review and changes to Dr. Emily Filbert Director of [place]. Diagnosis can be found in [facility] encounter 04/18/17          Comments: Initial ITP

## 2017-04-18 NOTE — Patient Instructions (Signed)
Patient Instructions  Patient Details  Name: Mary Howe MRN: [id number] Date of Birth: 04-11-1933 Referring Provider:  [name], MD  Below are the personal goals you chose as well as exercise and nutrition goals. Our goal is to help you keep on track towards obtaining and maintaining your goals. We will be discussing your progress on these goals with you throughout the program.  Initial Exercise Prescription: Initial Exercise Prescription - 04/18/17 1200      Date of Initial Exercise RX and Referring Provider   Date  04/18/17    Referring Provider  [place]      Treadmill   MPH  1.8    Grade  0    Minutes  15    METs  2      NuStep   Level  2    SPM  80    Minutes  15    METs  2      REL-XR   Level  2    Speed  50    Minutes  15    METs  2      Prescription Details   Frequency (times per week)  3    Duration  Progress to 45 minutes of aerobic exercise without signs/symptoms of physical distress      Intensity   THRR 40-80% of Max Heartrate  102-126    Ratings of Perceived Exertion  11-13    Perceived Dyspnea  0-4      Resistance Training   Training Prescription  Yes    Weight  2 lb    Reps  10-15       Exercise Goals: Frequency: Be able to perform aerobic exercise three times per week working toward 3-5 days per week.  Intensity: Work with a perceived exertion of 11 (fairly light) - 15 (hard) as tolerated. Follow your new exercise prescription and watch for changes in prescription as you progress with the program. Changes will be reviewed with you when they are made.  Duration: You should be able to do 30 minutes of continuous aerobic exercise in addition to a 5 minute warm-up and a 5 minute cool-down routine.  Nutrition Goals: Your personal nutrition goals will be established when you do your nutrition analysis with the dietician.  The following are nutrition guidelines to follow: Cholesterol < 200mg /day Sodium < 1500mg /day Fiber: Women over 50  yrs - 21 grams per day  Personal Goals: Personal Goals and Risk Factors at Admission - 04/18/17 1136      Core Components/Risk Factors/Patient Goals on Admission    Weight Management  Yes;Weight Maintenance    Intervention  Weight Management: Develop a combined nutrition and exercise program designed to reach desired caloric intake, while maintaining appropriate intake of nutrient and fiber, sodium and fats, and appropriate energy expenditure required for the weight goal.;Weight Management: Provide education and appropriate resources to help participant work on and attain dietary goals.;Weight Management/Obesity: Establish reasonable short term and long term weight goals.    Admit Weight  127 lb 9.6 oz (57.9 kg)    Goal Weight: Short Term  127 lb (57.6 kg)    Goal Weight: Long Term  127 lb (57.6 kg)    Expected Outcomes  Short Term: Continue to assess and modify interventions until short term weight is achieved;Long Term: Adherence to nutrition and physical activity/exercise program aimed toward attainment of established weight goal;Weight Maintenance: Understanding of the daily nutrition guidelines, which includes 25-35% calories from fat, 7% or less  cal from saturated fats, less than 200mg  cholesterol, less than 1.5gm of sodium, & 5 or more servings of fruits and vegetables daily;Understanding recommendations for meals to include 15-35% energy as protein, 25-35% energy from fat, 35-60% energy from carbohydrates, less than 200mg  of dietary cholesterol, 20-35 gm of total fiber daily;Understanding of distribution of calorie intake throughout the day with the consumption of 4-5 meals/snacks    Improve shortness of breath with ADL's  Yes    Intervention  Provide education, individualized exercise plan and daily activity instruction to help decrease symptoms of SOB with activities of daily living.    Expected Outcomes  Short Term: Achieves a reduction of symptoms when performing activities of daily living.     Heart Failure  Yes    Intervention  Provide a combined exercise and nutrition program that is supplemented with education, support and counseling about heart failure. Directed toward relieving symptoms such as shortness of breath, decreased exercise tolerance, and extremity edema.    Expected Outcomes  Improve functional capacity of life;Short term: Attendance in program 2-3 days a week with increased exercise capacity. Reported lower sodium intake. Reported increased fruit and vegetable intake. Reports medication compliance.;Short term: Daily weights obtained and reported for increase. Utilizing diuretic protocols set by physician.;Long term: Adoption of self-care skills and reduction of barriers for early signs and symptoms recognition and intervention leading to self-care maintenance.    Hypertension  Yes takes medication     Intervention  Provide education on lifestyle modifcations including regular physical activity/exercise, weight management, moderate sodium restriction and increased consumption of fresh fruit, vegetables, and low fat dairy, alcohol moderation, and smoking cessation.;Monitor prescription use compliance.    Expected Outcomes  Short Term: Continued assessment and intervention until BP is < 140/73mm HG in hypertensive participants. < 130/40mm HG in hypertensive participants with diabetes, heart failure or chronic kidney disease.;Long Term: Maintenance of blood pressure at goal levels.       Tobacco Use Initial Evaluation: Social History   Tobacco Use  Smoking Status Former Smoker  . Packs/day: 1.50  . Years: 29.00  . Pack years: 43.50  . Types: Cigarettes  . Last attempt to quit: 04/11/1988  . Years since quitting: 29.0  Smokeless Tobacco Never Used  Tobacco Comment   quit smoking in 1990    Exercise Goals and Review: Exercise Goals    Row Name 04/18/17 1227             Exercise Goals   Increase Physical Activity  Yes       Intervention  Provide advice,  education, support and counseling about physical activity/exercise needs.;Develop an individualized exercise prescription for aerobic and resistive training based on initial evaluation findings, risk stratification, comorbidities and participant's personal goals.       Expected Outcomes  Achievement of increased cardiorespiratory fitness and enhanced flexibility, muscular endurance and strength shown through measurements of functional capacity and personal statement of participant.       Increase Strength and Stamina  Yes       Intervention  Provide advice, education, support and counseling about physical activity/exercise needs.;Develop an individualized exercise prescription for aerobic and resistive training based on initial evaluation findings, risk stratification, comorbidities and participant's personal goals.       Expected Outcomes  Achievement of increased cardiorespiratory fitness and enhanced flexibility, muscular endurance and strength shown through measurements of functional capacity and personal statement of participant.       Able to understand and use rate of perceived exertion (  RPE) scale  Yes       Intervention  Provide education and explanation on how to use RPE scale       Expected Outcomes  Short Term: Able to use RPE daily in rehab to express subjective intensity level;Long Term:  Able to use RPE to guide intensity level when exercising independently       Able to understand and use Dyspnea scale  Yes       Intervention  Provide education and explanation on how to use Dyspnea scale       Expected Outcomes  Short Term: Able to use Dyspnea scale daily in rehab to express subjective sense of shortness of breath during exertion;Long Term: Able to use Dyspnea scale to guide intensity level when exercising independently       Knowledge and understanding of Target Heart Rate Range (THRR)  Yes       Intervention  Provide education and explanation of THRR including how the numbers were  predicted and where they are located for reference       Expected Outcomes  Short Term: Able to state/look up THRR;Long Term: Able to use THRR to govern intensity when exercising independently;Short Term: Able to use daily as guideline for intensity in rehab       Able to check pulse independently  Yes       Intervention  Provide education and demonstration on how to check pulse in carotid and radial arteries.;Review the importance of being able to check your own pulse for safety during independent exercise       Expected Outcomes  Short Term: Able to explain why pulse checking is important during independent exercise;Long Term: Able to check pulse independently and accurately       Understanding of Exercise Prescription  Yes       Intervention  Provide education, explanation, and written materials on patient's individual exercise prescription       Expected Outcomes  Short Term: Able to explain program exercise prescription;Long Term: Able to explain home exercise prescription to exercise independently          Copy of goals given to participant.

## 2017-04-24 DIAGNOSIS — J432 Centrilobular emphysema: Secondary | ICD-10-CM

## 2017-04-24 NOTE — Progress Notes (Signed)
Pulmonary Individual Treatment Plan  Patient Details  Name: Mary Howe MRN: [id number] Date of Birth: 11-27-1932 Referring Provider:     Pulmonary Rehab from 04/18/2017 in [facility] Cardiac and Pulmonary Rehab  Referring Provider  Derrel Nip      Initial Encounter Date:    Pulmonary Rehab from 04/18/2017 in [facility] Cardiac and Pulmonary Rehab  Date  04/18/17  Referring Provider  Derrel Nip      Visit Diagnosis: Centrilobular emphysema ([place])  Patient's Home Medications on Admission:  Current Outpatient Medications:  .  acetaminophen (TYLENOL) 500 MG tablet, Take 500 mg by mouth every 6 (six) hours as needed., Disp: , Rfl:  .  acyclovir (ZOVIRAX) 400 MG tablet, Take 1 tablet (400 mg total) by mouth 5 (five) times daily., Disp: 35 tablet, Rfl: 0 .  albuterol (PROVENTIL HFA;VENTOLIN HFA) 108 (90 Base) MCG/ACT inhaler, Inhale 1-2 puffs into the lungs every 4 (four) hours as needed for wheezing or shortness of breath., Disp: 1 Inhaler, Rfl: 10 .  alendronate (FOSAMAX) 70 MG tablet, Take 1 tablet (70 mg total) by mouth every 7 (seven) days. Take with a full glass of water on an empty stomach., Disp: 4 tablet, Rfl: 11 .  benzocaine (ORAJEL) 10 % mucosal gel, Use as directed in the mouth or throat 3 (three) times daily as needed for mouth pain., Disp: 5.3 g, Rfl: 0 .  bisoprolol (ZEBETA) 5 MG tablet, Take 0.5 tablets (2.5 mg total) by mouth daily., Disp: 30 tablet, Rfl: 0 .  budesonide (PULMICORT) 0.5 MG/2ML nebulizer solution, Take 2 mLs (0.5 mg total) by nebulization 2 (two) times daily., Disp: 360 mL, Rfl: 3 .  calcium carbonate (OS-CAL) 600 MG TABS tablet, Take 600 mg by mouth daily with breakfast. , Disp: , Rfl:  .  cetirizine (ZYRTEC) 10 MG tablet, Take 10 mg by mouth daily., Disp: , Rfl:  .  diphenhydrAMINE (BENADRYL) 25 MG tablet, Take 25 mg by mouth at bedtime as needed., Disp: , Rfl:  .  escitalopram (LEXAPRO) 10 MG tablet, TAKE 1 TABLET(10 MG) BY MOUTH DAILY, Disp: 30 tablet, Rfl: 5 .  furosemide  (LASIX) 20 MG tablet, Take 1 tablet (20 mg total) by mouth 2 (two) times daily., Disp: 60 tablet, Rfl: 11 .  guaiFENesin (ROBITUSSIN) 100 MG/5ML SOLN, Take 10 mLs (200 mg total) by mouth every 4 (four) hours as needed for cough or to loosen phlegm., Disp: 1200 mL, Rfl: 0 .  lidocaine (LIDODERM) 5 %, Place 1 patch onto the skin daily. Remove & Discard patch within 12 hours or as directed by MD, Disp: 30 patch, Rfl: 0 .  MULTIPLE VITAMIN PO, Take 1 tablet by mouth daily. , Disp: , Rfl:  .  OXYGEN, Inhale 2.5 L daily into the lungs., Disp: , Rfl:  .  pantoprazole (PROTONIX) 40 MG tablet, TAKE 1 TABLET(40 MG) BY MOUTH DAILY, Disp: 90 tablet, Rfl: 0 .  predniSONE (DELTASONE) 10 MG tablet, 6 tablets on Day 1 , then reduce by 1 tablet daily until gone, Disp: 21 tablet, Rfl: 0 .  valACYclovir (VALTREX) 1000 MG tablet, Take 1,000 mg by mouth 2 (two) times daily. , Disp: , Rfl:   Past Medical History: Past Medical History:  Diagnosis Date  . COPD (chronic obstructive pulmonary disease) ([place])   . Diverticulitis 2015  . Essential hypertension   . GERD (gastroesophageal reflux disease)   . Hyperlipidemia   . PAH (pulmonary artery hypertension) ([place])    a. 01/2017 Echo: PASP 24mmHg; b. 01/2017 RHC: mild PAH.  Marland Kitchen  Takotsubo cardiomyopathy    a. 01/2017 Echo: EF 25-30%, sev glob HK w/ basal regions moving well. Gr1 DD. mild MR, mildly dil LA, nl RV fxn, mild to mod TR, PASP 32mmHg; b. 01/2017 Cath: Nl cors w/ EF 35-45%. HK of distal segments suggestive of Takotsubo CM.   Marland Kitchen Thyroid disease     Tobacco Use: Social History   Tobacco Use  Smoking Status Former Smoker  . Packs/day: 1.50  . Years: 29.00  . Pack years: 43.50  . Types: Cigarettes  . Last attempt to quit: 04/11/1988  . Years since quitting: 29.0  Smokeless Tobacco Never Used  Tobacco Comment   quit smoking in 1990    Labs: Recent Review Flowsheet Data    Labs for ITP Cardiac and Pulmonary Rehab Latest Ref Rng & Units 07/01/2016 11/10/2016  12/14/2016 01/11/2017 01/11/2017   Cholestrol 0 - 200 mg/dL 186 151 188 - -   LDLCALC 0 - 99 mg/dL 84 46 84 - -   LDLDIRECT mg/dL 89.0 - - - -   HDL >39.00 mg/dL 81.90 93.80 88.80 - -   Trlycerides 0.0 - 149.0 mg/dL 99.0 56.0 79.0 - -   Hemoglobin A1c 4.6 - 6.5 % 6.4 6.1 - - -   PHART 7.350 - 7.450 - - - - 7.21(L)   PCO2ART 32.0 - 48.0 mmHg - - - - 48   HCO3 20.0 - 28.0 mmol/L - - - 25.3 19.2(L)   ACIDBASEDEF 0.0 - 2.0 mmol/L - - - 10.5(H) 8.8(H)   O2SAT % - - - 84.6 99.0       Pulmonary Assessment Scores: Pulmonary Assessment Scores    Row Name 04/18/17 1122         ADL UCSD   ADL Phase  Entry     SOB Score total  13     Rest  1     Walk  0     Stairs  1     Bath  0     Dress  0     Shop  0       CAT Score   CAT Score  14       mMRC Score   mMRC Score  1        Pulmonary Function Assessment: Pulmonary Function Assessment - 04/18/17 1124      Breath   Bilateral Breath Sounds  Clear    Shortness of Breath  No       Exercise Target Goals:    Exercise Program Goal: Individual exercise prescription set with THRR, safety & activity barriers. Participant demonstrates ability to understand and report RPE using BORG scale, to self-measure pulse accurately, and to acknowledge the importance of the exercise prescription.  Exercise Prescription Goal: Starting with aerobic activity 30 plus minutes a day, 3 days per week for initial exercise prescription. Provide home exercise prescription and guidelines that participant acknowledges understanding prior to discharge.  Activity Barriers & Risk Stratification:   6 Minute Walk: 6 Minute Walk    Row Name 04/18/17 1227         6 Minute Walk   Distance  1040 feet     Walk Time  6 minutes     # of Rest Breaks  0     MPH  1.97     METS  1.98     RPE  10     Perceived Dyspnea   1     VO2 Peak  6.94  Symptoms  No     Resting HR  78 bpm     Resting BP  134/64     Resting Oxygen Saturation   95 %     Exercise  Oxygen Saturation  during 6 min walk  89 %     Max Ex. HR  96 bpm     Max Ex. BP  130/60     2 Minute Post BP  120/60       Interval HR   1 Minute HR  82     2 Minute HR  79     4 Minute HR  86     5 Minute HR  92     6 Minute HR  96     2 Minute Post HR  74     Interval Heart Rate?  Yes       Interval Oxygen   Interval Oxygen?  Yes     Baseline Oxygen Saturation %  95 %     1 Minute Oxygen Saturation %  94 %     1 Minute Liters of Oxygen  0 L     2 Minute Oxygen Saturation %  89 %     2 Minute Liters of Oxygen  0 L     3 Minute Liters of Oxygen  0 L     4 Minute Oxygen Saturation %  94 %     4 Minute Liters of Oxygen  0 L     5 Minute Oxygen Saturation %  93 %     5 Minute Liters of Oxygen  0 L     6 Minute Oxygen Saturation %  91 %     6 Minute Liters of Oxygen  0 L     2 Minute Post Oxygen Saturation %  95 %     2 Minute Post Liters of Oxygen  0 L       Oxygen Initial Assessment: Oxygen Initial Assessment - 04/18/17 1126      Home Oxygen   Home Oxygen Device  Home Concentrator;E-Tanks    Sleep Oxygen Prescription  Continuous    Liters per minute  2.5    Home Exercise Oxygen Prescription  Continuous she uses only when things are strenuous    Liters per minute  2.5    Home at Rest Exercise Oxygen Prescription  Continuous    Liters per minute  2.5    Compliance with Home Oxygen Use  Yes She does not use her oxygen all the time at home. Her oxygen saturations have been better. When she uses exertion she uses oxygen      Initial 6 min Walk   Oxygen Used  None      Program Oxygen Prescription   Program Oxygen Prescription  None      Intervention   Short Term Goals  To learn and understand importance of maintaining oxygen saturations>88%;To learn and demonstrate proper pursed lip breathing techniques or other breathing techniques.;To learn and understand importance of monitoring SPO2 with pulse oximeter and demonstrate accurate use of the pulse oximeter.;To learn and  exhibit compliance with exercise, home and travel O2 prescription;To learn and demonstrate proper use of respiratory medications    Long  Term Goals  Exhibits compliance with exercise, home and travel O2 prescription;Verbalizes importance of monitoring SPO2 with pulse oximeter and return demonstration;Maintenance of O2 saturations>88%;Exhibits proper breathing techniques, such as pursed lip breathing or other method taught during program session;Compliance with respiratory  medication;Demonstrates proper use of MDI's       Oxygen Re-Evaluation:   Oxygen Discharge (Final Oxygen Re-Evaluation):   Initial Exercise Prescription: Initial Exercise Prescription - 04/18/17 1200      Date of Initial Exercise RX and Referring Provider   Date  04/18/17    Referring Provider  Tullo      Treadmill   MPH  1.8    Grade  0    Minutes  15    METs  2      NuStep   Level  2    SPM  80    Minutes  15    METs  2      REL-XR   Level  2    Speed  50    Minutes  15    METs  2      Prescription Details   Frequency (times per week)  3    Duration  Progress to 45 minutes of aerobic exercise without signs/symptoms of physical distress      Intensity   THRR 40-80% of Max Heartrate  102-126    Ratings of Perceived Exertion  11-13    Perceived Dyspnea  0-4      Resistance Training   Training Prescription  Yes    Weight  2 lb    Reps  10-15       Perform Capillary Blood Glucose checks as needed.  Exercise Prescription Changes:   Exercise Comments:   Exercise Goals and Review: Exercise Goals    Row Name 04/18/17 1227             Exercise Goals   Increase Physical Activity  Yes       Intervention  Provide advice, education, support and counseling about physical activity/exercise needs.;Develop an individualized exercise prescription for aerobic and resistive training based on initial evaluation findings, risk stratification, comorbidities and participant's personal goals.        Expected Outcomes  Achievement of increased cardiorespiratory fitness and enhanced flexibility, muscular endurance and strength shown through measurements of functional capacity and personal statement of participant.       Increase Strength and Stamina  Yes       Intervention  Provide advice, education, support and counseling about physical activity/exercise needs.;Develop an individualized exercise prescription for aerobic and resistive training based on initial evaluation findings, risk stratification, comorbidities and participant's personal goals.       Expected Outcomes  Achievement of increased cardiorespiratory fitness and enhanced flexibility, muscular endurance and strength shown through measurements of functional capacity and personal statement of participant.       Able to understand and use rate of perceived exertion (RPE) scale  Yes       Intervention  Provide education and explanation on how to use RPE scale       Expected Outcomes  Short Term: Able to use RPE daily in rehab to express subjective intensity level;Long Term:  Able to use RPE to guide intensity level when exercising independently       Able to understand and use Dyspnea scale  Yes       Intervention  Provide education and explanation on how to use Dyspnea scale       Expected Outcomes  Short Term: Able to use Dyspnea scale daily in rehab to express subjective sense of shortness of breath during exertion;Long Term: Able to use Dyspnea scale to guide intensity level when exercising independently       Knowledge and  understanding of Target Heart Rate Range (THRR)  Yes       Intervention  Provide education and explanation of THRR including how the numbers were predicted and where they are located for reference       Expected Outcomes  Short Term: Able to state/look up THRR;Long Term: Able to use THRR to govern intensity when exercising independently;Short Term: Able to use daily as guideline for intensity in rehab       Able to  check pulse independently  Yes       Intervention  Provide education and demonstration on how to check pulse in carotid and radial arteries.;Review the importance of being able to check your own pulse for safety during independent exercise       Expected Outcomes  Short Term: Able to explain why pulse checking is important during independent exercise;Long Term: Able to check pulse independently and accurately       Understanding of Exercise Prescription  Yes       Intervention  Provide education, explanation, and written materials on patient's individual exercise prescription       Expected Outcomes  Short Term: Able to explain program exercise prescription;Long Term: Able to explain home exercise prescription to exercise independently          Exercise Goals Re-Evaluation :   Discharge Exercise Prescription (Final Exercise Prescription Changes):   Nutrition:  Target Goals: Understanding of nutrition guidelines, daily intake of sodium 1500mg , cholesterol 200mg , calories 30% from fat and 7% or less from saturated fats, daily to have 5 or more servings of fruits and vegetables.  Biometrics: Pre Biometrics - 04/18/17 1226      Pre Biometrics   Height  5\' 4"  (1.626 m)    Weight  127 lb 9.6 oz (57.9 kg)    Waist Circumference  29.75 inches    Hip Circumference  39 inches    Waist to Hip Ratio  0.76 %    BMI (Calculated)  21.89        Nutrition Therapy Plan and Nutrition Goals: Nutrition Therapy & Goals - 04/18/17 1121      Personal Nutrition Goals   Comments  She plans to meet with the dietician. She states she eats pretty well and watches what she eats.       Intervention Plan   Intervention  Nutrition handout(s) given to patient.;Prescribe, educate and counsel regarding individualized specific dietary modifications aiming towards targeted core components such as weight, hypertension, lipid management, diabetes, heart failure and other comorbidities.    Expected Outcomes  Short  Term Goal: Understand basic principles of dietary content, such as calories, fat, sodium, cholesterol and nutrients.;Long Term Goal: Adherence to prescribed nutrition plan.       Nutrition Discharge: Rate Your Plate Scores:   Nutrition Goals Re-Evaluation:   Nutrition Goals Discharge (Final Nutrition Goals Re-Evaluation):   Psychosocial: Target Goals: Acknowledge presence or absence of significant depression and/or stress, maximize coping skills, provide positive support system. Participant is able to verbalize types and ability to use techniques and skills needed for reducing stress and depression.   Initial Review & Psychosocial Screening: Initial Psych Review & Screening - 04/18/17 1116      Initial Review   Current issues with  Current Stress Concerns    Source of Stress Concerns  Unable to perform yard/household activities    Comments  Under some stress. Her sister that she lives with is 83 and sometimes she gets on her nerves. She does not get  short of breath that often but sometimes she gets short of breath when she is grocery shopping. She moved from [place] in 2014.       Family Dynamics   Good Support System?  Yes    Comments  Her daughter helps her when she needs it.       Barriers   Psychosocial barriers to participate in program  The patient should benefit from training in stress management and relaxation.      Screening Interventions   Interventions  Yes;Encouraged to exercise;Program counselor consult;Provide feedback about the scores to participant;To provide support and resources with identified psychosocial needs    Expected Outcomes  Short Term goal: Utilizing psychosocial counselor, staff and physician to assist with identification of specific Stressors or current issues interfering with healing process. Setting desired goal for each stressor or current issue identified.;Long Term Goal: Stressors or current issues are controlled or eliminated.;Short Term goal:  Identification and review with participant of any Quality of Life or Depression concerns found by scoring the questionnaire.;Long Term goal: The participant improves quality of Life and PHQ9 Scores as seen by post scores and/or verbalization of changes       Quality of Life Scores:   PHQ-9: Recent Review Flowsheet Data    Depression screen [facility] 2/9 04/18/2017 09/20/2016 06/22/2016 02/23/2015   Decreased Interest 0 0 0 0   Down, Depressed, Hopeless 0 0 0 0   PHQ - 2 Score 0 0 0 0   Altered sleeping 0 - - -   Tired, decreased energy 0 - - -   Change in appetite 0 - - -   Feeling bad or failure about yourself  0 - - -   Trouble concentrating 0 - - -   Moving slowly or fidgety/restless 0 - - -   Suicidal thoughts 0 - - -   PHQ-9 Score 0 - - -   Difficult doing work/chores Not difficult at all - - -     Interpretation of Total Score  Total Score Depression Severity:  1-4 = Minimal depression, 5-9 = Mild depression, 10-14 = Moderate depression, 15-19 = Moderately severe depression, 20-27 = Severe depression   Psychosocial Evaluation and Intervention:   Psychosocial Re-Evaluation:   Psychosocial Discharge (Final Psychosocial Re-Evaluation):   Education: Education Goals: Education classes will be provided on a weekly basis, covering required topics. Participant will state understanding/return demonstration of topics presented.  Learning Barriers/Preferences: Learning Barriers/Preferences - 04/18/17 1126      Learning Barriers/Preferences   Learning Barriers  Sight wears glasses    Learning Preferences  None       Education Topics: Initial Evaluation Education: - Verbal, written and demonstration of respiratory meds, RPE/PD scales, oximetry and breathing techniques. Instruction on use of nebulizers and MDIs: cleaning and proper use, rinsing mouth with steroid doses and importance of monitoring MDI activations.   Pulmonary Rehab from 04/18/2017 in [facility] Cardiac and Pulmonary Rehab   Date  04/18/17  Educator  [facility]  Instruction Review Code  1- Verbalizes Understanding      General Nutrition Guidelines/Fats and Fiber: -Group instruction provided by verbal, written material, models and posters to present the general guidelines for heart healthy nutrition. Gives an explanation and review of dietary fats and fiber.   Controlling Sodium/Reading Food Labels: -Group verbal and written material supporting the discussion of sodium use in heart healthy nutrition. Review and explanation with models, verbal and written materials for utilization of the food label.   Exercise Physiology &  Risk Factors: - Group verbal and written instruction with models to review the exercise physiology of the cardiovascular system and associated critical values. Details cardiovascular disease risk factors and the goals associated with each risk factor.   Aerobic Exercise & Resistance Training: - Gives group verbal and written discussion on the health impact of inactivity. On the components of aerobic and resistive training programs and the benefits of this training and how to safely progress through these programs.   Flexibility, Balance, General Exercise Guidelines: - Provides group verbal and written instruction on the benefits of flexibility and balance training programs. Provides general exercise guidelines with specific guidelines to those with heart or lung disease. Demonstration and skill practice provided.   Stress Management: - Provides group verbal and written instruction about the health risks of elevated stress, cause of high stress, and healthy ways to reduce stress.   Depression: - Provides group verbal and written instruction on the correlation between heart/lung disease and depressed mood, treatment options, and the stigmas associated with seeking treatment.   Exercise & Equipment Safety: - Individual verbal instruction and demonstration of equipment use and safety with use of  the equipment.   Pulmonary Rehab from 04/18/2017 in [facility] Cardiac and Pulmonary Rehab  Date  04/18/17  Educator  [facility]  Instruction Review Code  1- Verbalizes Understanding      Infection Prevention: - Provides verbal and written material to individual with discussion of infection control including proper hand washing and proper equipment cleaning during exercise session.   Pulmonary Rehab from 04/18/2017 in [facility] Cardiac and Pulmonary Rehab  Date  04/18/17  Educator  [facility]  Instruction Review Code  1- Verbalizes Understanding      Falls Prevention: - Provides verbal and written material to individual with discussion of falls prevention and safety.   Pulmonary Rehab from 04/18/2017 in [facility] Cardiac and Pulmonary Rehab  Date  04/18/17  Educator  [facility]  Instruction Review Code  1- Verbalizes Understanding      Diabetes: - Individual verbal and written instruction to review signs/symptoms of diabetes, desired ranges of glucose level fasting, after meals and with exercise. Advice that pre and post exercise glucose checks will be done for 3 sessions at entry of program.   Chronic Lung Diseases: - Group verbal and written instruction to review new updates, new respiratory medications, new advancements in procedures and treatments. Provide informative websites and "800" numbers of self-education.   Lung Procedures: - Group verbal and written instruction to describe testing methods done to diagnose lung disease. Review the outcome of test results. Describe the treatment choices: Pulmonary Function Tests, ABGs and oximetry.   Energy Conservation: - Provide group verbal and written instruction for methods to conserve energy, plan and organize activities. Instruct on pacing techniques, use of adaptive equipment and posture/positioning to relieve shortness of breath.   Triggers: - Group verbal and written instruction to review types of environmental controls: home humidity, furnaces, filters, dust  mite/pet prevention, HEPA vacuums. To discuss weather changes, air quality and the benefits of nasal washing.   Exacerbations: - Group verbal and written instruction to provide: warning signs, infection symptoms, calling MD promptly, preventive modes, and value of vaccinations. Review: effective airway clearance, coughing and/or vibration techniques. Create an Sports administrator.   Oxygen: - Individual and group verbal and written instruction on oxygen therapy. Includes supplement oxygen, available portable oxygen systems, continuous and intermittent flow rates, oxygen safety, concentrators, and Medicare reimbursement for oxygen.   Respiratory Medications: - Group verbal and  written instruction to review medications for lung disease. Drug class, frequency, complications, importance of spacers, rinsing mouth after steroid MDI's, and proper cleaning methods for nebulizers.   AED/CPR: - Group verbal and written instruction with the use of models to demonstrate the basic use of the AED with the basic ABC's of resuscitation.   Breathing Retraining: - Provides individuals verbal and written instruction on purpose, frequency, and proper technique of diaphragmatic breathing and pursed-lipped breathing. Applies individual practice skills.   Pulmonary Rehab from 04/18/2017 in [facility] Cardiac and Pulmonary Rehab  Date  04/18/17  Educator  [facility]  Instruction Review Code  1- Verbalizes Understanding      Anatomy and Physiology of the Lungs: - Group verbal and written instruction with the use of models to provide basic lung anatomy and physiology related to function, structure and complications of lung disease.   Anatomy & Physiology of the Heart: - Group verbal and written instruction and models provide basic cardiac anatomy and physiology, with the coronary electrical and arterial systems. Review of: AMI, Angina, Valve disease, Heart Failure, Cardiac Arrhythmia, Pacemakers, and the ICD.   Heart Failure: -  Group verbal and written instruction on the basics of heart failure: signs/symptoms, treatments, explanation of ejection fraction, enlarged heart and cardiomyopathy.   Sleep Apnea: - Individual verbal and written instruction to review Obstructive Sleep Apnea. Review of risk factors, methods for diagnosing and types of masks and machines for OSA.   Anxiety: - Provides group, verbal and written instruction on the correlation between heart/lung disease and anxiety, treatment options, and management of anxiety.   Relaxation: - Provides group, verbal and written instruction about the benefits of relaxation for patients with heart/lung disease. Also provides patients with examples of relaxation techniques.   Cardiac Medications: - Group verbal and written instruction to review commonly prescribed medications for heart disease. Reviews the medication, class of the drug, and side effects.   Know Your Numbers: -Group verbal and written instruction about important numbers in your health.  Review of Cholesterol, Blood Pressure, Diabetes, and BMI and the role they play in your overall health.   Other: -Provides group and verbal instruction on various topics (see comments)    Knowledge Questionnaire Score: Knowledge Questionnaire Score - 04/18/17 1126      Knowledge Questionnaire Score   Pre Score  13/18 reviewed with patient        Core Components/Risk Factors/Patient Goals at Admission: Personal Goals and Risk Factors at Admission - 04/18/17 1136      Core Components/Risk Factors/Patient Goals on Admission    Weight Management  Yes;Weight Maintenance    Intervention  Weight Management: Develop a combined nutrition and exercise program designed to reach desired caloric intake, while maintaining appropriate intake of nutrient and fiber, sodium and fats, and appropriate energy expenditure required for the weight goal.;Weight Management: Provide education and appropriate resources to help  participant work on and attain dietary goals.;Weight Management/Obesity: Establish reasonable short term and long term weight goals.    Admit Weight  127 lb 9.6 oz (57.9 kg)    Goal Weight: Short Term  127 lb (57.6 kg)    Goal Weight: Long Term  127 lb (57.6 kg)    Expected Outcomes  Short Term: Continue to assess and modify interventions until short term weight is achieved;Long Term: Adherence to nutrition and physical activity/exercise program aimed toward attainment of established weight goal;Weight Maintenance: Understanding of the daily nutrition guidelines, which includes 25-35% calories from fat, 7% or less cal from  saturated fats, less than 200mg  cholesterol, less than 1.5gm of sodium, & 5 or more servings of fruits and vegetables daily;Understanding recommendations for meals to include 15-35% energy as protein, 25-35% energy from fat, 35-60% energy from carbohydrates, less than 200mg  of dietary cholesterol, 20-35 gm of total fiber daily;Understanding of distribution of calorie intake throughout the day with the consumption of 4-5 meals/snacks    Improve shortness of breath with ADL's  Yes    Intervention  Provide education, individualized exercise plan and daily activity instruction to help decrease symptoms of SOB with activities of daily living.    Expected Outcomes  Short Term: Achieves a reduction of symptoms when performing activities of daily living.    Heart Failure  Yes    Intervention  Provide a combined exercise and nutrition program that is supplemented with education, support and counseling about heart failure. Directed toward relieving symptoms such as shortness of breath, decreased exercise tolerance, and extremity edema.    Expected Outcomes  Improve functional capacity of life;Short term: Attendance in program 2-3 days a week with increased exercise capacity. Reported lower sodium intake. Reported increased fruit and vegetable intake. Reports medication compliance.;Short term:  Daily weights obtained and reported for increase. Utilizing diuretic protocols set by physician.;Long term: Adoption of self-care skills and reduction of barriers for early signs and symptoms recognition and intervention leading to self-care maintenance.    Hypertension  Yes takes medication     Intervention  Provide education on lifestyle modifcations including regular physical activity/exercise, weight management, moderate sodium restriction and increased consumption of fresh fruit, vegetables, and low fat dairy, alcohol moderation, and smoking cessation.;Monitor prescription use compliance.    Expected Outcomes  Short Term: Continued assessment and intervention until BP is < 140/66mm HG in hypertensive participants. < 130/81mm HG in hypertensive participants with diabetes, heart failure or chronic kidney disease.;Long Term: Maintenance of blood pressure at goal levels.       Core Components/Risk Factors/Patient Goals Review:    Core Components/Risk Factors/Patient Goals at Discharge (Final Review):    ITP Comments: ITP Comments    Row Name 04/18/17 1058 04/24/17 0835         ITP Comments  Medical Evaluation completed. Chart sent for review and changes to Dr. Emily Filbert Director of [place]. Diagnosis can be found in [facility] encounter 04/18/17  30 day review completed. ITP sent to Dr. Emily Filbert Director of [place]. Continue with ITP unless changes are made by physician.         Comments: 30 day review

## 2017-04-26 DIAGNOSIS — J432 Centrilobular emphysema: Secondary | ICD-10-CM | POA: Diagnosis not present

## 2017-04-26 DIAGNOSIS — I5022 Chronic systolic (congestive) heart failure: Secondary | ICD-10-CM | POA: Diagnosis not present

## 2017-04-26 NOTE — Progress Notes (Signed)
Daily Session Note  Patient Details  Name: Gwyndolyn Guilford MRN: [id number] Date of Birth: Mar 13, 1933 Referring Provider:     Pulmonary Rehab from 04/18/2017 in [facility] Cardiac and Pulmonary Rehab  Referring Provider  Tullo      Encounter Date: 04/26/2017  Check In: Session Check In - 04/26/17 1124      Check-In   Location  [facility]-Cardiac & Pulmonary Rehab    Staff Present  Alberteen Sam, MA, RCEP, CCRP, Exercise Physiologist;Quisha Mabie Sherryll Burger, RN BSN;Joseph Flavia Shipper    Supervising physician immediately available to respond to emergencies  [facility] immediately available ER MD    Physician(s)  Dr. Alfred Levins and Burlene Arnt    Medication changes reported      No    Fall or balance concerns reported     No    Warm-up and Cool-down  Performed as group-led instruction    Resistance Training Performed  Yes    VAD Patient?  No      Pain Assessment   Currently in Pain?  No/denies          Social History   Tobacco Use  Smoking Status Former Smoker  . Packs/day: 1.50  . Years: 29.00  . Pack years: 43.50  . Types: Cigarettes  . Last attempt to quit: 04/11/1988  . Years since quitting: 29.0  Smokeless Tobacco Never Used  Tobacco Comment   quit smoking in 1990    Goals Met:  Proper associated with RPD/PD & O2 Sat Independence with exercise equipment Using PLB without cueing & demonstrates good technique Exercise tolerated well Strength training completed today  Goals Unmet:  Not Applicable  Comments: First full day of exercise!  Patient was oriented to gym and equipment including functions, settings, policies, and procedures.  Patient's individual exercise prescription and treatment plan were reviewed.  All starting workloads were established based on the results of the 6 minute walk test done at initial orientation visit.  The plan for exercise progression was also introduced and progression will be customized based on patient's performance and goals.    Dr. Emily Filbert is  Medical Director for [place] and [facility].

## 2017-04-28 DIAGNOSIS — J432 Centrilobular emphysema: Secondary | ICD-10-CM | POA: Diagnosis not present

## 2017-04-28 DIAGNOSIS — I5022 Chronic systolic (congestive) heart failure: Secondary | ICD-10-CM | POA: Diagnosis not present

## 2017-04-28 NOTE — Progress Notes (Signed)
Daily Session Note  Patient Details  Name: Mary Howe MRN: [id number] Date of Birth: 10-01-1932 Referring Provider:     Pulmonary Rehab from 04/18/2017 in [facility] Cardiac and Pulmonary Rehab  Referring Provider  Tullo      Encounter Date: 04/28/2017  Check In: Session Check In - 04/28/17 1134      Check-In   Location  [facility]-Cardiac & Pulmonary Rehab    Staff Present  Alberteen Sam, MA, RCEP, CCRP, Exercise Physiologist;Joseph Alcus Dad, RN BSN    Supervising physician immediately available to respond to emergencies  [facility] immediately available ER MD    Physician(s)  Dr. Dia Crawford and [facility]    Medication changes reported      No    Fall or balance concerns reported     No    Warm-up and Cool-down  Performed as group-led instruction    Resistance Training Performed  Yes    VAD Patient?  No      Pain Assessment   Currently in Pain?  No/denies          Social History   Tobacco Use  Smoking Status Former Smoker  . Packs/day: 1.50  . Years: 29.00  . Pack years: 43.50  . Types: Cigarettes  . Last attempt to quit: 04/11/1988  . Years since quitting: 29.0  Smokeless Tobacco Never Used  Tobacco Comment   quit smoking in 1990    Goals Met:  Proper associated with RPD/PD & O2 Sat Independence with exercise equipment Using PLB without cueing & demonstrates good technique Exercise tolerated well Strength training completed today  Goals Unmet:  Not Applicable  Comments: Pt able to follow exercise prescription today without complaint.  Will continue to monitor for progression.    Dr. Emily Filbert is Medical Director for [place] and [facility].

## 2017-05-01 ENCOUNTER — Other Ambulatory Visit: Payer: Self-pay | Admitting: Internal Medicine

## 2017-05-01 DIAGNOSIS — J432 Centrilobular emphysema: Secondary | ICD-10-CM | POA: Diagnosis not present

## 2017-05-01 DIAGNOSIS — I5022 Chronic systolic (congestive) heart failure: Secondary | ICD-10-CM | POA: Diagnosis not present

## 2017-05-01 DIAGNOSIS — Z1231 Encounter for screening mammogram for malignant neoplasm of breast: Secondary | ICD-10-CM

## 2017-05-01 NOTE — Progress Notes (Signed)
Daily Session Note  Patient Details  Name: Mary Howe MRN: [id number] Date of Birth: 05-08-1932 Referring Provider:     Pulmonary Rehab from 04/18/2017 in [facility] Cardiac and Pulmonary Rehab  Referring Provider  Tullo      Encounter Date: 05/01/2017  Check In: Session Check In - 05/01/17 1123      Check-In   Location  [facility]iac [facility] Re[facility]    Staff Present  Nada Maclachlan, BA, ACSM CEP, Exercise Physiologist;Kelly Amedeo Plenty, BS, ACSM CEP, Exercise Physiologist;Shatasha Lambing Flavia Shipper    Supervising physician immediately available to respond to emergencies  [facility] immediately available ER MD    Physician(s)   Dr. Mable Paris and Cinda Quest    Medication changes reported      No    Fall or balance concerns reported     No    Warm-up and Cool-down  Performed as group-led instruction    Resistance Training Performed  Yes    VAD Patient?  No      Pain Assessment   Currently in Pain?  No/denies          Social History   Tobacco Use  Smoking Status Former Smoker  . Packs/day: 1.50  . Years: 29.00  . Pack years: 43.50  . Types: Cigarettes  . Last attempt to quit: 04/11/1988  . Years since quitting: 29.0  Smokeless Tobacco Never Used  Tobacco Comment   quit smoking in 1990    Goals Met:  Independence with exercise equipment Exercise tolerated well No report of cardiac concerns or symptoms Strength training completed today  Goals Unmet:  Not Applicable  Comments: Pt able to follow exercise prescription today without complaint.  Will continue to monitor for progression.   Dr. Emily Filbert is Medical Director for [name] and [facility].

## 2017-05-03 DIAGNOSIS — I5022 Chronic systolic (congestive) heart failure: Secondary | ICD-10-CM | POA: Diagnosis not present

## 2017-05-03 DIAGNOSIS — J432 Centrilobular emphysema: Secondary | ICD-10-CM | POA: Diagnosis not present

## 2017-05-03 NOTE — Progress Notes (Signed)
Daily Session Note  Patient Details  Name: Mary Howe MRN: [id number] Date of Birth: 02-08-1933 Referring Provider:     Pulmonary Rehab from 04/18/2017 in [facility] Cardiac and Pulmonary Rehab  Referring Provider  Tullo      Encounter Date: 05/03/2017  Check In: Session Check In - 05/03/17 1126      Check-In   Location  [facility]    Staff Present  Justin Mend RCP,RRT,BSRT;Carroll Enterkin, RN, Levie Heritage, MA, RCEP, CCRP, Exercise Physiologist    Supervising physician immediately available to respond to emergencies  [facility] immediately available ER MD    Physician(s)  Dr. Mariea Clonts and Jimmye Norman    Medication changes reported      No    Fall or balance concerns reported     No    Warm-up and Cool-down  Performed as group-led instruction    Resistance Training Performed  Yes    VAD Patient?  No      Pain Assessment   Currently in Pain?  No/denies          Social History   Tobacco Use  Smoking Status Former Smoker  . Packs/day: 1.50  . Years: 29.00  . Pack years: 43.50  . Types: Cigarettes  . Last attempt to quit: 04/11/1988  . Years since quitting: 29.0  Smokeless Tobacco Never Used  Tobacco Comment   quit smoking in 1990    Goals Met:  Independence with exercise equipment Exercise tolerated well No report of cardiac concerns or symptoms Strength training completed today  Goals Unmet:  Not Applicable  Comments: Pt able to follow exercise prescription today without complaint.  Will continue to monitor for progression.   Dr. Emily Filbert is Medical Director for [place] and [facility].

## 2017-05-05 DIAGNOSIS — I5022 Chronic systolic (congestive) heart failure: Secondary | ICD-10-CM | POA: Diagnosis not present

## 2017-05-05 DIAGNOSIS — J432 Centrilobular emphysema: Secondary | ICD-10-CM

## 2017-05-05 NOTE — Progress Notes (Signed)
Daily Session Note  Patient Details  Name: Mary Howe MRN: [id number] Date of Birth: Dec 18, 1932 Referring Provider:     Pulmonary Rehab from 04/18/2017 in [facility] Cardiac and Pulmonary Rehab  Referring Provider  Tullo      Encounter Date: 05/05/2017  Check In: Session Check In - 05/05/17 1117      Check-In   Location  [facility]    Staff Present  Joellyn Rued, BS, PEC;Arlissa Monteverde [place] physician immediately available to respond to emergencies  [facility] immediately available ER MD    Physician(s)  Dr. Mariea Clonts and Jacqualine Code    Medication changes reported      No    Fall or balance concerns reported     No    Warm-up and Cool-down  Performed as group-led instruction    Resistance Training Performed  Yes    VAD Patient?  No      Pain Assessment   Currently in Pain?  No/denies          Social History   Tobacco Use  Smoking Status Former Smoker  . Packs/day: 1.50  . Years: 29.00  . Pack years: 43.50  . Types: Cigarettes  . Last attempt to quit: 04/11/1988  . Years since quitting: 29.0  Smokeless Tobacco Never Used  Tobacco Comment   quit smoking in 1990    Goals Met:  Independence with exercise equipment Exercise tolerated well No report of cardiac concerns or symptoms Strength training completed today  Goals Unmet:  Not Applicable  Comments: Pt able to follow exercise prescription today without complaint.  Will continue to monitor for progression.   Dr. Emily Filbert is Medical Director for [place] and [facility].

## 2017-05-08 DIAGNOSIS — J432 Centrilobular emphysema: Secondary | ICD-10-CM | POA: Diagnosis not present

## 2017-05-08 DIAGNOSIS — I5022 Chronic systolic (congestive) heart failure: Secondary | ICD-10-CM | POA: Diagnosis not present

## 2017-05-08 NOTE — Progress Notes (Signed)
Daily Session Note  Patient Details  Name: Mary Howe MRN: [id number] Date of Birth: Jun 22, 1932 Referring Provider:     Pulmonary Rehab from 04/18/2017 in [facility] Cardiac and Pulmonary Rehab  Referring Provider  Tullo      Encounter Date: 05/08/2017  Check In: Session Check In - 05/08/17 1131      Check-In   Location  [facility]    Staff Present  Nada Maclachlan, BA, ACSM CEP, Exercise Physiologist;Kelly Amedeo Plenty, BS, ACSM CEP, Exercise Physiologist;Versie Soave [place] physician immediately available to respond to emergencies  [facility] immediately available ER MD    Physician(s)  Dr. Lamar Laundry and Mariea Clonts    Medication changes reported      No    Fall or balance concerns reported     No    Warm-up and Cool-down  Performed as group-led instruction    Resistance Training Performed  Yes    VAD Patient?  No      Pain Assessment   Currently in Pain?  No/denies          Social History   Tobacco Use  Smoking Status Former Smoker  . Packs/day: 1.50  . Years: 29.00  . Pack years: 43.50  . Types: Cigarettes  . Last attempt to quit: 04/11/1988  . Years since quitting: 29.0  Smokeless Tobacco Never Used  Tobacco Comment   quit smoking in 1990    Goals Met:  Independence with exercise equipment Exercise tolerated well No report of cardiac concerns or symptoms Strength training completed today  Goals Unmet:  Not Applicable  Comments: Pt able to follow exercise prescription today without complaint.  Will continue to monitor for progression.   Dr. Emily Filbert is Medical Director for [place] and [facility].

## 2017-05-10 DIAGNOSIS — I5022 Chronic systolic (congestive) heart failure: Secondary | ICD-10-CM | POA: Diagnosis not present

## 2017-05-10 DIAGNOSIS — J432 Centrilobular emphysema: Secondary | ICD-10-CM

## 2017-05-10 NOTE — Progress Notes (Signed)
Daily Session Note  Patient Details  Name: Mary Howe MRN: [id number] Date of Birth: March 10, 1933 Referring Provider:     Pulmonary Rehab from 04/18/2017 in [facility] Cardiac and Pulmonary Rehab  Referring Provider  Tullo      Encounter Date: 05/10/2017  Check In: Session Check In - 05/10/17 1126      Check-In   Location  [facility]    Staff Present  Alberteen Sam, MA, RCEP, CCRP, Exercise Physiologist;Meredith Sherryll Burger, RN BSN;Christinamarie Tall Flavia Shipper    Supervising physician immediately available to respond to emergencies  [facility] immediately available ER MD    Physician(s)   Dr. Jimmye Norman and Alfred Levins    Medication changes reported      No    Fall or balance concerns reported     No    Warm-up and Cool-down  Performed as group-led instruction    Resistance Training Performed  Yes    VAD Patient?  No      Pain Assessment   Currently in Pain?  No/denies          Social History   Tobacco Use  Smoking Status Former Smoker  . Packs/day: 1.50  . Years: 29.00  . Pack years: 43.50  . Types: Cigarettes  . Last attempt to quit: 04/11/1988  . Years since quitting: 29.0  Smokeless Tobacco Never Used  Tobacco Comment   quit smoking in 1990    Goals Met:  Independence with exercise equipment Exercise tolerated well No report of cardiac concerns or symptoms Strength training completed today  Goals Unmet:  Not Applicable  Comments: Pt able to follow exercise prescription today without complaint.  Will continue to monitor for progression. Reviewed home exercise with pt today.  Pt plans to walk at home in the house or outside for exercise.  Reviewed THR, pulse, RPE, sign and symptoms, and when to call 911 or MD.  Also discussed weather considerations and indoor options.  Pt voiced understanding.   Dr. Emily Filbert is Medical Director for [place] and [facility].

## 2017-05-12 ENCOUNTER — Encounter: Payer: Medicare Other | Attending: Internal Medicine

## 2017-05-12 DIAGNOSIS — J432 Centrilobular emphysema: Secondary | ICD-10-CM | POA: Diagnosis not present

## 2017-05-12 DIAGNOSIS — I5022 Chronic systolic (congestive) heart failure: Secondary | ICD-10-CM | POA: Diagnosis not present

## 2017-05-12 NOTE — Progress Notes (Signed)
Daily Session Note  Patient Details  Name: Mary Howe MRN: [id number] Date of Birth: May 29, 1932 Referring Provider:     Pulmonary Rehab from 04/18/2017 in [facility] Cardiac and Pulmonary Rehab  Referring Provider  Tullo      Encounter Date: 05/12/2017  Check In: Session Check In - 05/12/17 1152      Check-In   Location  [facility]    Staff Present  Renita Papa, RN BSN;Jessica Luan Pulling, MA, RCEP, CCRP, Exercise Physiologist;Theodis Kinsel Flavia Shipper    Supervising physician immediately available to respond to emergencies  [facility] immediately available ER MD    Physician(s)   Dr. Burlene Arnt and Clearnce Hasten    Medication changes reported      No    Fall or balance concerns reported     No    Warm-up and Cool-down  Performed as group-led instruction    Resistance Training Performed  Yes    VAD Patient?  No      Pain Assessment   Currently in Pain?  No/denies          Social History   Tobacco Use  Smoking Status Former Smoker  . Packs/day: 1.50  . Years: 29.00  . Pack years: 43.50  . Types: Cigarettes  . Last attempt to quit: 04/11/1988  . Years since quitting: 29.1  Smokeless Tobacco Never Used  Tobacco Comment   quit smoking in 1990    Goals Met:  Independence with exercise equipment Exercise tolerated well No report of cardiac concerns or symptoms Strength training completed today  Goals Unmet:  Not Applicable  Comments: Pt able to follow exercise prescription today without complaint.  Will continue to monitor for progression.   Dr. Emily Filbert is Medical Director for [place] and [facility].

## 2017-05-15 ENCOUNTER — Ambulatory Visit
Admission: RE | Admit: 2017-05-15 | Discharge: 2017-05-15 | Disposition: A | Discharge status: Home / Self Care | Payer: Medicare Other | Source: Ambulatory Visit | Attending: Internal Medicine | Admitting: Internal Medicine

## 2017-05-15 DIAGNOSIS — Z1231 Encounter for screening mammogram for malignant neoplasm of breast: Secondary | ICD-10-CM

## 2017-05-15 DIAGNOSIS — J432 Centrilobular emphysema: Secondary | ICD-10-CM

## 2017-05-15 DIAGNOSIS — I5022 Chronic systolic (congestive) heart failure: Secondary | ICD-10-CM | POA: Diagnosis not present

## 2017-05-15 NOTE — Progress Notes (Signed)
Daily Session Note  Patient Details  Name: Mary Howe MRN: [id number] Date of Birth: 11/05/1932 Referring Provider:     Pulmonary Rehab from 04/18/2017 in [facility] Cardiac and Pulmonary Rehab  Referring Provider  Tullo      Encounter Date: 05/15/2017  Check In: Session Check In - 05/15/17 1141      Check-In   Location  [facility] Re[facility]    Staff Present  Kelly Hayes, BS, ACSM CEP, Exercise Physiologist;Amanda Sommer, BA, ACSM CEP, Exercise Physiologist;Joseph Hood RCP,RRT,BSRT    Supervising physician immediately available to respond to emergencies  See telemetry face sheet for immediately available ER MD    Physician(s)  [place]inson and Williams    Medication changes reported      No    Fall or balance concerns reported     No    Warm-up and Cool-down  Performed as group-led instruction    Resistance Training Performed  Yes    VAD Patient?  No      Pain Assessment   Currently in Pain?  No/denies    Multiple Pain Sites  No          Social History   Tobacco Use  Smoking Status Former Smoker  . Packs/day: 1.50  . Years: 29.00  . Pack years: 43.50  . Types: Cigarettes  . Last attempt to quit: 04/11/1988  . Years since quitting: 29.1  Smokeless Tobacco Never Used  Tobacco Comment   quit smoking in 1990    Goals Met:  Independence with exercise equipment Exercise tolerated well No report of cardiac concerns or symptoms Strength training completed today  Goals Unmet:  Not Applicable  Comments: Pt able to follow exercise prescription today without complaint.  Will continue to monitor for progression.    Dr. Mark Miller is Medical Director for [facility] and [facility]. 

## 2017-05-17 ENCOUNTER — Encounter: Payer: Self-pay | Admitting: Internal Medicine

## 2017-05-17 DIAGNOSIS — J432 Centrilobular emphysema: Secondary | ICD-10-CM

## 2017-05-17 DIAGNOSIS — I5022 Chronic systolic (congestive) heart failure: Secondary | ICD-10-CM | POA: Diagnosis not present

## 2017-05-17 NOTE — Progress Notes (Signed)
Daily Session Note  Patient Details  Name: Mavis Gravelle MRN: [id number] Date of Birth: [birth year] Referring Provider:     Pulmonary Rehab from 04/18/2017 in [facility] Cardiac and Pulmonary Rehab  Referring Provider  Derrel Nip      Encounter Date: 05/17/2017  Check In: Session Check In - 05/17/17 1208      Check-In   Location  [facility]    Staff Present  Alberteen Sam, MA, RCEP, CCRP, Exercise Physiologist;Meredith Sherryll Burger, RN BSN;Joseph Flavia Shipper    Supervising physician immediately available to respond to emergencies  [facility] immediately available ER MD    Physician(s)  Drs. Jimmye Norman and [place]    Medication changes reported      No    Fall or balance concerns reported     No    Warm-up and Cool-down  Performed as group-led instruction    VAD Patient?  No      Pain Assessment   Currently in Pain?  No/denies          Social History   Tobacco Use  Smoking Status Former Smoker  . Packs/day: 1.50  . Years: 29.00  . Pack years: 43.50  . Types: Cigarettes  . Last attempt to quit: 04/11/1988  . Years since quitting: 29.1  Smokeless Tobacco Never Used  Tobacco Comment   quit smoking in 1990    Goals Met:  Proper associated with RPD/PD & O2 Sat Using PLB without cueing & demonstrates good technique Exercise tolerated well Personal goals reviewed No report of cardiac concerns or symptoms Strength training completed today  Goals Unmet:  Not Applicable  Comments: Pt able to follow exercise prescription today without complaint.  Will continue to monitor for progression.    Dr. Emily Filbert is Medical Director for [place] and [facility].

## 2017-05-18 ENCOUNTER — Ambulatory Visit: Payer: Medicare Other | Attending: Neurology

## 2017-05-18 DIAGNOSIS — F5101 Primary insomnia: Secondary | ICD-10-CM | POA: Insufficient documentation

## 2017-05-18 DIAGNOSIS — R0902 Hypoxemia: Secondary | ICD-10-CM | POA: Insufficient documentation

## 2017-05-18 DIAGNOSIS — R0683 Snoring: Secondary | ICD-10-CM | POA: Diagnosis not present

## 2017-05-18 DIAGNOSIS — R51 Headache: Secondary | ICD-10-CM | POA: Insufficient documentation

## 2017-05-18 DIAGNOSIS — G4730 Sleep apnea, unspecified: Secondary | ICD-10-CM | POA: Diagnosis not present

## 2017-05-18 DIAGNOSIS — J439 Emphysema, unspecified: Secondary | ICD-10-CM | POA: Diagnosis not present

## 2017-05-19 ENCOUNTER — Encounter: Payer: Medicare Other | Admitting: *Deleted

## 2017-05-19 DIAGNOSIS — J432 Centrilobular emphysema: Secondary | ICD-10-CM | POA: Diagnosis not present

## 2017-05-19 DIAGNOSIS — I5022 Chronic systolic (congestive) heart failure: Secondary | ICD-10-CM | POA: Diagnosis not present

## 2017-05-19 NOTE — Progress Notes (Signed)
Daily Session Note  Patient Details  Name: Clark Clowdus MRN: [id number] Date of Birth: [birth year] Referring Provider:     Pulmonary Rehab from 04/18/2017 in [facility] Cardiac and Pulmonary Rehab  Referring Provider  Derrel Nip      Encounter Date: 05/19/2017  Check In: Session Check In - 05/19/17 1120      Check-In   Location  [facility]    Staff Present  Alberteen Sam, MA, RCEP, CCRP, Exercise Physiologist;Meredith Sherryll Burger, RN BSN;Joseph Flavia Shipper    Supervising physician immediately available to respond to emergencies  [facility] immediately available ER MD    Physician(s)  Drs. Lord and [place]    Medication changes reported      No    Fall or balance concerns reported     No    Warm-up and Cool-down  Performed as group-led Higher education careers adviser Performed  Yes    VAD Patient?  No      Pain Assessment   Currently in Pain?  No/denies          Social History   Tobacco Use  Smoking Status Former Smoker  . Packs/day: 1.50  . Years: 29.00  . Pack years: 43.50  . Types: Cigarettes  . Last attempt to quit: 04/11/1988  . Years since quitting: 29.1  Smokeless Tobacco Never Used  Tobacco Comment   quit smoking in 1990    Goals Met:  Proper associated with RPD/PD & O2 Sat Independence with exercise equipment Using PLB without cueing & demonstrates good technique Exercise tolerated well No report of cardiac concerns or symptoms Strength training completed today  Goals Unmet:  Not Applicable  Comments: Pt able to follow exercise prescription today without complaint.  Will continue to monitor for progression.    Dr. Emily Filbert is Medical Director for [place] and [facility].

## 2017-05-22 ENCOUNTER — Telehealth: Payer: Self-pay | Admitting: Internal Medicine

## 2017-05-22 ENCOUNTER — Encounter: Payer: Self-pay | Admitting: Internal Medicine

## 2017-05-22 ENCOUNTER — Ambulatory Visit (INDEPENDENT_AMBULATORY_CARE_PROVIDER_SITE_OTHER): Payer: Medicare Other | Admitting: Internal Medicine

## 2017-05-22 VITALS — BP 120/68 | HR 65 | Temp 97.5°F | Resp 15 | Ht 64.0 in | Wt 129.8 lb

## 2017-05-22 DIAGNOSIS — I5022 Chronic systolic (congestive) heart failure: Secondary | ICD-10-CM | POA: Diagnosis not present

## 2017-05-22 DIAGNOSIS — J431 Panlobular emphysema: Secondary | ICD-10-CM

## 2017-05-22 DIAGNOSIS — E559 Vitamin D deficiency, unspecified: Secondary | ICD-10-CM | POA: Diagnosis not present

## 2017-05-22 DIAGNOSIS — G4734 Idiopathic sleep related nonobstructive alveolar hypoventilation: Secondary | ICD-10-CM | POA: Diagnosis not present

## 2017-05-22 DIAGNOSIS — R7303 Prediabetes: Secondary | ICD-10-CM | POA: Diagnosis not present

## 2017-05-22 DIAGNOSIS — E785 Hyperlipidemia, unspecified: Secondary | ICD-10-CM

## 2017-05-22 DIAGNOSIS — E119 Type 2 diabetes mellitus without complications: Secondary | ICD-10-CM

## 2017-05-22 DIAGNOSIS — I428 Other cardiomyopathies: Secondary | ICD-10-CM | POA: Insufficient documentation

## 2017-05-22 DIAGNOSIS — E538 Deficiency of other specified B group vitamins: Secondary | ICD-10-CM

## 2017-05-22 DIAGNOSIS — E7800 Pure hypercholesterolemia, unspecified: Secondary | ICD-10-CM

## 2017-05-22 DIAGNOSIS — J432 Centrilobular emphysema: Secondary | ICD-10-CM

## 2017-05-22 DIAGNOSIS — I10 Essential (primary) hypertension: Secondary | ICD-10-CM | POA: Diagnosis not present

## 2017-05-22 LAB — COMPREHENSIVE METABOLIC PANEL
ALK PHOS: 48 U/L (ref 39–117)
ALT: 16 U/L (ref 0–35)
AST: 23 U/L (ref 0–37)
Albumin: 4.2 g/dL (ref 3.5–5.2)
BILIRUBIN TOTAL: 0.5 mg/dL (ref 0.2–1.2)
BUN: 13 mg/dL (ref 6–23)
CO2: 28 meq/L (ref 19–32)
Calcium: 9.3 mg/dL (ref 8.4–10.5)
Chloride: 100 mEq/L (ref 96–112)
Creatinine, Ser: 0.61 mg/dL (ref 0.40–1.20)
GFR: 99.63 mL/min (ref >60.00)
GLUCOSE: 110 mg/dL — AB (ref 70–99)
Potassium: 5.2 mEq/L — ABNORMAL HIGH (ref 3.5–5.1)
Sodium: 136 mEq/L (ref 135–145)
TOTAL PROTEIN: 7.1 g/dL (ref 6.0–8.3)

## 2017-05-22 LAB — VITAMIN B12: Vitamin B-12: 481 pg/mL (ref 211–911)

## 2017-05-22 LAB — LIPID PANEL
CHOL/HDL RATIO: 2
Cholesterol: 207 mg/dL — ABNORMAL HIGH (ref 0–200)
HDL: 87.4 mg/dL (ref >39.00)
LDL Cholesterol: 102 mg/dL — ABNORMAL HIGH (ref 0–99)
NONHDL: 119.93
TRIGLYCERIDES: 91 mg/dL (ref 0.0–149.0)
VLDL: 18.2 mg/dL (ref 0.0–40.0)

## 2017-05-22 LAB — VITAMIN D 25 HYDROXY (VIT D DEFICIENCY, FRACTURES): VITD: 35.88 ng/mL (ref 30.00–100.00)

## 2017-05-22 MED ORDER — ALENDRONATE SODIUM 70 MG PO TABS: 70.0000 mg | ORAL_TABLET | ORAL | 11 refills | Status: DC

## 2017-05-22 MED ORDER — ZOSTER VAC RECOMB ADJUVANTED 50 MCG/0.5ML IM SUSR: 0.5000 mL | Freq: Once | INTRAMUSCULAR | 1 refills | Status: AC

## 2017-05-22 MED ORDER — BUDESONIDE 0.5 MG/2ML IN SUSP: 0.5000 mg | Freq: Two times a day (BID) | RESPIRATORY_TRACT | 11 refills | Status: DC

## 2017-05-22 MED ORDER — BISOPROLOL FUMARATE 5 MG PO TABS: 2.5000 mg | ORAL_TABLET | Freq: Every day | ORAL | 1 refills | Status: DC

## 2017-05-22 NOTE — Progress Notes (Signed)
Pulmonary Individual Treatment Plan  Patient Details  Name: Mary Howe MRN: [id number] Date of Birth: January 01, 1933 Referring Provider:     Pulmonary Rehab from 04/18/2017 in [facility] Cardiac and Pulmonary Rehab  Referring Provider  Derrel Nip      Initial Encounter Date:    Pulmonary Rehab from 04/18/2017 in [facility] Cardiac and Pulmonary Rehab  Date  04/18/17  Referring Provider  Derrel Nip      Visit Diagnosis: Centrilobular emphysema ([place])  Patient's Home Medications on Admission:  Current Outpatient Medications:  .  acetaminophen (TYLENOL) 500 MG tablet, Take 500 mg by mouth every 6 (six) hours as needed., Disp: , Rfl:  .  acyclovir (ZOVIRAX) 400 MG tablet, Take 1 tablet (400 mg total) by mouth 5 (five) times daily., Disp: 35 tablet, Rfl: 0 .  albuterol (PROVENTIL HFA;VENTOLIN HFA) 108 (90 Base) MCG/ACT inhaler, Inhale 1-2 puffs into the lungs every 4 (four) hours as needed for wheezing or shortness of breath., Disp: 1 Inhaler, Rfl: 10 .  alendronate (FOSAMAX) 70 MG tablet, Take 1 tablet (70 mg total) by mouth every 7 (seven) days. Take with a full glass of water on an empty stomach., Disp: 4 tablet, Rfl: 11 .  benzocaine (ORAJEL) 10 % mucosal gel, Use as directed in the mouth or throat 3 (three) times daily as needed for mouth pain., Disp: 5.3 g, Rfl: 0 .  bisoprolol (ZEBETA) 5 MG tablet, Take 0.5 tablets (2.5 mg total) by mouth daily., Disp: 45 tablet, Rfl: 1 .  budesonide (PULMICORT) 0.5 MG/2ML nebulizer solution, Take 2 mLs (0.5 mg total) by nebulization 2 (two) times daily., Disp: 120 mL, Rfl: 11 .  calcium carbonate (OS-CAL) 600 MG TABS tablet, Take 600 mg by mouth daily with breakfast. , Disp: , Rfl:  .  cetirizine (ZYRTEC) 10 MG tablet, Take 10 mg by mouth daily., Disp: , Rfl:  .  diphenhydrAMINE (BENADRYL) 25 MG tablet, Take 25 mg by mouth at bedtime as needed., Disp: , Rfl:  .  escitalopram (LEXAPRO) 10 MG tablet, TAKE 1 TABLET(10 MG) BY MOUTH DAILY, Disp: 30 tablet, Rfl: 5 .  furosemide  (LASIX) 20 MG tablet, Take 1 tablet (20 mg total) by mouth 2 (two) times daily., Disp: 60 tablet, Rfl: 11 .  guaiFENesin (ROBITUSSIN) 100 MG/5ML SOLN, Take 10 mLs (200 mg total) by mouth every 4 (four) hours as needed for cough or to loosen phlegm., Disp: 1200 mL, Rfl: 0 .  lidocaine (LIDODERM) 5 %, Place 1 patch onto the skin daily. Remove & Discard patch within 12 hours or as directed by MD, Disp: 30 patch, Rfl: 0 .  MULTIPLE VITAMIN PO, Take 1 tablet by mouth daily. , Disp: , Rfl:  .  OXYGEN, Inhale 2.5 L daily into the lungs., Disp: , Rfl:  .  pantoprazole (PROTONIX) 40 MG tablet, TAKE 1 TABLET(40 MG) BY MOUTH DAILY, Disp: 90 tablet, Rfl: 0 .  valACYclovir (VALTREX) 1000 MG tablet, Take 1,000 mg by mouth 2 (two) times daily. , Disp: , Rfl:  .  Zoster Vaccine Adjuvanted [facility]) injection, Inject 0.5 mLs into the muscle once for 1 dose., Disp: 1 each, Rfl: 1  Past Medical History: Past Medical History:  Diagnosis Date  . COPD (chronic obstructive pulmonary disease) ([place])   . Diverticulitis 2015  . Essential hypertension   . GERD (gastroesophageal reflux disease)   . Hyperlipidemia   . PAH (pulmonary artery hypertension) ([place])    a. 01/2017 Echo: PASP 92mHg; b. 01/2017 RHC: mild PAH.  . Takotsubo cardiomyopathy  a. 01/2017 Echo: EF 25-30%, sev glob HK w/ basal regions moving well. Gr1 DD. mild MR, mildly dil LA, nl RV fxn, mild to mod TR, PASP 33mHg; b. 01/2017 Cath: Nl cors w/ EF 35-45%. HK of distal segments suggestive of Takotsubo CM.   .Marland KitchenThyroid disease     Tobacco Use: Social History   Tobacco Use  Smoking Status Former Smoker  . Packs/day: 1.50  . Years: 29.00  . Pack years: 43.50  . Types: Cigarettes  . Last attempt to quit: 04/11/1988  . Years since quitting: 29.1  Smokeless Tobacco Never Used  Tobacco Comment   quit smoking in 1990    Labs: Recent Review Flowsheet Data    Labs for ITP Cardiac and Pulmonary Rehab Latest Ref Rng & Units 07/01/2016 11/10/2016  12/14/2016 01/11/2017 01/11/2017   Cholestrol 0 - 200 mg/dL 186 151 188 - -   LDLCALC 0 - 99 mg/dL 84 46 84 - -   LDLDIRECT mg/dL 89.0 - - - -   HDL >39.00 mg/dL 81.90 93.80 88.80 - -   Trlycerides 0.0 - 149.0 mg/dL 99.0 56.0 79.0 - -   Hemoglobin A1c 4.6 - 6.5 % 6.4 6.1 - - -   PHART 7.350 - 7.450 - - - - 7.21(L)   PCO2ART 32.0 - 48.0 mmHg - - - - 48   HCO3 20.0 - 28.0 mmol/L - - - 25.3 19.2(L)   ACIDBASEDEF 0.0 - 2.0 mmol/L - - - 10.5(H) 8.8(H)   O2SAT % - - - 84.6 99.0       Pulmonary Assessment Scores: Pulmonary Assessment Scores    Row Name 04/18/17 1122         ADL UCSD   ADL Phase  Entry     SOB Score total  13     Rest  1     Walk  0     Stairs  1     Bath  0     Dress  0     Shop  0       CAT Score   CAT Score  14       mMRC Score   mMRC Score  1        Pulmonary Function Assessment: Pulmonary Function Assessment - 04/18/17 1124      Breath   Bilateral Breath Sounds  Clear    Shortness of Breath  No       Exercise Target Goals:    Exercise Program Goal: Individual exercise prescription set using results from initial 6 min walk test and THRR while considering  patient's activity barriers and safety.    Exercise Prescription Goal: Initial exercise prescription builds to 30-45 minutes a day of aerobic activity, 2-3 days per week.  Home exercise guidelines will be given to patient during program as part of exercise prescription that the participant will acknowledge.  Activity Barriers & Risk Stratification:   6 Minute Walk: 6 Minute Walk    Row Name 04/18/17 1227         6 Minute Walk   Distance  1040 feet     Walk Time  6 minutes     # of Rest Breaks  0     MPH  1.97     METS  1.98     RPE  10     Perceived Dyspnea   1     VO2 Peak  6.94     Symptoms  No  Resting HR  78 bpm     Resting BP  134/64     Resting Oxygen Saturation   95 %     Exercise Oxygen Saturation  during 6 min walk  89 %     Max Ex. HR  96 bpm     Max Ex. BP   130/60     2 Minute Post BP  120/60       Interval HR   1 Minute HR  82     2 Minute HR  79     4 Minute HR  86     5 Minute HR  92     6 Minute HR  96     2 Minute Post HR  74     Interval Heart Rate?  Yes       Interval Oxygen   Interval Oxygen?  Yes     Baseline Oxygen Saturation %  95 %     1 Minute Oxygen Saturation %  94 %     1 Minute Liters of Oxygen  0 L     2 Minute Oxygen Saturation %  89 %     2 Minute Liters of Oxygen  0 L     3 Minute Liters of Oxygen  0 L     4 Minute Oxygen Saturation %  94 %     4 Minute Liters of Oxygen  0 L     5 Minute Oxygen Saturation %  93 %     5 Minute Liters of Oxygen  0 L     6 Minute Oxygen Saturation %  91 %     6 Minute Liters of Oxygen  0 L     2 Minute Post Oxygen Saturation %  95 %     2 Minute Post Liters of Oxygen  0 L       Oxygen Initial Assessment: Oxygen Initial Assessment - 04/18/17 1126      Home Oxygen   Home Oxygen Device  Home Concentrator;E-Tanks    Sleep Oxygen Prescription  Continuous    Liters per minute  2.5    Home Exercise Oxygen Prescription  Continuous she uses only when things are strenuous    Liters per minute  2.5    Home at Rest Exercise Oxygen Prescription  Continuous    Liters per minute  2.5    Compliance with Home Oxygen Use  Yes She does not use her oxygen all the time at home. Her oxygen saturations have been better. When she uses exertion she uses oxygen      Initial 6 min Walk   Oxygen Used  None      Program Oxygen Prescription   Program Oxygen Prescription  None      Intervention   Short Term Goals  To learn and understand importance of maintaining oxygen saturations>88%;To learn and demonstrate proper pursed lip breathing techniques or other breathing techniques.;To learn and understand importance of monitoring SPO2 with pulse oximeter and demonstrate accurate use of the pulse oximeter.;To learn and exhibit compliance with exercise, home and travel O2 prescription;To learn and  demonstrate proper use of respiratory medications    Long  Term Goals  Exhibits compliance with exercise, home and travel O2 prescription;Verbalizes importance of monitoring SPO2 with pulse oximeter and return demonstration;Maintenance of O2 saturations>88%;Exhibits proper breathing techniques, such as pursed lip breathing or other method taught during program session;Compliance with respiratory medication;Demonstrates proper use of MDI's  Oxygen Re-Evaluation: Oxygen Re-Evaluation    Row Name 04/26/17 1128             Goals/Expected Outcomes   Short Term Goals  To learn and demonstrate proper pursed lip breathing techniques or other breathing techniques.;To learn and understand importance of maintaining oxygen saturations>88%       Long  Term Goals  Maintenance of O2 saturations>88%;Exhibits proper breathing techniques, such as pursed lip breathing or other method taught during program session       Comments  Reviewed PLB technique with pt.  Talked about how it work and it's important to maintaining his exercise saturations.         Goals/Expected Outcomes  Short: Become more profiecient at using PLB.   Long: Become independent at using PLB.          Oxygen Discharge (Final Oxygen Re-Evaluation): Oxygen Re-Evaluation - 04/26/17 1128      Goals/Expected Outcomes   Short Term Goals  To learn and demonstrate proper pursed lip breathing techniques or other breathing techniques.;To learn and understand importance of maintaining oxygen saturations>88%    Long  Term Goals  Maintenance of O2 saturations>88%;Exhibits proper breathing techniques, such as pursed lip breathing or other method taught during program session    Comments  Reviewed PLB technique with pt.  Talked about how it work and it's important to maintaining his exercise saturations.      Goals/Expected Outcomes  Short: Become more profiecient at using PLB.   Long: Become independent at using PLB.       Initial Exercise  Prescription: Initial Exercise Prescription - 04/18/17 1200      Date of Initial Exercise RX and Referring Provider   Date  04/18/17    Referring Provider  Tullo      Treadmill   MPH  1.8    Grade  0    Minutes  15    METs  2      NuStep   Level  2    SPM  80    Minutes  15    METs  2      REL-XR   Level  2    Speed  50    Minutes  15    METs  2      Prescription Details   Frequency (times per week)  3    Duration  Progress to 45 minutes of aerobic exercise without signs/symptoms of physical distress      Intensity   THRR 40-80% of Max Heartrate  102-126    Ratings of Perceived Exertion  11-13    Perceived Dyspnea  0-4      Resistance Training   Training Prescription  Yes    Weight  2 lb    Reps  10-15       Perform Capillary Blood Glucose checks as needed.  Exercise Prescription Changes: Exercise Prescription Changes    Row Name 05/10/17 1500             Response to Exercise   Blood Pressure (Admit)  124/62       Blood Pressure (Exit)  126/64       Heart Rate (Admit)  74 bpm       Heart Rate (Exercise)  95 bpm       Heart Rate (Exit)  79 bpm       Oxygen Saturation (Admit)  96 %       Oxygen Saturation (Exercise)  90 %  Oxygen Saturation (Exit)  93 %       Rating of Perceived Exertion (Exercise)  12       Perceived Dyspnea (Exercise)  1       Symptoms  none       Duration  Continue with 45 min of aerobic exercise without signs/symptoms of physical distress.       Intensity  THRR unchanged         Progression   Progression  Continue to progress workloads to maintain intensity without signs/symptoms of physical distress.       Average METs  3.95         Resistance Training   Training Prescription  Yes       Weight  3 lbs       Reps  10-15         Interval Training   Interval Training  No         Treadmill   MPH  2.3       Grade  0       Minutes  15       METs  2.76         NuStep   Level  2       SPM  87       Minutes  15        METs  3.5         REL-XR   Level  1       Speed  65       Minutes  15       METs  5.6         Home Exercise Plan   Plans to continue exercise at  Home (comment) walking       Frequency  Add 1 additional day to program exercise sessions.       Initial Home Exercises Provided  05/10/17          Exercise Comments: Exercise Comments    Row Name 04/26/17 1128           Exercise Comments  First full day of exercise!  Patient was oriented to gym and equipment including functions, settings, policies, and procedures.  Patient's individual exercise prescription and treatment plan were reviewed.  All starting workloads were established based on the results of the 6 minute walk test done at initial orientation visit.  The plan for exercise progression was also introduced and progression will be customized based on patient's performance and goals.          Exercise Goals and Review: Exercise Goals    Row Name 04/18/17 1227             Exercise Goals   Increase Physical Activity  Yes       Intervention  Provide advice, education, support and counseling about physical activity/exercise needs.;Develop an individualized exercise prescription for aerobic and resistive training based on initial evaluation findings, risk stratification, comorbidities and participant's personal goals.       Expected Outcomes  Achievement of increased cardiorespiratory fitness and enhanced flexibility, muscular endurance and strength shown through measurements of functional capacity and personal statement of participant.       Increase Strength and Stamina  Yes       Intervention  Provide advice, education, support and counseling about physical activity/exercise needs.;Develop an individualized exercise prescription for aerobic and resistive training based on initial evaluation findings, risk stratification, comorbidities and participant's personal goals.  Expected Outcomes  Achievement of increased  cardiorespiratory fitness and enhanced flexibility, muscular endurance and strength shown through measurements of functional capacity and personal statement of participant.       Able to understand and use rate of perceived exertion (RPE) scale  Yes       Intervention  Provide education and explanation on how to use RPE scale       Expected Outcomes  Short Term: Able to use RPE daily in rehab to express subjective intensity level;Long Term:  Able to use RPE to guide intensity level when exercising independently       Able to understand and use Dyspnea scale  Yes       Intervention  Provide education and explanation on how to use Dyspnea scale       Expected Outcomes  Short Term: Able to use Dyspnea scale daily in rehab to express subjective sense of shortness of breath during exertion;Long Term: Able to use Dyspnea scale to guide intensity level when exercising independently       Knowledge and understanding of Target Heart Rate Range (THRR)  Yes       Intervention  Provide education and explanation of THRR including how the numbers were predicted and where they are located for reference       Expected Outcomes  Short Term: Able to state/look up THRR;Long Term: Able to use THRR to govern intensity when exercising independently;Short Term: Able to use daily as guideline for intensity in rehab       Able to check pulse independently  Yes       Intervention  Provide education and demonstration on how to check pulse in carotid and radial arteries.;Review the importance of being able to check your own pulse for safety during independent exercise       Expected Outcomes  Short Term: Able to explain why pulse checking is important during independent exercise;Long Term: Able to check pulse independently and accurately       Understanding of Exercise Prescription  Yes       Intervention  Provide education, explanation, and written materials on patient's individual exercise prescription       Expected Outcomes   Short Term: Able to explain program exercise prescription;Long Term: Able to explain home exercise prescription to exercise independently          Exercise Goals Re-Evaluation : Exercise Goals Re-Evaluation    Row Name 04/26/17 1127 05/10/17 1232           Exercise Goal Re-Evaluation   Exercise Goals Review  Able to understand and use rate of perceived exertion (RPE) scale;Knowledge and understanding of Target Heart Rate Range (THRR);Able to understand and use Dyspnea scale;Understanding of Exercise Prescription  Increase Physical Activity;Able to understand and use Dyspnea scale;Understanding of Exercise Prescription;Increase Strength and Stamina;Knowledge and understanding of Target Heart Rate Range (THRR);Able to understand and use rate of perceived exertion (RPE) scale;Able to check pulse independently      Comments  Reviewed RPE scale, THR and program prescription with pt today.  Pt voiced understanding and was given a copy of goals to take home.   Zalea is off to a good start in rehab. She is starting to notice an increase in her strength and stamina.  We talked about adding in home exercise to help.  Reviewed home exercise with pt today.  Pt plans to walk at home for exercise.  Reviewed THR, pulse, RPE, sign and symptoms, and when to call 911 or  MD.  Also discussed weather considerations and indoor options.  Pt voiced understanding.      Expected Outcomes  Short: Use RPE daily to regulate intensity.  Long: Follow program prescription in THR.  Short: Add in at least one extra day of walking at home.  Long: Continue to increase physical activity         Discharge Exercise Prescription (Final Exercise Prescription Changes): Exercise Prescription Changes - 05/10/17 1500      Response to Exercise   Blood Pressure (Admit)  124/62    Blood Pressure (Exit)  126/64    Heart Rate (Admit)  74 bpm    Heart Rate (Exercise)  95 bpm    Heart Rate (Exit)  79 bpm    Oxygen Saturation (Admit)  96 %     Oxygen Saturation (Exercise)  90 %    Oxygen Saturation (Exit)  93 %    Rating of Perceived Exertion (Exercise)  12    Perceived Dyspnea (Exercise)  1    Symptoms  none    Duration  Continue with 45 min of aerobic exercise without signs/symptoms of physical distress.    Intensity  THRR unchanged      Progression   Progression  Continue to progress workloads to maintain intensity without signs/symptoms of physical distress.    Average METs  3.95      Resistance Training   Training Prescription  Yes    Weight  3 lbs    Reps  10-15      Interval Training   Interval Training  No      Treadmill   MPH  2.3    Grade  0    Minutes  15    METs  2.76      NuStep   Level  2    SPM  87    Minutes  15    METs  3.5      REL-XR   Level  1    Speed  65    Minutes  15    METs  5.6      Home Exercise Plan   Plans to continue exercise at  Home (comment) walking    Frequency  Add 1 additional day to program exercise sessions.    Initial Home Exercises Provided  05/10/17       Nutrition:  Target Goals: Understanding of nutrition guidelines, daily intake of sodium <157m, cholesterol <2084m calories 30% from fat and 7% or less from saturated fats, daily to have 5 or more servings of fruits and vegetables.  Biometrics: Pre Biometrics - 04/18/17 1226      Pre Biometrics   Height  '5\' 4"'  (1.626 m)    Weight  127 lb 9.6 oz (57.9 kg)    Waist Circumference  29.75 inches    Hip Circumference  39 inches    Waist to Hip Ratio  0.76 %    BMI (Calculated)  21.89        Nutrition Therapy Plan and Nutrition Goals: Nutrition Therapy & Goals - 04/18/17 1121      Personal Nutrition Goals   Comments  She plans to meet with the dietician. She states she eats pretty well and watches what she eats.       Intervention Plan   Intervention  Nutrition handout(s) given to patient.;Prescribe, educate and counsel regarding individualized specific dietary modifications aiming towards  targeted core components such as weight, hypertension, lipid management, diabetes, heart failure and other comorbidities.  Expected Outcomes  Short Term Goal: Understand basic principles of dietary content, such as calories, fat, sodium, cholesterol and nutrients.;Long Term Goal: Adherence to prescribed nutrition plan.       Nutrition Assessments: Nutrition Assessments - 05/08/17 1518      MEDFICTS Scores   Pre Score  27       Nutrition Goals Re-Evaluation: Nutrition Goals Re-Evaluation    [place] Name 05/17/17 1213             Goals   Nutrition Goal  Meet with dietician to make sure she is eating enough and eating well.        Comment  Jack has not yet met with dietician.  She was waiting to see if her daughter wanted to go the appointment with her, but she forgets to ask.  Appointment scheduled for 2/13 during class.        Expected Outcome  Short: Meet with dietician.  Long: Follow recommendations outlined by dietician.          Nutrition Goals Discharge (Final Nutrition Goals Re-Evaluation): Nutrition Goals Re-Evaluation - 05/17/17 1213      Goals   Nutrition Goal  Meet with dietician to make sure she is eating enough and eating well.     Comment  Deliana has not yet met with dietician.  She was waiting to see if her daughter wanted to go the appointment with her, but she forgets to ask.  Appointment scheduled for 2/13 during class.     Expected Outcome  Short: Meet with dietician.  Long: Follow recommendations outlined by dietician.       Psychosocial: Target Goals: Acknowledge presence or absence of significant depression and/or stress, maximize coping skills, provide positive support system. Participant is able to verbalize types and ability to use techniques and skills needed for reducing stress and depression.   Initial Review & Psychosocial Screening: Initial Psych Review & Screening - 04/18/17 1116      Initial Review   Current issues with  Current Stress  Concerns    Source of Stress Concerns  Unable to perform yard/household activities    Comments  Under some stress. Her sister that she lives with is 24 and sometimes she gets on her nerves. She does not get short of breath that often but sometimes she gets short of breath when she is grocery shopping. She moved from [place] in 2014.       Family Dynamics   Good Support System?  Yes    Comments  Her daughter helps her when she needs it.       Barriers   Psychosocial barriers to participate in program  The patient should benefit from training in stress management and relaxation.      Screening Interventions   Interventions  Yes;Encouraged to exercise;Program counselor consult;Provide feedback about the scores to participant;To provide support and resources with identified psychosocial needs    Expected Outcomes  Short Term goal: Utilizing psychosocial counselor, staff and physician to assist with identification of specific Stressors or current issues interfering with healing process. Setting desired goal for each stressor or current issue identified.;Long Term Goal: Stressors or current issues are controlled or eliminated.;Short Term goal: Identification and review with participant of any Quality of Life or Depression concerns found by scoring the questionnaire.;Long Term goal: The participant improves quality of Life and PHQ9 Scores as seen by post scores and/or verbalization of changes       Quality of Life Scores:  Scores of 19  and below usually indicate a poorer quality of life in these areas.  A difference of  2-3 points is a clinically meaningful difference.  A difference of 2-3 points in the total score of the Quality of Life Index has been associated with significant improvement in overall quality of life, self-image, physical symptoms, and general health in studies assessing change in quality of life.  PHQ-9: Recent Review Flowsheet Data    Depression screen [facility] 2/9 04/18/2017 09/20/2016  06/22/2016 02/23/2015   Decreased Interest 0 0 0 0   Down, Depressed, Hopeless 0 0 0 0   PHQ - 2 Score 0 0 0 0   Altered sleeping 0 - - -   Tired, decreased energy 0 - - -   Change in appetite 0 - - -   Feeling bad or failure about yourself  0 - - -   Trouble concentrating 0 - - -   Moving slowly or fidgety/restless 0 - - -   Suicidal thoughts 0 - - -   PHQ-9 Score 0 - - -   Difficult doing work/chores Not difficult at all - - -     Interpretation of Total Score  Total Score Depression Severity:  1-4 = Minimal depression, 5-9 = Mild depression, 10-14 = Moderate depression, 15-19 = Moderately severe depression, 20-27 = Severe depression   Psychosocial Evaluation and Intervention: Psychosocial Evaluation - 05/17/17 1223      Psychosocial Evaluation & Interventions   Interventions  Encouraged to exercise with the program and follow exercise prescription;Stress management education;Relaxation education    Comments  Counselor met with Ms. Fuster [facility]) today for initial psychosocial evaluation.  She is an 84 year old who has emphysema. She has a strong support system with a daughter close by; lives with her older sister; and is actively involved in her local church.  Teruko states she sleeps well but has a sleep study scheduled soon since she uses 0xygen at night.  She has a fair appetite and denies a history of depression or anxiety or any current symptoms.  Zemira states she is typically in a positive mood and has minimal stress in her life - other than her health and challenges living with her old sister at times.  She has goals to breathe better and improve her overall quality of life while in this program.  Staff will follow with her.     Expected Outcomes  Sherrine will benefit from consistent exercise to achieve her stated goals.  The educational and psychoeducational parts of this program will be helpful in understanding her condition and coping more positively in general.       Continue Psychosocial Services   Follow up required by staff       Psychosocial Re-Evaluation:   Psychosocial Discharge (Final Psychosocial Re-Evaluation):   Education: Education Goals: Education classes will be provided on a weekly basis, covering required topics. Participant will state understanding/return demonstration of topics presented.  Learning Barriers/Preferences: Learning Barriers/Preferences - 04/18/17 1126      Learning Barriers/Preferences   Learning Barriers  Sight wears glasses    Learning Preferences  None       Education Topics:  Initial Evaluation Education: - Verbal, written and demonstration of respiratory meds, oximetry and breathing techniques. Instruction on use of nebulizers and MDIs and importance of monitoring MDI activations.   Pulmonary Rehab from 05/15/2017 in [facility] Cardiac and Pulmonary Rehab  Date  04/18/17  Educator  [facility]  Instruction Review Code  1- Verbalizes Understanding  General Nutrition Guidelines/Fats and Fiber: -Group instruction provided by verbal, written material, models and posters to present the general guidelines for heart healthy nutrition. Gives an explanation and review of dietary fats and fiber.   Pulmonary Rehab from 05/15/2017 in [facility] Cardiac and Pulmonary Rehab  Date  05/08/17  Educator  CR  Instruction Review Code  1- Verbalizes Understanding      Controlling Sodium/Reading Food Labels: -Group verbal and written material supporting the discussion of sodium use in heart healthy nutrition. Review and explanation with models, verbal and written materials for utilization of the food label.   Pulmonary Rehab from 05/15/2017 in [facility] Cardiac and Pulmonary Rehab  Date  05/15/17  Educator  CR  Instruction Review Code  1- Verbalizes Understanding      Exercise Physiology & General Exercise Guidelines: - Group verbal and written instruction with models to review the exercise physiology of the cardiovascular system and  associated critical values. Provides general exercise guidelines with specific guidelines to those with heart or lung disease.    Aerobic Exercise & Resistance Training: - Gives group verbal and written instruction on the various components of exercise. Focuses on aerobic and resistive training programs and the benefits of this training and how to safely progress through these programs.   Pulmonary Rehab from 05/15/2017 in [facility] Cardiac and Pulmonary Rehab  Date  04/28/17  Educator  [facility]  Instruction Review Code  1- Verbalizes Understanding      Flexibility, Balance, Mind/Body Relaxation: Provides group verbal/written instruction on the benefits of flexibility and balance training, including mind/body exercise modes such as yoga, pilates and tai chi.  Demonstration and skill practice provided.   Stress and Anxiety: - Provides group verbal and written instruction about the health risks of elevated stress and causes of high stress.  Discuss the correlation between heart/lung disease and anxiety and treatment options. Review healthy ways to manage with stress and anxiety.   Depression: - Provides group verbal and written instruction on the correlation between heart/lung disease and depressed mood, treatment options, and the stigmas associated with seeking treatment.   Exercise & Equipment Safety: - Individual verbal instruction and demonstration of equipment use and safety with use of the equipment.   Pulmonary Rehab from 05/15/2017 in [facility] Cardiac and Pulmonary Rehab  Date  04/18/17  Educator  [facility]  Instruction Review Code  1- Verbalizes Understanding      Infection Prevention: - Provides verbal and written material to individual with discussion of infection control including proper hand washing and proper equipment cleaning during exercise session.   Pulmonary Rehab from 05/15/2017 in [facility] Cardiac and Pulmonary Rehab  Date  04/18/17  Educator  [facility]  Instruction Review Code  1- Verbalizes  Understanding      Falls Prevention: - Provides verbal and written material to individual with discussion of falls prevention and safety.   Pulmonary Rehab from 05/15/2017 in [facility] Cardiac and Pulmonary Rehab  Date  04/18/17  Educator  [facility]  Instruction Review Code  1- Verbalizes Understanding      Diabetes: - Individual verbal and written instruction to review signs/symptoms of diabetes, desired ranges of glucose level fasting, after meals and with exercise. Advice that pre and post exercise glucose checks will be done for 3 sessions at entry of program.   Chronic Lung Diseases: - Group verbal and written instruction to review updates, respiratory medications, advancements in procedures and treatments. Discuss use of supplemental oxygen including available portable oxygen systems, continuous and intermittent flow  rates, concentrators, personal use and safety guidelines. Review proper use of inhaler and spacers. Provide informative websites for self-education.    Energy Conservation: - Provide group verbal and written instruction for methods to conserve energy, plan and organize activities. Instruct on pacing techniques, use of adaptive equipment and posture/positioning to relieve shortness of breath.   Triggers and Exacerbations: - Group verbal and written instruction to review types of environmental triggers and ways to prevent exacerbations. Discuss weather changes, air quality and the benefits of nasal washing. Review warning signs and symptoms to help prevent infections. Discuss techniques for effective airway clearance, coughing, and vibrations.   Pulmonary Rehab from 05/15/2017 in [facility] Cardiac and Pulmonary Rehab  Date  04/26/17  Educator  [facility]  Instruction Review Code  1- Verbalizes Understanding      AED/CPR: - Group verbal and written instruction with the use of models to demonstrate the basic use of the AED with the basic ABC's of resuscitation.   Anatomy and Physiology of the  Lungs: - Group verbal and written instruction with the use of models to provide basic lung anatomy and physiology related to function, structure and complications of lung disease.   Anatomy & Physiology of the Heart: - Group verbal and written instruction and models provide basic cardiac anatomy and physiology, with the coronary electrical and arterial systems. Review of Valvular disease and Heart Failure   Pulmonary Rehab from 05/15/2017 in [facility] Cardiac and Pulmonary Rehab  Date  05/10/17  Educator  [facility]  Instruction Review Code  1- Verbalizes Understanding      Cardiac Medications: - Group verbal and written instruction to review commonly prescribed medications for heart disease. Reviews the medication, class of the drug, and side effects.   Know Your Numbers and Risk Factors: -Group verbal and written instruction about important numbers in your health.  Discussion of what are risk factors and how they play a role in the disease process.  Review of Cholesterol, Blood Pressure, Diabetes, and BMI and the role they play in your overall health.   Sleep Hygiene: -Provides group verbal and written instruction about how sleep can affect your health.  Define sleep hygiene, discuss sleep cycles and impact of sleep habits. Review good sleep hygiene tips.    Other: -Provides group and verbal instruction on various topics (see comments)   Pulmonary Rehab from 05/15/2017 in [facility] Cardiac and Pulmonary Rehab  Date  05/03/17  Educator  [facility]  Instruction Review Code  1- Verbalizes Understanding [SLEEP]       Knowledge Questionnaire Score: Knowledge Questionnaire Score - 04/18/17 1126      Knowledge Questionnaire Score   Pre Score  13/18 reviewed with patient        Core Components/Risk Factors/Patient Goals at Admission: Personal Goals and Risk Factors at Admission - 04/18/17 1136      Core Components/Risk Factors/Patient Goals on Admission    Weight Management  Yes;Weight Maintenance     Intervention  Weight Management: Develop a combined nutrition and exercise program designed to reach desired caloric intake, while maintaining appropriate intake of nutrient and fiber, sodium and fats, and appropriate energy expenditure required for the weight goal.;Weight Management: Provide education and appropriate resources to help participant work on and attain dietary goals.;Weight Management/Obesity: Establish reasonable short term and long term weight goals.    Admit Weight  127 lb 9.6 oz (57.9 kg)    Goal Weight: Short Term  127 lb (57.6 kg)    Goal Weight: Long Term  127  lb (57.6 kg)    Expected Outcomes  Short Term: Continue to assess and modify interventions until short term weight is achieved;Long Term: Adherence to nutrition and physical activity/exercise program aimed toward attainment of established weight goal;Weight Maintenance: Understanding of the daily nutrition guidelines, which includes 25-35% calories from fat, 7% or less cal from saturated fats, less than 272m cholesterol, less than 1.5gm of sodium, & 5 or more servings of fruits and vegetables daily;Understanding recommendations for meals to include 15-35% energy as protein, 25-35% energy from fat, 35-60% energy from carbohydrates, less than 2027mof dietary cholesterol, 20-35 gm of total fiber daily;Understanding of distribution of calorie intake throughout the day with the consumption of 4-5 meals/snacks    Improve shortness of breath with ADL's  Yes    Intervention  Provide education, individualized exercise plan and daily activity instruction to help decrease symptoms of SOB with activities of daily living.    Expected Outcomes  Short Term: Achieves a reduction of symptoms when performing activities of daily living.    Heart Failure  Yes    Intervention  Provide a combined exercise and nutrition program that is supplemented with education, support and counseling about heart failure. Directed toward relieving symptoms such as  shortness of breath, decreased exercise tolerance, and extremity edema.    Expected Outcomes  Improve functional capacity of life;Short term: Attendance in program 2-3 days a week with increased exercise capacity. Reported lower sodium intake. Reported increased fruit and vegetable intake. Reports medication compliance.;Short term: Daily weights obtained and reported for increase. Utilizing diuretic protocols set by physician.;Long term: Adoption of self-care skills and reduction of barriers for early signs and symptoms recognition and intervention leading to self-care maintenance.    Hypertension  Yes takes medication     Intervention  Provide education on lifestyle modifcations including regular physical activity/exercise, weight management, moderate sodium restriction and increased consumption of fresh fruit, vegetables, and low fat dairy, alcohol moderation, and smoking cessation.;Monitor prescription use compliance.    Expected Outcomes  Short Term: Continued assessment and intervention until BP is < 140/9037mG in hypertensive participants. < 130/26m64m in hypertensive participants with diabetes, heart failure or chronic kidney disease.;Long Term: Maintenance of blood pressure at goal levels.       Core Components/Risk Factors/Patient Goals Review:  Goals and Risk Factor Review    Row Name 05/17/17 1222             Core Components/Risk Factors/Patient Goals Review   Personal Goals Review  Weight Management/Obesity;Improve shortness of breath with ADL's;Heart Failure;Hypertension;Stress       Review  MildGregorytes that her doctor wants her blood pressure to be below 140 025 systolic. Her weight has been good and she is interested in maintaining it. MildPeggea caregiver to her older sister and helps her to her appointments. She does not notice herself getting short of breath at home as much.       Expected Outcomes  Short: attend [facility] to improve ADL's.  Long: maintain exercise to  reduce stress and Improve ADL.          Core Components/Risk Factors/Patient Goals at Discharge (Final Review):  Goals and Risk Factor Review - 05/17/17 1222      Core Components/Risk Factors/Patient Goals Review   Personal Goals Review  Weight Management/Obesity;Improve shortness of breath with ADL's;Heart Failure;Hypertension;Stress    Review  MildDachelletes that her doctor wants her blood pressure to be below 140 427 systolic. Her weight has been good  and she is interested in maintaining it. Kaelin is a caregiver to her older sister and helps her to her appointments. She does not notice herself getting short of breath at home as much.    Expected Outcomes  Short: attend [facility] to improve ADL's.  Long: maintain exercise to reduce stress and Improve ADL.       ITP Comments: ITP Comments    Row Name 04/18/17 1058 04/24/17 0835 05/22/17 0934       ITP Comments  Medical Evaluation completed. Chart sent for review and changes to Dr. Emily Filbert Director of [place]. Diagnosis can be found in [facility] encounter 04/18/17  30 day review completed. ITP sent to Dr. Emily Filbert Director of [place]. Continue with ITP unless changes are made by physician.  30 day review completed. ITP sent to Dr. Emily Filbert Director of [place]. Continue with ITP unless changes are made by physician.        Comments: 30 day review

## 2017-05-22 NOTE — Progress Notes (Signed)
Subjective:  Patient ID: Mary Howe, female    DOB: November 05, 1932  Age: 84 y.o. MRN: [id number]  CC: The primary encounter diagnosis was B12 deficiency. Diagnoses of Vitamin D deficiency, Prediabetes, Essential hypertension, Hyperlipidemia LDL goal <100, Panlobular emphysema ([place]), Nocturnal hypoxia, Hypercholesterolemia, and Diabetes mellitus without complication ([place]) were also pertinent to this visit.  HPI Mary Howe presents for follow up on COPD,  Hypertension  And osteoporosis.  She has been feeling significantly better in the last month or two.  She is attending cardiopulmonary rehab 3 times per wee (has had PT 12 of 36 sessions) and enjoying the exercise and social interaction.  No effect on her chronic back pain despite using  2 lb weights   Sister Mary Howe has finally gotten hearing aides and is now speaking in a lower voice which is making it difficult to understand her.  Patient  has been [place] since age 44,  Wears aids.   COPD:  She is using budesonide once daily .  Tolerating it well.  No thrush  Had sleep study last week   No results found for: VITAMINB12    Outpatient Medications Prior to Visit  Medication Sig Dispense Refill  . acetaminophen (TYLENOL) 500 MG tablet Take 500 mg by mouth every 6 (six) hours as needed.    Marland Kitchen acyclovir (ZOVIRAX) 400 MG tablet Take 1 tablet (400 mg total) by mouth 5 (five) times daily. 35 tablet 0  . albuterol (PROVENTIL HFA;VENTOLIN HFA) 108 (90 Base) MCG/ACT inhaler Inhale 1-2 puffs into the lungs every 4 (four) hours as needed for wheezing or shortness of breath. 1 Inhaler 10  . benzocaine (ORAJEL) 10 % mucosal gel Use as directed in the mouth or throat 3 (three) times daily as needed for mouth pain. 5.3 g 0  . calcium carbonate (OS-CAL) 600 MG TABS tablet Take 600 mg by mouth daily with breakfast.     . cetirizine (ZYRTEC) 10 MG tablet Take 10 mg by mouth daily.    . diphenhydrAMINE (BENADRYL) 25 MG tablet Take 25 mg by mouth at bedtime as  needed.    Marland Kitchen escitalopram (LEXAPRO) 10 MG tablet TAKE 1 TABLET(10 MG) BY MOUTH DAILY 30 tablet 5  . furosemide (LASIX) 20 MG tablet Take 1 tablet (20 mg total) by mouth 2 (two) times daily. 60 tablet 11  . guaiFENesin (ROBITUSSIN) 100 MG/5ML SOLN Take 10 mLs (200 mg total) by mouth every 4 (four) hours as needed for cough or to loosen phlegm. 1200 mL 0  . lidocaine (LIDODERM) 5 % Place 1 patch onto the skin daily. Remove & Discard patch within 12 hours or as directed by MD 30 patch 0  . MULTIPLE VITAMIN PO Take 1 tablet by mouth daily.     . OXYGEN Inhale 2.5 L daily into the lungs.    . pantoprazole (PROTONIX) 40 MG tablet TAKE 1 TABLET(40 MG) BY MOUTH DAILY 90 tablet 0  . valACYclovir (VALTREX) 1000 MG tablet Take 1,000 mg by mouth 2 (two) times daily.     Marland Kitchen alendronate (FOSAMAX) 70 MG tablet Take 1 tablet (70 mg total) by mouth every 7 (seven) days. Take with a full glass of water on an empty stomach. 4 tablet 11  . bisoprolol (ZEBETA) 5 MG tablet Take 0.5 tablets (2.5 mg total) by mouth daily. 30 tablet 0  . budesonide (PULMICORT) 0.5 MG/2ML nebulizer solution Take 2 mLs (0.5 mg total) by nebulization 2 (two) times daily. 360 mL 3  . predniSONE (DELTASONE) 10  MG tablet 6 tablets on Day 1 , then reduce by 1 tablet daily until gone (Patient not taking: Reported on 05/22/2017) 21 tablet 0   No facility-administered medications prior to visit.     Review of Systems;  Patient denies headache, fevers, malaise, unintentional weight loss, skin rash, eye pain, sinus congestion and sinus pain, sore throat, dysphagia,  hemoptysis , cough, dyspnea, wheezing, chest pain, palpitations, orthopnea, edema, abdominal pain, nausea, melena, diarrhea, constipation, flank pain, dysuria, hematuria, urinary  Frequency, nocturia, numbness, tingling, seizures,  Focal weakness, Loss of consciousness,  Tremor, insomnia, depression, anxiety, and suicidal ideation.      Objective:  BP 120/68 (BP Location: Left Arm,  Patient Position: Sitting, Cuff Size: Normal)   Pulse 65   Temp (!) 97.5 F (36.4 C) (Oral)   Resp 15   Ht 5\' 4"  (1.626 m)   Wt 129 lb 12.8 oz (58.9 kg)   SpO2 96%   BMI 22.28 kg/m   BP Readings from Last 3 Encounters:  05/22/17 120/68  02/22/17 (!) 110/50  02/01/17 132/66    Wt Readings from Last 3 Encounters:  05/22/17 129 lb 12.8 oz (58.9 kg)  04/18/17 127 lb 9.6 oz (57.9 kg)  02/22/17 125 lb 8 oz (56.9 kg)    General appearance: alert, cooperative and appears stated age Ears: normal TM's and external ear canals both ears Throat: lips, mucosa, and tongue normal; teeth and gums normal Neck: no adenopathy, no carotid bruit, supple, symmetrical, trachea midline and thyroid not enlarged, symmetric, no tenderness/mass/nodules Back: symmetric, no curvature. ROM normal. No CVA tenderness. Lungs: clear to auscultation bilaterally Heart: regular rate and rhythm, S1, S2 normal, no murmur, click, rub or gallop Abdomen: soft, non-tender; bowel sounds normal; no masses,  no organomegaly Pulses: 2+ and symmetric Skin: Skin color, texture, turgor normal. No rashes or lesions Lymph nodes: Cervical, supraclavicular, and axillary nodes normal.  Lab Results  Component Value Date   HGBA1C 6.1 11/10/2016   HGBA1C 6.4 07/01/2016   HGBA1C 5.9 (H) 01/12/2016    Lab Results  Component Value Date   CREATININE 0.61 02/01/2017   CREATININE 0.59 01/18/2017   CREATININE 0.51 01/17/2017    Lab Results  Component Value Date   WBC 11.1 (H) 01/16/2017   HGB 14.8 01/16/2017   HCT 43.0 01/16/2017   PLT 282 01/16/2017   GLUCOSE 107 (H) 02/01/2017   CHOL 188 12/14/2016   TRIG 79.0 12/14/2016   HDL 88.80 12/14/2016   LDLDIRECT 89.0 07/01/2016   LDLCALC 84 12/14/2016   ALT 53 01/16/2017   AST 25 01/16/2017   NA 138 02/01/2017   K 4.4 02/01/2017   CL 99 02/01/2017   CREATININE 0.61 02/01/2017   BUN 14 02/01/2017   CO2 31 02/01/2017   TSH 4.83 (H) 11/10/2016   INR 1.09 01/16/2017    HGBA1C 6.1 11/10/2016   MICROALBUR <0.7 01/28/2016    Mm Screening Breast Tomo Bilateral  Result Date: 05/15/2017 CLINICAL DATA:  Screening. EXAM: 2D DIGITAL SCREENING BILATERAL MAMMOGRAM WITH 3D TOMO WITH CAD COMPARISON:  Previous exam(s). ACR Breast Density Category c: The breast tissue is heterogeneously dense, which may obscure small masses. FINDINGS: There are no findings suspicious for malignancy. Images were processed with CAD. IMPRESSION: No mammographic evidence of malignancy. A result letter of this screening mammogram will be mailed directly to the patient. RECOMMENDATION: Screening mammogram in one year. (Code:SM-B-01Y) BI-RADS CATEGORY  1: Negative. Electronically Signed   By: Franki Cabot M.D.   On: 05/15/2017 16:15  Assessment & Plan:   Problem List Items Addressed This Visit    Hypercholesterolemia    Mild, Untreated due to liver enzyme elevation  Lab Results  Component Value Date   CHOL 188 12/14/2016   HDL 88.80 12/14/2016   LDLCALC 84 12/14/2016   LDLDIRECT 89.0 07/01/2016   TRIG 79.0 12/14/2016   CHOLHDL 2 12/14/2016         Relevant Medications   bisoprolol (ZEBETA) 5 MG tablet   Essential hypertension    Well controlled on current regimen of 2.5 mg bystolic . Renal function stable, no changes today.      Relevant Medications   bisoprolol (ZEBETA) 5 MG tablet   Other Relevant Orders   Comprehensive metabolic panel   COPD (chronic obstructive pulmonary disease) ([place])    Sleep study done Feb 2019 indicates that 74% of her time asleep is spent with an 02 sat < 90%  Will recommend use of nocturnal oxygen  And increase use of Pulmicort to twice daily       Relevant Medications   budesonide (PULMICORT) 0.5 MG/2ML nebulizer solution   Diabetes mellitus without complication ([place])    Diagnosed with fasting glucose of 125 and a1c 6.4 .  Improved with low gi diet.  no medications needed   Lab Results  Component Value Date   HGBA1C 6.1 11/10/2016   Lab  Results  Component Value Date   MICROALBUR <0.7 01/28/2016         Nocturnal hypoxia    Secondary to advanced COPD.   Confirmed by sleep study  Nocturnal supplemental oxygen advised        Other Visit Diagnoses    B12 deficiency    -  Primary   Relevant Orders   Vitamin B12   Vitamin D deficiency       Relevant Orders   VITAMIN D 25 Hydroxy (Vit-D Deficiency, Fractures)   Prediabetes       Relevant Orders   Hemoglobin A1c   Hyperlipidemia LDL goal <100       Relevant Medications   bisoprolol (ZEBETA) 5 MG tablet   Other Relevant Orders   Lipid panel      I have discontinued Sentoria Norenberg's predniSONE. I am also having her start on Zoster Vaccine Adjuvanted. Additionally, I am having her maintain her MULTIPLE VITAMIN PO, calcium carbonate, valACYclovir, albuterol, diphenhydrAMINE, acetaminophen, benzocaine, lidocaine, cetirizine, guaiFENesin, furosemide, escitalopram, OXYGEN, pantoprazole, acyclovir, budesonide, bisoprolol, and alendronate.  Meds ordered this encounter  Medications  . budesonide (PULMICORT) 0.5 MG/2ML nebulizer solution    Sig: Take 2 mLs (0.5 mg total) by nebulization 2 (two) times daily.    Dispense:  120 mL    Refill:  11  . bisoprolol (ZEBETA) 5 MG tablet    Sig: Take 0.5 tablets (2.5 mg total) by mouth daily.    Dispense:  45 tablet    Refill:  1  . alendronate (FOSAMAX) 70 MG tablet    Sig: Take 1 tablet (70 mg total) by mouth every 7 (seven) days. Take with a full glass of water on an empty stomach.    Dispense:  4 tablet    Refill:  11  . Zoster Vaccine Adjuvanted [facility]) injection    Sig: Inject 0.5 mLs into the muscle once for 1 dose.    Dispense:  1 each    Refill:  1    Medications Discontinued During This Encounter  Medication Reason  . predniSONE (DELTASONE) 10 MG tablet Completed Course  .  budesonide (PULMICORT) 0.5 MG/2ML nebulizer solution Reorder  . bisoprolol (ZEBETA) 5 MG tablet   . alendronate (FOSAMAX) 70 MG tablet  Reorder    Follow-up: Return in about 6 months (around 11/19/2017) for prediabetes, copd .   [name], MD

## 2017-05-22 NOTE — Assessment & Plan Note (Addendum)
Sleep study done Feb 2019 indicates that 74% of her time asleep is spent with an 02 sat < 90%  Will recommend use of nocturnal oxygen  And increase use of Pulmicort to twice daily

## 2017-05-22 NOTE — Assessment & Plan Note (Signed)
Mild, Untreated due to liver enzyme elevation  Lab Results  Component Value Date   CHOL 188 12/14/2016   HDL 88.80 12/14/2016   LDLCALC 84 12/14/2016   LDLDIRECT 89.0 07/01/2016   TRIG 79.0 12/14/2016   CHOLHDL 2 12/14/2016

## 2017-05-22 NOTE — Assessment & Plan Note (Signed)
Diagnosed with fasting glucose of 125 and a1c 6.4 .  Improved with low gi diet.  no medications needed   Lab Results  Component Value Date   HGBA1C 6.1 11/10/2016   Lab Results  Component Value Date   MICROALBUR <0.7 01/28/2016

## 2017-05-22 NOTE — Assessment & Plan Note (Signed)
Well controlled on current regimen of 2.5 mg bystolic . Renal function stable, no changes today.

## 2017-05-22 NOTE — Progress Notes (Deleted)
Cardiology Office Note  Date:  05/22/2017   ID:  Mary Howe, DOB [birth year], MRN [id number]  PCP:  [name], MD   No chief complaint on file.   HPI:  84 y/o ? with a history of  COPD,  hypertension,  hyperlipidemia,  GERD,  mild pulmonary hypertension,  diagnosis of Takotsubo cardiomyopathy  who presents for follow-up of her cardiomyopathy   October 2018  admitted with acute on chronic hypoxic and hypercapnic respiratory failure in the setting of atypical pulmonary infection and AE COPD. acute encephalopathy and sepsis with hypotension requiring vasopressors.    Echocardiogram showed LV dysfunction with an EF of 25-30% with severe global hypokinesis.   Troponin 0.54.    diagnostic catheterization revealing normal coronary arteries, normal right heart filling pressures, and mild pulmonary hypertension.  EF on ventriculography has improved some to 35-45%.    She was discharged on low-dose bisoprolol and losartan losartan was  discontinued in the setting of soft blood pressures    continues to use oxygen at night but uses it only sparingly during the day.   She does check her oxygen saturation and generally, she runs over 90% on room air.  She denies chest pain, dyspnea, PND, orthopnea, dizziness, syncope, edema, or early satiety.    PMH:   has a past medical history of COPD (chronic obstructive pulmonary disease) ([place]), Diverticulitis (2015), Essential hypertension, GERD (gastroesophageal reflux disease), Hyperlipidemia, PAH (pulmonary artery hypertension) ([place]), Takotsubo cardiomyopathy, and Thyroid disease.  PSH:    Past Surgical History:  Procedure Laterality Date  . CERVICAL CONE BIOPSY    . CHOLECYSTECTOMY    . HEMORROIDECTOMY    . RIGHT/LEFT HEART CATH AND CORONARY ANGIOGRAPHY N/A 01/16/2017   Procedure: RIGHT/LEFT HEART CATH AND CORONARY ANGIOGRAPHY;  Surgeon: Wellington Hampshire, MD;  Location: [facility];  Service: Cardiovascular;  Laterality: N/A;     Current Outpatient Medications  Medication Sig Dispense Refill  . acetaminophen (TYLENOL) 500 MG tablet Take 500 mg by mouth every 6 (six) hours as needed.    Marland Kitchen acyclovir (ZOVIRAX) 400 MG tablet Take 1 tablet (400 mg total) by mouth 5 (five) times daily. 35 tablet 0  . albuterol (PROVENTIL HFA;VENTOLIN HFA) 108 (90 Base) MCG/ACT inhaler Inhale 1-2 puffs into the lungs every 4 (four) hours as needed for wheezing or shortness of breath. 1 Inhaler 10  . alendronate (FOSAMAX) 70 MG tablet Take 1 tablet (70 mg total) by mouth every 7 (seven) days. Take with a full glass of water on an empty stomach. 4 tablet 11  . benzocaine (ORAJEL) 10 % mucosal gel Use as directed in the mouth or throat 3 (three) times daily as needed for mouth pain. 5.3 g 0  . bisoprolol (ZEBETA) 5 MG tablet Take 0.5 tablets (2.5 mg total) by mouth daily. 45 tablet 1  . budesonide (PULMICORT) 0.5 MG/2ML nebulizer solution Take 2 mLs (0.5 mg total) by nebulization 2 (two) times daily. 120 mL 11  . calcium carbonate (OS-CAL) 600 MG TABS tablet Take 600 mg by mouth daily with breakfast.     . cetirizine (ZYRTEC) 10 MG tablet Take 10 mg by mouth daily.    . diphenhydrAMINE (BENADRYL) 25 MG tablet Take 25 mg by mouth at bedtime as needed.    Marland Kitchen escitalopram (LEXAPRO) 10 MG tablet TAKE 1 TABLET(10 MG) BY MOUTH DAILY 30 tablet 5  . furosemide (LASIX) 20 MG tablet Take 1 tablet (20 mg total) by mouth 2 (two) times daily. 60 tablet  11  . guaiFENesin (ROBITUSSIN) 100 MG/5ML SOLN Take 10 mLs (200 mg total) by mouth every 4 (four) hours as needed for cough or to loosen phlegm. 1200 mL 0  . lidocaine (LIDODERM) 5 % Place 1 patch onto the skin daily. Remove & Discard patch within 12 hours or as directed by MD 30 patch 0  . MULTIPLE VITAMIN PO Take 1 tablet by mouth daily.     . OXYGEN Inhale 2.5 L daily into the lungs.    . pantoprazole (PROTONIX) 40 MG tablet TAKE 1 TABLET(40 MG) BY MOUTH DAILY 90 tablet 0  . valACYclovir (VALTREX) 1000 MG  tablet Take 1,000 mg by mouth 2 (two) times daily.     Marland Kitchen Zoster Vaccine Adjuvanted [facility]) injection Inject 0.5 mLs into the muscle once for 1 dose. 1 each 1   No current facility-administered medications for this visit.      Allergies:   Simvastatin   Social History:  The patient  reports that she quit smoking about 29 years ago. Her smoking use included cigarettes. She has a 43.50 pack-year smoking history. she has never used smokeless tobacco. She reports that she drinks alcohol. She reports that she does not use drugs.   Family History:   family history includes Aneurysm in her sister; Bone cancer in her sister; Brain cancer in her brother; CAD in her brother and brother; Emphysema in her sister; Fibrocystic breast disease in her sister; Heart attack in her brother; Heart disease in her brother and sister; Lung disease in her brother; Pneumonia in her brother; Prostate cancer in her father.    Review of Systems: ROS   PHYSICAL EXAM: VS:  There were no vitals taken for this visit. , BMI There is no height or weight on file to calculate BMI. GEN: Well nourished, well developed, in no acute distress HEENT: normal Neck: no JVD, carotid bruits, or masses Cardiac: RRR; no murmurs, rubs, or gallops,no edema  Respiratory:  clear to auscultation bilaterally, normal work of breathing GI: soft, nontender, nondistended, + BS MS: no deformity or atrophy Skin: warm and dry, no rash Neuro:  Strength and sensation are intact Psych: euthymic mood, full affect    Recent Labs: 11/10/2016: TSH 4.83 01/15/2017: Magnesium 1.9 01/16/2017: Hemoglobin 14.8; Platelets 282 05/22/2017: ALT 16; BUN 13; Creatinine, Ser 0.61; Potassium 5.2; Sodium 136    Lipid Panel Lab Results  Component Value Date   CHOL 207 (H) 05/22/2017   HDL 87.40 05/22/2017   LDLCALC 102 (H) 05/22/2017   TRIG 91.0 05/22/2017      Wt Readings from Last 3 Encounters:  05/22/17 129 lb 12.8 oz (58.9 kg)  04/18/17 127 lb 9.6  oz (57.9 kg)  02/22/17 125 lb 8 oz (56.9 kg)       ASSESSMENT AND PLAN:  Acute systolic heart failure (HCC)  Nonischemic cardiomyopathy (HCC)  Essential hypertension  Panlobular emphysema (HCC)  Acute respiratory failure with hypercapnia (HCC)  Diabetes mellitus without complication (HCC)   Disposition:   F/U  6 months  No orders of the defined types were placed in this encounter.    Signed, Esmond Plants, M.D., Ph.D. 05/22/2017  [place], [address]

## 2017-05-22 NOTE — Telephone Encounter (Signed)
Sleep study results ntoed nocturnal hypoxia.  Needs to wear o2 at night.  My chart message sent

## 2017-05-22 NOTE — Patient Instructions (Addendum)
You are doing very well!!!   Your mammogram was normal  Your lungs sound great!  Please increase your pulmicort to two times daily EVERY DAY.  This medication is a MAINTENANCE MEDICATION  (TO PREVENT COPD FLARES )  You do not need fasting labs until I see you in  August

## 2017-05-22 NOTE — Progress Notes (Signed)
Daily Session Note  Patient Details  Name: Mary Howe MRN: [id number] Date of Birth: 17-Jul-1932 Referring Provider:     Pulmonary Rehab from 04/18/2017 in [facility] Cardiac and Pulmonary Rehab  Referring Provider  [place]      Encounter Date: 05/22/2017  Check In: Session Check In - 05/22/17 1125      Check-In   Location  [facility]-Cardiac & Pulmonary Rehab    Staff Present  Nada Maclachlan, BA, ACSM CEP, Exercise Physiologist;Kelly Amedeo Plenty, BS, ACSM CEP, Exercise Physiologist;Larue Drawdy Flavia Shipper    Supervising physician immediately available to respond to emergencies  [facility] immediately available ER MD    Physician(s)  Dr. Corky Downs and [place]    Medication changes reported      No    Fall or balance concerns reported     No    Warm-up and Cool-down  Performed as group-led instruction    Resistance Training Performed  Yes    VAD Patient?  No      Pain Assessment   Currently in Pain?  No/denies          Social History   Tobacco Use  Smoking Status Former Smoker  . Packs/day: 1.50  . Years: 29.00  . Pack years: 43.50  . Types: Cigarettes  . Last attempt to quit: 04/11/1988  . Years since quitting: 29.1  Smokeless Tobacco Never Used  Tobacco Comment   quit smoking in 1990    Goals Met:  Independence with exercise equipment Exercise tolerated well No report of cardiac concerns or symptoms Strength training completed today  Goals Unmet:  Not Applicable  Comments: Pt able to follow exercise prescription today without complaint.  Will continue to monitor for progression.   Dr. Emily Filbert is Medical Director for [facility] and [facility].

## 2017-05-22 NOTE — Assessment & Plan Note (Signed)
Secondary to advanced COPD.   Confirmed by sleep study  Nocturnal supplemental oxygen advised

## 2017-05-23 ENCOUNTER — Ambulatory Visit: Payer: Self-pay | Admitting: Cardiovascular Disease

## 2017-05-23 LAB — HEMOGLOBIN A1C
Hgb A1c MFr Bld: 5.8 % of total Hgb — ABNORMAL HIGH (ref <5.7)
Mean Plasma Glucose: 120 (calc)
eAG (mmol/L): 6.6 (calc)

## 2017-05-24 ENCOUNTER — Encounter: Payer: Self-pay | Admitting: Internal Medicine

## 2017-05-24 ENCOUNTER — Other Ambulatory Visit: Payer: Self-pay | Admitting: Internal Medicine

## 2017-05-24 DIAGNOSIS — J432 Centrilobular emphysema: Secondary | ICD-10-CM

## 2017-05-24 DIAGNOSIS — E875 Hyperkalemia: Secondary | ICD-10-CM

## 2017-05-24 DIAGNOSIS — I5022 Chronic systolic (congestive) heart failure: Secondary | ICD-10-CM | POA: Diagnosis not present

## 2017-05-24 NOTE — Progress Notes (Signed)
needs repeat BMET one week

## 2017-05-24 NOTE — Progress Notes (Signed)
Daily Session Note  Patient Details  Name: Mary Howe MRN: [id number] Date of Birth: [birth year] Referring Provider:     Pulmonary Rehab from 04/18/2017 in [facility] Cardiac and Pulmonary Rehab  Referring Provider  [place]      Encounter Date: 05/24/2017  Check In: Session Check In - 05/24/17 1057      Check-In   Location  [facility]-Cardiac & Pulmonary Rehab    Staff Present  Alberteen Sam, MA, RCEP, CCRP, Exercise Physiologist;Loretha Ure Alcus Dad, RN BSN    Supervising physician immediately available to respond to emergencies  [facility] immediately available ER MD    Physician(s)  Dr. Burlene Arnt and Alfred Levins    Medication changes reported      No    Fall or balance concerns reported     No    Warm-up and Cool-down  Performed as group-led instruction    Resistance Training Performed  Yes    VAD Patient?  No      Pain Assessment   Currently in Pain?  No/denies        Exercise Prescription Changes - 05/23/17 1500      Response to Exercise   Blood Pressure (Admit)  120/60    Blood Pressure (Exit)  96/54    Heart Rate (Admit)  75 bpm    Heart Rate (Exercise)  105 bpm    Heart Rate (Exit)  72 bpm    Oxygen Saturation (Admit)  94 %    Oxygen Saturation (Exercise)  89 %    Oxygen Saturation (Exit)  96 %    Rating of Perceived Exertion (Exercise)  11    Perceived Dyspnea (Exercise)  1    Symptoms  none    Duration  Continue with 45 min of aerobic exercise without signs/symptoms of physical distress.    Intensity  THRR unchanged      Progression   Progression  Continue to progress workloads to maintain intensity without signs/symptoms of physical distress.    Average METs  3.7      Resistance Training   Training Prescription  Yes    Weight  3 lbs    Reps  10-15      Interval Training   Interval Training  No      Treadmill   MPH  2.4    Grade  0.5    Minutes  15    METs  3      NuStep   Level  2    SPM  92    Minutes  15    METs  3.2      REL-XR    Level  1    Speed  60    Minutes  15    METs  4.9      Home Exercise Plan   Plans to continue exercise at  Home (comment) walking    Frequency  Add 1 additional day to program exercise sessions.    Initial Home Exercises Provided  05/10/17       Social History   Tobacco Use  Smoking Status Former Smoker  . Packs/day: 1.50  . Years: 29.00  . Pack years: 43.50  . Types: Cigarettes  . Last attempt to quit: 04/11/1988  . Years since quitting: 29.1  Smokeless Tobacco Never Used  Tobacco Comment   quit smoking in 1990    Goals Met:  Independence with exercise equipment Exercise tolerated well No report of cardiac concerns or symptoms Strength training completed today  Goals Unmet:  Not Applicable  Comments: Pt able to follow exercise prescription today without complaint.  Will continue to monitor for progression.   Dr. Emily Filbert is Medical Director for [place] and [facility].

## 2017-05-25 NOTE — Telephone Encounter (Signed)
Pt called to state labs are scheduled and she has the oxygen for at night and will follow everything that she was told to do,pt states Dr. Derrel Nip would know what pt means

## 2017-05-26 DIAGNOSIS — I5022 Chronic systolic (congestive) heart failure: Secondary | ICD-10-CM | POA: Diagnosis not present

## 2017-05-26 DIAGNOSIS — J432 Centrilobular emphysema: Secondary | ICD-10-CM | POA: Diagnosis not present

## 2017-05-27 ENCOUNTER — Telehealth: Payer: Self-pay | Admitting: Internal Medicine

## 2017-05-29 DIAGNOSIS — I5022 Chronic systolic (congestive) heart failure: Secondary | ICD-10-CM | POA: Diagnosis not present

## 2017-05-29 DIAGNOSIS — J432 Centrilobular emphysema: Secondary | ICD-10-CM

## 2017-05-29 NOTE — Progress Notes (Signed)
Daily Session Note  Patient Details  Name: Mary Howe MRN: [id number] Date of Birth: 27-Apr-1932 Referring Provider:     Pulmonary Rehab from 04/18/2017 in [facility] Cardiac and Pulmonary Rehab  Referring Provider  [place]      Encounter Date: 05/29/2017  Check In: Session Check In - 05/29/17 1136      Check-In   Location  [facility]-Cardiac & Pulmonary Rehab    Staff Present  Nada Maclachlan, BA, ACSM CEP, Exercise Physiologist;Kelly Amedeo Plenty, BS, ACSM CEP, Exercise Physiologist;Keiva Dina Flavia Shipper    Supervising physician immediately available to respond to emergencies  [facility] immediately available ER MD    Physician(s)  Jimmye Norman and Alfred Levins    Medication changes reported      No    Fall or balance concerns reported     No    Tobacco Cessation  No Change patient does not smoke    Warm-up and Cool-down  Performed as group-led instruction    Resistance Training Performed  Yes    VAD Patient?  No      Pain Assessment   Currently in Pain?  No/denies          Social History   Tobacco Use  Smoking Status Former Smoker  . Packs/day: 1.50  . Years: 29.00  . Pack years: 43.50  . Types: Cigarettes  . Last attempt to quit: 04/11/1988  . Years since quitting: 29.1  Smokeless Tobacco Never Used  Tobacco Comment   quit smoking in 1990    Goals Met:  Independence with exercise equipment Exercise tolerated well No report of cardiac concerns or symptoms Strength training completed today  Goals Unmet:  Not Applicable  Comments: Pt able to follow exercise prescription today without complaint.  Will continue to monitor for progression.   Dr. Emily Filbert is Medical Director for [place] and [facility].

## 2017-05-30 ENCOUNTER — Other Ambulatory Visit (INDEPENDENT_AMBULATORY_CARE_PROVIDER_SITE_OTHER): Payer: Medicare Other

## 2017-05-30 DIAGNOSIS — E875 Hyperkalemia: Secondary | ICD-10-CM | POA: Diagnosis not present

## 2017-05-30 LAB — BASIC METABOLIC PANEL
BUN: 17 mg/dL (ref 6–23)
CO2: 33 mEq/L — ABNORMAL HIGH (ref 19–32)
Calcium: 9.2 mg/dL (ref 8.4–10.5)
Chloride: 103 mEq/L (ref 96–112)
Creatinine, Ser: 0.74 mg/dL (ref 0.40–1.20)
GFR: 79.72 mL/min (ref >60.00)
Glucose, Bld: 88 mg/dL (ref 70–99)
POTASSIUM: 4.4 meq/L (ref 3.5–5.1)
SODIUM: 140 meq/L (ref 135–145)

## 2017-05-31 ENCOUNTER — Other Ambulatory Visit: Payer: Self-pay

## 2017-05-31 ENCOUNTER — Encounter: Payer: Self-pay | Admitting: Internal Medicine

## 2017-05-31 DIAGNOSIS — J432 Centrilobular emphysema: Secondary | ICD-10-CM | POA: Diagnosis not present

## 2017-05-31 DIAGNOSIS — I5022 Chronic systolic (congestive) heart failure: Secondary | ICD-10-CM | POA: Diagnosis not present

## 2017-05-31 NOTE — Progress Notes (Signed)
Daily Session Note  Patient Details  Name: Mary Howe MRN: [id number] Date of Birth: 31-Jul-1932 Referring Provider:     Pulmonary Rehab from 04/18/2017 in [facility] Cardiac and Pulmonary Rehab  Referring Provider  [place]      Encounter Date: 05/31/2017  Check In: Session Check In - 05/31/17 1131      Check-In   Location  [facility]-Cardiac & Pulmonary Rehab    Staff Present  Renita Papa, RN BSN;Jessica Luan Pulling, MA, RCEP, CCRP, Exercise Physiologist;Joseph Flavia Shipper    Supervising physician immediately available to respond to emergencies  [facility] immediately available ER MD    Physician(s)  Dr. Reita Cliche and Quentin Cornwall    Medication changes reported      No    Fall or balance concerns reported     No    Warm-up and Cool-down  Performed as group-led instruction    Resistance Training Performed  Yes    VAD Patient?  No      Pain Assessment   Currently in Pain?  No/denies          Social History   Tobacco Use  Smoking Status Former Smoker  . Packs/day: 1.50  . Years: 29.00  . Pack years: 43.50  . Types: Cigarettes  . Last attempt to quit: 04/11/1988  . Years since quitting: 29.1  Smokeless Tobacco Never Used  Tobacco Comment   quit smoking in 1990    Goals Met:  Proper associated with RPD/PD & O2 Sat Independence with exercise equipment Using PLB without cueing & demonstrates good technique Exercise tolerated well Strength training completed today  Goals Unmet:  Not Applicable  Comments: Pt able to follow exercise prescription today without complaint.  Will continue to monitor for progression.    Dr. Emily Filbert is Medical Director for [place] and [facility].

## 2017-06-02 DIAGNOSIS — I5022 Chronic systolic (congestive) heart failure: Secondary | ICD-10-CM | POA: Diagnosis not present

## 2017-06-02 DIAGNOSIS — J432 Centrilobular emphysema: Secondary | ICD-10-CM

## 2017-06-02 NOTE — Progress Notes (Signed)
Daily Session Note  Patient Details  Name: Shyan Scalisi MRN: [id number] Date of Birth: 29-Jan-1933 Referring Provider:     Pulmonary Rehab from 04/18/2017 in [facility] Cardiac and Pulmonary Rehab  Referring Provider  [place]      Encounter Date: 06/02/2017  Check In: Session Check In - 06/02/17 1150      Check-In   Location  [facility]Cardiac [facility] Pulmonary Rehab    Staff Present  Renita Papa, RN BSN;Joseph Darrin Nipper, [place], RCEP, [place], Exercise Physiologist    Supervising physician immediately available to respond to emergencies  [facility] immediately available ER MD    Physician(s)  Dr. Jacqualine Code and [facility]    Medication changes reported      No    Fall or balance concerns reported     No    Warm-up and Cool-down  Performed as group-led instruction    Resistance Training Performed  Yes    VAD Patient?  No      Pain Assessment   Currently in Pain?  No/denies          Social History   Tobacco Use  Smoking Status Former Smoker  . Packs/day: 1.50  . Years: 29.00  . Pack years: 43.50  . Types: Cigarettes  . Last attempt to quit: 04/11/1988  . Years since quitting: 29.1  Smokeless Tobacco Never Used  Tobacco Comment   quit smoking in 1990    Goals Met:  Independence with exercise equipment Exercise tolerated well Personal goals reviewed No report of cardiac concerns or symptoms Strength training completed today  Goals Unmet:  Not Applicable  Comments: Pt able to follow exercise prescription today without complaint.  Will continue to monitor for progression.   Dr. Emily Filbert is Medical Director for [place] and [facility].

## 2017-06-05 DIAGNOSIS — I5022 Chronic systolic (congestive) heart failure: Secondary | ICD-10-CM | POA: Diagnosis not present

## 2017-06-05 DIAGNOSIS — J432 Centrilobular emphysema: Secondary | ICD-10-CM

## 2017-06-05 NOTE — Progress Notes (Signed)
Daily Session Note  Patient Details  Name: Mary Howe MRN: [id number] Date of Birth: 10/03/1932 Referring Provider:     Pulmonary Rehab from 04/18/2017 in [facility] Cardiac and Pulmonary Rehab  Referring Provider  [place]      Encounter Date: 06/05/2017  Check In: Session Check In - 06/05/17 1145      Check-In   Location  [facility]-Cardiac & Pulmonary Rehab    Staff Present  Kelly Hayes, BS, ACSM CEP, Exercise Physiologist;Amanda Sommer, BA, ACSM CEP, Exercise Physiologist;Joseph Hood RCP,RRT,BSRT    Supervising physician immediately available to respond to emergencies  [facility] immediately available ER MD    Physician(s)  Dr. Lord and Norman    Medication changes reported      No    Fall or balance concerns reported     No    Warm-up and Cool-down  Performed as group-led instruction    Resistance Training Performed  Yes    VAD Patient?  No      Pain Assessment   Currently in Pain?  No/denies          Social History   Tobacco Use  Smoking Status Former Smoker  . Packs/day: 1.50  . Years: 29.00  . Pack years: 43.50  . Types: Cigarettes  . Last attempt to quit: 04/11/1988  . Years since quitting: 29.1  Smokeless Tobacco Never Used  Tobacco Comment   quit smoking in 1990    Goals Met:  Independence with exercise equipment Exercise tolerated well No report of cardiac concerns or symptoms Strength training completed today  Goals Unmet:  Not Applicable  Comments: Pt able to follow exercise prescription today without complaint.  Will continue to monitor for progression.   Dr. Mark Miller is Medical Director for [facility] and [facility]. 

## 2017-06-07 DIAGNOSIS — I5022 Chronic systolic (congestive) heart failure: Secondary | ICD-10-CM | POA: Diagnosis not present

## 2017-06-07 DIAGNOSIS — J432 Centrilobular emphysema: Secondary | ICD-10-CM

## 2017-06-07 NOTE — Progress Notes (Signed)
Daily Session Note  Patient Details  Name: Mary Howe MRN: [id number] Date of Birth: 15-Mar-1933 Referring Provider:     Pulmonary Rehab from 04/18/2017 in [facility] Cardiac and Pulmonary Rehab  Referring Provider  [place]      Encounter Date: 06/07/2017  Check In: Session Check In - 06/07/17 1133      Check-In   Location  [facility]Cardiac & Pulmonary Rehab    Staff Present  Justin Mend Lorre Nick, [place], RCEP, CCRP, Exercise Physiologist;Meredith Sherryll Burger, RN BSN    Supervising physician immediately available to respond to emergencies  [facility] immediately available ER MD    Physician(s)  Dr. Reita Cliche and Jimmye Norman    Medication changes reported      No    Fall or balance concerns reported     No    Tobacco Cessation  No Change    Warm-up and Cool-down  Performed as group-led instruction    Resistance Training Performed  Yes    VAD Patient?  No      Pain Assessment   Currently in Pain?  No/denies          Social History   Tobacco Use  Smoking Status Former Smoker  . Packs/day: 1.50  . Years: 29.00  . Pack years: 43.50  . Types: Cigarettes  . Last attempt to quit: 04/11/1988  . Years since quitting: 29.1  Smokeless Tobacco Never Used  Tobacco Comment   quit smoking in 1990    Goals Met:  Independence with exercise equipment Exercise tolerated well No report of cardiac concerns or symptoms Strength training completed today  Goals Unmet:  Not Applicable  Comments: Pt able to follow exercise prescription today without complaint.  Will continue to monitor for progression.   Dr. Emily Filbert is Medical Director for [facility] and [facility].

## 2017-06-09 ENCOUNTER — Encounter: Payer: Medicare Other | Attending: Internal Medicine | Admitting: *Deleted

## 2017-06-09 ENCOUNTER — Ambulatory Visit: Payer: Self-pay | Admitting: Cardiovascular Disease

## 2017-06-09 DIAGNOSIS — J432 Centrilobular emphysema: Secondary | ICD-10-CM

## 2017-06-09 DIAGNOSIS — I5022 Chronic systolic (congestive) heart failure: Secondary | ICD-10-CM | POA: Diagnosis not present

## 2017-06-09 NOTE — Progress Notes (Signed)
Daily Session Note  Patient Details  Name: Mary Howe MRN: [id number] Date of Birth: 1933/03/05 Referring Provider:     Pulmonary Rehab from 04/18/2017 in [facility] Cardiac and Pulmonary Rehab  Referring Provider  Derrel Nip      Encounter Date: 06/09/2017  Check In: Session Check In - 06/09/17 1126      Check-In   Location  [facility]Cardiac [facility] Pulmonary Rehab    Staff Present  Alberteen Sam, MA, RCEP, CCRP, Exercise Physiologist;Meredith Sherryll Burger, RN Geralyn Corwin, RN BSN    Supervising physician immediately available to respond to emergencies  [facility] immediately available ER MD    Physician(s)  Drs. Paduchowski and Schaevitz    Medication changes reported      No    Fall or balance concerns reported     No    Tobacco Cessation  No Change    Warm-up and Cool-down  Performed as group-led instruction    Resistance Training Performed  Yes      Pain Assessment   Currently in Pain?  No/denies    Multiple Pain Sites  No          Social History   Tobacco Use  Smoking Status Former Smoker  . Packs/day: 1.50  . Years: 29.00  . Pack years: 43.50  . Types: Cigarettes  . Last attempt to quit: 04/11/1988  . Years since quitting: 29.1  Smokeless Tobacco Never Used  Tobacco Comment   quit smoking in 1990    Goals Met:  Proper associated with RPD/PD & O2 Sat Independence with exercise equipment Using PLB without cueing & demonstrates good technique Exercise tolerated well Strength training completed today  Goals Unmet:  Not Applicable  Comments: Pt able to follow exercise prescription today without complaint.  Will continue to monitor for progression.    Dr. Emily Filbert is Medical Director for [place] and [facility].

## 2017-06-11 NOTE — Progress Notes (Signed)
Cardiology Office Note  Date:  06/13/2017   ID:  Mary Howe, DOB [birth year], MRN [id number]  PCP:  [name], MD   Chief Complaint  Patient presents with  . Other    3 month follow up. Patient c.o a little SOB. Meds reviewed verbally with patient.     HPI:  84 y/o ? with a history of  COPD,  hypertension,  hyperlipidemia,  GERD,  mild pulmonary hypertension,  diagnosis of Takotsubo cardiomyopathy  who presents for follow-up of her cardiomyopathy  Recent history reviewed with her in detail  October 2018  admitted with acute on chronic hypoxic and hypercapnic respiratory failure in the setting of atypical pulmonary infection and AE COPD. acute encephalopathy and sepsis with hypotension requiring vasopressors.    Echocardiogram showed LV dysfunction with an EF of 25-30% with severe global hypokinesis. Troponin 0.54.   diagnostic catheterization revealing normal coronary arteries, normal right heart filling pressures, and mild pulmonary hypertension.  EF on ventriculography has improved some to 35-45%.    She was discharged on low-dose bisoprolol and losartan losartan was  discontinued in the setting of soft blood pressures   Follow-up echocardiogram December 2018 with ejection fraction 55-60% Using oxygen at nighttime alone   She denies chest pain, dyspnea, PND, orthopnea, dizziness, syncope, edema, or early satiety.    Lots of stress, takes care of sister who is 13 Doing pulmonary rehab, exercising well, feels good  Lab work reviewed with her in detail HBA1C 5.8 Total chol 207 Sleep study ok, using oxygen at night weight at home   EKG personally reviewed by myself on todays visit Shows normal sinus rhythm with rate 71 bpm unable to exclude old inferior MI   PMH:   has a past medical history of COPD (chronic obstructive pulmonary disease) ([place]), Diverticulitis (2015), Essential hypertension, GERD (gastroesophageal reflux disease), Hyperlipidemia, PAH (pulmonary  artery hypertension) ([place]), Takotsubo cardiomyopathy, and Thyroid disease.  PSH:    Past Surgical History:  Procedure Laterality Date  . CERVICAL CONE BIOPSY    . CHOLECYSTECTOMY    . HEMORROIDECTOMY    . RIGHT/LEFT HEART CATH AND CORONARY ANGIOGRAPHY N/A 01/16/2017   Procedure: RIGHT/LEFT HEART CATH AND CORONARY ANGIOGRAPHY;  Surgeon: Wellington Hampshire, MD;  Location: [facility];  Service: Cardiovascular;  Laterality: N/A;    Current Outpatient Medications  Medication Sig Dispense Refill  . acetaminophen (TYLENOL) 500 MG tablet Take 500 mg by mouth every 6 (six) hours as needed.    Marland Kitchen acyclovir (ZOVIRAX) 400 MG tablet Take 1 tablet (400 mg total) by mouth 5 (five) times daily. 35 tablet 0  . albuterol (PROVENTIL HFA;VENTOLIN HFA) 108 (90 Base) MCG/ACT inhaler Inhale 1-2 puffs into the lungs every 4 (four) hours as needed for wheezing or shortness of breath. 1 Inhaler 10  . alendronate (FOSAMAX) 70 MG tablet Take 1 tablet (70 mg total) by mouth every 7 (seven) days. Take with a full glass of water on an empty stomach. 4 tablet 11  . benzocaine (ORAJEL) 10 % mucosal gel Use as directed in the mouth or throat 3 (three) times daily as needed for mouth pain. 5.3 g 0  . bisoprolol (ZEBETA) 5 MG tablet Take 0.5 tablets (2.5 mg total) by mouth daily. 45 tablet 1  . budesonide (PULMICORT) 0.5 MG/2ML nebulizer solution Take 2 mLs (0.5 mg total) by nebulization 2 (two) times daily. 120 mL 11  . calcium carbonate (OS-CAL) 600 MG TABS tablet Take 600 mg by mouth daily with  breakfast.     . cetirizine (ZYRTEC) 10 MG tablet Take 10 mg by mouth daily.    . diphenhydrAMINE (BENADRYL) 25 MG tablet Take 25 mg by mouth at bedtime as needed.    Marland Kitchen escitalopram (LEXAPRO) 10 MG tablet TAKE 1 TABLET(10 MG) BY MOUTH DAILY 30 tablet 5  . furosemide (LASIX) 20 MG tablet Take 1 tablet (20 mg total) by mouth 2 (two) times daily. 60 tablet 11  . guaiFENesin (ROBITUSSIN) 100 MG/5ML SOLN Take 10 mLs (200 mg total)  by mouth every 4 (four) hours as needed for cough or to loosen phlegm. 1200 mL 0  . lidocaine (LIDODERM) 5 % Place 1 patch onto the skin daily. Remove & Discard patch within 12 hours or as directed by MD 30 patch 0  . MULTIPLE VITAMIN PO Take 1 tablet by mouth daily.     . OXYGEN Inhale 2.5 L daily into the lungs.    . pantoprazole (PROTONIX) 40 MG tablet TAKE 1 TABLET(40 MG) BY MOUTH DAILY 90 tablet 0  . valACYclovir (VALTREX) 1000 MG tablet Take 1,000 mg by mouth 2 (two) times daily.      No current facility-administered medications for this visit.      Allergies:   Simvastatin   Social History:  The patient  reports that she quit smoking about 29 years ago. Her smoking use included cigarettes. She has a 43.50 pack-year smoking history. she has never used smokeless tobacco. She reports that she drinks alcohol. She reports that she does not use drugs.   Family History:   family history includes Aneurysm in her sister; Bone cancer in her sister; Brain cancer in her brother; CAD in her brother and brother; Emphysema in her sister; Fibrocystic breast disease in her sister; Heart attack in her brother; Heart disease in her brother and sister; Lung disease in her brother; Pneumonia in her brother; Prostate cancer in her father.    Review of Systems: Review of Systems  Constitutional: Negative.   Respiratory: Negative.   Cardiovascular: Negative.   Gastrointestinal: Negative.   Musculoskeletal: Negative.   Neurological: Negative.   Psychiatric/Behavioral: Negative.   All other systems reviewed and are negative.    PHYSICAL EXAM: VS:  BP 120/64 (BP Location: Left Arm, Patient Position: Sitting, Cuff Size: Normal)   Pulse 71   Ht 5\' 4"  (1.626 m)   Wt 131 lb (59.4 kg)   BMI 22.49 kg/m  , BMI Body mass index is 22.49 kg/m. Constitutional:  oriented to person, place, and time. No distress.  HENT:  Head: Normocephalic and atraumatic.  Eyes:  no discharge. No scleral icterus.  Neck:  Normal range of motion. Neck supple. No JVD present.  Cardiovascular: Normal rate, regular rhythm, normal heart sounds and intact distal pulses. Exam reveals no gallop and no friction rub. No edema No murmur heard. Pulmonary/Chest: Effort normal and breath sounds normal. No stridor. No respiratory distress.  no wheezes.  no rales.  no tenderness.  Abdominal: Soft.  no distension.  no tenderness.  Musculoskeletal: Normal range of motion.  no  tenderness or deformity.  Neurological:  normal muscle tone. Coordination normal. No atrophy Skin: Skin is warm and dry. No rash noted. not diaphoretic.  Psychiatric:  normal mood and affect. behavior is normal. Thought content normal.   Recent Labs: 11/10/2016: TSH 4.83 01/15/2017: Magnesium 1.9 01/16/2017: Hemoglobin 14.8; Platelets 282 05/22/2017: ALT 16 05/30/2017: BUN 17; Creatinine, Ser 0.74; Potassium 4.4; Sodium 140    Lipid Panel Lab Results  Component Value Date   CHOL 207 (H) 05/22/2017   HDL 87.40 05/22/2017   LDLCALC 102 (H) 05/22/2017   TRIG 91.0 05/22/2017      Wt Readings from Last 3 Encounters:  06/13/17 131 lb (59.4 kg)  05/22/17 129 lb 12.8 oz (58.9 kg)  04/18/17 127 lb 9.6 oz (57.9 kg)       ASSESSMENT AND PLAN:  NICM (nonischemic cardiomyopathy) ([place]) - Plan: EKG 12-Lead Secondary to stressors, pneumonia, COPD etc. Recommend that she stay on her current medications On very low-dose bisoprolol and Lasix Suggested she decrease Lasix down to 1 a day For weight greater than 130 pounds suggested she take extra Lasix after lunch She reports weight is currently 126 up to 127 pounds at home Ideally should need less diuretic now with normal ejection fraction  Essential hypertension Blood pressure is well controlled on today's visit. No changes made to the medications.  Centrilobular emphysema (HCC) Reports breathing status is stable Uses oxygen at nighttime alone  Lower extremity edema Component of venous  insufficiency Low back down on the Lasix, recommended compression hose  Hypercholesterolemia Recent cardiac catheterization with no significant disease Currently not on a statin  Diabetes type 2 Exercising on a regular basis, watching her diet  Disposition:   F/U 12  Months   Total encounter time more than 25 minutes  Greater than 50% was spent in counseling and coordination of care with the patient    Orders Placed This Encounter  Procedures  . EKG 12-Lead     Signed, Esmond Plants, M.D., Ph.D. 06/13/2017  [place], [place]

## 2017-06-12 ENCOUNTER — Encounter: Payer: Medicare Other | Admitting: *Deleted

## 2017-06-12 DIAGNOSIS — J432 Centrilobular emphysema: Secondary | ICD-10-CM

## 2017-06-12 DIAGNOSIS — I5022 Chronic systolic (congestive) heart failure: Secondary | ICD-10-CM | POA: Diagnosis not present

## 2017-06-12 NOTE — Progress Notes (Signed)
Daily Session Note  Patient Details  Name: Desirai Traxler MRN: [id number] Date of Birth: 1932/05/07 Referring Provider:     Pulmonary Rehab from 04/18/2017 in [facility] Cardiac and Pulmonary Rehab  Referring Provider  [place]      Encounter Date: 06/12/2017  Check In: Session Check In - 06/12/17 1141      Check-In   Location  [facility]Cardiac [facility] Pulmonary Rehab    Staff Present  Earlean Shawl, BS, ACSM CEP, Exercise Physiologist;Amanda Oletta Darter, BA, ACSM CEP, Exercise Physiologist;Joseph Flavia Shipper    Supervising physician immediately available to respond to emergencies  [facility] immediately available ER MD    Physician(s)  Drs. McShane and Williams    Medication changes reported      No    Fall or balance concerns reported     No    Warm-up and Cool-down  Performed as group-led Higher education careers adviser Performed  Yes    VAD Patient?  No      Pain Assessment   Currently in Pain?  No/denies          Social History   Tobacco Use  Smoking Status Former Smoker  . Packs/day: 1.50  . Years: 29.00  . Pack years: 43.50  . Types: Cigarettes  . Last attempt to quit: 04/11/1988  . Years since quitting: 29.1  Smokeless Tobacco Never Used  Tobacco Comment   quit smoking in 1990    Goals Met:  Proper associated with RPD/PD & O2 Sat Independence with exercise equipment Using PLB without cueing & demonstrates good technique Exercise tolerated well No report of cardiac concerns or symptoms Strength training completed today  Goals Unmet:  Not Applicable  Comments: Pt able to follow exercise prescription today without complaint.  Will continue to monitor for progression.    Dr. Emily Filbert is Medical Director for [facility] and [facility].

## 2017-06-13 ENCOUNTER — Ambulatory Visit (INDEPENDENT_AMBULATORY_CARE_PROVIDER_SITE_OTHER): Payer: Medicare Other | Admitting: Cardiovascular Disease

## 2017-06-13 ENCOUNTER — Encounter: Payer: Self-pay | Admitting: Cardiovascular Disease

## 2017-06-13 VITALS — BP 120/64 | HR 71 | Ht 64.0 in | Wt 131.0 lb

## 2017-06-13 DIAGNOSIS — E7800 Pure hypercholesterolemia, unspecified: Secondary | ICD-10-CM

## 2017-06-13 DIAGNOSIS — E119 Type 2 diabetes mellitus without complications: Secondary | ICD-10-CM

## 2017-06-13 DIAGNOSIS — R748 Abnormal levels of other serum enzymes: Secondary | ICD-10-CM

## 2017-06-13 DIAGNOSIS — I10 Essential (primary) hypertension: Secondary | ICD-10-CM

## 2017-06-13 DIAGNOSIS — J432 Centrilobular emphysema: Secondary | ICD-10-CM

## 2017-06-13 DIAGNOSIS — I428 Other cardiomyopathies: Secondary | ICD-10-CM

## 2017-06-13 DIAGNOSIS — M7989 Other specified soft tissue disorders: Secondary | ICD-10-CM

## 2017-06-13 NOTE — Patient Instructions (Addendum)
Medication Instructions:   Please decrease the furosemide down to one a day For weight of 130 pounds , restart the second furosemide (2 a day)  compression hose  Labwork:  No new labs needed  Testing/Procedures:  No further testing at this time   Follow-Up: It was a pleasure seeing you in the office today. Please call us if you have new issues that need to be addressed before your next appt.  [phone]  Your physician wants you to follow-up in: 12 months.  You will receive a reminder letter in the mail two months in advance. If you don't receive a letter, please call our office to schedule the follow-up appointment.  If you need a refill on your cardiac medications before your next appointment, please call your pharmacy.  For educational health videos Log in to : [email or web address] Or : [email or web address], password : [email or web address]

## 2017-06-14 ENCOUNTER — Encounter: Payer: Medicare Other | Admitting: *Deleted

## 2017-06-14 DIAGNOSIS — J432 Centrilobular emphysema: Secondary | ICD-10-CM

## 2017-06-14 DIAGNOSIS — I5022 Chronic systolic (congestive) heart failure: Secondary | ICD-10-CM | POA: Diagnosis not present

## 2017-06-14 NOTE — Progress Notes (Signed)
Daily Session Note  Patient Details  Name: Mary Howe MRN: [id number] Date of Birth: June 17, 1932 Referring Provider:     Pulmonary Rehab from 04/18/2017 in [facility] Cardiac and Pulmonary Rehab  Referring Provider  Tullo      Encounter Date: 06/14/2017  Check In: Session Check In - 06/14/17 1130      Check-In   Location  [facility]monary Re[facility]    Staff Present  Renita Papa, RN BSN;Joseph Darrin Nipper, [place], RCEP, [place], Exercise Physiologist    Supervising physician immediately available to respond to emergencies  [facility] immediately available ER MD    Physician(s)  Dr. Joni Fears and Jimmye Norman     Medication changes reported      No    Fall or balance concerns reported     No    Warm-up and Cool-down  Performed as group-led instruction    Resistance Training Performed  Yes    VAD Patient?  No      Pain Assessment   Currently in Pain?  No/denies          Social History   Tobacco Use  Smoking Status Former Smoker  . Packs/day: 1.50  . Years: 29.00  . Pack years: 43.50  . Types: Cigarettes  . Last attempt to quit: 04/11/1988  . Years since quitting: 29.1  Smokeless Tobacco Never Used  Tobacco Comment   quit smoking in 1990    Goals Met:  Proper associated with RPD/PD & O2 Sat Independence with exercise equipment Using PLB without cueing & demonstrates good technique Exercise tolerated well Strength training completed today  Goals Unmet:  Not Applicable  Comments: Pt able to follow exercise prescription today without complaint.  Will continue to monitor for progression.    Dr. Emily Filbert is Medical Director for [facility] and [facility].

## 2017-06-16 ENCOUNTER — Encounter: Payer: Medicare Other | Admitting: *Deleted

## 2017-06-16 DIAGNOSIS — J432 Centrilobular emphysema: Secondary | ICD-10-CM | POA: Diagnosis not present

## 2017-06-16 DIAGNOSIS — I5022 Chronic systolic (congestive) heart failure: Secondary | ICD-10-CM | POA: Diagnosis not present

## 2017-06-16 NOTE — Progress Notes (Signed)
Daily Session Note  Patient Details  Name: Briselda Naval MRN: [id number] Date of Birth: Sep 28, 1932 Referring Provider:     Pulmonary Rehab from 04/18/2017 in [facility] Cardiac and Pulmonary Rehab  Referring Provider  Tullo      Encounter Date: 06/16/2017  Check In: Session Check In - 06/16/17 1226      Check-In   Location  [facility]Cardiac [facility] Pulmonary Rehab    Staff Present  Renita Papa, RN BSN;Mary Kellie Shropshire, RN, BSN, [place] Pa, MA, RCEP, CCRP, Exercise Physiologist    Supervising physician immediately available to respond to emergencies  [facility] immediately available ER MD    Physician(s)   Dr. Reita Cliche and Archie Balboa    Medication changes reported      No    Fall or balance concerns reported     No    Warm-up and Cool-down  Performed as group-led instruction    Resistance Training Performed  Yes    VAD Patient?  No      Pain Assessment   Currently in Pain?  No/denies          Social History   Tobacco Use  Smoking Status Former Smoker  . Packs/day: 1.50  . Years: 29.00  . Pack years: 43.50  . Types: Cigarettes  . Last attempt to quit: 04/11/1988  . Years since quitting: 29.2  Smokeless Tobacco Never Used  Tobacco Comment   quit smoking in 1990    Goals Met:  Proper associated with RPD/PD & O2 Sat Independence with exercise equipment Using PLB without cueing & demonstrates good technique Exercise tolerated well Strength training completed today  Goals Unmet:  Not Applicable  Comments: Pt able to follow exercise prescription today without complaint.  Will continue to monitor for progression.    Dr. Emily Filbert is Medical Director for [facility] and [facility].

## 2017-06-19 DIAGNOSIS — J432 Centrilobular emphysema: Secondary | ICD-10-CM | POA: Diagnosis not present

## 2017-06-19 DIAGNOSIS — I5022 Chronic systolic (congestive) heart failure: Secondary | ICD-10-CM | POA: Diagnosis not present

## 2017-06-19 NOTE — Progress Notes (Signed)
Daily Session Note  Patient Details  Name: Mary Howe MRN: [id number] Date of Birth: 1932-11-18 Referring Provider:     Pulmonary Rehab from 04/18/2017 in [facility]  Referring Provider  [place]      Encounter Date: 06/19/2017  Check In: Session Check In - 06/19/17 1137      Check-In   Location  [facility]-Cardiac & Pulmonary Rehab    Staff Present  Earlean Shawl, BS, ACSM CEP, Exercise Physiologist;Skyleen Bentley Oletta Darter, BA, ACSM CEP, Exercise Physiologist;Joseph Flavia Shipper    Supervising physician immediately available to respond to emergencies  See telemetry face sheet for immediately available ER MD    Medication changes reported      No    Fall or balance concerns reported     No    Warm-up and Cool-down  Performed on first and last piece of equipment    Resistance Training Performed  Yes    VAD Patient?  No      Pain Assessment   Currently in Pain?  No/denies    Multiple Pain Sites  No          Social History   Tobacco Use  Smoking Status Former Smoker  . Packs/day: 1.50  . Years: 29.00  . Pack years: 43.50  . Types: Cigarettes  . Last attempt to quit: 04/11/1988  . Years since quitting: 29.2  Smokeless Tobacco Never Used  Tobacco Comment   quit smoking in 1990    Goals Met:  Independence with exercise equipment Exercise tolerated well No report of cardiac concerns or symptoms Strength training completed today  Goals Unmet:  Not Applicable  Comments: Pt able to follow exercise prescription today without complaint.  Will continue to monitor for progression.    Dr. Emily Filbert is Medical Director for [facility].

## 2017-06-19 NOTE — Progress Notes (Signed)
Pulmonary Individual Treatment Plan  Patient Details  Name: Mary Howe MRN: [id number] Date of Birth: December 13, 1932 Referring Provider:     Pulmonary Rehab from 04/18/2017 in [facility] Cardiac and Pulmonary Rehab  Referring Provider  Derrel Nip      Initial Encounter Date:    Pulmonary Rehab from 04/18/2017 in [facility] Cardiac and Pulmonary Rehab  Date  04/18/17  Referring Provider  Derrel Nip      Visit Diagnosis: Centrilobular emphysema ([place])  Patient's Home Medications on Admission:  Current Outpatient Medications:  .  acetaminophen (TYLENOL) 500 MG tablet, Take 500 mg by mouth every 6 (six) hours as needed., Disp: , Rfl:  .  acyclovir (ZOVIRAX) 400 MG tablet, Take 1 tablet (400 mg total) by mouth 5 (five) times daily., Disp: 35 tablet, Rfl: 0 .  albuterol (PROVENTIL HFA;VENTOLIN HFA) 108 (90 Base) MCG/ACT inhaler, Inhale 1-2 puffs into the lungs every 4 (four) hours as needed for wheezing or shortness of breath., Disp: 1 Inhaler, Rfl: 10 .  alendronate (FOSAMAX) 70 MG tablet, Take 1 tablet (70 mg total) by mouth every 7 (seven) days. Take with a full glass of water on an empty stomach., Disp: 4 tablet, Rfl: 11 .  benzocaine (ORAJEL) 10 % mucosal gel, Use as directed in the mouth or throat 3 (three) times daily as needed for mouth pain., Disp: 5.3 g, Rfl: 0 .  bisoprolol (ZEBETA) 5 MG tablet, Take 0.5 tablets (2.5 mg total) by mouth daily., Disp: 45 tablet, Rfl: 1 .  budesonide (PULMICORT) 0.5 MG/2ML nebulizer solution, Take 2 mLs (0.5 mg total) by nebulization 2 (two) times daily., Disp: 120 mL, Rfl: 11 .  calcium carbonate (OS-CAL) 600 MG TABS tablet, Take 600 mg by mouth daily with breakfast. , Disp: , Rfl:  .  cetirizine (ZYRTEC) 10 MG tablet, Take 10 mg by mouth daily., Disp: , Rfl:  .  diphenhydrAMINE (BENADRYL) 25 MG tablet, Take 25 mg by mouth at bedtime as needed., Disp: , Rfl:  .  escitalopram (LEXAPRO) 10 MG tablet, TAKE 1 TABLET(10 MG) BY MOUTH DAILY, Disp: 30 tablet, Rfl: 5 .  furosemide  (LASIX) 20 MG tablet, Take 1 tablet (20 mg total) by mouth 2 (two) times daily., Disp: 60 tablet, Rfl: 11 .  guaiFENesin (ROBITUSSIN) 100 MG/5ML SOLN, Take 10 mLs (200 mg total) by mouth every 4 (four) hours as needed for cough or to loosen phlegm., Disp: 1200 mL, Rfl: 0 .  lidocaine (LIDODERM) 5 %, Place 1 patch onto the skin daily. Remove & Discard patch within 12 hours or as directed by MD, Disp: 30 patch, Rfl: 0 .  MULTIPLE VITAMIN PO, Take 1 tablet by mouth daily. , Disp: , Rfl:  .  OXYGEN, Inhale 2.5 L daily into the lungs., Disp: , Rfl:  .  pantoprazole (PROTONIX) 40 MG tablet, TAKE 1 TABLET(40 MG) BY MOUTH DAILY, Disp: 90 tablet, Rfl: 0 .  valACYclovir (VALTREX) 1000 MG tablet, Take 1,000 mg by mouth 2 (two) times daily. , Disp: , Rfl:   Past Medical History: Past Medical History:  Diagnosis Date  . COPD (chronic obstructive pulmonary disease) ([place])   . Diverticulitis 2015  . Essential hypertension   . GERD (gastroesophageal reflux disease)   . Hyperlipidemia   . PAH (pulmonary artery hypertension) ([place])    a. 01/2017 Echo: PASP 26mHg; b. 01/2017 RHC: mild PAH.  . Takotsubo cardiomyopathy    a. 01/2017 Echo: EF 25-30%, sev glob HK w/ basal regions moving well. Gr1 DD. mild MR, mildly dil  LA, nl RV fxn, mild to mod TR, PASP 27mHg; b. 01/2017 Cath: Nl cors w/ EF 35-45%. HK of distal segments suggestive of Takotsubo CM.   .Marland KitchenThyroid disease     Tobacco Use: Social History   Tobacco Use  Smoking Status Former Smoker  . Packs/day: 1.50  . Years: 29.00  . Pack years: 43.50  . Types: Cigarettes  . Last attempt to quit: 04/11/1988  . Years since quitting: 29.2  Smokeless Tobacco Never Used  Tobacco Comment   quit smoking in 1990    Labs: Recent Review Flowsheet Data    Labs for ITP Cardiac and Pulmonary Rehab Latest Ref Rng & Units 11/10/2016 12/14/2016 01/11/2017 01/11/2017 05/22/2017   Cholestrol 0 - 200 mg/dL 151 188 - - 207(H)   LDLCALC 0 - 99 mg/dL 46 84 - - 102(H)    LDLDIRECT mg/dL - - - - -   HDL >39.00 mg/dL 93.80 88.80 - - 87.40   Trlycerides 0.0 - 149.0 mg/dL 56.0 79.0 - - 91.0   Hemoglobin A1c <5.7 % of total Hgb 6.1 - - - 5.8(H)   PHART 7.350 - 7.450 - - - 7.21(L) -   PCO2ART 32.0 - 48.0 mmHg - - - 48 -   HCO3 20.0 - 28.0 mmol/L - - 25.3 19.2(L) -   ACIDBASEDEF 0.0 - 2.0 mmol/L - - 10.5(H) 8.8(H) -   O2SAT % - - 84.6 99.0 -       Pulmonary Assessment Scores: Pulmonary Assessment Scores    Row Name 04/18/17 1122 05/29/17 1140       ADL UCSD   ADL Phase  Entry  Mid    SOB Score total  13  19    Rest  1  0    Walk  0  0    Stairs  1  2    Bath  0  0    Dress  0  0    Shop  0  0      CAT Score   CAT Score  14  -      mMRC Score   mMRC Score  1  -       Pulmonary Function Assessment: Pulmonary Function Assessment - 04/18/17 1124      Breath   Bilateral Breath Sounds  Clear    Shortness of Breath  No       Exercise Target Goals:    Exercise Program Goal: Individual exercise prescription set using results from initial 6 min walk test and THRR while considering  patient's activity barriers and safety.    Exercise Prescription Goal: Initial exercise prescription builds to 30-45 minutes a day of aerobic activity, 2-3 days per week.  Home exercise guidelines will be given to patient during program as part of exercise prescription that the participant will acknowledge.  Activity Barriers & Risk Stratification:   6 Minute Walk: 6 Minute Walk    Row Name 04/18/17 1227         6 Minute Walk   Distance  1040 feet     Walk Time  6 minutes     # of Rest Breaks  0     MPH  1.97     METS  1.98     RPE  10     Perceived Dyspnea   1     VO2 Peak  6.94     Symptoms  No     Resting HR  78 bpm  Resting BP  134/64     Resting Oxygen Saturation   95 %     Exercise Oxygen Saturation  during 6 min walk  89 %     Max Ex. HR  96 bpm     Max Ex. BP  130/60     2 Minute Post BP  120/60       Interval HR   1 Minute HR   82     2 Minute HR  79     4 Minute HR  86     5 Minute HR  92     6 Minute HR  96     2 Minute Post HR  74     Interval Heart Rate?  Yes       Interval Oxygen   Interval Oxygen?  Yes     Baseline Oxygen Saturation %  95 %     1 Minute Oxygen Saturation %  94 %     1 Minute Liters of Oxygen  0 L     2 Minute Oxygen Saturation %  89 %     2 Minute Liters of Oxygen  0 L     3 Minute Liters of Oxygen  0 L     4 Minute Oxygen Saturation %  94 %     4 Minute Liters of Oxygen  0 L     5 Minute Oxygen Saturation %  93 %     5 Minute Liters of Oxygen  0 L     6 Minute Oxygen Saturation %  91 %     6 Minute Liters of Oxygen  0 L     2 Minute Post Oxygen Saturation %  95 %     2 Minute Post Liters of Oxygen  0 L       Oxygen Initial Assessment: Oxygen Initial Assessment - 04/18/17 1126      Home Oxygen   Home Oxygen Device  Home Concentrator;E-Tanks    Sleep Oxygen Prescription  Continuous    Liters per minute  2.5    Home Exercise Oxygen Prescription  Continuous she uses only when things are strenuous    Liters per minute  2.5    Home at Rest Exercise Oxygen Prescription  Continuous    Liters per minute  2.5    Compliance with Home Oxygen Use  Yes She does not use her oxygen all the time at home. Her oxygen saturations have been better. When she uses exertion she uses oxygen      Initial 6 min Walk   Oxygen Used  None      Program Oxygen Prescription   Program Oxygen Prescription  None      Intervention   Short Term Goals  To learn and understand importance of maintaining oxygen saturations>88%;To learn and demonstrate proper pursed lip breathing techniques or other breathing techniques.;To learn and understand importance of monitoring SPO2 with pulse oximeter and demonstrate accurate use of the pulse oximeter.;To learn and exhibit compliance with exercise, home and travel O2 prescription;To learn and demonstrate proper use of respiratory medications    Long  Term Goals   Exhibits compliance with exercise, home and travel O2 prescription;Verbalizes importance of monitoring SPO2 with pulse oximeter and return demonstration;Maintenance of O2 saturations>88%;Exhibits proper breathing techniques, such as pursed lip breathing or other method taught during program session;Compliance with respiratory medication;Demonstrates proper use of MDI's       Oxygen Re-Evaluation: Oxygen Re-Evaluation  Mary Howe Name 04/26/17 1128 06/02/17 1151           Program Oxygen Prescription   Program Oxygen Prescription  -  None        Home Oxygen   Home Oxygen Device  -  Home Concentrator      Sleep Oxygen Prescription  -  Continuous      Liters per minute  -  2      Home Exercise Oxygen Prescription  -  None      Home at Rest Exercise Oxygen Prescription  -  None      Compliance with Home Oxygen Use  -  Yes        Goals/Expected Outcomes   Short Term Goals  To learn and demonstrate proper pursed lip breathing techniques or other breathing techniques.;To learn and understand importance of maintaining oxygen saturations>88%  To learn and demonstrate proper pursed lip breathing techniques or other breathing techniques.;To learn and understand importance of maintaining oxygen saturations>88%;To learn and exhibit compliance with exercise, home and travel O2 prescription;To learn and understand importance of monitoring SPO2 with pulse oximeter and demonstrate accurate use of the pulse oximeter.;To learn and demonstrate proper use of respiratory medications      Long  Term Goals  Maintenance of O2 saturations>88%;Exhibits proper breathing techniques, such as pursed lip breathing or other method taught during program session  Exhibits proper breathing techniques, such as pursed lip breathing or other method taught during program session;Exhibits compliance with exercise, home and travel O2 prescription;Verbalizes importance of monitoring SPO2 with pulse oximeter and return  demonstration;Maintenance of O2 saturations>88%;Compliance with respiratory medication;Demonstrates proper use of MDI's      Comments  Reviewed PLB technique with pt.  Talked about how it work and it's important to maintaining his exercise saturations.    Mary Howe has been doing well with her oxygen theapy at home. The biggest problem is getting tangled in her cannual at night.  She continues to use her pulse oximeter to monitor her saturations which have all been above 88%.  She uses her nebulizer twice a day and has not needed her inhaler.  She is doing well with her medications. She has continued to use the PLB without queueing.       Goals/Expected Outcomes  Short: Become more profiecient at using PLB.   Long: Become independent at using PLB.  Short: Continue to be complaint wiht night oxygen and PLB.  Long: Continue to use PLB.          Oxygen Discharge (Final Oxygen Re-Evaluation): Oxygen Re-Evaluation - 06/02/17 1151      Program Oxygen Prescription   Program Oxygen Prescription  None      Home Oxygen   Home Oxygen Device  Home Concentrator    Sleep Oxygen Prescription  Continuous    Liters per minute  2    Home Exercise Oxygen Prescription  None    Home at Rest Exercise Oxygen Prescription  None    Compliance with Home Oxygen Use  Yes      Goals/Expected Outcomes   Short Term Goals  To learn and demonstrate proper pursed lip breathing techniques or other breathing techniques.;To learn and understand importance of maintaining oxygen saturations>88%;To learn and exhibit compliance with exercise, home and travel O2 prescription;To learn and understand importance of monitoring SPO2 with pulse oximeter and demonstrate accurate use of the pulse oximeter.;To learn and demonstrate proper use of respiratory medications    Long  Term Goals  Exhibits proper  breathing techniques, such as pursed lip breathing or other method taught during program session;Exhibits compliance with exercise, home and  travel O2 prescription;Verbalizes importance of monitoring SPO2 with pulse oximeter and return demonstration;Maintenance of O2 saturations>88%;Compliance with respiratory medication;Demonstrates proper use of MDI's    Comments  Mary Howe has been doing well with her oxygen theapy at home. The biggest problem is getting tangled in her cannual at night.  She continues to use her pulse oximeter to monitor her saturations which have all been above 88%.  She uses her nebulizer twice a day and has not needed her inhaler.  She is doing well with her medications. She has continued to use the PLB without queueing.     Goals/Expected Outcomes  Short: Continue to be complaint wiht night oxygen and PLB.  Long: Continue to use PLB.        Initial Exercise Prescription: Initial Exercise Prescription - 04/18/17 1200      Date of Initial Exercise RX and Referring Provider   Date  04/18/17    Referring Provider  Tullo      Treadmill   MPH  1.8    Grade  0    Minutes  15    METs  2      NuStep   Level  2    SPM  80    Minutes  15    METs  2      REL-XR   Level  2    Speed  50    Minutes  15    METs  2      Prescription Details   Frequency (times per week)  3    Duration  Progress to 45 minutes of aerobic exercise without signs/symptoms of physical distress      Intensity   THRR 40-80% of Max Heartrate  102-126    Ratings of Perceived Exertion  11-13    Perceived Dyspnea  0-4      Resistance Training   Training Prescription  Yes    Weight  2 lb    Reps  10-15       Perform Capillary Blood Glucose checks as needed.  Exercise Prescription Changes: Exercise Prescription Changes    Row Name 05/10/17 1500 05/23/17 1500 06/05/17 1600         Response to Exercise   Blood Pressure (Admit)  124/62  120/60  112/75     Blood Pressure (Exit)  126/64  96/54  106/62     Heart Rate (Admit)  74 bpm  75 bpm  78 bpm     Heart Rate (Exercise)  95 bpm  105 bpm  99 bpm     Heart Rate (Exit)  79 bpm   72 bpm  99 bpm     Oxygen Saturation (Admit)  96 %  94 %  96 %     Oxygen Saturation (Exercise)  90 %  89 %  90 %     Oxygen Saturation (Exit)  93 %  96 %  97 %     Rating of Perceived Exertion (Exercise)  _0 Perceived Dyspnea (Exercise)  _1 Symptoms  none  none  none     Duration  Continue with 45 min of aerobic exercise without signs/symptoms of physical distress.  Continue with 45 min of aerobic exercise without signs/symptoms of physical distress.  Continue with 45 min of aerobic exercise  without signs/symptoms of physical distress.     Intensity  THRR unchanged  THRR unchanged  THRR unchanged       Progression   Progression  Continue to progress workloads to maintain intensity without signs/symptoms of physical distress.  Continue to progress workloads to maintain intensity without signs/symptoms of physical distress.  Continue to progress workloads to maintain intensity without signs/symptoms of physical distress.     Average METs  3.95  3.7  3.55       Resistance Training   Training Prescription  Yes  Yes  Yes     Weight  3 lbs  3 lbs  3 lbs     Reps  10-15  10-15  10-15       Interval Training   Interval Training  No  No  No       Treadmill   MPH  2.3  2.4  2.6     Grade  0  0.5  1     Minutes  _0 METs  2.76  3  3.35       NuStep   Level  _1 SPM  87  92  79     Minutes  _2 METs  3.5  3.2  3.1       REL-XR   Level  _3 Speed  65  60  52     Minutes  _4 METs  5.6  4.9  4.2       Home Exercise Plan   Plans to continue exercise at  Home (comment) walking  Home (comment) walking  Home (comment) walking     Frequency  Add 1 additional day to program exercise sessions.  Add 1 additional day to program exercise sessions.  Add 2 additional days to program exercise sessions.     Initial Home Exercises Provided  05/10/17  05/10/17  05/10/17        Exercise Comments: Exercise Comments    Row Name  04/26/17 1128           Exercise Comments  First full day of exercise!  Patient was oriented to gym and equipment including functions, settings, policies, and procedures.  Patient's individual exercise prescription and treatment plan were reviewed.  All starting workloads were established based on the results of the 6 minute walk test done at initial orientation visit.  The plan for exercise progression was also introduced and progression will be customized based on patient's performance and goals.          Exercise Goals and Review: Exercise Goals    Row Name 04/18/17 1227             Exercise Goals   Increase Physical Activity  Yes       Intervention  Provide advice, education, support and counseling about physical activity/exercise needs.;Develop an individualized exercise prescription for aerobic and resistive training based on initial evaluation findings, risk stratification, comorbidities and participant's personal goals.       Expected Outcomes  Achievement of increased cardiorespiratory fitness and enhanced flexibility, muscular endurance and strength shown through measurements of functional capacity and personal statement of participant.       Increase Strength and Stamina  Yes       Intervention  Provide advice,  education, support and counseling about physical activity/exercise needs.;Develop an individualized exercise prescription for aerobic and resistive training based on initial evaluation findings, risk stratification, comorbidities and participant's personal goals.       Expected Outcomes  Achievement of increased cardiorespiratory fitness and enhanced flexibility, muscular endurance and strength shown through measurements of functional capacity and personal statement of participant.       Able to understand and use rate of perceived exertion (RPE) scale  Yes       Intervention  Provide education and explanation on how to use RPE scale       Expected Outcomes  Short Term:  Able to use RPE daily in rehab to express subjective intensity level;Long Term:  Able to use RPE to guide intensity level when exercising independently       Able to understand and use Dyspnea scale  Yes       Intervention  Provide education and explanation on how to use Dyspnea scale       Expected Outcomes  Short Term: Able to use Dyspnea scale daily in rehab to express subjective sense of shortness of breath during exertion;Long Term: Able to use Dyspnea scale to guide intensity level when exercising independently       Knowledge and understanding of Target Heart Rate Range (THRR)  Yes       Intervention  Provide education and explanation of THRR including how the numbers were predicted and where they are located for reference       Expected Outcomes  Short Term: Able to state/look up THRR;Long Term: Able to use THRR to govern intensity when exercising independently;Short Term: Able to use daily as guideline for intensity in rehab       Able to check pulse independently  Yes       Intervention  Provide education and demonstration on how to check pulse in carotid and radial arteries.;Review the importance of being able to check your own pulse for safety during independent exercise       Expected Outcomes  Short Term: Able to explain why pulse checking is important during independent exercise;Long Term: Able to check pulse independently and accurately       Understanding of Exercise Prescription  Yes       Intervention  Provide education, explanation, and written materials on patient's individual exercise prescription       Expected Outcomes  Short Term: Able to explain program exercise prescription;Long Term: Able to explain home exercise prescription to exercise independently          Exercise Goals Re-Evaluation : Exercise Goals Re-Evaluation    Row Name 04/26/17 1127 05/10/17 1232 05/23/17 1526 06/02/17 1147 06/05/17 1631     Exercise Goal Re-Evaluation   Exercise Goals Review  Able to  understand and use rate of perceived exertion (RPE) scale;Knowledge and understanding of Target Heart Rate Range (THRR);Able to understand and use Dyspnea scale;Understanding of Exercise Prescription  Increase Physical Activity;Able to understand and use Dyspnea scale;Understanding of Exercise Prescription;Increase Strength and Stamina;Knowledge and understanding of Target Heart Rate Range (THRR);Able to understand and use rate of perceived exertion (RPE) scale;Able to check pulse independently  Increase Physical Activity;Understanding of Exercise Prescription;Increase Strength and Stamina  Increase Physical Activity;Understanding of Exercise Prescription;Increase Strength and Stamina  Increase Physical Activity;Understanding of Exercise Prescription;Increase Strength and Stamina   Comments  Reviewed RPE scale, THR and program prescription with pt today.  Pt voiced understanding and was given a copy of goals to take home.  Mary Howe is off to a good start in rehab. She is starting to notice an increase in her strength and stamina.  We talked about adding in home exercise to help.  Reviewed home exercise with pt today.  Pt plans to walk at home for exercise.  Reviewed THR, pulse, RPE, sign and symptoms, and when to call 911 or MD.  Also discussed weather considerations and indoor options.  Pt voiced understanding.  Mary Howe continues to do well in rehab.  Se  is now up to 2.4 mph on the treadmill.  We will attempt to increase her workload on the XR as she was able to do level 4 one day.  We will continue to monitor her progression.   Mary Howe is doing well in rehab.  She is feeling stronger and has more stamina.  She has been going up and down the step more often.  She does the leg and arm exercises daily and walks 2-3 extra days a week. Overall she is feeling better.   Mary Howe continues to do well in rehab.  She is now up to level 4 on the NuStep.  We will be moving up her workload on the treadmill at her next visit.   We will continue to monitor her progression.    Expected Outcomes  Short: Use RPE daily to regulate intensity.  Long: Follow program prescription in THR.  Short: Add in at least one extra day of walking at home.  Long: Continue to increase physical activity  Short: Continue to try to walk some at home.   Long: Continue to work on increase activity levels.   Short: Continue to focus on getting her walk in at home at least two days every week.   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   Short: Increase workload on treadmill and XR.  Long: Continue to walk at home on off days.       Discharge Exercise Prescription (Final Exercise Prescription Changes): Exercise Prescription Changes - 06/05/17 1600      Response to Exercise   Blood Pressure (Admit)  112/75    Blood Pressure (Exit)  106/62    Heart Rate (Admit)  78 bpm    Heart Rate (Exercise)  99 bpm    Heart Rate (Exit)  99 bpm    Oxygen Saturation (Admit)  96 %    Oxygen Saturation (Exercise)  90 %    Oxygen Saturation (Exit)  97 %    Rating of Perceived Exertion (Exercise)  12    Perceived Dyspnea (Exercise)  1    Symptoms  none    Duration  Continue with 45 min of aerobic exercise without signs/symptoms of physical distress.    Intensity  THRR unchanged      Progression   Progression  Continue to progress workloads to maintain intensity without signs/symptoms of physical distress.    Average METs  3.55      Resistance Training   Training Prescription  Yes    Weight  3 lbs    Reps  10-15      Interval Training   Interval Training  No      Treadmill   MPH  2.6    Grade  1    Minutes  15    METs  3.35      NuStep   Level  4    SPM  79    Minutes  15    METs  3.1  REL-XR   Level  3    Speed  52    Minutes  15    METs  4.2      Home Exercise Plan   Plans to continue exercise at  Home (comment) walking    Frequency  Add 2 additional days to program exercise sessions.    Initial Home Exercises  Provided  05/10/17       Nutrition:  Target Goals: Understanding of nutrition guidelines, daily intake of sodium <15100m, cholesterol <2055m calories 30% from fat and 7% or less from saturated fats, daily to have 5 or more servings of fruits and vegetables.  Biometrics: Pre Biometrics - 04/18/17 1226      Pre Biometrics   Height  _0  (1.626 m)    Weight  127 lb 9.6 oz (57.9 kg)    Waist Circumference  29.75 inches    Hip Circumference  39 inches    Waist to Hip Ratio  0.76 %    BMI (Calculated)  21.89        Nutrition Therapy Plan and Nutrition Goals: Nutrition Therapy & Goals - 04/18/17 1121      Personal Nutrition Goals   Comments  She plans to meet with the dietician. She states she eats pretty well and watches what she eats.       Intervention Plan   Intervention  Nutrition handout(s) given to patient.;Prescribe, educate and counsel regarding individualized specific dietary modifications aiming towards targeted core components such as weight, hypertension, lipid management, diabetes, heart failure and other comorbidities.    Expected Outcomes  Short Term Goal: Understand basic principles of dietary content, such as calories, fat, sodium, cholesterol and nutrients.;Long Term Goal: Adherence to prescribed nutrition plan.       Nutrition Assessments: Nutrition Assessments - 05/08/17 1518      MEDFICTS Scores   Pre Score  27       Nutrition Goals Re-Evaluation: Nutrition Goals Re-Evaluation    Row Name 05/17/17 1213 06/02/17 1159           Goals   Nutrition Goal  Meet with dietician to make sure she is eating enough and eating well.   Meet with dietician to make sure she is eating enough and eating well.       Comment  Mary Howe not yet met with dietician.  She was waiting to see if her daughter wanted to go the appointment with her, but she forgets to ask.  Appointment scheduled for 2/13 during class.   Appointment moved to 2/27 during class.       Expected  Outcome  Short: Meet with dietician.  Long: Follow recommendations outlined by dietician.  Short: Meet with dietician.  Long: Follow recommendations outlined by dietician.         Nutrition Goals Discharge (Final Nutrition Goals Re-Evaluation): Nutrition Goals Re-Evaluation - 06/02/17 1159      Goals   Nutrition Goal  Meet with dietician to make sure she is eating enough and eating well.     Comment  Appointment moved to 2/27 during class.     Expected Outcome  Short: Meet with dietician.  Long: Follow recommendations outlined by dietician.       Psychosocial: Target Goals: Acknowledge presence or absence of significant depression and/or stress, maximize coping skills, provide positive support system. Participant is able to verbalize types and ability to use techniques and skills needed for reducing stress and depression.   Initial Review & Psychosocial Screening: Initial Psych  Review & Screening - 04/18/17 1116      Initial Review   Current issues with  Current Stress Concerns    Source of Stress Concerns  Unable to perform yard/household activities    Comments  Under some stress. Her sister that she lives with is 8 and sometimes she gets on her nerves. She does not get short of breath that often but sometimes she gets short of breath when she is grocery shopping. She moved from [place] in 2014.       Family Dynamics   Good Support System?  Yes    Comments  Her daughter helps her when she needs it.       Barriers   Psychosocial barriers to participate in program  The patient should benefit from training in stress management and relaxation.      Screening Interventions   Interventions  Yes;Encouraged to exercise;Program counselor consult;Provide feedback about the scores to participant;To provide support and resources with identified psychosocial needs    Expected Outcomes  Short Term goal: Utilizing psychosocial counselor, staff and physician to assist with identification of  specific Stressors or current issues interfering with healing process. Setting desired goal for each stressor or current issue identified.;Long Term Goal: Stressors or current issues are controlled or eliminated.;Short Term goal: Identification and review with participant of any Quality of Life or Depression concerns found by scoring the questionnaire.;Long Term goal: The participant improves quality of Life and PHQ9 Scores as seen by post scores and/or verbalization of changes       Quality of Life Scores:  Scores of 19 and below usually indicate a poorer quality of life in these areas.  A difference of  2-3 points is a clinically meaningful difference.  A difference of 2-3 points in the total score of the Quality of Life Index has been associated with significant improvement in overall quality of life, self-image, physical symptoms, and general health in studies assessing change in quality of life.  PHQ-9: Recent Review Flowsheet Data    Depression screen [facility] 2/9 05/29/2017 04/18/2017 09/20/2016 06/22/2016 02/23/2015   Decreased Interest 0 0 0 0 0   Down, Depressed, Hopeless 0 0 0 0 0   PHQ - 2 Score 0 0 0 0 0   Altered sleeping 0 0 - - -   Tired, decreased energy 0 0 - - -   Change in appetite 0 0 - - -   Feeling bad or failure about yourself  0 0 - - -   Trouble concentrating 0 0 - - -   Moving slowly or fidgety/restless 0 0 - - -   Suicidal thoughts 0 0 - - -   PHQ-9 Score 0 0 - - -   Difficult doing work/chores Not difficult at all Not difficult at all - - -     Interpretation of Total Score  Total Score Depression Severity:  1-4 = Minimal depression, 5-9 = Mild depression, 10-14 = Moderate depression, 15-19 = Moderately severe depression, 20-27 = Severe depression   Psychosocial Evaluation and Intervention: Psychosocial Evaluation - 05/17/17 1223      Psychosocial Evaluation & Interventions   Interventions  Encouraged to exercise with the program and follow exercise  prescription;Stress management education;Relaxation education    Comments  Counselor met with [facility]) today for initial psychosocial evaluation.  She is an 84 year old who has emphysema. She has a strong support system with a daughter close by; lives with her older sister; and  is actively involved in her local church.  Marlys states she sleeps well but has a sleep study scheduled soon since she uses 0xygen at night.  She has a fair appetite and denies a history of depression or anxiety or any current symptoms.  Tamorah states she is typically in a positive mood and has minimal stress in her life - other than her health and challenges living with her old sister at times.  She has goals to breathe better and improve her overall quality of life while in this program.  Staff will follow with her.     Expected Outcomes  Mary Howe will benefit from consistent exercise to achieve her stated goals.  The educational and psychoeducational parts of this program will be helpful in understanding her condition and coping more positively in general.      Continue Psychosocial Services   Follow up required by staff       Psychosocial Re-Evaluation: Psychosocial Re-Evaluation    [place] Name 06/02/17 1156 06/14/17 1159           Psychosocial Re-Evaluation   Current issues with  Current Stress Concerns  Current Stress Concerns      Comments  Luv continues to care for her sister and that has been her biggest stressor.  She continues to learn when to just walk away.  She has found coming to class to be helpful to get out and about.  Mary Howe is also able to do more now with less shortness of breath.  She is remaining postive and coping the best she can with her sister.  Counselor follow with Mary Howe today after attending the Stress and anxiety education.  She reports having learned new ways of coping with her older sister and actually has become more compassionate - realizing her 59 year old sister is not  going to change.  Giavana is learning to let things go and is enjoying working out for stress.  She is experiencing progress with more energy and breathing better.  Counselor commended [place] on her progress made in positive self-care.  Staff will continue to follow with her      Expected Outcomes  Short: Continue to cope with her sister postively.   Long: Continue to remain postive.   Arthella will continue to practice stress management in coping with her older sister.  She will continue to exercise for physiological benefits as well as mental and emotional benefits.        Interventions  Stress management education;Encouraged to attend Pulmonary Rehabilitation for the exercise  -      Continue Psychosocial Services   Follow up required by staff  -      Comments  Under some stress. Her sister that she lives with is 80 and sometimes she gets on her nerves. She does not get short of breath that often but sometimes she gets short of breath when she is grocery shopping. She moved from [place] in 2014.   -        Initial Review   Source of Stress Concerns  Family  -         Psychosocial Discharge (Final Psychosocial Re-Evaluation): Psychosocial Re-Evaluation - 06/14/17 1159      Psychosocial Re-Evaluation   Current issues with  Current Stress Concerns    Comments  Counselor follow with Mary Howe today after attending the Stress and anxiety education.  She reports having learned new ways of coping with her older sister and actually has become more compassionate - realizing  her 95 year old sister is not going to change.  Mary Howe is learning to let things go and is enjoying working out for stress.  She is experiencing progress with more energy and breathing better.  Counselor commended Mary Howe on her progress made in positive self-care.  Staff will continue to follow with her    Expected Outcomes  Mary Howe will continue to practice stress management in coping with her older sister.  She will continue to  exercise for physiological benefits as well as mental and emotional benefits.         Education: Education Goals: Education classes will be provided on a weekly basis, covering required topics. Participant will state understanding/return demonstration of topics presented.  Learning Barriers/Preferences: Learning Barriers/Preferences - 04/18/17 1126      Learning Barriers/Preferences   Learning Barriers  Sight wears glasses    Learning Preferences  None       Education Topics:  Initial Evaluation Education: - Verbal, written and demonstration of respiratory meds, oximetry and breathing techniques. Instruction on use of nebulizers and MDIs and importance of monitoring MDI activations.   Pulmonary Rehab from 06/14/2017 in [facility] Cardiac and Pulmonary Rehab  Date  04/18/17  Educator  [facility]  Instruction Review Code  1- Verbalizes Understanding      General Nutrition Guidelines/Fats and Fiber: -Group instruction provided by verbal, written material, models and posters to present the general guidelines for heart healthy nutrition. Gives an explanation and review of dietary fats and fiber.   Pulmonary Rehab from 06/14/2017 in [facility] Cardiac and Pulmonary Rehab  Date  05/08/17  Educator  [facility]  Instruction Review Code  1- Verbalizes Understanding      Controlling Sodium/Reading Food Labels: -Group verbal and written material supporting the discussion of sodium use in heart healthy nutrition. Review and explanation with models, verbal and written materials for utilization of the food label.   Pulmonary Rehab from 06/14/2017 in [facility] Cardiac and Pulmonary Rehab  Date  05/15/17  Educator  CR  Instruction Review Code  1- Verbalizes Understanding      Exercise Physiology & General Exercise Guidelines: - Group verbal and written instruction with models to review the exercise physiology of the cardiovascular system and associated critical values. Provides general exercise guidelines with specific  guidelines to those with heart or lung disease.    Aerobic Exercise & Resistance Training: - Gives group verbal and written instruction on the various components of exercise. Focuses on aerobic and resistive training programs and the benefits of this training and how to safely progress through these programs.   Pulmonary Rehab from 06/14/2017 in [facility] Cardiac and Pulmonary Rehab  Date  04/28/17  Educator  [facility]  Instruction Review Code  1- Verbalizes Understanding      Flexibility, Balance, Mind/Body Relaxation: Provides group verbal/written instruction on the benefits of flexibility and balance training, including mind/body exercise modes such as yoga, pilates and tai chi.  Demonstration and skill practice provided.   Pulmonary Rehab from 06/14/2017 in [facility] Cardiac and Pulmonary Rehab  Date  05/24/17  Educator  [facility]  Instruction Review Code  1- Verbalizes Understanding      Stress and Anxiety: - Provides group verbal and written instruction about the health risks of elevated stress and causes of high stress.  Discuss the correlation between heart/lung disease and anxiety and treatment options. Review healthy ways to manage with stress and anxiety.   Pulmonary Rehab from 06/14/2017 in [facility] Cardiac and Pulmonary Rehab  Date  06/14/17  Educator  [place]  Instruction Review Code  1- Verbalizes Understanding      Depression: - Provides group verbal and written instruction on the correlation between heart/lung disease and depressed mood, treatment options, and the stigmas associated with seeking treatment.   Pulmonary Rehab from 06/14/2017 in [facility] Cardiac and Pulmonary Rehab  Date  05/31/17  Educator  [facility]  Instruction Review Code  1- Verbalizes Understanding      Exercise & Equipment Safety: - Individual verbal instruction and demonstration of equipment use and safety with use of the equipment.   Pulmonary Rehab from 06/14/2017 in [facility] Cardiac and Pulmonary Rehab  Date  04/18/17  Educator  [facility]   Instruction Review Code  1- Verbalizes Understanding      Infection Prevention: - Provides verbal and written material to individual with discussion of infection control including proper hand washing and proper equipment cleaning during exercise session.   Pulmonary Rehab from 06/14/2017 in [facility] Cardiac and Pulmonary Rehab  Date  04/18/17  Educator  [facility]  Instruction Review Code  1- Verbalizes Understanding      Falls Prevention: - Provides verbal and written material to individual with discussion of falls prevention and safety.   Pulmonary Rehab from 06/14/2017 in [facility] Cardiac and Pulmonary Rehab  Date  04/18/17  Educator  [facility]  Instruction Review Code  1- Verbalizes Understanding      Diabetes: - Individual verbal and written instruction to review signs/symptoms of diabetes, desired ranges of glucose level fasting, after meals and with exercise. Advice that pre and post exercise glucose checks will be done for 3 sessions at entry of program.   Chronic Lung Diseases: - Group verbal and written instruction to review updates, respiratory medications, advancements in procedures and treatments. Discuss use of supplemental oxygen including available portable oxygen systems, continuous and intermittent flow rates, concentrators, personal use and safety guidelines. Review proper use of inhaler and spacers. Provide informative websites for self-education.    Energy Conservation: - Provide group verbal and written instruction for methods to conserve energy, plan and organize activities. Instruct on pacing techniques, use of adaptive equipment and posture/positioning to relieve shortness of breath.   Pulmonary Rehab from 06/14/2017 in [facility] Cardiac and Pulmonary Rehab  Date  06/07/17  Educator  [facility]  Instruction Review Code  1- Verbalizes Understanding      Triggers and Exacerbations: - Group verbal and written instruction to review types of environmental triggers and ways to prevent  exacerbations. Discuss weather changes, air quality and the benefits of nasal washing. Review warning signs and symptoms to help prevent infections. Discuss techniques for effective airway clearance, coughing, and vibrations.   Pulmonary Rehab from 06/14/2017 in [facility] Cardiac and Pulmonary Rehab  Date  04/26/17  Educator  [facility]  Instruction Review Code  1- Verbalizes Understanding      AED/CPR: - Group verbal and written instruction with the use of models to demonstrate the basic use of the AED with the basic ABC's of resuscitation.   Pulmonary Rehab from 06/14/2017 in [facility] Cardiac and Pulmonary Rehab  Date  06/09/17  Educator  [facility]  Instruction Review Code  1- Actuary and Physiology of the Lungs: - Group verbal and written instruction with the use of models to provide basic lung anatomy and physiology related to function, structure and complications of lung disease.   Anatomy & Physiology of the Heart: - Group verbal and written instruction and models provide basic cardiac anatomy and physiology, with  the coronary electrical and arterial systems. Review of Valvular disease and Heart Failure   Pulmonary Rehab from 06/14/2017 in [facility] Cardiac and Pulmonary Rehab  Date  05/10/17  Educator  [facility]  Instruction Review Code  1- Verbalizes Understanding      Cardiac Medications: - Group verbal and written instruction to review commonly prescribed medications for heart disease. Reviews the medication, class of the drug, and side effects.   Know Your Numbers and Risk Factors: -Group verbal and written instruction about important numbers in your health.  Discussion of what are risk factors and how they play a role in the disease process.  Review of Cholesterol, Blood Pressure, Diabetes, and BMI and the role they play in your overall health.   Sleep Hygiene: -Provides group verbal and written instruction about how sleep can affect your health.  Define sleep hygiene, discuss  sleep cycles and impact of sleep habits. Review good sleep hygiene tips.    Other: -Provides group and verbal instruction on various topics (see comments)   Pulmonary Rehab from 06/14/2017 in [facility] Cardiac and Pulmonary Rehab  Date  05/03/17  Educator  [facility]  Instruction Review Code  1- Verbalizes Understanding [SLEEP]       Knowledge Questionnaire Score: Knowledge Questionnaire Score - 04/18/17 1126      Knowledge Questionnaire Score   Pre Score  13/18 reviewed with patient        Core Components/Risk Factors/Patient Goals at Admission: Personal Goals and Risk Factors at Admission - 04/18/17 1136      Core Components/Risk Factors/Patient Goals on Admission    Weight Management  Yes;Weight Maintenance    Intervention  Weight Management: Develop a combined nutrition and exercise program designed to reach desired caloric intake, while maintaining appropriate intake of nutrient and fiber, sodium and fats, and appropriate energy expenditure required for the weight goal.;Weight Management: Provide education and appropriate resources to help participant work on and attain dietary goals.;Weight Management/Obesity: Establish reasonable short term and long term weight goals.    Admit Weight  127 lb 9.6 oz (57.9 kg)    Goal Weight: Short Term  127 lb (57.6 kg)    Goal Weight: Long Term  127 lb (57.6 kg)    Expected Outcomes  Short Term: Continue to assess and modify interventions until short term weight is achieved;Long Term: Adherence to nutrition and physical activity/exercise program aimed toward attainment of established weight goal;Weight Maintenance: Understanding of the daily nutrition guidelines, which includes 25-35% calories from fat, 7% or less cal from saturated fats, less than 250m cholesterol, less than 1.5gm of sodium, & 5 or more servings of fruits and vegetables daily;Understanding recommendations for meals to include 15-35% energy as protein, 25-35% energy from fat, 35-60% energy  from carbohydrates, less than 2050mof dietary cholesterol, 20-35 gm of total fiber daily;Understanding of distribution of calorie intake throughout the day with the consumption of 4-5 meals/snacks    Improve shortness of breath with ADL's  Yes    Intervention  Provide education, individualized exercise plan and daily activity instruction to help decrease symptoms of SOB with activities of daily living.    Expected Outcomes  Short Term: Achieves a reduction of symptoms when performing activities of daily living.    Heart Failure  Yes    Intervention  Provide a combined exercise and nutrition program that is supplemented with education, support and counseling about heart failure. Directed toward relieving symptoms such as shortness of breath, decreased exercise tolerance, and extremity edema.  Expected Outcomes  Improve functional capacity of life;Short term: Attendance in program 2-3 days a week with increased exercise capacity. Reported lower sodium intake. Reported increased fruit and vegetable intake. Reports medication compliance.;Short term: Daily weights obtained and reported for increase. Utilizing diuretic protocols set by physician.;Long term: Adoption of self-care skills and reduction of barriers for early signs and symptoms recognition and intervention leading to self-care maintenance.    Hypertension  Yes takes medication     Intervention  Provide education on lifestyle modifcations including regular physical activity/exercise, weight management, moderate sodium restriction and increased consumption of fresh fruit, vegetables, and low fat dairy, alcohol moderation, and smoking cessation.;Monitor prescription use compliance.    Expected Outcomes  Short Term: Continued assessment and intervention until BP is < 140/106m HG in hypertensive participants. < 130/828mHG in hypertensive participants with diabetes, heart failure or chronic kidney disease.;Long Term: Maintenance of blood pressure at  goal levels.       Core Components/Risk Factors/Patient Goals Review:  Goals and Risk Factor Review    Row Name 05/17/17 1222 06/02/17 1149           Core Components/Risk Factors/Patient Goals Review   Personal Goals Review  Weight Management/Obesity;Improve shortness of breath with ADL's;Heart Failure;Hypertension;Stress  Weight Management/Obesity;Improve shortness of breath with ADL's;Heart Failure;Hypertension      Review  Mary Howe that her doctor wants her blood pressure to be below 14161or systolic. Her weight has been good and she is interested in maintaining it. 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 She does not notice herself getting short of breath at home as much.  Mary Howe been doing well in rehab.  SOB at home is getting better and she is able to do more at home.   Her blood pressures have been  good and she is checking them at home.   Usually gets them in the 120-130s.  Her weight has been stable as well.  She has not had any heart failure sysmptoms.       Expected Outcomes  Short: attend [facility] to improve ADL's.  Long: maintain exercise to reduce stress and Improve ADL.  Short: Continue to work on improving SOB for ADLs.  Long: Continue to monitor blood pressures regularly.          Core Components/Risk Factors/Patient Goals at Discharge (Final Review):  Goals and Risk Factor Review - 06/02/17 1149      Core Components/Risk Factors/Patient Goals Review   Personal Goals Review  Weight Management/Obesity;Improve shortness of breath with ADL's;Heart Failure;Hypertension    Review  Mary Howe been doing well in rehab.  SOB at home is getting better and she is able to do more at home.   Her blood pressures have been  good and she is checking them at home.   Usually gets them in the 120-130s.  Her weight has been stable as well.  She has not had any heart failure sysmptoms.     Expected Outcomes  Short: Continue to work on improving  SOB for ADLs.  Long: Continue to monitor blood pressures regularly.        ITP Comments: ITP Comments    Row Name 04/18/17 1058 04/24/17 0835 05/22/17 0934 06/19/17 0836     ITP Comments  Medical Evaluation completed. Chart sent for review and changes to Dr. MaEmily Filbertirector of [place] can be found in [facility] encounter 04/18/17  30 day review completed. ITP sent to Dr.  Emily Filbert Director of [place]. Continue with ITP unless changes are made by physician.  30 day review completed. ITP sent to Dr. Emily Filbert Director of [place]. Continue with ITP unless changes are made by physician.  30 day review completed. ITP sent to Dr. Emily Filbert Director of [place]. Continue with ITP unless changes are made by physician.       Comments: 30 day review

## 2017-06-21 ENCOUNTER — Encounter: Payer: Medicare Other | Admitting: *Deleted

## 2017-06-21 DIAGNOSIS — J432 Centrilobular emphysema: Secondary | ICD-10-CM | POA: Diagnosis not present

## 2017-06-21 DIAGNOSIS — I5022 Chronic systolic (congestive) heart failure: Secondary | ICD-10-CM | POA: Diagnosis not present

## 2017-06-21 NOTE — Progress Notes (Signed)
Daily Session Note  Patient Details  Name: Mary Howe MRN: [id number] Date of Birth: [birth year] Referring Provider:     Pulmonary Rehab from 04/18/2017 in [facility] Cardiac and Pulmonary Rehab  Referring Provider  [place]      Encounter Date: 06/21/2017  Check In: Session Check In - 06/21/17 1123      Check-In   Location  [facility]-Cardiac & Pulmonary Rehab    Staff Present  Renita Papa, RN BSN;Jessica Luan Pulling, [place], RCEP, CCRP, Exercise Physiologist;Joseph Flavia Shipper    Supervising physician immediately available to respond to emergencies  [facility] immediately available ER MD    Physician(s)  Dr. Reita Cliche and Alfred Levins    Medication changes reported      No    Fall or balance concerns reported     No    Warm-up and Cool-down  Performed as group-led instruction    Resistance Training Performed  Yes    VAD Patient?  No      Pain Assessment   Currently in Pain?  No/denies        Exercise Prescription Changes - 06/20/17 1400      Response to Exercise   Blood Pressure (Admit)  122/72    Blood Pressure (Exit)  102/50    Heart Rate (Admit)  69 bpm    Heart Rate (Exercise)  101 bpm    Heart Rate (Exit)  74 bpm    Oxygen Saturation (Admit)  96 %    Oxygen Saturation (Exercise)  89 %    Oxygen Saturation (Exit)  98 %    Rating of Perceived Exertion (Exercise)  12    Perceived Dyspnea (Exercise)  1    Symptoms  none    Duration  Continue with 45 min of aerobic exercise without signs/symptoms of physical distress.    Intensity  THRR unchanged      Progression   Progression  Continue to progress workloads to maintain intensity without signs/symptoms of physical distress.    Average METs  4.7      Resistance Training   Training Prescription  Yes    Weight  5 lbs    Reps  10-15      Interval Training   Interval Training  No      Treadmill   MPH  2.7    Grade  2    Minutes  15    METs  3.81      NuStep   Level  4    SPM  87    Minutes  15    METs  3.6      REL-XR   Level  3    Minutes  15    METs  6.7      Home Exercise Plan   Plans to continue exercise at  Home (comment) walking    Frequency  Add 2 additional days to program exercise sessions.    Initial Home Exercises Provided  05/10/17       Social History   Tobacco Use  Smoking Status Former Smoker  . Packs/day: 1.50  . Years: 29.00  . Pack years: 43.50  . Types: Cigarettes  . Last attempt to quit: 04/11/1988  . Years since quitting: 29.2  Smokeless Tobacco Never Used  Tobacco Comment   quit smoking in 1990    Goals Met:  Proper associated with RPD/PD & O2 Sat Independence with exercise equipment Using PLB without cueing & demonstrates good technique Exercise tolerated well Strength training completed today  Goals  Unmet:  Not Applicable  Comments: Pt able to follow exercise prescription today without complaint.  Will continue to monitor for progression.    Dr. Emily Filbert is Medical Director for [place] and [facility].

## 2017-06-26 DIAGNOSIS — J432 Centrilobular emphysema: Secondary | ICD-10-CM

## 2017-06-26 DIAGNOSIS — I5022 Chronic systolic (congestive) heart failure: Secondary | ICD-10-CM | POA: Diagnosis not present

## 2017-06-26 NOTE — Progress Notes (Signed)
Daily Session Note  Patient Details  Name: Mary Howe MRN: [id number] Date of Birth: [birth year] Referring Provider:     Pulmonary Rehab from 04/18/2017 in [facility] Cardiac and Pulmonary Rehab  Referring Provider  [place]      Encounter Date: 06/26/2017  Check In: Session Check In - 06/26/17 1128      Check-In   Location  [facility]-Cardiac & Pulmonary Rehab    Staff Present  Earlean Shawl, BS, ACSM CEP, Exercise Physiologist;Amanda Oletta Darter, BA, ACSM CEP, Exercise Physiologist;Brody Bonneau Flavia Shipper    Supervising physician immediately available to respond to emergencies  [facility] immediately available ER MD    Physician(s)   Dr. Burlene Arnt and Corky Downs    Medication changes reported      No    Fall or balance concerns reported     No    Tobacco Cessation  No Change    Warm-up and Cool-down  Performed as group-led instruction    Resistance Training Performed  Yes    VAD Patient?  No      Pain Assessment   Currently in Pain?  No/denies          Social History   Tobacco Use  Smoking Status Former Smoker  . Packs/day: 1.50  . Years: 29.00  . Pack years: 43.50  . Types: Cigarettes  . Last attempt to quit: 04/11/1988  . Years since quitting: 29.2  Smokeless Tobacco Never Used  Tobacco Comment   quit smoking in 1990    Goals Met:  Independence with exercise equipment Exercise tolerated well No report of cardiac concerns or symptoms Strength training completed today  Goals Unmet:  Not Applicable  Comments: Pt able to follow exercise prescription today without complaint.  Will continue to monitor for progression.   Dr. Emily Filbert is Medical Director for [place] and [facility].

## 2017-06-28 DIAGNOSIS — J432 Centrilobular emphysema: Secondary | ICD-10-CM | POA: Diagnosis not present

## 2017-06-28 DIAGNOSIS — I5022 Chronic systolic (congestive) heart failure: Secondary | ICD-10-CM | POA: Diagnosis not present

## 2017-06-28 NOTE — Progress Notes (Signed)
Daily Session Note  Patient Details  Name: Mary Howe MRN: [id number] Date of Birth: 1932/12/30 Referring Provider:     Pulmonary Rehab from 04/18/2017 in [facility] Cardiac and Pulmonary Rehab  Referring Provider  Tullo      Encounter Date: 06/28/2017  Check In: Session Check In - 06/28/17 1117      Check-In   Location  [facility]Cardiac & Pulmonary Rehab    Staff Present  Renita Papa, RN BSN;Joseph Darrin Nipper, [place], RCEP, [place], Exercise Physiologist    Supervising physician immediately available to respond to emergencies  [facility] immediately available ER MD    Physician(s)  Dr. Joni Fears and Jimmye Norman    Medication changes reported      No    Fall or balance concerns reported     No    Tobacco Cessation  No Change    Warm-up and Cool-down  Performed as group-led instruction    Resistance Training Performed  Yes    VAD Patient?  No      Pain Assessment   Currently in Pain?  No/denies          Social History   Tobacco Use  Smoking Status Former Smoker  . Packs/day: 1.50  . Years: 29.00  . Pack years: 43.50  . Types: Cigarettes  . Last attempt to quit: 04/11/1988  . Years since quitting: 29.2  Smokeless Tobacco Never Used  Tobacco Comment   quit smoking in 1990    Goals Met:  Independence with exercise equipment Exercise tolerated well No report of cardiac concerns or symptoms Strength training completed today  Goals Unmet:  Not Applicable  Comments: Pt able to follow exercise prescription today without complaint.  Will continue to monitor for progression.   Dr. Emily Filbert is Medical Director for [place] and [facility].

## 2017-06-30 ENCOUNTER — Encounter: Payer: Medicare Other | Admitting: *Deleted

## 2017-06-30 DIAGNOSIS — J432 Centrilobular emphysema: Secondary | ICD-10-CM

## 2017-06-30 DIAGNOSIS — I5022 Chronic systolic (congestive) heart failure: Secondary | ICD-10-CM | POA: Diagnosis not present

## 2017-06-30 NOTE — Progress Notes (Signed)
Daily Session Note  Patient Details  Name: Mary Howe MRN: [id number] Date of Birth: 29-Mar-1933 Referring Provider:     Pulmonary Rehab from 04/18/2017 in [facility] Cardiac and Pulmonary Rehab  Referring Provider  Tullo      Encounter Date: 06/30/2017  Check In: Session Check In - 06/30/17 1127      Check-In   Location  [facility]-Cardiac & Pulmonary Rehab    Staff Present  Alberteen Sam, MA, RCEP, CCRP, Exercise Physiologist;Meredith Sherryll Burger, RN BSN;Joseph Flavia Shipper    Supervising physician immediately available to respond to emergencies  [facility] immediately available ER MD    Physician(s)  Drs. Joni Fears and [place]    Medication changes reported      No    Fall or balance concerns reported     No    Warm-up and Cool-down  Performed as group-led Higher education careers adviser Performed  Yes    VAD Patient?  No      Pain Assessment   Currently in Pain?  No/denies          Social History   Tobacco Use  Smoking Status Former Smoker  . Packs/day: 1.50  . Years: 29.00  . Pack years: 43.50  . Types: Cigarettes  . Last attempt to quit: 04/11/1988  . Years since quitting: 29.2  Smokeless Tobacco Never Used  Tobacco Comment   quit smoking in 1990    Goals Met:  Proper associated with RPD/PD & O2 Sat Independence with exercise equipment Using PLB without cueing & demonstrates good technique Exercise tolerated well No report of cardiac concerns or symptoms Strength training completed today  Goals Unmet:  Not Applicable  Comments: Pt able to follow exercise prescription today without complaint.  Will continue to monitor for progression.    Dr. Emily Filbert is Medical Director for [place] and [facility].

## 2017-07-03 ENCOUNTER — Encounter: Payer: Self-pay | Admitting: Family Medicine

## 2017-07-03 ENCOUNTER — Ambulatory Visit (INDEPENDENT_AMBULATORY_CARE_PROVIDER_SITE_OTHER): Payer: Medicare Other | Admitting: Family Medicine

## 2017-07-03 VITALS — BP 100/50 | HR 78 | Temp 98.1°F | Ht 64.0 in | Wt 129.1 lb

## 2017-07-03 DIAGNOSIS — I5022 Chronic systolic (congestive) heart failure: Secondary | ICD-10-CM | POA: Diagnosis not present

## 2017-07-03 DIAGNOSIS — R21 Rash and other nonspecific skin eruption: Secondary | ICD-10-CM | POA: Diagnosis not present

## 2017-07-03 DIAGNOSIS — J432 Centrilobular emphysema: Secondary | ICD-10-CM

## 2017-07-03 MED ORDER — ACYCLOVIR 400 MG PO TABS: 400.0000 mg | ORAL_TABLET | Freq: Every day | ORAL | 0 refills | Status: DC

## 2017-07-03 NOTE — Patient Instructions (Signed)
Please take medication as directed and follow up for further evaluation if symptoms do not improve with treatment.   Your rash appears to be early shingles, start acyclovir today.   Shingles Shingles is an infection that causes a painful skin rash and fluid-filled blisters. Shingles is caused by the same virus that causes chickenpox. Shingles only develops in people who:  Have had chickenpox.  Have gotten the chickenpox vaccine. (This is rare.)  The first symptoms of shingles may be itching, tingling, or pain in an area on your skin. A rash will follow in a few days or weeks. The rash is usually on one side of the body in a bandlike or beltlike pattern. Over time, the rash turns into fluid-filled blisters that break open, scab over, and dry up. Medicines may:  Help you manage pain.  Help you recover more quickly.  Help to prevent long-term problems.  Follow these instructions at home: Medicines  Take medicines only as told by your doctor.  Apply an anti-itch or numbing cream to the affected area as told by your doctor. Blister and Rash Care  Take a cool bath or put cool compresses on the area of the rash or blisters as told by your doctor. This may help with pain and itching.  Keep your rash covered with a loose bandage (dressing). Wear loose-fitting clothing.  Keep your rash and blisters clean with mild soap and cool water or as told by your doctor.  Check your rash every day for signs of infection. These include redness, swelling, and pain that lasts or gets worse.  Do not pick your blisters.  Do not scratch your rash. General instructions  Rest as told by your doctor.  Keep all follow-up visits as told by your doctor. This is important.  Until your blisters scab over, your infection can cause chickenpox in people who have never had it or been vaccinated against it. To prevent this from happening, avoid touching other people or being around other people,  especially: ? Babies. ? Pregnant women. ? Children who have eczema. ? Elderly people who have transplants. ? People who have chronic illnesses, such as leukemia or AIDS. Contact a doctor if:  Your pain does not get better with medicine.  Your pain does not get better after the rash heals.  Your rash looks infected. Signs of infection include: ? Redness. ? Swelling. ? Pain that lasts or gets worse. Get help right away if:  The rash is on your face or nose.  You have pain in your face, pain around your eye area, or loss of feeling on one side of your face.  You have ear pain or you have ringing in your ear.  You have loss of taste.  Your condition gets worse. This information is not intended to replace advice given to you by your health care provider. Make sure you discuss any questions you have with your health care provider. Document Released: 09/14/2007 Document Revised: 11/22/2015 Document Reviewed: 01/07/2014 Elsevier Interactive Patient Education  [employer].

## 2017-07-03 NOTE — Progress Notes (Signed)
Daily Session Note  Patient Details  Name: Mary Howe MRN: [id number] Date of Birth: 10-02-1932 Referring Provider:     Pulmonary Rehab from 04/18/2017 in [facility] Cardiac and Pulmonary Rehab  Referring Provider  Tullo      Encounter Date: 07/03/2017  Check In: Session Check In - 07/03/17 1117      Check-In   Location  [facility]-Cardiac & Pulmonary Rehab    Staff Present  Earlean Shawl, BS, ACSM CEP, Exercise Physiologist;Amanda Oletta Darter, BA, ACSM CEP, Exercise Physiologist;Odis Turck Flavia Shipper    Supervising physician immediately available to respond to emergencies  [facility] immediately available ER MD    Physician(s)  Dr. Alfred Levins and Siadecki    Medication changes reported      No    Fall or balance concerns reported     No    Tobacco Cessation  No Change    Warm-up and Cool-down  Performed as group-led instruction    Resistance Training Performed  Yes    VAD Patient?  No      Pain Assessment   Currently in Pain?  No/denies          Social History   Tobacco Use  Smoking Status Former Smoker  . Packs/day: 1.50  . Years: 29.00  . Pack years: 43.50  . Types: Cigarettes  . Last attempt to quit: 04/11/1988  . Years since quitting: 29.2  Smokeless Tobacco Never Used  Tobacco Comment   quit smoking in 1990    Goals Met:  Independence with exercise equipment Exercise tolerated well No report of cardiac concerns or symptoms Strength training completed today  Goals Unmet:  Not Applicable  Comments: Pt able to follow exercise prescription today without complaint.  Will continue to monitor for progression.   Dr. Emily Filbert is Medical Director for [place] and [facility].

## 2017-07-03 NOTE — Progress Notes (Signed)
Patient ID: Mary Howe, female   DOB: 29-Dec-1932, 84 y.o.   MRN: [id number]   PCP: [name], MD  Subjective:  Mary Howe is a 84 y.o. year old very pleasant female patient who presents with a Rash: Initial distribution: upper back Prior history of this rash: No Discomfort associated: No Associated symptoms:  Pruritus, mild burning Change in rash: Rash has expanded on back and described at first as "reddness and now bumps" Denies:  Fever, chills, sweats, myalgias, difficulty swallowing, hoarseness, SOB, N/V Abdominal pain, or tightening throat. No new exposures of soaps, lotions, laundry detergent, fabric softeners, foods, medications, herbal supplements, STDs, plants, animals, or insects.  -started: 1 week ago, symptoms are not improving  -previous treatments: Aveeno oatmeal and lotion that has provided limited benefit -sick contacts/travel/risks: denies flu exposure: No recent sick contact exposure. -Hx of: allergies  ROS-denies fever, SOB, NVD,   Pertinent Past Medical History- HTN, Acute systolic HF, prior episodes of shingles  Medications- reviewed  Current Outpatient Medications  Medication Sig Dispense Refill  . acetaminophen (TYLENOL) 500 MG tablet Take 500 mg by mouth every 6 (six) hours as needed.    Marland Kitchen acyclovir (ZOVIRAX) 400 MG tablet Take 1 tablet (400 mg total) by mouth 5 (five) times daily. 35 tablet 0  . albuterol (PROVENTIL HFA;VENTOLIN HFA) 108 (90 Base) MCG/ACT inhaler Inhale 1-2 puffs into the lungs every 4 (four) hours as needed for wheezing or shortness of breath. 1 Inhaler 10  . alendronate (FOSAMAX) 70 MG tablet Take 1 tablet (70 mg total) by mouth every 7 (seven) days. Take with a full glass of water on an empty stomach. 4 tablet 11  . benzocaine (ORAJEL) 10 % mucosal gel Use as directed in the mouth or throat 3 (three) times daily as needed for mouth pain. 5.3 g 0  . bisoprolol (ZEBETA) 5 MG tablet Take 0.5 tablets (2.5 mg total) by mouth daily. 45  tablet 1  . calcium carbonate (OS-CAL) 600 MG TABS tablet Take 600 mg by mouth daily with breakfast.     . cetirizine (ZYRTEC) 10 MG tablet Take 10 mg by mouth daily.    . diphenhydrAMINE (BENADRYL) 25 MG tablet Take 25 mg by mouth at bedtime as needed.    Marland Kitchen escitalopram (LEXAPRO) 10 MG tablet TAKE 1 TABLET(10 MG) BY MOUTH DAILY 30 tablet 5  . furosemide (LASIX) 20 MG tablet Take 1 tablet (20 mg total) by mouth 2 (two) times daily. 60 tablet 11  . guaiFENesin (ROBITUSSIN) 100 MG/5ML SOLN Take 10 mLs (200 mg total) by mouth every 4 (four) hours as needed for cough or to loosen phlegm. 1200 mL 0  . lidocaine (LIDODERM) 5 % Place 1 patch onto the skin daily. Remove & Discard patch within 12 hours or as directed by MD 30 patch 0  . MULTIPLE VITAMIN PO Take 1 tablet by mouth daily.     . OXYGEN Inhale 2.5 L daily into the lungs.    . pantoprazole (PROTONIX) 40 MG tablet TAKE 1 TABLET(40 MG) BY MOUTH DAILY 90 tablet 0  . valACYclovir (VALTREX) 1000 MG tablet Take 1,000 mg by mouth 2 (two) times daily.     . budesonide (PULMICORT) 0.5 MG/2ML nebulizer solution Take 2 mLs (0.5 mg total) by nebulization 2 (two) times daily. 120 mL 11   No current facility-administered medications for this visit.     Objective: BP (!) 100/50 (BP Location: Left Arm, Patient Position: Sitting, Cuff Size: Normal)   Pulse 78  Temp 98.1 F (36.7 C) (Oral)   Ht 5\' 4"  (1.626 m)   Wt 129 lb 1.9 oz (58.6 kg)   BMI 22.16 kg/m  Gen: NAD, resting comfortably HEENT: Oropharynx is clear and moist CV: RRR no murmurs rubs or gallops Lungs: CTAB no crackles, wheeze, rhonchi Abdomen: soft/nontender/nondistended/normal bowel sounds. No rebound or guarding.  Ext: no edema Skin: warm, dry, erythematous papules grouped on her left upper back along thoracic (T1, T2) dermatome. One papule crosses the midline otherwise rash is located unilaterally on the left. Neuro: grossly normal, moves all extremities  Assessment/Plan: 1.  Rash - acyclovir (ZOVIRAX) 400 MG tablet; Take 1 tablet (400 mg total) by mouth 5 (five) times daily.  Dispense: 35 tablet; Refill: 0  History and exam today are suggestive of early presentation of shingles. Will treat with acyclovir and advised follow up if rash worsens or if she notices any new distribution of rash particularly on her face. She denies any concern with pain stating this is mild and she is interested in sole treatment of antiviral medication at this time.  Finally, we reviewed reasons to return to care including if symptoms worsen or persist or new concerns arise- once again particularly rash that does not improve,  shortness of breath, N/V, or fever. While this appears as early development of shingles, we reviewed importance of calling 911immediately if symptoms of SOB, difficulty swallowing, or throat tightening with a rash ever occurs.    Mary Quint, FNP

## 2017-07-04 ENCOUNTER — Other Ambulatory Visit: Payer: Self-pay | Admitting: Internal Medicine

## 2017-07-04 DIAGNOSIS — K219 Gastro-esophageal reflux disease without esophagitis: Secondary | ICD-10-CM

## 2017-07-05 ENCOUNTER — Encounter: Payer: Medicare Other | Admitting: *Deleted

## 2017-07-05 DIAGNOSIS — J432 Centrilobular emphysema: Secondary | ICD-10-CM | POA: Diagnosis not present

## 2017-07-05 DIAGNOSIS — I5022 Chronic systolic (congestive) heart failure: Secondary | ICD-10-CM | POA: Diagnosis not present

## 2017-07-05 NOTE — Progress Notes (Signed)
Daily Session Note  Patient Details  Name: Meridee Branum MRN: [id number] Date of Birth: [birth year] Referring Provider:     Pulmonary Rehab from 04/18/2017 in [facility] Cardiac and Pulmonary Rehab  Referring Provider  Tullo      Encounter Date: 07/05/2017  Check In: Session Check In - 07/05/17 1126      Check-In   Location  [facility]-Cardiac & Pulmonary Rehab    Staff Present  Renita Papa, RN BSN;Jessica Luan Pulling, MA, RCEP, CCRP, Exercise Physiologist;Joseph Flavia Shipper    Supervising physician immediately available to respond to emergencies  [facility] immediately available ER MD    Physician(s)  Dr. Mariea Clonts and Jimmye Norman     Medication changes reported      No    Fall or balance concerns reported     No    Warm-up and Cool-down  Performed as group-led instruction    Resistance Training Performed  Yes    VAD Patient?  No      Pain Assessment   Currently in Pain?  No/denies          Social History   Tobacco Use  Smoking Status Former Smoker  . Packs/day: 1.50  . Years: 29.00  . Pack years: 43.50  . Types: Cigarettes  . Last attempt to quit: 04/11/1988  . Years since quitting: 29.2  Smokeless Tobacco Never Used  Tobacco Comment   quit smoking in 1990    Goals Met:  Proper associated with RPD/PD & O2 Sat Independence with exercise equipment Using PLB without cueing & demonstrates good technique Exercise tolerated well Strength training completed today  Goals Unmet:  Not Applicable  Comments: Pt able to follow exercise prescription today without complaint.  Will continue to monitor for progression.    Dr. Emily Filbert is Medical Director for [place] and [facility].

## 2017-07-07 ENCOUNTER — Encounter: Payer: Medicare Other | Admitting: *Deleted

## 2017-07-07 DIAGNOSIS — I5022 Chronic systolic (congestive) heart failure: Secondary | ICD-10-CM | POA: Diagnosis not present

## 2017-07-07 DIAGNOSIS — J432 Centrilobular emphysema: Secondary | ICD-10-CM | POA: Diagnosis not present

## 2017-07-07 NOTE — Progress Notes (Signed)
Daily Session Note  Patient Details  Name: Mary Howe MRN: [id number] Date of Birth: January 04, 1933 Referring Provider:     Pulmonary Rehab from 04/18/2017 in [facility] Cardiac and Pulmonary Rehab  Referring Provider  Tullo      Encounter Date: 07/07/2017  Check In: Session Check In - 07/07/17 1133      Check-In   Location  [facility]Cardiac [facility] Pulmonary Rehab    Staff Present  Renita Papa, RN BSN;Mary Kellie Shropshire, RN, BSN, [place]lette Pa, MA, RCEP, CCRP, Exercise Physiologist    Supervising physician immediately available to respond to emergencies  [facility] immediately available ER MD    Physician(s)  Dr. Reita Cliche and Jacqualine Code    Medication changes reported      No    Fall or balance concerns reported     No    Warm-up and Cool-down  Performed as group-led instruction    Resistance Training Performed  Yes    VAD Patient?  No      Pain Assessment   Currently in Pain?  No/denies          Social History   Tobacco Use  Smoking Status Former Smoker  . Packs/day: 1.50  . Years: 29.00  . Pack years: 43.50  . Types: Cigarettes  . Last attempt to quit: 04/11/1988  . Years since quitting: 29.2  Smokeless Tobacco Never Used  Tobacco Comment   quit smoking in 1990    Goals Met:  Proper associated with RPD/PD & O2 Sat Independence with exercise equipment Using PLB without cueing & demonstrates good technique Exercise tolerated well Strength training completed today  Goals Unmet:  Not Applicable  Comments: Pt able to follow exercise prescription today without complaint.  Will continue to monitor for progression.    Dr. Emily Filbert is Medical Director for [place] and [facility].

## 2017-07-10 ENCOUNTER — Encounter: Payer: Medicare Other | Attending: Internal Medicine

## 2017-07-10 DIAGNOSIS — J432 Centrilobular emphysema: Secondary | ICD-10-CM | POA: Insufficient documentation

## 2017-07-10 DIAGNOSIS — I5022 Chronic systolic (congestive) heart failure: Secondary | ICD-10-CM | POA: Diagnosis not present

## 2017-07-10 NOTE — Progress Notes (Signed)
Daily Session Note  Patient Details  Name: Mary Howe MRN: [id number] Date of Birth: 04/20/1932 Referring Provider:     Pulmonary Rehab from 04/18/2017 in [facility] Cardiac and Pulmonary Rehab  Referring Provider  Tullo      Encounter Date: 07/10/2017  Check In: Session Check In - 07/10/17 1138      Check-In   Location  [facility]Cardiac [facility] Pulmonary Rehab    Staff Present  Earlean Shawl, BS, ACSM CEP, Exercise Physiologist;Amanda Oletta Darter, BA, ACSM CEP, Exercise Physiologist;Joseph Flavia Shipper    Supervising physician immediately available to respond to emergencies  [facility] immediately available ER MD    Physician(s)  Dr. Jimmye Norman and Corky Downs    Medication changes reported      No    Fall or balance concerns reported     No    Tobacco Cessation  No Change    Warm-up and Cool-down  Performed as group-led instruction    Resistance Training Performed  Yes    VAD Patient?  No      Pain Assessment   Currently in Pain?  No/denies          Social History   Tobacco Use  Smoking Status Former Smoker  . Packs/day: 1.50  . Years: 29.00  . Pack years: 43.50  . Types: Cigarettes  . Last attempt to quit: 04/11/1988  . Years since quitting: 29.2  Smokeless Tobacco Never Used  Tobacco Comment   quit smoking in 1990    Goals Met:  Independence with exercise equipment Exercise tolerated well No report of cardiac concerns or symptoms Strength training completed today  Goals Unmet:  Not Applicable  Comments: Pt able to follow exercise prescription today without complaint.  Will continue to monitor for progression.   Dr. Emily Filbert is Medical Director for [place] and [facility].

## 2017-07-12 DIAGNOSIS — J432 Centrilobular emphysema: Secondary | ICD-10-CM | POA: Diagnosis not present

## 2017-07-12 DIAGNOSIS — I5022 Chronic systolic (congestive) heart failure: Secondary | ICD-10-CM | POA: Diagnosis not present

## 2017-07-12 NOTE — Progress Notes (Signed)
Daily Session Note  Patient Details  Name: Mary Howe MRN: [id number] Date of Birth: 06-Oct-1932 Referring Provider:     Pulmonary Rehab from 04/18/2017 in [facility] Cardiac and Pulmonary Rehab  Referring Provider  [place]      Encounter Date: 07/12/2017  Check In: Session Check In - 07/12/17 1137      Check-In   Location  [facility]-Cardiac & Pulmonary Rehab    Staff Present  Justin Mend RCP,RRT,BSRT;Meredith Sherryll Burger, RN BSN;Jessica Luan Pulling, MA, RCEP, CCRP, Exercise Physiologist    Supervising physician immediately available to respond to emergencies  [facility] immediately available ER MD    Physician(s)  Dr. Mable Paris and Jimmye Norman    Medication changes reported      No    Fall or balance concerns reported     No    Tobacco Cessation  No Change    Warm-up and Cool-down  Performed as group-led instruction    Resistance Training Performed  Yes    VAD Patient?  No      Pain Assessment   Currently in Pain?  No/denies          Social History   Tobacco Use  Smoking Status Former Smoker  . Packs/day: 1.50  . Years: 29.00  . Pack years: 43.50  . Types: Cigarettes  . Last attempt to quit: 04/11/1988  . Years since quitting: 29.2  Smokeless Tobacco Never Used  Tobacco Comment   quit smoking in 1990    Goals Met:  Independence with exercise equipment Exercise tolerated well No report of cardiac concerns or symptoms Strength training completed today  Goals Unmet:  Not Applicable  Comments: Pt able to follow exercise prescription today without complaint.  Will continue to monitor for progression. [place] Name 04/18/17 1227 07/12/17 1156       6 Minute Walk   Phase  -  Discharge    Distance  1040 feet  1430 feet    Distance % Change  -  37.5 %    Distance Feet Change  -  390 ft    Walk Time  6 minutes  6 minutes    # of Rest Breaks  0  0    MPH  1.97  2.71    METS  1.98  2.78    RPE  10  13    Perceived Dyspnea   1  1    VO2 Peak  6.94  9.74    Symptoms  No   No    Resting HR  78 bpm  73 bpm    Resting BP  134/64  118/62    Resting Oxygen Saturation   95 %  95 %    Exercise Oxygen Saturation  during 6 min walk  89 %  84 %    Max Ex. HR  96 bpm  109 bpm    Max Ex. BP  130/60  134/62    2 Minute Post BP  120/60  -      Interval HR   1 Minute HR  82  88    2 Minute HR  79  90    3 Minute HR  -  108    4 Minute HR  86  109    5 Minute HR  92  109    6 Minute HR  96  108    2 Minute Post HR  74  -    Interval Heart Rate?  Yes  Yes      Interval Oxygen   Interval Oxygen?  Yes  Yes    Baseline Oxygen Saturation %  95 %  95 %    1 Minute Oxygen Saturation %  94 %  88 %    1 Minute Liters of Oxygen  0 L  0 L Room Air    2 Minute Oxygen Saturation %  89 %  89 %    2 Minute Liters of Oxygen  0 L  0 L    3 Minute Oxygen Saturation %  -  84 %    3 Minute Liters of Oxygen  0 L  0 L    4 Minute Oxygen Saturation %  94 %  91 %    4 Minute Liters of Oxygen  0 L  0 L    5 Minute Oxygen Saturation %  93 %  90 %    5 Minute Liters of Oxygen  0 L  0 L    6 Minute Oxygen Saturation %  91 %  90 %    6 Minute Liters of Oxygen  0 L  0 L    2 Minute Post Oxygen Saturation %  95 %  -    2 Minute Post Liters of Oxygen  0 L  -        Dr. Emily Filbert is Medical Director for [place] and [facility].

## 2017-07-14 ENCOUNTER — Encounter: Payer: Medicare Other | Admitting: *Deleted

## 2017-07-14 ENCOUNTER — Encounter: Payer: Self-pay | Admitting: *Deleted

## 2017-07-14 DIAGNOSIS — I5022 Chronic systolic (congestive) heart failure: Secondary | ICD-10-CM | POA: Diagnosis not present

## 2017-07-14 DIAGNOSIS — J432 Centrilobular emphysema: Secondary | ICD-10-CM

## 2017-07-14 NOTE — Progress Notes (Signed)
Daily Session Note  Patient Details  Name: Mary Howe MRN: [id number] Date of Birth: 1932/09/18 Referring Provider:     Pulmonary Rehab from 04/18/2017 in [facility] Cardiac and Pulmonary Rehab  Referring Provider  [place]      Encounter Date: 07/14/2017  Check In: Session Check In - 07/14/17 1254      Check-In   Location  [facility]-Cardiac & Pulmonary Rehab    Staff Present  Nyoka Cowden, RN, BSN, MA;Sanaia Jasso, RN, BSN;Mandi Ballard, BS, [facility]    Supervising physician immediately available to respond to emergencies  [facility] immediately available ER MD    Physician(s)  Dr. Mariea Clonts and Dr. Mable Paris    Medication changes reported      No    Fall or balance concerns reported     No    Tobacco Cessation  No Change    Warm-up and Cool-down  Performed as group-led instruction    Resistance Training Performed  Yes    VAD Patient?  No      Pain Assessment   Currently in Pain?  No/denies          Social History   Tobacco Use  Smoking Status Former Smoker  . Packs/day: 1.50  . Years: 29.00  . Pack years: 43.50  . Types: Cigarettes  . Last attempt to quit: 04/11/1988  . Years since quitting: 29.2  Smokeless Tobacco Never Used  Tobacco Comment   quit smoking in 1990    Goals Met:  Proper associated with RPD/PD & O2 Sat Exercise tolerated well No report of cardiac concerns or symptoms Strength training completed today  Goals Unmet:  Not Applicable  Comments:     Dr. Emily Filbert is Medical Director for [place] and [facility].

## 2017-07-17 DIAGNOSIS — J432 Centrilobular emphysema: Secondary | ICD-10-CM

## 2017-07-17 DIAGNOSIS — I5022 Chronic systolic (congestive) heart failure: Secondary | ICD-10-CM | POA: Diagnosis not present

## 2017-07-17 NOTE — Progress Notes (Signed)
Pulmonary Individual Treatment Plan  Howe Details  Name: Mary Howe MRN: [id number] Date of Birth: [birth year] Referring Provider:     Pulmonary Rehab from 04/18/2017 in [facility] Cardiac and Pulmonary Rehab  Referring Provider  Derrel Nip      Initial Encounter Date:    Pulmonary Rehab from 04/18/2017 in [facility] Cardiac and Pulmonary Rehab  Date  04/18/17  Referring Provider  Derrel Nip      Visit Diagnosis: Centrilobular emphysema ([place])  Howe's Home Medications on Admission:  Current Outpatient Medications:  .  acetaminophen (TYLENOL) 500 MG tablet, Take 500 mg by mouth every 6 (six) hours as needed., Disp: , Rfl:  .  acyclovir (ZOVIRAX) 400 MG tablet, Take 1 tablet (400 mg total) by mouth 5 (five) times daily., Disp: 35 tablet, Rfl: 0 .  albuterol (PROVENTIL HFA;VENTOLIN HFA) 108 (90 Base) MCG/ACT inhaler, Inhale 1-2 puffs into Mary lungs every 4 (four) hours as needed for wheezing or shortness of breath., Disp: 1 Inhaler, Rfl: 10 .  alendronate (FOSAMAX) 70 MG tablet, Take 1 tablet (70 mg total) by mouth every 7 (seven) days. Take with a full glass of water on an empty stomach., Disp: 4 tablet, Rfl: 11 .  benzocaine (ORAJEL) 10 % mucosal gel, Use as directed in Mary mouth or throat 3 (three) times daily as needed for mouth pain., Disp: 5.3 g, Rfl: 0 .  bisoprolol (ZEBETA) 5 MG tablet, Take 0.5 tablets (2.5 mg total) by mouth daily., Disp: 45 tablet, Rfl: 1 .  budesonide (PULMICORT) 0.5 MG/2ML nebulizer solution, Take 2 mLs (0.5 mg total) by nebulization 2 (two) times daily., Disp: 120 mL, Rfl: 11 .  calcium carbonate (OS-CAL) 600 MG TABS tablet, Take 600 mg by mouth daily with breakfast. , Disp: , Rfl:  .  cetirizine (ZYRTEC) 10 MG tablet, Take 10 mg by mouth daily., Disp: , Rfl:  .  diphenhydrAMINE (BENADRYL) 25 MG tablet, Take 25 mg by mouth at bedtime as needed., Disp: , Rfl:  .  escitalopram (LEXAPRO) 10 MG tablet, TAKE 1 TABLET(10 MG) BY MOUTH DAILY, Disp: 30 tablet, Rfl: 5 .  furosemide  (LASIX) 20 MG tablet, Take 1 tablet (20 mg total) by mouth 2 (two) times daily., Disp: 60 tablet, Rfl: 11 .  guaiFENesin (ROBITUSSIN) 100 MG/5ML SOLN, Take 10 mLs (200 mg total) by mouth every 4 (four) hours as needed for cough or to loosen phlegm., Disp: 1200 mL, Rfl: 0 .  lidocaine (LIDODERM) 5 %, Place 1 patch onto Mary skin daily. Remove & Discard patch within 12 hours or as directed by MD, Disp: 30 patch, Rfl: 0 .  MULTIPLE VITAMIN PO, Take 1 tablet by mouth daily. , Disp: , Rfl:  .  OXYGEN, Inhale 2.5 L daily into Mary lungs., Disp: , Rfl:  .  pantoprazole (PROTONIX) 40 MG tablet, TAKE 1 TABLET(40 MG) BY MOUTH DAILY, Disp: 90 tablet, Rfl: 1 .  valACYclovir (VALTREX) 1000 MG tablet, Take 1,000 mg by mouth 2 (two) times daily. , Disp: , Rfl:   Past Medical History: Past Medical History:  Diagnosis Date  . COPD (chronic obstructive pulmonary disease) ([place])   . Diverticulitis 2015  . Essential hypertension   . GERD (gastroesophageal reflux disease)   . Hyperlipidemia   . PAH (pulmonary artery hypertension) ([place])    a. 01/2017 Echo: PASP 34mHg; b. 01/2017 RHC: mild PAH.  . Takotsubo cardiomyopathy    a. 01/2017 Echo: EF 25-30%, sev glob HK w/ basal regions moving well. Gr1 DD. mild MR, mildly dil  LA, nl RV fxn, mild to mod TR, PASP 40mHg; b. 01/2017 Cath: Nl cors w/ EF 35-45%. HK of distal segments suggestive of Takotsubo CM.   .Marland KitchenThyroid disease     Tobacco Use: Social History   Tobacco Use  Smoking Status Former Smoker  . Packs/day: 1.50  . Years: 29.00  . Pack years: 43.50  . Types: Cigarettes  . Last attempt to quit: 04/11/1988  . Years since quitting: 29.2  Smokeless Tobacco Never Used  Tobacco Comment   quit smoking in 1990    Labs: Recent Review Flowsheet Data    Labs for ITP Cardiac and Pulmonary Rehab Latest Ref Rng & Units 11/10/2016 12/14/2016 01/11/2017 01/11/2017 05/22/2017   Cholestrol 0 - 200 mg/dL 151 188 - - 207(H)   LDLCALC 0 - 99 mg/dL 46 84 - - 102(H)    LDLDIRECT mg/dL - - - - -   HDL >39.00 mg/dL 93.80 88.80 - - 87.40   Trlycerides 0.0 - 149.0 mg/dL 56.0 79.0 - - 91.0   Hemoglobin A1c <5.7 % of total Hgb 6.1 - - - 5.8(H)   PHART 7.350 - 7.450 - - - 7.21(L) -   PCO2ART 32.0 - 48.0 mmHg - - - 48 -   HCO3 20.0 - 28.0 mmol/L - - 25.3 19.2(L) -   ACIDBASEDEF 0.0 - 2.0 mmol/L - - 10.5(H) 8.8(H) -   O2SAT % - - 84.6 99.0 -       Pulmonary Assessment Scores: Pulmonary Assessment Scores    Row Name 04/18/17 1122 05/29/17 1140 07/10/17 1235     ADL UCSD   ADL Phase  Entry  Mid  -   SOB Score total  _0 Rest  1  0  0   Walk  0  0  0   Stairs  _1 Bath  0  0  0   Dress  0  0  0   Shop  0  0  0     CAT Score   CAT Score  14  -  6     mMRC Score   mMRC Score  1  -  -      Pulmonary Function Assessment: Pulmonary Function Assessment - 04/18/17 1124      Breath   Bilateral Breath Sounds  Clear    Shortness of Breath  No       Exercise Target Goals:    Exercise Program Goal: Individual exercise prescription set using results from initial 6 min walk test and THRR while considering  Howe's activity barriers and safety.    Exercise Prescription Goal: Initial exercise prescription builds to 30-45 minutes a day of aerobic activity, 2-3 days per week.  Home exercise guidelines will be given to Howe during program as part of exercise prescription that Mary participant will acknowledge.  Activity Barriers & Risk Stratification:   6 Minute Walk: 6 Minute Walk    Row Name 04/18/17 1227 07/12/17 1156       6 Minute Walk   Phase  -  Discharge    Distance  1040 feet  1430 feet    Distance % Change  -  37.5 %    Distance Feet Change  -  390 ft    Walk Time  6 minutes  6 minutes    # of Rest Breaks  0  0    MPH  1.97  2.71  METS  1.98  2.78    RPE  10  13    Perceived Dyspnea   1  1    VO2 Peak  6.94  9.74    Symptoms  No  No    Resting HR  78 bpm  73 bpm    Resting BP  134/64  118/62    Resting  Oxygen Saturation   95 %  95 %    Exercise Oxygen Saturation  during 6 min walk  89 %  84 %    Max Ex. HR  96 bpm  109 bpm    Max Ex. BP  130/60  134/62    2 Minute Post BP  120/60  -      Interval HR   1 Minute HR  82  88    2 Minute HR  79  90    3 Minute HR  -  108    4 Minute HR  86  109    5 Minute HR  92  109    6 Minute HR  96  108    2 Minute Post HR  74  -    Interval Heart Rate?  Yes  Yes      Interval Oxygen   Interval Oxygen?  Yes  Yes    Baseline Oxygen Saturation %  95 %  95 %    1 Minute Oxygen Saturation %  94 %  88 %    1 Minute Liters of Oxygen  0 L  0 L Room Air    2 Minute Oxygen Saturation %  89 %  89 %    2 Minute Liters of Oxygen  0 L  0 L    3 Minute Oxygen Saturation %  -  84 %    3 Minute Liters of Oxygen  0 L  0 L    4 Minute Oxygen Saturation %  94 %  91 %    4 Minute Liters of Oxygen  0 L  0 L    5 Minute Oxygen Saturation %  93 %  90 %    5 Minute Liters of Oxygen  0 L  0 L    6 Minute Oxygen Saturation %  91 %  90 %    6 Minute Liters of Oxygen  0 L  0 L    2 Minute Post Oxygen Saturation %  95 %  -    2 Minute Post Liters of Oxygen  0 L  -      Oxygen Initial Assessment: Oxygen Initial Assessment - 04/18/17 1126      Home Oxygen   Home Oxygen Device  Home Concentrator;E-Tanks    Sleep Oxygen Prescription  Continuous    Liters per minute  2.5    Home Exercise Oxygen Prescription  Continuous she uses only when things are strenuous    Liters per minute  2.5    Home at Rest Exercise Oxygen Prescription  Continuous    Liters per minute  2.5    Compliance with Home Oxygen Use  Yes She does not use her oxygen all Mary time at home. Her oxygen saturations have been better. When she uses exertion she uses oxygen      Initial 6 min Walk   Oxygen Used  None      Program Oxygen Prescription   Program Oxygen Prescription  None      Intervention   Short Term Goals  To learn and understand importance of maintaining oxygen saturations>88%;To learn  and demonstrate proper pursed lip breathing techniques or other breathing techniques.;To learn and understand importance of monitoring SPO2 with pulse oximeter and demonstrate accurate use of Mary pulse oximeter.;To learn and exhibit compliance with exercise, home and travel O2 prescription;To learn and demonstrate proper use of respiratory medications    Long  Term Goals  Exhibits compliance with exercise, home and travel O2 prescription;Verbalizes importance of monitoring SPO2 with pulse oximeter and return demonstration;Maintenance of O2 saturations>88%;Exhibits proper breathing techniques, such as pursed lip breathing or other method taught during program session;Compliance with respiratory medication;Demonstrates proper use of MDI's       Oxygen Re-Evaluation: Oxygen Re-Evaluation    Row Name 04/26/17 1128 06/02/17 1151 06/30/17 1213         Program Oxygen Prescription   Program Oxygen Prescription  -  None  None       Home Oxygen   Home Oxygen Device  -  Home Concentrator  Home Concentrator     Sleep Oxygen Prescription  -  Continuous  Continuous     Liters per minute  -  2  2     Home Exercise Oxygen Prescription  -  None  None     Liters per minute  -  -  2.5     Home at Rest Exercise Oxygen Prescription  -  None  None     Liters per minute  -  -  2.5     Compliance with Home Oxygen Use  -  Yes  Yes       Goals/Expected Outcomes   Short Term Goals  To learn and demonstrate proper pursed lip breathing techniques or other breathing techniques.;To learn and understand importance of maintaining oxygen saturations>88%  To learn and demonstrate proper pursed lip breathing techniques or other breathing techniques.;To learn and understand importance of maintaining oxygen saturations>88%;To learn and exhibit compliance with exercise, home and travel O2 prescription;To learn and understand importance of monitoring SPO2 with pulse oximeter and demonstrate accurate use of Mary pulse  oximeter.;To learn and demonstrate proper use of respiratory medications  To learn and demonstrate proper pursed lip breathing techniques or other breathing techniques.;To learn and understand importance of maintaining oxygen saturations>88%;To learn and exhibit compliance with exercise, home and travel O2 prescription;To learn and understand importance of monitoring SPO2 with pulse oximeter and demonstrate accurate use of Mary pulse oximeter.;To learn and demonstrate proper use of respiratory medications     Long  Term Goals  Maintenance of O2 saturations>88%;Exhibits proper breathing techniques, such as pursed lip breathing or other method taught during program session  Exhibits proper breathing techniques, such as pursed lip breathing or other method taught during program session;Exhibits compliance with exercise, home and travel O2 prescription;Verbalizes importance of monitoring SPO2 with pulse oximeter and return demonstration;Maintenance of O2 saturations>88%;Compliance with respiratory medication;Demonstrates proper use of MDI's  Exhibits proper breathing techniques, such as pursed lip breathing or other method taught during program session;Exhibits compliance with exercise, home and travel O2 prescription;Verbalizes importance of monitoring SPO2 with pulse oximeter and return demonstration;Maintenance of O2 saturations>88%;Compliance with respiratory medication;Demonstrates proper use of MDI's     Comments  Reviewed PLB technique with pt.  Talked about how it work and it's important to maintaining his exercise saturations.    Mary Howe has been doing well with her oxygen theapy at home. Mary biggest problem is getting tangled in her cannual at night.  She continues to use her  pulse oximeter to monitor her saturations which have all been above 88%.  She uses her nebulizer twice a day and has not needed her inhaler.  She is doing well with her medications. She has continued to use Mary PLB without queueing.    Mary Howe has been wearing her oxygen at home for th emost part. Some nights she forgets to put it on because she falls asleep in her recliner. She is getting better about checking her pulse ox, which continue to be above 88%. She continues to manage her medication well and hs not needed her inhaler.      Goals/Expected Outcomes  Short: Become more profiecient at using PLB.   Long: Become independent at using PLB.  Short: Continue to be complaint wiht night oxygen and PLB.  Long: Continue to use PLB.   Short: compliance with night oxygen, remembering to place it on before falling asleep. Long: continue to be independent with medication and pulse ox use.         Oxygen Discharge (Final Oxygen Re-Evaluation): Oxygen Re-Evaluation - 06/30/17 1213      Program Oxygen Prescription   Program Oxygen Prescription  None      Home Oxygen   Home Oxygen Device  Home Concentrator    Sleep Oxygen Prescription  Continuous    Liters per minute  2    Home Exercise Oxygen Prescription  None    Liters per minute  2.5    Home at Rest Exercise Oxygen Prescription  None    Liters per minute  2.5    Compliance with Home Oxygen Use  Yes      Goals/Expected Outcomes   Short Term Goals  To learn and demonstrate proper pursed lip breathing techniques or other breathing techniques.;To learn and understand importance of maintaining oxygen saturations>88%;To learn and exhibit compliance with exercise, home and travel O2 prescription;To learn and understand importance of monitoring SPO2 with pulse oximeter and demonstrate accurate use of Mary pulse oximeter.;To learn and demonstrate proper use of respiratory medications    Long  Term Goals  Exhibits proper breathing techniques, such as pursed lip breathing or other method taught during program session;Exhibits compliance with exercise, home and travel O2 prescription;Verbalizes importance of monitoring SPO2 with pulse oximeter and return demonstration;Maintenance of O2  saturations>88%;Compliance with respiratory medication;Demonstrates proper use of MDI's    Comments  Mary Howe has been wearing her oxygen at home for th emost part. Some nights she forgets to put it on because she falls asleep in her recliner. She is getting better about checking her pulse ox, which continue to be above 88%. She continues to manage her medication well and hs not needed her inhaler.     Goals/Expected Outcomes  Short: compliance with night oxygen, remembering to place it on before falling asleep. Long: continue to be independent with medication and pulse ox use.        Initial Exercise Prescription: Initial Exercise Prescription - 04/18/17 1200      Date of Initial Exercise RX and Referring Provider   Date  04/18/17    Referring Provider  Tullo      Treadmill   MPH  1.8    Grade  0    Minutes  15    METs  2      NuStep   Level  2    SPM  80    Minutes  15    METs  2      REL-XR   Level  2    Speed  50    Minutes  15    METs  2      Prescription Details   Frequency (times per week)  3    Duration  Progress to 45 minutes of aerobic exercise without signs/symptoms of physical distress      Intensity   THRR 40-80% of Max Heartrate  102-126    Ratings of Perceived Exertion  11-13    Perceived Dyspnea  0-4      Resistance Training   Training Prescription  Yes    Weight  2 lb    Reps  10-15       Perform Capillary Blood Glucose checks as needed.  Exercise Prescription Changes: Exercise Prescription Changes    Row Name 05/10/17 1500 05/23/17 1500 06/05/17 1600 06/20/17 1400 07/05/17 1500     Response to Exercise   Blood Pressure (Admit)  124/62  120/60  112/75  122/72  114/74   Blood Pressure (Exit)  126/64  96/54  106/62  102/50  116/62   Heart Rate (Admit)  74 bpm  75 bpm  78 bpm  69 bpm  72 bpm   Heart Rate (Exercise)  95 bpm  105 bpm  99 bpm  101 bpm  98 bpm   Heart Rate (Exit)  79 bpm  72 bpm  99 bpm  74 bpm  67 bpm   Oxygen Saturation (Admit)   96 %  94 %  96 %  96 %  91 %   Oxygen Saturation (Exercise)  90 %  89 %  90 %  89 %  93 %   Oxygen Saturation (Exit)  93 %  96 %  97 %  98 %  94 %   Rating of Perceived Exertion (Exercise)  _0 Perceived Dyspnea (Exercise)  _1 Symptoms  none  none  none  none  none   Duration  Continue with 45 min of aerobic exercise without signs/symptoms of physical distress.  Continue with 45 min of aerobic exercise without signs/symptoms of physical distress.  Continue with 45 min of aerobic exercise without signs/symptoms of physical distress.  Continue with 45 min of aerobic exercise without signs/symptoms of physical distress.  Continue with 45 min of aerobic exercise without signs/symptoms of physical distress.   Intensity  THRR unchanged  THRR unchanged  THRR unchanged  THRR unchanged  THRR unchanged     Progression   Progression  Continue to progress workloads to maintain intensity without signs/symptoms of physical distress.  Continue to progress workloads to maintain intensity without signs/symptoms of physical distress.  Continue to progress workloads to maintain intensity without signs/symptoms of physical distress.  Continue to progress workloads to maintain intensity without signs/symptoms of physical distress.  Continue to progress workloads to maintain intensity without signs/symptoms of physical distress.   Average METs  3.95  3.7  3.55  4.7  4.63     Resistance Training   Training Prescription  Yes  Yes  Yes  Yes  Yes   Weight  3 lbs  3 lbs  3 lbs  5 lbs  4 lbs   Reps  10-15  10-15  10-15  10-15  10-15     Interval Training   Interval Training  No  No  No  No  No     Treadmill   MPH  2.3  2.4  2.6  2.7  2.7   Grade  0  0._0 2.5   Minutes  _1 METs  2.76  3  3.35  3.81  4     NuStep   Level  _2 SPM  87  92  79  87  91   Minutes  _3 METs  3.5  3.2  3.1  3.6  3.6     REL-XR   Level  _4 Speed  65  60  52  -  -   Minutes  _5 METs  5.6  4.9  4.2  6.7  6.3     Home Exercise Plan   Plans to continue exercise at  Home (comment) walking  Home (comment) walking  Home (comment) walking  Home (comment) walking  Home (comment) walking   Frequency  Add 1 additional day to program exercise sessions.  Add 1 additional day to program exercise sessions.  Add 2 additional days to program exercise sessions.  Add 2 additional days to program exercise sessions.  Add 2 additional days to program exercise sessions.   Initial Home Exercises Provided  05/10/17  05/10/17  05/10/17  05/10/17  05/10/17      Exercise Comments: Exercise Comments    Row Name 04/26/17 1128           Exercise Comments  First full day of exercise!  Howe was oriented to gym and equipment including functions, settings, policies, and procedures.  Howe's individual exercise prescription and treatment plan were reviewed.  All starting workloads were established based on Mary results of Mary 6 minute walk test done at initial orientation visit.  Mary plan for exercise progression was also introduced and progression will be customized based on Howe's performance and goals.          Exercise Goals and Review: Exercise Goals    Row Name 04/18/17 1227             Exercise Goals   Increase Physical Activity  Yes       Intervention  Provide advice, education, support and counseling about physical activity/exercise needs.;Develop an individualized exercise prescription for aerobic and resistive training based on initial evaluation findings, risk stratification, comorbidities and participant's personal goals.       Expected Outcomes  Achievement of increased cardiorespiratory fitness and enhanced flexibility, muscular endurance and strength shown through measurements of functional capacity and personal statement of participant.       Increase Strength and Stamina  Yes       Intervention  Provide  advice, education, support and counseling about physical activity/exercise needs.;Develop an individualized exercise prescription for aerobic and resistive training based on initial evaluation findings, risk stratification, comorbidities and participant's personal goals.       Expected Outcomes  Achievement of increased cardiorespiratory fitness and enhanced flexibility, muscular endurance and strength shown through measurements of functional capacity and personal statement of participant.       Able to understand and use rate of perceived exertion (RPE) scale  Yes       Intervention  Provide education and explanation on how to use RPE scale       Expected  Outcomes  Short Term: Able to use RPE daily in rehab to express subjective intensity level;Long Term:  Able to use RPE to guide intensity level when exercising independently       Able to understand and use Dyspnea scale  Yes       Intervention  Provide education and explanation on how to use Dyspnea scale       Expected Outcomes  Short Term: Able to use Dyspnea scale daily in rehab to express subjective sense of shortness of breath during exertion;Long Term: Able to use Dyspnea scale to guide intensity level when exercising independently       Knowledge and understanding of Target Heart Rate Range (THRR)  Yes       Intervention  Provide education and explanation of THRR including how Mary numbers were predicted and where they are located for reference       Expected Outcomes  Short Term: Able to state/look up THRR;Long Term: Able to use THRR to govern intensity when exercising independently;Short Term: Able to use daily as guideline for intensity in rehab       Able to check pulse independently  Yes       Intervention  Provide education and demonstration on how to check pulse in carotid and radial arteries.;Review Mary importance of being able to check your own pulse for safety during independent exercise       Expected Outcomes  Short Term: Able to  explain why pulse checking is important during independent exercise;Long Term: Able to check pulse independently and accurately       Understanding of Exercise Prescription  Yes       Intervention  Provide education, explanation, and written materials on Howe's individual exercise prescription       Expected Outcomes  Short Term: Able to explain program exercise prescription;Long Term: Able to explain home exercise prescription to exercise independently          Exercise Goals Re-Evaluation : Exercise Goals Re-Evaluation    Row Name 04/26/17 1127 05/10/17 1232 05/23/17 1526 06/02/17 1147 06/05/17 1631     Exercise Goal Re-Evaluation   Exercise Goals Review  Able to understand and use rate of perceived exertion (RPE) scale;Knowledge and understanding of Target Heart Rate Range (THRR);Able to understand and use Dyspnea scale;Understanding of Exercise Prescription  Increase Physical Activity;Able to understand and use Dyspnea scale;Understanding of Exercise Prescription;Increase Strength and Stamina;Knowledge and understanding of Target Heart Rate Range (THRR);Able to understand and use rate of perceived exertion (RPE) scale;Able to check pulse independently  Increase Physical Activity;Understanding of Exercise Prescription;Increase Strength and Stamina  Increase Physical Activity;Understanding of Exercise Prescription;Increase Strength and Stamina  Increase Physical Activity;Understanding of Exercise Prescription;Increase Strength and Stamina   Comments  Reviewed RPE scale, THR and program prescription with pt today.  Pt voiced understanding and was given a copy of goals to take home.   Mary Howe is off to a good start in rehab. She is starting to notice an increase in her strength and stamina.  We talked about adding in home exercise to help.  Reviewed home exercise with pt today.  Pt plans to walk at home for exercise.  Reviewed THR, pulse, RPE, sign and symptoms, and when to call 911 or MD.  Also  discussed weather considerations and indoor options.  Pt voiced understanding.  Mary Howe continues to do well in rehab.  Se  is now up to 2.4 mph on Mary treadmill.  We will attempt to increase her workload on Mary  XR as she was able to do level 4 one day.  We will continue to monitor her progression.   Mary Howe is doing well in rehab.  She is feeling stronger and has more stamina.  She has been going up and down Mary step more often.  She does Mary leg and arm exercises daily and walks 2-3 extra days a week. Overall she is feeling better.   Mary Howe continues to do well in rehab.  She is now up to level 4 on Mary NuStep.  We will be moving up her workload on Mary treadmill at her next visit.  We will continue to monitor her progression.    Expected Outcomes  Short: Use RPE daily to regulate intensity.  Long: Follow program prescription in THR.  Short: Add in at least one extra day of walking at home.  Long: Continue to increase physical activity  Short: Continue to try to walk some at home.   Long: Continue to work on increase activity levels.   Short: Continue to focus on getting her walk in at home at least two days every week.   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   Short: Increase workload on treadmill and XR.  Long: Continue to walk at home on off days.    [place] Name 06/20/17 1447 07/05/17 1458           Exercise Goal Re-Evaluation   Exercise Goals Review  Increase Physical Activity;Understanding of Exercise Prescription;Increase Strength and Stamina  Increase Physical Activity;Understanding of Exercise Prescription;Increase Strength and Stamina      Comments  Mary Howe has been doing well in rehab.  She is now up to 3.5 METs on Mary NuStep and 6.7 on Mary XR.  Tomorrow she will do 2.5% grade on Mary treadmill for her full 15 min versus just Mary last five minutes.  We did a challenge day to increase weigthts and she has stuck with her heavier weigths!!  We will continue to monitor her  progression.   Mary Howe is nearing graduation.  She is interested in continuing to exercise either in [employer] or in Mary [place].  She is going to talk with her daughter about her options and which may suit her Mary best. She has continued to use Mary 4 lbs weights!!  We will continue to monitor her progress.       Expected Outcomes  Short: Increase level on XR.  Long: Continue to exercise independently  Short: Improve post 6MWT!  Long: Continue to increase strength and stamina.          Discharge Exercise Prescription (Final Exercise Prescription Changes): Exercise Prescription Changes - 07/05/17 1500      Response to Exercise   Blood Pressure (Admit)  114/74    Blood Pressure (Exit)  116/62    Heart Rate (Admit)  72 bpm    Heart Rate (Exercise)  98 bpm    Heart Rate (Exit)  67 bpm    Oxygen Saturation (Admit)  91 %    Oxygen Saturation (Exercise)  93 %    Oxygen Saturation (Exit)  94 %    Rating of Perceived Exertion (Exercise)  13    Perceived Dyspnea (Exercise)  1    Symptoms  none    Duration  Continue with 45 min of aerobic exercise without signs/symptoms of physical distress.    Intensity  THRR unchanged      Progression   Progression  Continue to progress workloads to maintain intensity  without signs/symptoms of physical distress.    Average METs  4.63      Resistance Training   Training Prescription  Yes    Weight  4 lbs    Reps  10-15      Interval Training   Interval Training  No      Treadmill   MPH  2.7    Grade  2.5    Minutes  15    METs  4      NuStep   Level  4    SPM  91    Minutes  15    METs  3.6      REL-XR   Level  3    Minutes  15    METs  6.3      Home Exercise Plan   Plans to continue exercise at  Home (comment) walking    Frequency  Add 2 additional days to program exercise sessions.    Initial Home Exercises Provided  05/10/17       Nutrition:  Target Goals: Understanding of nutrition guidelines, daily intake of sodium <1550m,  cholesterol <2036m calories 30% from fat and 7% or less from saturated fats, daily to have 5 or more servings of fruits and vegetables.  Biometrics: Pre Biometrics - 04/18/17 1226      Pre Biometrics   Height  _0  (1.626 m)    Weight  127 lb 9.6 oz (57.9 kg)    Waist Circumference  29.75 inches    Hip Circumference  39 inches    Waist to Hip Ratio  0.76 %    BMI (Calculated)  21.89        Nutrition Therapy Plan and Nutrition Goals: Nutrition Therapy & Goals - 04/18/17 1121      Personal Nutrition Goals   Comments  She plans to meet with Mary dietician. She states she eats pretty well and watches what she eats.       Intervention Plan   Intervention  Nutrition handout(s) given to Howe.;Prescribe, educate and counsel regarding individualized specific dietary modifications aiming towards targeted core components such as weight, hypertension, lipid management, diabetes, heart failure and other comorbidities.    Expected Outcomes  Short Term Goal: Understand basic principles of dietary content, such as calories, fat, sodium, cholesterol and nutrients.;Long Term Goal: Adherence to prescribed nutrition plan.       Nutrition Assessments: Nutrition Assessments - 07/10/17 1237      MEDFICTS Scores   Pre Score  44       Nutrition Goals Re-Evaluation: Nutrition Goals Re-Evaluation    [place] Name 05/17/17 1213 06/02/17 1159 06/30/17 1209         Goals   Nutrition Goal  Meet with dietician to make sure she is eating enough and eating well.   Meet with dietician to make sure she is eating enough and eating well.   Mary Howe's dietician appointment is rescheduled for 3/27. She was encouraged to write down questions/topics to discuss with LiLattie Haws she thinks of them so she doesn't forget.     Comment  Mary Howe not yet met with dietician.  She was waiting to see if her daughter wanted to go Mary appointment with her, but she forgets to ask.  Appointment scheduled for 2/13 during class.    Appointment moved to 2/27 during class.   -     Expected Outcome  Short: Meet with dietician.  Long: Follow recommendations outlined by dietician.  Short: Meet with  dietician.  Long: Follow recommendations outlined by dietician.  Short: meet with dietician. Long: follow recommendations set with dietician and work on meeting goals.         Nutrition Goals Discharge (Final Nutrition Goals Re-Evaluation): Nutrition Goals Re-Evaluation - 06/30/17 1209      Goals   Nutrition Goal  Jamise's dietician appointment is rescheduled for 3/27. She was encouraged to write down questions/topics to discuss with Lattie Haw as she thinks of them so she doesn't forget.    Expected Outcome  Short: meet with dietician. Long: follow recommendations set with dietician and work on meeting goals.        Psychosocial: Target Goals: Acknowledge presence or absence of significant depression and/or stress, maximize coping skills, provide positive support system. Participant is able to verbalize types and ability to use techniques and skills needed for reducing stress and depression.   Initial Review & Psychosocial Screening: Initial Psych Review & Screening - 04/18/17 1116      Initial Review   Current issues with  Current Stress Concerns    Source of Stress Concerns  Unable to perform yard/household activities    Comments  Under some stress. Her sister that she lives with is 29 and sometimes she gets on her nerves. She does not get short of breath that often but sometimes she gets short of breath when she is grocery shopping. She moved from [place] in 2014.       Family Dynamics   Good Support System?  Yes    Comments  Her daughter helps her when she needs it.       Barriers   Psychosocial barriers to participate in program  Mary Howe should benefit from training in stress management and relaxation.      Screening Interventions   Interventions  Yes;Encouraged to exercise;Program counselor consult;Provide feedback  about Mary scores to participant;To provide support and resources with identified psychosocial needs    Expected Outcomes  Short Term goal: Utilizing psychosocial counselor, staff and physician to assist with identification of specific Stressors or current issues interfering with healing process. Setting desired goal for each stressor or current issue identified.;Long Term Goal: Stressors or current issues are controlled or eliminated.;Short Term goal: Identification and review with participant of any Quality of Life or Depression concerns found by scoring Mary questionnaire.;Long Term goal: Mary participant improves quality of Life and PHQ9 Scores as seen by post scores and/or verbalization of changes       Quality of Life Scores:  Scores of 19 and below usually indicate a poorer quality of life in these areas.  A difference of  2-3 points is a clinically meaningful difference.  A difference of 2-3 points in Mary total score of Mary Quality of Life Index has been associated with significant improvement in overall quality of life, self-image, physical symptoms, and general health in studies assessing change in quality of life.  PHQ-9: Recent Review Flowsheet Data    Depression screen [facility] 2/9 07/10/2017 05/29/2017 04/18/2017 09/20/2016 06/22/2016   Decreased Interest 0 0 0 0 0   Down, Depressed, Hopeless 0 0 0 0 0   PHQ - 2 Score 0 0 0 0 0   Altered sleeping 0 0 0 - -   Tired, decreased energy 0 0 0 - -   Change in appetite 0 0 0 - -   Feeling bad or failure about yourself  0 0 0 - -   Trouble concentrating 0 0 0 - -   Moving  slowly or fidgety/restless 0 0 0 - -   Suicidal thoughts 0 0 0 - -   PHQ-9 Score 0 0 0 - -   Difficult doing work/chores Not difficult at all Not difficult at all Not difficult at all - -     Interpretation of Total Score  Total Score Depression Severity:  1-4 = Minimal depression, 5-9 = Mild depression, 10-14 = Moderate depression, 15-19 = Moderately severe depression, 20-27 =  Severe depression   Psychosocial Evaluation and Intervention: Psychosocial Evaluation - 05/17/17 1223      Psychosocial Evaluation & Interventions   Interventions  Encouraged to exercise with Mary program and follow exercise prescription;Stress management education;Relaxation education    Comments  Counselor met with Ms. Kinlaw [facility]) today for initial psychosocial evaluation.  She is an 84 year old who has emphysema. She has a strong support system with a daughter close by; lives with her older sister; and is actively involved in her local church.  Anallely states she sleeps well but has a sleep study scheduled soon since she uses 0xygen at night.  She has a fair appetite and denies a history of depression or anxiety or any current symptoms.  Larhonda states she is typically in a positive mood and has minimal stress in her life - other than her health and challenges living with her old sister at times.  She has goals to breathe better and improve her overall quality of life while in this program.  Staff will follow with her.     Expected Outcomes  Penelopi will benefit from consistent exercise to achieve her stated goals.  Mary educational and psychoeducational parts of this program will be helpful in understanding her condition and coping more positively in general.      Continue Psychosocial Services   Follow up required by staff       Psychosocial Re-Evaluation: Psychosocial Re-Evaluation    [place] Name 06/02/17 1156 06/14/17 1159 06/30/17 1210         Psychosocial Re-Evaluation   Current issues with  Current Stress Concerns  Current Stress Concerns  Current Stress Concerns     Comments  Shela continues to care for her sister and that has been her biggest stressor.  She continues to learn when to just walk away.  She has found coming to class to be helpful to get out and about.  Alandra is also able to do more now with less shortness of breath.  She is remaining postive and coping Mary best she  can with her sister.  Counselor follow with Everlene Farrier today after attending Mary Stress and anxiety education.  She reports having learned new ways of coping with her older sister and actually has become more compassionate - realizing her 66 year old sister is not going to change.  Genevie is learning to let things go and is enjoying working out for stress.  She is experiencing progress with more energy and breathing better.  Counselor commended [place] on her progress made in positive self-care.  Staff will continue to follow with her  Dacia is starting to handle her older sister better. She is practicing Mary stress reduction techniques taught in class with success. She was encouraged to remember to take care and special time for herself. She is planning a summer break, where another family member will care for her sister for a couple days. She also has some day trips planned with her sister.      Expected Outcomes  Short: Continue  to cope with her sister postively.   Long: Continue to remain postive.   Cearra will continue to practice stress management in coping with her older sister.  She will continue to exercise for physiological benefits as well as mental and emotional benefits.    Short: Rosea will continue to practice stress/relaxation techniques. Long: she will take time for self-care and continue to exericse to help with emotional management.      Interventions  Stress management education;Encouraged to attend Pulmonary Rehabilitation for Mary exercise  -  -     Continue Psychosocial Services   Follow up required by staff  -  -     Comments  Under some stress. Her sister that she lives with is 92 and sometimes she gets on her nerves. She does not get short of breath that often but sometimes she gets short of breath when she is grocery shopping. She moved from [place] in 2014.   -  -       Initial Review   Source of Stress Concerns  Family  -  -        Psychosocial Discharge (Final Psychosocial  Re-Evaluation): Psychosocial Re-Evaluation - 06/30/17 1210      Psychosocial Re-Evaluation   Current issues with  Current Stress Concerns    Comments  Sherron is starting to handle her older sister better. She is practicing Mary stress reduction techniques taught in class with success. She was encouraged to remember to take care and special time for herself. She is planning a summer break, where another family member will care for her sister for a couple days. She also has some day trips planned with her sister.     Expected Outcomes  Short: Creedence will continue to practice stress/relaxation techniques. Long: she will take time for self-care and continue to exericse to help with emotional management.        Education: Education Goals: Education classes will be provided on a weekly basis, covering required topics. Participant will state understanding/return demonstration of topics presented.  Learning Barriers/Preferences: Learning Barriers/Preferences - 04/18/17 1126      Learning Barriers/Preferences   Learning Barriers  Sight wears glasses    Learning Preferences  None       Education Topics:  Initial Evaluation Education: - Verbal, written and demonstration of respiratory meds, oximetry and breathing techniques. Instruction on use of nebulizers and MDIs and importance of monitoring MDI activations.   Pulmonary Rehab from 07/10/2017 in [facility] Cardiac and Pulmonary Rehab  Date  04/18/17  Educator  [facility]  Instruction Review Code  1- Verbalizes Understanding      General Nutrition Guidelines/Fats and Fiber: -Group instruction provided by verbal, written material, models and posters to present Mary general guidelines for heart healthy nutrition. Gives an explanation and review of dietary fats and fiber.   Pulmonary Rehab from 07/10/2017 in [facility] Cardiac and Pulmonary Rehab  Date  07/03/17  Educator  [facility]  Instruction Review Code  1- Verbalizes Understanding      Controlling Sodium/Reading  Food Labels: -Group verbal and written material supporting Mary discussion of sodium use in heart healthy nutrition. Review and explanation with models, verbal and written materials for utilization of Mary food label.   Pulmonary Rehab from 07/10/2017 in [facility] Cardiac and Pulmonary Rehab  Date  07/10/17  Educator  CR  Instruction Review Code  1- Verbalizes Understanding      Exercise Physiology & General Exercise Guidelines: - Group verbal and written instruction with models to review  Mary exercise physiology of Mary cardiovascular system and associated critical values. Provides general exercise guidelines with specific guidelines to those with heart or lung disease.    Aerobic Exercise & Resistance Training: - Gives group verbal and written instruction on Mary various components of exercise. Focuses on aerobic and resistive training programs and Mary benefits of this training and how to safely progress through these programs.   Pulmonary Rehab from 07/10/2017 in [facility] Cardiac and Pulmonary Rehab  Date  04/28/17  Educator  [facility]  Instruction Review Code  1- Verbalizes Understanding      Flexibility, Balance, Mind/Body Relaxation: Provides group verbal/written instruction on Mary benefits of flexibility and balance training, including mind/body exercise modes such as yoga, pilates and tai chi.  Demonstration and skill practice provided.   Pulmonary Rehab from 07/10/2017 in [facility] Cardiac and Pulmonary Rehab  Date  05/24/17  Educator  [facility]  Instruction Review Code  1- Verbalizes Understanding      Stress and Anxiety: - Provides group verbal and written instruction about Mary health risks of elevated stress and causes of high stress.  Discuss Mary correlation between heart/lung disease and anxiety and treatment options. Review healthy ways to manage with stress and anxiety.   Pulmonary Rehab from 07/10/2017 in [facility] Cardiac and Pulmonary Rehab  Date  06/14/17  Educator  [facility]  Instruction Review Code  1-  Verbalizes Understanding      Depression: - Provides group verbal and written instruction on Mary correlation between heart/lung disease and depressed mood, treatment options, and Mary stigmas associated with seeking treatment.   Pulmonary Rehab from 07/10/2017 in [facility] Cardiac and Pulmonary Rehab  Date  05/31/17  Educator  [facility]  Instruction Review Code  1- Verbalizes Understanding      Exercise & Equipment Safety: - Individual verbal instruction and demonstration of equipment use and safety with use of Mary equipment.   Pulmonary Rehab from 07/10/2017 in [facility] Cardiac and Pulmonary Rehab  Date  04/18/17  Educator  [facility]  Instruction Review Code  1- Verbalizes Understanding      Infection Prevention: - Provides verbal and written material to individual with discussion of infection control including proper hand washing and proper equipment cleaning during exercise session.   Pulmonary Rehab from 07/10/2017 in [facility] Cardiac and Pulmonary Rehab  Date  04/18/17  Educator  [facility]  Instruction Review Code  1- Verbalizes Understanding      Falls Prevention: - Provides verbal and written material to individual with discussion of falls prevention and safety.   Pulmonary Rehab from 07/10/2017 in [facility] Cardiac and Pulmonary Rehab  Date  04/18/17  Educator  [facility]  Instruction Review Code  1- Verbalizes Understanding      Diabetes: - Individual verbal and written instruction to review signs/symptoms of diabetes, desired ranges of glucose level fasting, after meals and with exercise. Advice that pre and post exercise glucose checks will be done for 3 sessions at entry of program.   Chronic Lung Diseases: - Group verbal and written instruction to review updates, respiratory medications, advancements in procedures and treatments. Discuss use of supplemental oxygen including available portable oxygen systems, continuous and intermittent flow rates, concentrators, personal use and safety guidelines. Review proper  use of inhaler and spacers. Provide informative websites for self-education.    Pulmonary Rehab from 07/10/2017 in [facility] Cardiac and Pulmonary Rehab  Date  07/05/17  Educator  [facility]  Instruction Review Code  1- Verbalizes Understanding      Energy Conservation: - Provide  group verbal and written instruction for methods to conserve energy, plan and organize activities. Instruct on pacing techniques, use of adaptive equipment and posture/positioning to relieve shortness of breath.   Pulmonary Rehab from 07/10/2017 in [facility] Cardiac and Pulmonary Rehab  Date  06/07/17  Educator  [facility]  Instruction Review Code  1- Verbalizes Understanding      Triggers and Exacerbations: - Group verbal and written instruction to review types of environmental triggers and ways to prevent exacerbations. Discuss weather changes, air quality and Mary benefits of nasal washing. Review warning signs and symptoms to help prevent infections. Discuss techniques for effective airway clearance, coughing, and vibrations.   Pulmonary Rehab from 07/10/2017 in [facility] Cardiac and Pulmonary Rehab  Date  04/26/17  Educator  [facility]  Instruction Review Code  1- Verbalizes Understanding      AED/CPR: - Group verbal and written instruction with Mary use of models to demonstrate Mary basic use of Mary AED with Mary basic ABC's of resuscitation.   Pulmonary Rehab from 07/10/2017 in [facility] Cardiac and Pulmonary Rehab  Date  06/09/17  Educator  [facility]  Instruction Review Code  1- Actuary and Physiology of Mary Lungs: - Group verbal and written instruction with Mary use of models to provide basic lung anatomy and physiology related to function, structure and complications of lung disease.   Pulmonary Rehab from 07/10/2017 in [facility] Cardiac and Pulmonary Rehab  Date  06/21/17  Educator  [facility]  Instruction Review Code  1- Verbalizes Understanding      Anatomy & Physiology of Mary Heart: - Group verbal and written instruction and  models provide basic cardiac anatomy and physiology, with Mary coronary electrical and arterial systems. Review of Valvular disease and Heart Failure   Pulmonary Rehab from 07/10/2017 in [facility] Cardiac and Pulmonary Rehab  Date  05/10/17  Educator  [facility]  Instruction Review Code  1- Verbalizes Understanding      Cardiac Medications: - Group verbal and written instruction to review commonly prescribed medications for heart disease. Reviews Mary medication, class of Mary drug, and side effects.   Know Your Numbers and Risk Factors: -Group verbal and written instruction about important numbers in your health.  Discussion of what are risk factors and how they play a role in Mary disease process.  Review of Cholesterol, Blood Pressure, Diabetes, and BMI and Mary role they play in your overall health.   Sleep Hygiene: -Provides group verbal and written instruction about how sleep can affect your health.  Define sleep hygiene, discuss sleep cycles and impact of sleep habits. Review good sleep hygiene tips.    Other: -Provides group and verbal instruction on various topics (see comments)   Pulmonary Rehab from 07/10/2017 in [facility] Cardiac and Pulmonary Rehab  Date  05/03/17  Educator  [facility]  Instruction Review Code  1- Verbalizes Understanding [SLEEP]       Knowledge Questionnaire Score: Knowledge Questionnaire Score - 07/10/17 1236      Knowledge Questionnaire Score   Pre Score  13/18    Post Score  15/18 reviewed with Howe        Core Components/Risk Factors/Howe Goals at Admission: Personal Goals and Risk Factors at Admission - 04/18/17 1136      Core Components/Risk Factors/Howe Goals on Admission    Weight Management  Yes;Weight Maintenance    Intervention  Weight Management: Develop a combined nutrition and exercise program designed to reach desired caloric intake, while maintaining appropriate intake of  nutrient and fiber, sodium and fats, and appropriate energy expenditure required  for Mary weight goal.;Weight Management: Provide education and appropriate resources to help participant work on and attain dietary goals.;Weight Management/Obesity: Establish reasonable short term and long term weight goals.    Admit Weight  127 lb 9.6 oz (57.9 kg)    Goal Weight: Short Term  127 lb (57.6 kg)    Goal Weight: Long Term  127 lb (57.6 kg)    Expected Outcomes  Short Term: Continue to assess and modify interventions until short term weight is achieved;Long Term: Adherence to nutrition and physical activity/exercise program aimed toward attainment of established weight goal;Weight Maintenance: Understanding of Mary daily nutrition guidelines, which includes 25-35% calories from fat, 7% or less cal from saturated fats, less than 261m cholesterol, less than 1.5gm of sodium, & 5 or more servings of fruits and vegetables daily;Understanding recommendations for meals to include 15-35% energy as protein, 25-35% energy from fat, 35-60% energy from carbohydrates, less than 2041mof dietary cholesterol, 20-35 gm of total fiber daily;Understanding of distribution of calorie intake throughout Mary day with Mary consumption of 4-5 meals/snacks    Improve shortness of breath with ADL's  Yes    Intervention  Provide education, individualized exercise plan and daily activity instruction to help decrease symptoms of SOB with activities of daily living.    Expected Outcomes  Short Term: Achieves a reduction of symptoms when performing activities of daily living.    Heart Failure  Yes    Intervention  Provide a combined exercise and nutrition program that is supplemented with education, support and counseling about heart failure. Directed toward relieving symptoms such as shortness of breath, decreased exercise tolerance, and extremity edema.    Expected Outcomes  Improve functional capacity of life;Short term: Attendance in program 2-3 days a week with increased exercise capacity. Reported lower sodium intake.  Reported increased fruit and vegetable intake. Reports medication compliance.;Short term: Daily weights obtained and reported for increase. Utilizing diuretic protocols set by physician.;Long term: Adoption of self-care skills and reduction of barriers for early signs and symptoms recognition and intervention leading to self-care maintenance.    Hypertension  Yes takes medication     Intervention  Provide education on lifestyle modifcations including regular physical activity/exercise, weight management, moderate sodium restriction and increased consumption of fresh fruit, vegetables, and low fat dairy, alcohol moderation, and smoking cessation.;Monitor prescription use compliance.    Expected Outcomes  Short Term: Continued assessment and intervention until BP is < 140/9061mG in hypertensive participants. < 130/24m26m in hypertensive participants with diabetes, heart failure or chronic kidney disease.;Long Term: Maintenance of blood pressure at goal levels.       Core Components/Risk Factors/Howe Goals Review:  Goals and Risk Factor Review    Row Name 05/17/17 1222 06/02/17 1149 06/30/17 1205         Core Components/Risk Factors/Howe Goals Review   Personal Goals Review  Weight Management/Obesity;Improve shortness of breath with ADL's;Heart Failure;Hypertension;Stress  Weight Management/Obesity;Improve shortness of breath with ADL's;Heart Failure;Hypertension  Weight Management/Obesity;Heart Failure;Improve shortness of breath with ADL's     Review  MildNasimtes that her doctor wants her blood pressure to be below 140 326 systolic. Her weight has been good and she is interested in maintaining it. MildCathyrna caregiver to her older sister and helps her to her appointments. She does not notice herself getting short of breath at home as much.  MildVicki been doing well in rehab.  SOB at  home is getting better and she is able to do more at home.   Her blood pressures have been  good and she  is checking them at home.   Usually gets them in Mary 120-130s.  Her weight has been stable as well.  She has not had any heart failure sysmptoms.   Phillip Heal has been able to tell a difference in her SOB since starting [facility]. She feels like she has Mary energy and Mary breathing ability to do more around Mary house and outside Mary home. Her pressures have been running consistent (120s-130s) and she continues to check them at home. Her weight was up a couple pounds this week, but her doctor told her to take her afternoon dose when she needs it, like this week when her weight is up. She is managing her HF and is aware of her symptoms, as she weighs herself daiy and checks for swelling.      Expected Outcomes  Short: attend [facility] to improve ADL's.  Long: maintain exercise to reduce stress and Improve ADL.  Short: Continue to work on improving SOB for ADLs.  Long: Continue to monitor blood pressures regularly.   Short: continue to work on SOB and her weight will go back to baseline, around 125 lb. Long: indpendently manage blood pressure and heart failure symptoms, continue to communicate effectively with doctor regarding symptoms.         Core Components/Risk Factors/Howe Goals at Discharge (Final Review):  Goals and Risk Factor Review - 06/30/17 1205      Core Components/Risk Factors/Howe Goals Review   Personal Goals Review  Weight Management/Obesity;Heart Failure;Improve shortness of breath with ADL's    Review  Phillip Heal has been able to tell a difference in her SOB since starting [facility]. She feels like she has Mary energy and Mary breathing ability to do more around Mary house and outside Mary home. Her pressures have been running consistent (120s-130s) and she continues to check them at home. Her weight was up a couple pounds this week, but her doctor told her to take her afternoon dose when she needs it, like this week when her weight is up. She is managing her HF and is aware of her symptoms, as  she weighs herself daiy and checks for swelling.     Expected Outcomes  Short: continue to work on SOB and her weight will go back to baseline, around 125 lb. Long: indpendently manage blood pressure and heart failure symptoms, continue to communicate effectively with doctor regarding symptoms.        ITP Comments: ITP Comments    Row Name 04/18/17 1058 04/24/17 0835 05/22/17 0934 06/19/17 0836 07/17/17 0859   ITP Comments  Medical Evaluation completed. Chart sent for review and changes to Dr. Emily Filbert Director of [facility]. Diagnosis can be found in [facility] encounter 04/18/17  30 day review completed. ITP sent to Dr. Emily Filbert Director of [facility]. Continue with ITP unless changes are made by physician.  30 day review completed. ITP sent to Dr. Emily Filbert Director of [facility]. Continue with ITP unless changes are made by physician.  30 day review completed. ITP sent to Dr. Emily Filbert Director of [facility]. Continue with ITP unless changes are made by physician.   30 day review completed. ITP sent to Dr. Emily Filbert Director of [facility]. Continue with ITP unless changes are made by physician      Comments: 30 day review

## 2017-07-17 NOTE — Progress Notes (Signed)
Daily Session Note  Patient Details  Name: Mary Howe MRN: [id number] Date of Birth: May 10, 1932 Referring Provider:     Pulmonary Rehab from 04/18/2017 in [facility]  Referring Provider  [place]      Encounter Date: 07/17/2017  Check In: Session Check In - 07/17/17 1122      Check-In   Location  [facility]    Staff Present  Earlean Shawl, BS, ACSM CEP, Exercise Physiologist;Amanda Oletta Darter, BA, ACSM CEP, Exercise Physiologist;Voncile Schwarz Flavia Shipper    Supervising physician immediately available to respond to emergencies  [facility] immediately available ER MD    Physician(s)   Dr. Corky Downs and [facility]    Medication changes reported      No    Fall or balance concerns reported     No    Tobacco Cessation  No Change    Warm-up and Cool-down  Performed as group-led instruction    Resistance Training Performed  Yes    VAD Patient?  No      Pain Assessment   Currently in Pain?  No/denies          Social History   Tobacco Use  Smoking Status Former Smoker  . Packs/day: 1.50  . Years: 29.00  . Pack years: 43.50  . Types: Cigarettes  . Last attempt to quit: 04/11/1988  . Years since quitting: 29.2  Smokeless Tobacco Never Used  Tobacco Comment   quit smoking in 1990    Goals Met:  Proper associated with RPD/PD & O2 Sat Independence with exercise equipment Using PLB without cueing & demonstrates good technique Exercise tolerated well No report of cardiac concerns or symptoms Strength training completed today  Goals Unmet:  Not Applicable  Comments:  Mary Howe graduated today from  rehab with 36 sessions completed.  Details of the patient's exercise prescription and what She needs to do in order to continue the prescription and progress were discussed with patient.  Patient was given a copy of prescription and goals.  Patient verbalized understanding.  Mary Howe plans to continue to exercise by joining [employer].   Dr. Emily Filbert is Medical Director for [facility] and [facility].

## 2017-07-17 NOTE — Progress Notes (Signed)
Pulmonary Individual Treatment Plan  Patient Details  Name: Mary Howe MRN: [id number] Date of Birth: [birth year] Referring Provider:     Pulmonary Rehab from 04/18/2017 in [facility] Cardiac and Pulmonary Rehab  Referring Provider  Derrel Nip      Initial Encounter Date:    Pulmonary Rehab from 04/18/2017 in [facility] Cardiac and Pulmonary Rehab  Date  04/18/17  Referring Provider  Derrel Nip      Visit Diagnosis: Centrilobular emphysema ([place])  Patient's Home Medications on Admission:  Current Outpatient Medications:  .  acetaminophen (TYLENOL) 500 MG tablet, Take 500 mg by mouth every 6 (six) hours as needed., Disp: , Rfl:  .  acyclovir (ZOVIRAX) 400 MG tablet, Take 1 tablet (400 mg total) by mouth 5 (five) times daily., Disp: 35 tablet, Rfl: 0 .  albuterol (PROVENTIL HFA;VENTOLIN HFA) 108 (90 Base) MCG/ACT inhaler, Inhale 1-2 puffs into the lungs every 4 (four) hours as needed for wheezing or shortness of breath., Disp: 1 Inhaler, Rfl: 10 .  alendronate (FOSAMAX) 70 MG tablet, Take 1 tablet (70 mg total) by mouth every 7 (seven) days. Take with a full glass of water on an empty stomach., Disp: 4 tablet, Rfl: 11 .  benzocaine (ORAJEL) 10 % mucosal gel, Use as directed in the mouth or throat 3 (three) times daily as needed for mouth pain., Disp: 5.3 g, Rfl: 0 .  bisoprolol (ZEBETA) 5 MG tablet, Take 0.5 tablets (2.5 mg total) by mouth daily., Disp: 45 tablet, Rfl: 1 .  budesonide (PULMICORT) 0.5 MG/2ML nebulizer solution, Take 2 mLs (0.5 mg total) by nebulization 2 (two) times daily., Disp: 120 mL, Rfl: 11 .  calcium carbonate (OS-CAL) 600 MG TABS tablet, Take 600 mg by mouth daily with breakfast. , Disp: , Rfl:  .  cetirizine (ZYRTEC) 10 MG tablet, Take 10 mg by mouth daily., Disp: , Rfl:  .  diphenhydrAMINE (BENADRYL) 25 MG tablet, Take 25 mg by mouth at bedtime as needed., Disp: , Rfl:  .  escitalopram (LEXAPRO) 10 MG tablet, TAKE 1 TABLET(10 MG) BY MOUTH DAILY, Disp: 30 tablet, Rfl: 5 .  furosemide  (LASIX) 20 MG tablet, Take 1 tablet (20 mg total) by mouth 2 (two) times daily., Disp: 60 tablet, Rfl: 11 .  guaiFENesin (ROBITUSSIN) 100 MG/5ML SOLN, Take 10 mLs (200 mg total) by mouth every 4 (four) hours as needed for cough or to loosen phlegm., Disp: 1200 mL, Rfl: 0 .  lidocaine (LIDODERM) 5 %, Place 1 patch onto the skin daily. Remove & Discard patch within 12 hours or as directed by MD, Disp: 30 patch, Rfl: 0 .  MULTIPLE VITAMIN PO, Take 1 tablet by mouth daily. , Disp: , Rfl:  .  OXYGEN, Inhale 2.5 L daily into the lungs., Disp: , Rfl:  .  pantoprazole (PROTONIX) 40 MG tablet, TAKE 1 TABLET(40 MG) BY MOUTH DAILY, Disp: 90 tablet, Rfl: 1 .  valACYclovir (VALTREX) 1000 MG tablet, Take 1,000 mg by mouth 2 (two) times daily. , Disp: , Rfl:   Past Medical History: Past Medical History:  Diagnosis Date  . COPD (chronic obstructive pulmonary disease) ([place])   . Diverticulitis 2015  . Essential hypertension   . GERD (gastroesophageal reflux disease)   . Hyperlipidemia   . PAH (pulmonary artery hypertension) ([place])    a. 01/2017 Echo: PASP 34mHg; b. 01/2017 RHC: mild PAH.  . Takotsubo cardiomyopathy    a. 01/2017 Echo: EF 25-30%, sev glob HK w/ basal regions moving well. Gr1 DD. mild MR, mildly dil  LA, nl RV fxn, mild to mod TR, PASP 40mHg; b. 01/2017 Cath: Nl cors w/ EF 35-45%. HK of distal segments suggestive of Takotsubo CM.   .Marland KitchenThyroid disease     Tobacco Use: Social History   Tobacco Use  Smoking Status Former Smoker  . Packs/day: 1.50  . Years: 29.00  . Pack years: 43.50  . Types: Cigarettes  . Last attempt to quit: 04/11/1988  . Years since quitting: 29.2  Smokeless Tobacco Never Used  Tobacco Comment   quit smoking in 1990    Labs: Recent Review Flowsheet Data    Labs for ITP Cardiac and Pulmonary Rehab Latest Ref Rng & Units 11/10/2016 12/14/2016 01/11/2017 01/11/2017 05/22/2017   Cholestrol 0 - 200 mg/dL 151 188 - - 207(H)   LDLCALC 0 - 99 mg/dL 46 84 - - 102(H)    LDLDIRECT mg/dL - - - - -   HDL >39.00 mg/dL 93.80 88.80 - - 87.40   Trlycerides 0.0 - 149.0 mg/dL 56.0 79.0 - - 91.0   Hemoglobin A1c <5.7 % of total Hgb 6.1 - - - 5.8(H)   PHART 7.350 - 7.450 - - - 7.21(L) -   PCO2ART 32.0 - 48.0 mmHg - - - 48 -   HCO3 20.0 - 28.0 mmol/L - - 25.3 19.2(L) -   ACIDBASEDEF 0.0 - 2.0 mmol/L - - 10.5(H) 8.8(H) -   O2SAT % - - 84.6 99.0 -       Pulmonary Assessment Scores: Pulmonary Assessment Scores    Row Name 04/18/17 1122 05/29/17 1140 07/10/17 1235     ADL UCSD   ADL Phase  Entry  Mid  -   SOB Score total  _0 Rest  1  0  0   Walk  0  0  0   Stairs  _1 Bath  0  0  0   Dress  0  0  0   Shop  0  0  0     CAT Score   CAT Score  14  -  6     mMRC Score   mMRC Score  1  -  -      Pulmonary Function Assessment: Pulmonary Function Assessment - 04/18/17 1124      Breath   Bilateral Breath Sounds  Clear    Shortness of Breath  No       Exercise Target Goals:    Exercise Program Goal: Individual exercise prescription set using results from initial 6 min walk test and THRR while considering  patient's activity barriers and safety.    Exercise Prescription Goal: Initial exercise prescription builds to 30-45 minutes a day of aerobic activity, 2-3 days per week.  Home exercise guidelines will be given to patient during program as part of exercise prescription that the participant will acknowledge.  Activity Barriers & Risk Stratification:   6 Minute Walk: 6 Minute Walk    Row Name 04/18/17 1227 07/12/17 1156       6 Minute Walk   Phase  -  Discharge    Distance  1040 feet  1430 feet    Distance % Change  -  37.5 %    Distance Feet Change  -  390 ft    Walk Time  6 minutes  6 minutes    # of Rest Breaks  0  0    MPH  1.97  2.71  METS  1.98  2.78    RPE  10  13    Perceived Dyspnea   1  1    VO2 Peak  6.94  9.74    Symptoms  No  No    Resting HR  78 bpm  73 bpm    Resting BP  134/64  118/62    Resting  Oxygen Saturation   95 %  95 %    Exercise Oxygen Saturation  during 6 min walk  89 %  84 %    Max Ex. HR  96 bpm  109 bpm    Max Ex. BP  130/60  134/62    2 Minute Post BP  120/60  -      Interval HR   1 Minute HR  82  88    2 Minute HR  79  90    3 Minute HR  -  108    4 Minute HR  86  109    5 Minute HR  92  109    6 Minute HR  96  108    2 Minute Post HR  74  -    Interval Heart Rate?  Yes  Yes      Interval Oxygen   Interval Oxygen?  Yes  Yes    Baseline Oxygen Saturation %  95 %  95 %    1 Minute Oxygen Saturation %  94 %  88 %    1 Minute Liters of Oxygen  0 L  0 L Room Air    2 Minute Oxygen Saturation %  89 %  89 %    2 Minute Liters of Oxygen  0 L  0 L    3 Minute Oxygen Saturation %  -  84 %    3 Minute Liters of Oxygen  0 L  0 L    4 Minute Oxygen Saturation %  94 %  91 %    4 Minute Liters of Oxygen  0 L  0 L    5 Minute Oxygen Saturation %  93 %  90 %    5 Minute Liters of Oxygen  0 L  0 L    6 Minute Oxygen Saturation %  91 %  90 %    6 Minute Liters of Oxygen  0 L  0 L    2 Minute Post Oxygen Saturation %  95 %  -    2 Minute Post Liters of Oxygen  0 L  -      Oxygen Initial Assessment: Oxygen Initial Assessment - 04/18/17 1126      Home Oxygen   Home Oxygen Device  Home Concentrator;E-Tanks    Sleep Oxygen Prescription  Continuous    Liters per minute  2.5    Home Exercise Oxygen Prescription  Continuous she uses only when things are strenuous    Liters per minute  2.5    Home at Rest Exercise Oxygen Prescription  Continuous    Liters per minute  2.5    Compliance with Home Oxygen Use  Yes She does not use her oxygen all the time at home. Her oxygen saturations have been better. When she uses exertion she uses oxygen      Initial 6 min Walk   Oxygen Used  None      Program Oxygen Prescription   Program Oxygen Prescription  None      Intervention   Short Term Goals  To learn and understand importance of maintaining oxygen saturations>88%;To learn  and demonstrate proper pursed lip breathing techniques or other breathing techniques.;To learn and understand importance of monitoring SPO2 with pulse oximeter and demonstrate accurate use of the pulse oximeter.;To learn and exhibit compliance with exercise, home and travel O2 prescription;To learn and demonstrate proper use of respiratory medications    Long  Term Goals  Exhibits compliance with exercise, home and travel O2 prescription;Verbalizes importance of monitoring SPO2 with pulse oximeter and return demonstration;Maintenance of O2 saturations>88%;Exhibits proper breathing techniques, such as pursed lip breathing or other method taught during program session;Compliance with respiratory medication;Demonstrates proper use of MDI's       Oxygen Re-Evaluation: Oxygen Re-Evaluation    Row Name 04/26/17 1128 06/02/17 1151 06/30/17 1213         Program Oxygen Prescription   Program Oxygen Prescription  -  None  None       Home Oxygen   Home Oxygen Device  -  Home Concentrator  Home Concentrator     Sleep Oxygen Prescription  -  Continuous  Continuous     Liters per minute  -  2  2     Home Exercise Oxygen Prescription  -  None  None     Liters per minute  -  -  2.5     Home at Rest Exercise Oxygen Prescription  -  None  None     Liters per minute  -  -  2.5     Compliance with Home Oxygen Use  -  Yes  Yes       Goals/Expected Outcomes   Short Term Goals  To learn and demonstrate proper pursed lip breathing techniques or other breathing techniques.;To learn and understand importance of maintaining oxygen saturations>88%  To learn and demonstrate proper pursed lip breathing techniques or other breathing techniques.;To learn and understand importance of maintaining oxygen saturations>88%;To learn and exhibit compliance with exercise, home and travel O2 prescription;To learn and understand importance of monitoring SPO2 with pulse oximeter and demonstrate accurate use of the pulse  oximeter.;To learn and demonstrate proper use of respiratory medications  To learn and demonstrate proper pursed lip breathing techniques or other breathing techniques.;To learn and understand importance of maintaining oxygen saturations>88%;To learn and exhibit compliance with exercise, home and travel O2 prescription;To learn and understand importance of monitoring SPO2 with pulse oximeter and demonstrate accurate use of the pulse oximeter.;To learn and demonstrate proper use of respiratory medications     Long  Term Goals  Maintenance of O2 saturations>88%;Exhibits proper breathing techniques, such as pursed lip breathing or other method taught during program session  Exhibits proper breathing techniques, such as pursed lip breathing or other method taught during program session;Exhibits compliance with exercise, home and travel O2 prescription;Verbalizes importance of monitoring SPO2 with pulse oximeter and return demonstration;Maintenance of O2 saturations>88%;Compliance with respiratory medication;Demonstrates proper use of MDI's  Exhibits proper breathing techniques, such as pursed lip breathing or other method taught during program session;Exhibits compliance with exercise, home and travel O2 prescription;Verbalizes importance of monitoring SPO2 with pulse oximeter and return demonstration;Maintenance of O2 saturations>88%;Compliance with respiratory medication;Demonstrates proper use of MDI's     Comments  Reviewed PLB technique with pt.  Talked about how it work and it's important to maintaining his exercise saturations.    Mary Howe has been doing well with her oxygen theapy at home. The biggest problem is getting tangled in her cannual at night.  She continues to use her  pulse oximeter to monitor her saturations which have all been above 88%.  She uses her nebulizer twice a day and has not needed her inhaler.  She is doing well with her medications. She has continued to use the PLB without queueing.    Mary Howe has been wearing her oxygen at home for th emost part. Some nights she forgets to put it on because she falls asleep in her recliner. She is getting better about checking her pulse ox, which continue to be above 88%. She continues to manage her medication well and hs not needed her inhaler.      Goals/Expected Outcomes  Short: Become more profiecient at using PLB.   Long: Become independent at using PLB.  Short: Continue to be complaint wiht night oxygen and PLB.  Long: Continue to use PLB.   Short: compliance with night oxygen, remembering to place it on before falling asleep. Long: continue to be independent with medication and pulse ox use.         Oxygen Discharge (Final Oxygen Re-Evaluation): Oxygen Re-Evaluation - 06/30/17 1213      Program Oxygen Prescription   Program Oxygen Prescription  None      Home Oxygen   Home Oxygen Device  Home Concentrator    Sleep Oxygen Prescription  Continuous    Liters per minute  2    Home Exercise Oxygen Prescription  None    Liters per minute  2.5    Home at Rest Exercise Oxygen Prescription  None    Liters per minute  2.5    Compliance with Home Oxygen Use  Yes      Goals/Expected Outcomes   Short Term Goals  To learn and demonstrate proper pursed lip breathing techniques or other breathing techniques.;To learn and understand importance of maintaining oxygen saturations>88%;To learn and exhibit compliance with exercise, home and travel O2 prescription;To learn and understand importance of monitoring SPO2 with pulse oximeter and demonstrate accurate use of the pulse oximeter.;To learn and demonstrate proper use of respiratory medications    Long  Term Goals  Exhibits proper breathing techniques, such as pursed lip breathing or other method taught during program session;Exhibits compliance with exercise, home and travel O2 prescription;Verbalizes importance of monitoring SPO2 with pulse oximeter and return demonstration;Maintenance of O2  saturations>88%;Compliance with respiratory medication;Demonstrates proper use of MDI's    Comments  Mary Howe has been wearing her oxygen at home for th emost part. Some nights she forgets to put it on because she falls asleep in her recliner. She is getting better about checking her pulse ox, which continue to be above 88%. She continues to manage her medication well and hs not needed her inhaler.     Goals/Expected Outcomes  Short: compliance with night oxygen, remembering to place it on before falling asleep. Long: continue to be independent with medication and pulse ox use.        Initial Exercise Prescription: Initial Exercise Prescription - 04/18/17 1200      Date of Initial Exercise RX and Referring Provider   Date  04/18/17    Referring Provider  T[place]o      Treadmill   MPH  1.8    Grade  0    Minutes  15    METs  2      NuStep   Level  2    SPM  80    Minutes  15    METs  2      REL-XR   Level  2    Speed  50    Minutes  15    METs  2      Prescription Details   Frequency (times per week)  3    Duration  Progress to 45 minutes of aerobic exercise without signs/symptoms of physical distress      Intensity   THRR 40-80% of Max Heartrate  102-126    Ratings of Perceived Exertion  11-13    Perceived Dyspnea  0-4      Resistance Training   Training Prescription  Yes    Weight  2 lb    Reps  10-15       Perform Capillary Blood Glucose checks as needed.  Exercise Prescription Changes: Exercise Prescription Changes    Row Name 05/10/17 1500 05/23/17 1500 06/05/17 1600 06/20/17 1400 07/05/17 1500     Response to Exercise   Blood Pressure (Admit)  124/62  120/60  112/75  122/72  114/74   Blood Pressure (Exit)  126/64  96/54  106/62  102/50  116/62   Heart Rate (Admit)  74 bpm  75 bpm  78 bpm  69 bpm  72 bpm   Heart Rate (Exercise)  95 bpm  105 bpm  99 bpm  101 bpm  98 bpm   Heart Rate (Exit)  79 bpm  72 bpm  99 bpm  74 bpm  67 bpm   Oxygen Saturation (Admit)   96 %  94 %  96 %  96 %  91 %   Oxygen Saturation (Exercise)  90 %  89 %  90 %  89 %  93 %   Oxygen Saturation (Exit)  93 %  96 %  97 %  98 %  94 %   Rating of Perceived Exertion (Exercise)  _0 Perceived Dyspnea (Exercise)  _1 Symptoms  none  none  none  none  none   Duration  Continue with 45 min of aerobic exercise without signs/symptoms of physical distress.  Continue with 45 min of aerobic exercise without signs/symptoms of physical distress.  Continue with 45 min of aerobic exercise without signs/symptoms of physical distress.  Continue with 45 min of aerobic exercise without signs/symptoms of physical distress.  Continue with 45 min of aerobic exercise without signs/symptoms of physical distress.   Intensity  THRR unchanged  THRR unchanged  THRR unchanged  THRR unchanged  THRR unchanged     Progression   Progression  Continue to progress workloads to maintain intensity without signs/symptoms of physical distress.  Continue to progress workloads to maintain intensity without signs/symptoms of physical distress.  Continue to progress workloads to maintain intensity without signs/symptoms of physical distress.  Continue to progress workloads to maintain intensity without signs/symptoms of physical distress.  Continue to progress workloads to maintain intensity without signs/symptoms of physical distress.   Average METs  3.95  3.7  3.55  4.7  4.63     Resistance Training   Training Prescription  Yes  Yes  Yes  Yes  Yes   Weight  3 lbs  3 lbs  3 lbs  5 lbs  4 lbs   Reps  10-15  10-15  10-15  10-15  10-15     Interval Training   Interval Training  No  No  No  No  No     Treadmill   MPH  2.3  2.4  2.6  2.7  2.7   Grade  0  0._0 2.5   Minutes  _1 METs  2.76  3  3.35  3.81  4     NuStep   Level  _2 SPM  87  92  79  87  91   Minutes  _3 METs  3.5  3.2  3.1  3.6  3.6     REL-XR   Level  _4 Speed  65  60  52  -  -   Minutes  _5 METs  5.6  4.9  4.2  6.7  6.3     Home Exercise Plan   Plans to continue exercise at  Home (comment) walking  Home (comment) walking  Home (comment) walking  Home (comment) walking  Home (comment) walking   Frequency  Add 1 additional day to program exercise sessions.  Add 1 additional day to program exercise sessions.  Add 2 additional days to program exercise sessions.  Add 2 additional days to program exercise sessions.  Add 2 additional days to program exercise sessions.   Initial Home Exercises Provided  05/10/17  05/10/17  05/10/17  05/10/17  05/10/17      Exercise Comments: Exercise Comments    Row Name 04/26/17 1128 07/17/17 1124         Exercise Comments  First full day of exercise!  Patient was oriented to gym and equipment including functions, settings, policies, and procedures.  Patient's individual exercise prescription and treatment plan were reviewed.  All starting workloads were established based on the results of the 6 minute walk test done at initial orientation visit.  The plan for exercise progression was also introduced and progression will be customized based on patient's performance and goals.  Mary Howe graduated today from  rehab with 36 sessions completed.  Details of the patient's exercise prescription and what She needs to do in order to continue the prescription and progress were discussed with patient.  Patient was given a copy of prescription and goals.  Patient verbalized understanding.  Mary Howe plans to continue to exercise by joining [employer].         Exercise Goals and Review: Exercise Goals    Row Name 04/18/17 1227             Exercise Goals   Increase Physical Activity  Yes       Intervention  Provide advice, education, support and counseling about physical activity/exercise needs.;Develop an individualized exercise prescription for aerobic and resistive training based on initial evaluation  findings, risk stratification, comorbidities and participant's personal goals.       Expected Outcomes  Achievement of increased cardiorespiratory fitness and enhanced flexibility, muscular endurance and strength shown through measurements of functional capacity and personal statement of participant.       Increase Strength and Stamina  Yes       Intervention  Provide advice, education, support and counseling about physical activity/exercise needs.;Develop an individualized exercise prescription for aerobic and resistive training based on initial evaluation findings, risk stratification, comorbidities and participant's personal goals.       Expected Outcomes  Achievement of increased cardiorespiratory fitness and enhanced  flexibility, muscular endurance and strength shown through measurements of functional capacity and personal statement of participant.       Able to understand and use rate of perceived exertion (RPE) scale  Yes       Intervention  Provide education and explanation on how to use RPE scale       Expected Outcomes  Short Term: Able to use RPE daily in rehab to express subjective intensity level;Long Term:  Able to use RPE to guide intensity level when exercising independently       Able to understand and use Dyspnea scale  Yes       Intervention  Provide education and explanation on how to use Dyspnea scale       Expected Outcomes  Short Term: Able to use Dyspnea scale daily in rehab to express subjective sense of shortness of breath during exertion;Long Term: Able to use Dyspnea scale to guide intensity level when exercising independently       Knowledge and understanding of Target Heart Rate Range (THRR)  Yes       Intervention  Provide education and explanation of THRR including how the numbers were predicted and where they are located for reference       Expected Outcomes  Short Term: Able to state/look up THRR;Long Term: Able to use THRR to govern intensity when exercising  independently;Short Term: Able to use daily as guideline for intensity in rehab       Able to check pulse independently  Yes       Intervention  Provide education and demonstration on how to check pulse in carotid and radial arteries.;Review the importance of being able to check your own pulse for safety during independent exercise       Expected Outcomes  Short Term: Able to explain why pulse checking is important during independent exercise;Long Term: Able to check pulse independently and accurately       Understanding of Exercise Prescription  Yes       Intervention  Provide education, explanation, and written materials on patient's individual exercise prescription       Expected Outcomes  Short Term: Able to explain program exercise prescription;Long Term: Able to explain home exercise prescription to exercise independently          Exercise Goals Re-Evaluation : Exercise Goals Re-Evaluation    Row Name 04/26/17 1127 05/10/17 1232 05/23/17 1526 06/02/17 1147 06/05/17 1631     Exercise Goal Re-Evaluation   Exercise Goals Review  Able to understand and use rate of perceived exertion (RPE) scale;Knowledge and understanding of Target Heart Rate Range (THRR);Able to understand and use Dyspnea scale;Understanding of Exercise Prescription  Increase Physical Activity;Able to understand and use Dyspnea scale;Understanding of Exercise Prescription;Increase Strength and Stamina;Knowledge and understanding of Target Heart Rate Range (THRR);Able to understand and use rate of perceived exertion (RPE) scale;Able to check pulse independently  Increase Physical Activity;Understanding of Exercise Prescription;Increase Strength and Stamina  Increase Physical Activity;Understanding of Exercise Prescription;Increase Strength and Stamina  Increase Physical Activity;Understanding of Exercise Prescription;Increase Strength and Stamina   Comments  Reviewed RPE scale, THR and program prescription with pt today.  Pt voiced  understanding and was given a copy of goals to take home.   Mary Howe is off to a good start in rehab. She is starting to notice an increase in her strength and stamina.  We talked about adding in home exercise to help.  Reviewed home exercise with pt today.  Pt plans to walk at  home for exercise.  Reviewed THR, pulse, RPE, sign and symptoms, and when to call 911 or MD.  Also discussed weather considerations and indoor options.  Pt voiced understanding.  Mary Howe continues to do well in rehab.  Se  is now up to 2.4 mph on the treadmill.  We will attempt to increase her workload on the XR as she was able to do level 4 one day.  We will continue to monitor her progression.   Mary Howe is doing well in rehab.  She is feeling stronger and has more stamina.  She has been going up and down the step more often.  She does the leg and arm exercises daily and walks 2-3 extra days a week. Overall she is feeling better.   Mary Howe continues to do well in rehab.  She is now up to level 4 on the NuStep.  We will be moving up her workload on the treadmill at her next visit.  We will continue to monitor her progression.    Expected Outcomes  Short: Use RPE daily to regulate intensity.  Long: Follow program prescription in THR.  Short: Add in at least one extra day of walking at home.  Long: Continue to increase physical activity  Short: Continue to try to walk some at home.   Long: Continue to work on increase activity levels.   Short: Continue to focus on getting her walk in at home at least two days every week.   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   Short: Increase workload on treadmill and XR.  Long: Continue to walk at home on off days.    [place] Name 06/20/17 1447 07/05/17 1458           Exercise Goal Re-Evaluation   Exercise Goals Review  Increase Physical Activity;Understanding of Exercise Prescription;Increase Strength and Stamina  Increase Physical Activity;Understanding of Exercise  Prescription;Increase Strength and Stamina      Comments  Mary Howe has been doing well in rehab.  She is now up to 3.5 METs on the NuStep and 6.7 on the XR.  Tomorrow she will do 2.5% grade on the treadmill for her full 15 min versus just the last five minutes.  We did a challenge day to increase weigthts and she has stuck with her heavier weigths!!  We will continue to monitor her progression.   Mary Howe is nearing graduation.  She is interested in continuing to exercise either in [employer] or in the [place].  She is going to talk with her daughter about her options and which may suit her the best. She has continued to use the 4 lbs weights!!  We will continue to monitor her progress.       Expected Outcomes  Short: Increase level on XR.  Long: Continue to exercise independently  Short: Improve post 6MWT!  Long: Continue to increase strength and stamina.          Discharge Exercise Prescription (Final Exercise Prescription Changes): Exercise Prescription Changes - 07/05/17 1500      Response to Exercise   Blood Pressure (Admit)  114/74    Blood Pressure (Exit)  116/62    Heart Rate (Admit)  72 bpm    Heart Rate (Exercise)  98 bpm    Heart Rate (Exit)  67 bpm    Oxygen Saturation (Admit)  91 %    Oxygen Saturation (Exercise)  93 %    Oxygen Saturation (Exit)  94 %    Rating of  Perceived Exertion (Exercise)  13    Perceived Dyspnea (Exercise)  1    Symptoms  none    Duration  Continue with 45 min of aerobic exercise without signs/symptoms of physical distress.    Intensity  THRR unchanged      Progression   Progression  Continue to progress workloads to maintain intensity without signs/symptoms of physical distress.    Average METs  4.63      Resistance Training   Training Prescription  Yes    Weight  4 lbs    Reps  10-15      Interval Training   Interval Training  No      Treadmill   MPH  2.7    Grade  2.5    Minutes  15    METs  4      NuStep   Level  4    SPM  91     Minutes  15    METs  3.6      REL-XR   Level  3    Minutes  15    METs  6.3      Home Exercise Plan   Plans to continue exercise at  Home (comment) walking    Frequency  Add 2 additional days to program exercise sessions.    Initial Home Exercises Provided  05/10/17       Nutrition:  Target Goals: Understanding of nutrition guidelines, daily intake of sodium <1531m, cholesterol <2026m calories 30% from fat and 7% or less from saturated fats, daily to have 5 or more servings of fruits and vegetables.  Biometrics: Pre Biometrics - 04/18/17 1226      Pre Biometrics   Height  _0  (1.626 m)    Weight  127 lb 9.6 oz (57.9 kg)    Waist Circumference  29.75 inches    Hip Circumference  39 inches    Waist to Hip Ratio  0.76 %    BMI (Calculated)  21.89        Nutrition Therapy Plan and Nutrition Goals: Nutrition Therapy & Goals - 04/18/17 1121      Personal Nutrition Goals   Comments  She plans to meet with the dietician. She states she eats pretty well and watches what she eats.       Intervention Plan   Intervention  Nutrition handout(s) given to patient.;Prescribe, educate and counsel regarding individualized specific dietary modifications aiming towards targeted core components such as weight, hypertension, lipid management, diabetes, heart failure and other comorbidities.    Expected Outcomes  Short Term Goal: Understand basic principles of dietary content, such as calories, fat, sodium, cholesterol and nutrients.;Long Term Goal: Adherence to prescribed nutrition plan.       Nutrition Assessments: Nutrition Assessments - 07/10/17 1237      MEDFICTS Scores   Pre Score  44       Nutrition Goals Re-Evaluation: Nutrition Goals Re-Evaluation    [place] Name 05/17/17 1213 06/02/17 1159 06/30/17 1209         Goals   Nutrition Goal  Meet with dietician to make sure she is eating enough and eating well.   Meet with dietician to make sure she is eating enough and eating  well.   Elli's dietician appointment is rescheduled for 3/27. She was encouraged to write down questions/topics to discuss with LiLattie Haws she thinks of them so she doesn't forget.     Comment  MiMarrionas not yet met with dietician.  She was waiting to see if her daughter wanted to go the appointment with her, but she forgets to ask.  Appointment scheduled for 2/13 during class.   Appointment moved to 2/27 during class.   -     Expected Outcome  Short: Meet with dietician.  Long: Follow recommendations outlined by dietician.  Short: Meet with dietician.  Long: Follow recommendations outlined by dietician.  Short: meet with dietician. Long: follow recommendations set with dietician and work on meeting goals.         Nutrition Goals Discharge (Final Nutrition Goals Re-Evaluation): Nutrition Goals Re-Evaluation - 06/30/17 1209      Goals   Nutrition Goal  Odeth's dietician appointment is rescheduled for 3/27. She was encouraged to write down questions/topics to discuss with Lattie Haw as she thinks of them so she doesn't forget.    Expected Outcome  Short: meet with dietician. Long: follow recommendations set with dietician and work on meeting goals.        Psychosocial: Target Goals: Acknowledge presence or absence of significant depression and/or stress, maximize coping skills, provide positive support system. Participant is able to verbalize types and ability to use techniques and skills needed for reducing stress and depression.   Initial Review & Psychosocial Screening: Initial Psych Review & Screening - 04/18/17 1116      Initial Review   Current issues with  Current Stress Concerns    Source of Stress Concerns  Unable to perform yard/household activities    Comments  Under some stress. Her sister that she lives with is 73 and sometimes she gets on her nerves. She does not get short of breath that often but sometimes she gets short of breath when she is grocery shopping. She moved from  [place] in 2014.       Family Dynamics   Good Support System?  Yes    Comments  Her daughter helps her when she needs it.       Barriers   Psychosocial barriers to participate in program  The patient should benefit from training in stress management and relaxation.      Screening Interventions   Interventions  Yes;Encouraged to exercise;Program counselor consult;Provide feedback about the scores to participant;To provide support and resources with identified psychosocial needs    Expected Outcomes  Short Term goal: Utilizing psychosocial counselor, staff and physician to assist with identification of specific Stressors or current issues interfering with healing process. Setting desired goal for each stressor or current issue identified.;Long Term Goal: Stressors or current issues are controlled or eliminated.;Short Term goal: Identification and review with participant of any Quality of Life or Depression concerns found by scoring the questionnaire.;Long Term goal: The participant improves quality of Life and PHQ9 Scores as seen by post scores and/or verbalization of changes       Quality of Life Scores:  Scores of 19 and below usually indicate a poorer quality of life in these areas.  A difference of  2-3 points is a clinically meaningful difference.  A difference of 2-3 points in the total score of the Quality of Life Index has been associated with significant improvement in overall quality of life, self-image, physical symptoms, and general health in studies assessing change in quality of life.  PHQ-9: Recent Review Flowsheet Data    Depression screen [facility] 2/9 07/10/2017 05/29/2017 04/18/2017 09/20/2016 06/22/2016   Decreased Interest 0 0 0 0 0   Down, Depressed, Hopeless 0 0 0 0 0   PHQ - 2 Score  0 0 0 0 0   Altered sleeping 0 0 0 - -   Tired, decreased energy 0 0 0 - -   Change in appetite 0 0 0 - -   Feeling bad or failure about yourself  0 0 0 - -   Trouble concentrating 0 0 0 - -    Moving slowly or fidgety/restless 0 0 0 - -   Suicidal thoughts 0 0 0 - -   PHQ-9 Score 0 0 0 - -   Difficult doing work/chores Not difficult at all Not difficult at all Not difficult at all - -     Interpretation of Total Score  Total Score Depression Severity:  1-4 = Minimal depression, 5-9 = Mild depression, 10-14 = Moderate depression, 15-19 = Moderately severe depression, 20-27 = Severe depression   Psychosocial Evaluation and Intervention: Psychosocial Evaluation - 05/17/17 1223      Psychosocial Evaluation & Interventions   Interventions  Encouraged to exercise with the program and follow exercise prescription;Stress management education;Relaxation education    Comments  Counselor met with Ms. Stegmaier [facility]) today for initial psychosocial evaluation.  She is an 84 year old who has emphysema. She has a strong support system with a daughter close by; lives with her older sister; and is actively involved in her local church.  Saide states she sleeps well but has a sleep study scheduled soon since she uses 0xygen at night.  She has a fair appetite and denies a history of depression or anxiety or any current symptoms.  Deidrea states she is typically in a positive mood and has minimal stress in her life - other than her health and challenges living with her old sister at times.  She has goals to breathe better and improve her overall quality of life while in this program.  Staff will follow with her.     Expected Outcomes  Azriel will benefit from consistent exercise to achieve her stated goals.  The educational and psychoeducational parts of this program will be helpful in understanding her condition and coping more positively in general.      Continue Psychosocial Services   Follow up required by staff       Psychosocial Re-Evaluation: Psychosocial Re-Evaluation    [place] Name 06/02/17 1156 06/14/17 1159 06/30/17 1210         Psychosocial Re-Evaluation   Current issues with  Current  Stress Concerns  Current Stress Concerns  Current Stress Concerns     Comments  Mary Howe continues to care for her sister and that has been her biggest stressor.  She continues to learn when to just walk away.  She has found coming to class to be helpful to get out and about.  Mary Howe is also able to do more now with less shortness of breath.  She is remaining postive and coping the best she can with her sister.  Counselor follow with Everlene Farrier today after attending the Stress and anxiety education.  She reports having learned new ways of coping with her older sister and actually has become more compassionate - realizing her 34 year old sister is not going to change.  Albertina is learning to let things go and is enjoying working out for stress.  She is experiencing progress with more energy and breathing better.  Counselor commended [place] on her progress made in positive self-care.  Staff will continue to follow with her  Mary Howe is starting to handle her older sister better. She is practicing the  stress reduction techniques taught in class with success. She was encouraged to remember to take care and special time for herself. She is planning a summer break, where another family member will care for her sister for a couple days. She also has some day trips planned with her sister.      Expected Outcomes  Short: Continue to cope with her sister postively.   Long: Continue to remain postive.   Mary Howe will continue to practice stress management in coping with her older sister.  She will continue to exercise for physiological benefits as well as mental and emotional benefits.    Short: Brynli will continue to practice stress/relaxation techniques. Long: she will take time for self-care and continue to exericse to help with emotional management.      Interventions  Stress management education;Encouraged to attend Pulmonary Rehabilitation for the exercise  -  -     Continue Psychosocial Services   Follow up required  by staff  -  -     Comments  Under some stress. Her sister that she lives with is 63 and sometimes she gets on her nerves. She does not get short of breath that often but sometimes she gets short of breath when she is grocery shopping. She moved from [place] in 2014.   -  -       Initial Review   Source of Stress Concerns  Family  -  -        Psychosocial Discharge (Final Psychosocial Re-Evaluation): Psychosocial Re-Evaluation - 06/30/17 1210      Psychosocial Re-Evaluation   Current issues with  Current Stress Concerns    Comments  Mary Howe is starting to handle her older sister better. She is practicing the stress reduction techniques taught in class with success. She was encouraged to remember to take care and special time for herself. She is planning a summer break, where another family member will care for her sister for a couple days. She also has some day trips planned with her sister.     Expected Outcomes  Short: Myalynn will continue to practice stress/relaxation techniques. Long: she will take time for self-care and continue to exericse to help with emotional management.        Education: Education Goals: Education classes will be provided on a weekly basis, covering required topics. Participant will state understanding/return demonstration of topics presented.  Learning Barriers/Preferences: Learning Barriers/Preferences - 04/18/17 1126      Learning Barriers/Preferences   Learning Barriers  Sight wears glasses    Learning Preferences  None       Education Topics:  Initial Evaluation Education: - Verbal, written and demonstration of respiratory meds, oximetry and breathing techniques. Instruction on use of nebulizers and MDIs and importance of monitoring MDI activations.   Pulmonary Rehab from 07/10/2017 in [facility] Cardiac and Pulmonary Rehab  Date  04/18/17  Educator  [facility]  Instruction Review Code  1- Verbalizes Understanding      General Nutrition Guidelines/Fats and  Fiber: -Group instruction provided by verbal, written material, models and posters to present the general guidelines for heart healthy nutrition. Gives an explanation and review of dietary fats and fiber.   Pulmonary Rehab from 07/10/2017 in [facility] Cardiac and Pulmonary Rehab  Date  07/03/17  Educator  [facility]  Instruction Review Code  1- Verbalizes Understanding      Controlling Sodium/Reading Food Labels: -Group verbal and written material supporting the discussion of sodium use in heart healthy nutrition. Review and explanation with  models, verbal and written materials for utilization of the food label.   Pulmonary Rehab from 07/10/2017 in [facility] Cardiac and Pulmonary Rehab  Date  07/10/17  Educator  CR  Instruction Review Code  1- Verbalizes Understanding      Exercise Physiology & General Exercise Guidelines: - Group verbal and written instruction with models to review the exercise physiology of the cardiovascular system and associated critical values. Provides general exercise guidelines with specific guidelines to those with heart or lung disease.    Aerobic Exercise & Resistance Training: - Gives group verbal and written instruction on the various components of exercise. Focuses on aerobic and resistive training programs and the benefits of this training and how to safely progress through these programs.   Pulmonary Rehab from 07/10/2017 in [facility] Cardiac and Pulmonary Rehab  Date  04/28/17  Educator  [facility]  Instruction Review Code  1- Verbalizes Understanding      Flexibility, Balance, Mind/Body Relaxation: Provides group verbal/written instruction on the benefits of flexibility and balance training, including mind/body exercise modes such as yoga, pilates and tai chi.  Demonstration and skill practice provided.   Pulmonary Rehab from 07/10/2017 in [facility] Cardiac and Pulmonary Rehab  Date  05/24/17  Educator  [facility]  Instruction Review Code  1- Verbalizes Understanding      Stress and  Anxiety: - Provides group verbal and written instruction about the health risks of elevated stress and causes of high stress.  Discuss the correlation between heart/lung disease and anxiety and treatment options. Review healthy ways to manage with stress and anxiety.   Pulmonary Rehab from 07/10/2017 in [facility] Cardiac and Pulmonary Rehab  Date  06/14/17  Educator  [facility]  Instruction Review Code  1- Verbalizes Understanding      Depression: - Provides group verbal and written instruction on the correlation between heart/lung disease and depressed mood, treatment options, and the stigmas associated with seeking treatment.   Pulmonary Rehab from 07/10/2017 in [facility] Cardiac and Pulmonary Rehab  Date  05/31/17  Educator  [facility]  Instruction Review Code  1- Verbalizes Understanding      Exercise & Equipment Safety: - Individual verbal instruction and demonstration of equipment use and safety with use of the equipment.   Pulmonary Rehab from 07/10/2017 in [facility] Cardiac and Pulmonary Rehab  Date  04/18/17  Educator  [facility]  Instruction Review Code  1- Verbalizes Understanding      Infection Prevention: - Provides verbal and written material to individual with discussion of infection control including proper hand washing and proper equipment cleaning during exercise session.   Pulmonary Rehab from 07/10/2017 in [facility] Cardiac and Pulmonary Rehab  Date  04/18/17  Educator  [facility]  Instruction Review Code  1- Verbalizes Understanding      Falls Prevention: - Provides verbal and written material to individual with discussion of falls prevention and safety.   Pulmonary Rehab from 07/10/2017 in [facility] Cardiac and Pulmonary Rehab  Date  04/18/17  Educator  [facility]  Instruction Review Code  1- Verbalizes Understanding      Diabetes: - Individual verbal and written instruction to review signs/symptoms of diabetes, desired ranges of glucose level fasting, after meals and with exercise. Advice that pre and post exercise  glucose checks will be done for 3 sessions at entry of program.   Chronic Lung Diseases: - Group verbal and written instruction to review updates, respiratory medications, advancements in procedures and treatments. Discuss use of supplemental oxygen including available portable oxygen systems, continuous  and intermittent flow rates, concentrators, personal use and safety guidelines. Review proper use of inhaler and spacers. Provide informative websites for self-education.    Pulmonary Rehab from 07/10/2017 in [facility] Cardiac and Pulmonary Rehab  Date  07/05/17  Educator  [facility]  Instruction Review Code  1- Verbalizes Understanding      Energy Conservation: - Provide group verbal and written instruction for methods to conserve energy, plan and organize activities. Instruct on pacing techniques, use of adaptive equipment and posture/positioning to relieve shortness of breath.   Pulmonary Rehab from 07/10/2017 in [facility] Cardiac and Pulmonary Rehab  Date  06/07/17  Educator  [facility]  Instruction Review Code  1- Verbalizes Understanding      Triggers and Exacerbations: - Group verbal and written instruction to review types of environmental triggers and ways to prevent exacerbations. Discuss weather changes, air quality and the benefits of nasal washing. Review warning signs and symptoms to help prevent infections. Discuss techniques for effective airway clearance, coughing, and vibrations.   Pulmonary Rehab from 07/10/2017 in [facility] Cardiac and Pulmonary Rehab  Date  04/26/17  Educator  [facility]  Instruction Review Code  1- Verbalizes Understanding      AED/CPR: - Group verbal and written instruction with the use of models to demonstrate the basic use of the AED with the basic ABC's of resuscitation.   Pulmonary Rehab from 07/10/2017 in [facility] Cardiac and Pulmonary Rehab  Date  06/09/17  Educator  [facility]  Instruction Review Code  1- Actuary and Physiology of the Lungs: - Group verbal and  written instruction with the use of models to provide basic lung anatomy and physiology related to function, structure and complications of lung disease.   Pulmonary Rehab from 07/10/2017 in [facility] Cardiac and Pulmonary Rehab  Date  06/21/17  Educator  [facility]  Instruction Review Code  1- Verbalizes Understanding      Anatomy & Physiology of the Heart: - Group verbal and written instruction and models provide basic cardiac anatomy and physiology, with the coronary electrical and arterial systems. Review of Valvular disease and Heart Failure   Pulmonary Rehab from 07/10/2017 in [facility] Cardiac and Pulmonary Rehab  Date  05/10/17  Educator  [facility]  Instruction Review Code  1- Verbalizes Understanding      Cardiac Medications: - Group verbal and written instruction to review commonly prescribed medications for heart disease. Reviews the medication, class of the drug, and side effects.   Know Your Numbers and Risk Factors: -Group verbal and written instruction about important numbers in your health.  Discussion of what are risk factors and how they play a role in the disease process.  Review of Cholesterol, Blood Pressure, Diabetes, and BMI and the role they play in your overall health.   Sleep Hygiene: -Provides group verbal and written instruction about how sleep can affect your health.  Define sleep hygiene, discuss sleep cycles and impact of sleep habits. Review good sleep hygiene tips.    Other: -Provides group and verbal instruction on various topics (see comments)   Pulmonary Rehab from 07/10/2017 in [facility] Cardiac and Pulmonary Rehab  Date  05/03/17  Educator  [facility]  Instruction Review Code  1- Verbalizes Understanding [SLEEP]       Knowledge Questionnaire Score: Knowledge Questionnaire Score - 07/10/17 1236      Knowledge Questionnaire Score   Pre Score  13/18    Post Score  15/18 reviewed with patient  Core Components/Risk Factors/Patient Goals at Admission: Personal Goals and  Risk Factors at Admission - 04/18/17 1136      Core Components/Risk Factors/Patient Goals on Admission    Weight Management  Yes;Weight Maintenance    Intervention  Weight Management: Develop a combined nutrition and exercise program designed to reach desired caloric intake, while maintaining appropriate intake of nutrient and fiber, sodium and fats, and appropriate energy expenditure required for the weight goal.;Weight Management: Provide education and appropriate resources to help participant work on and attain dietary goals.;Weight Management/Obesity: Establish reasonable short term and long term weight goals.    Admit Weight  127 lb 9.6 oz (57.9 kg)    Goal Weight: Short Term  127 lb (57.6 kg)    Goal Weight: Long Term  127 lb (57.6 kg)    Expected Outcomes  Short Term: Continue to assess and modify interventions until short term weight is achieved;Long Term: Adherence to nutrition and physical activity/exercise program aimed toward attainment of established weight goal;Weight Maintenance: Understanding of the daily nutrition guidelines, which includes 25-35% calories from fat, 7% or less cal from saturated fats, less than '200mg'$  cholesterol, less than 1.5gm of sodium, & 5 or more servings of fruits and vegetables daily;Understanding recommendations for meals to include 15-35% energy as protein, 25-35% energy from fat, 35-60% energy from carbohydrates, less than '200mg'$  of dietary cholesterol, 20-35 gm of total fiber daily;Understanding of distribution of calorie intake throughout the day with the consumption of 4-5 meals/snacks    Improve shortness of breath with ADL's  Yes    Intervention  Provide education, individualized exercise plan and daily activity instruction to help decrease symptoms of SOB with activities of daily living.    Expected Outcomes  Short Term: Achieves a reduction of symptoms when performing activities of daily living.    Heart Failure  Yes    Intervention  Provide a combined  exercise and nutrition program that is supplemented with education, support and counseling about heart failure. Directed toward relieving symptoms such as shortness of breath, decreased exercise tolerance, and extremity edema.    Expected Outcomes  Improve functional capacity of life;Short term: Attendance in program 2-3 days a week with increased exercise capacity. Reported lower sodium intake. Reported increased fruit and vegetable intake. Reports medication compliance.;Short term: Daily weights obtained and reported for increase. Utilizing diuretic protocols set by physician.;Long term: Adoption of self-care skills and reduction of barriers for early signs and symptoms recognition and intervention leading to self-care maintenance.    Hypertension  Yes takes medication     Intervention  Provide education on lifestyle modifcations including regular physical activity/exercise, weight management, moderate sodium restriction and increased consumption of fresh fruit, vegetables, and low fat dairy, alcohol moderation, and smoking cessation.;Monitor prescription use compliance.    Expected Outcomes  Short Term: Continued assessment and intervention until BP is < 140/8m HG in hypertensive participants. < 130/867mHG in hypertensive participants with diabetes, heart failure or chronic kidney disease.;Long Term: Maintenance of blood pressure at goal levels.       Core Components/Risk Factors/Patient Goals Review:  Goals and Risk Factor Review    Row Name 05/17/17 1222 06/02/17 1149 06/30/17 1205         Core Components/Risk Factors/Patient Goals Review   Personal Goals Review  Weight Management/Obesity;Improve shortness of breath with ADL's;Heart Failure;Hypertension;Stress  Weight Management/Obesity;Improve shortness of breath with ADL's;Heart Failure;Hypertension  Weight Management/Obesity;Heart Failure;Improve shortness of breath with ADL's     Review  Mary Howe that her doctor  wants her blood  pressure to be below 106 for systolic. Her weight has been good and she is interested in maintaining it. 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 She does not notice herself getting short of breath at home as much.  Elizabethanne has been doing well in rehab.  SOB at home is getting better and she is able to do more at home.   Her blood pressures have been  good and she is checking them at home.   Usually gets them in the 120-130s.  Her weight has been stable as well.  She has not had any heart failure sysmptoms.   Mary Howe has been able to tell a difference in her SOB since starting [facility]. She feels like she has the energy and the breathing ability to do more around the house and outside the home. Her pressures have been running consistent (120s-130s) and she continues to check them at home. Her weight was up a couple pounds this week, but her doctor told her to take her afternoon dose when she needs it, like this week when her weight is up. She is managing her HF and is aware of her symptoms, as she weighs herself daiy and checks for swelling.      Expected Outcomes  Short: attend [facility] to improve ADL's.  Long: maintain exercise to reduce stress and Improve ADL.  Short: Continue to work on improving SOB for ADLs.  Long: Continue to monitor blood pressures regularly.   Short: continue to work on SOB and her weight will go back to baseline, around 125 lb. Long: indpendently manage blood pressure and heart failure symptoms, continue to communicate effectively with doctor regarding symptoms.         Core Components/Risk Factors/Patient Goals at Discharge (Final Review):  Goals and Risk Factor Review - 06/30/17 1205      Core Components/Risk Factors/Patient Goals Review   Personal Goals Review  Weight Management/Obesity;Heart Failure;Improve shortness of breath with ADL's    Review  Mary Howe has been able to tell a difference in her SOB since starting [facility]. She feels  like she has the energy and the breathing ability to do more around the house and outside the home. Her pressures have been running consistent (120s-130s) and she continues to check them at home. Her weight was up a couple pounds this week, but her doctor told her to take her afternoon dose when she needs it, like this week when her weight is up. She is managing her HF and is aware of her symptoms, as she weighs herself daiy and checks for swelling.     Expected Outcomes  Short: continue to work on SOB and her weight will go back to baseline, around 125 lb. Long: indpendently manage blood pressure and heart failure symptoms, continue to communicate effectively with doctor regarding symptoms.        ITP Comments: ITP Comments    Row Name 04/18/17 1058 04/24/17 0835 05/22/17 0934 06/19/17 0836 07/17/17 0859   ITP Comments  Medical Evaluation completed. Chart sent for review and changes to Dr. Emily Filbert Director of [facility]. Diagnosis can be found in [facility] encounter 04/18/17  30 day review completed. ITP sent to Dr. Emily Filbert Director of [facility]. Continue with ITP unless changes are made by physician.  30 day review completed. ITP sent to Dr. Emily Filbert Director of [facility]. Continue with ITP unless changes are made by physician.  30 day review completed. ITP  sent to Dr. Emily Filbert Director of [facility]. Continue with ITP unless changes are made by physician.   30 day review completed. ITP sent to Dr. Emily Filbert Director of [facility]. Continue with ITP unless changes are made by physician   Row Name 07/17/17 1124 07/17/17 1125         ITP Comments  Mary Howe graduated today from  rehab with 36 sessions completed.  Details of the patient's exercise prescription and what She needs to do in order to continue the prescription and progress were discussed with patient.  Patient was given a copy of prescription and goals.  Patient verbalized understanding.  Mary Howe plans to continue to exercise by joining  [facility].  Discharge ITP sent and signed by Dr. Sabra Heck.  Discharge Summary routed to PCP and pulmonologist.         Comments: Discharge ITP

## 2017-07-17 NOTE — Patient Instructions (Signed)
Discharge Patient Instructions  Patient Details  Name: Mary Howe MRN: [id number] Date of Birth: 1933/03/01 Referring Provider:  [name], MD   Number of Visits: 36/36  Reason for Discharge:  Patient reached a stable level of exercise. Patient independent in their exercise. Patient has met program and personal goals.  Smoking History:  Social History   Tobacco Use  Smoking Status Former Smoker  . Packs/day: 1.50  . Years: 29.00  . Pack years: 43.50  . Types: Cigarettes  . Last attempt to quit: 04/11/1988  . Years since quitting: 29.2  Smokeless Tobacco Never Used  Tobacco Comment   quit smoking in 1990    Diagnosis:  Centrilobular emphysema ([place])  Initial Exercise Prescription: Initial Exercise Prescription - 04/18/17 1200      Date of Initial Exercise RX and Referring Provider   Date  04/18/17    Referring Provider  [place]      Treadmill   MPH  1.8    Grade  0    Minutes  15    METs  2      NuStep   Level  2    SPM  80    Minutes  15    METs  2      REL-XR   Level  2    Speed  50    Minutes  15    METs  2      Prescription Details   Frequency (times per week)  3    Duration  Progress to 45 minutes of aerobic exercise without signs/symptoms of physical distress      Intensity   THRR 40-80% of Max Heartrate  102-126    Ratings of Perceived Exertion  11-13    Perceived Dyspnea  0-4      Resistance Training   Training Prescription  Yes    Weight  2 lb    Reps  10-15       Discharge Exercise Prescription (Final Exercise Prescription Changes): Exercise Prescription Changes - 07/05/17 1500      Response to Exercise   Blood Pressure (Admit)  114/74    Blood Pressure (Exit)  116/62    Heart Rate (Admit)  72 bpm    Heart Rate (Exercise)  98 bpm    Heart Rate (Exit)  67 bpm    Oxygen Saturation (Admit)  91 %    Oxygen Saturation (Exercise)  93 %    Oxygen Saturation (Exit)  94 %    Rating of Perceived Exertion (Exercise)  13     Perceived Dyspnea (Exercise)  1    Symptoms  none    Duration  Continue with 45 min of aerobic exercise without signs/symptoms of physical distress.    Intensity  THRR unchanged      Progression   Progression  Continue to progress workloads to maintain intensity without signs/symptoms of physical distress.    Average METs  4.63      Resistance Training   Training Prescription  Yes    Weight  4 lbs    Reps  10-15      Interval Training   Interval Training  No      Treadmill   MPH  2.7    Grade  2.5    Minutes  15    METs  4      NuStep   Level  4    SPM  91    Minutes  15    METs  3.6      REL-XR   Level  3    Minutes  15    METs  6.3      Home Exercise Plan   Plans to continue exercise at  Home (comment) walking    Frequency  Add 2 additional days to program exercise sessions.    Initial Home Exercises Provided  05/10/17       Functional Capacity: 6 Minute Walk    Row Name 04/18/17 1227 07/12/17 1156       6 Minute Walk   Phase  -  Discharge    Distance  1040 feet  1430 feet    Distance % Change  -  37.5 %    Distance Feet Change  -  390 ft    Walk Time  6 minutes  6 minutes    # of Rest Breaks  0  0    MPH  1.97  2.71    METS  1.98  2.78    RPE  10  13    Perceived Dyspnea   1  1    VO2 Peak  6.94  9.74    Symptoms  No  No    Resting HR  78 bpm  73 bpm    Resting BP  134/64  118/62    Resting Oxygen Saturation   95 %  95 %    Exercise Oxygen Saturation  during 6 min walk  89 %  84 %    Max Ex. HR  96 bpm  109 bpm    Max Ex. BP  130/60  134/62    2 Minute Post BP  120/60  -      Interval HR   1 Minute HR  82  88    2 Minute HR  79  90    3 Minute HR  -  108    4 Minute HR  86  109    5 Minute HR  92  109    6 Minute HR  96  108    2 Minute Post HR  74  -    Interval Heart Rate?  Yes  Yes      Interval Oxygen   Interval Oxygen?  Yes  Yes    Baseline Oxygen Saturation %  95 %  95 %    1 Minute Oxygen Saturation %  94 %  88 %    1 Minute  Liters of Oxygen  0 L  0 L Room Air    2 Minute Oxygen Saturation %  89 %  89 %    2 Minute Liters of Oxygen  0 L  0 L    3 Minute Oxygen Saturation %  -  84 %    3 Minute Liters of Oxygen  0 L  0 L    4 Minute Oxygen Saturation %  94 %  91 %    4 Minute Liters of Oxygen  0 L  0 L    5 Minute Oxygen Saturation %  93 %  90 %    5 Minute Liters of Oxygen  0 L  0 L    6 Minute Oxygen Saturation %  91 %  90 %    6 Minute Liters of Oxygen  0 L  0 L    2 Minute Post Oxygen Saturation %  95 %  -    2 Minute Post Liters of Oxygen  0 L  -  Quality of Life:   Personal Goals: Goals established at orientation with interventions provided to work toward goal. Personal Goals and Risk Factors at Admission - 04/18/17 1136      Core Components/Risk Factors/Patient Goals on Admission    Weight Management  Yes;Weight Maintenance    Intervention  Weight Management: Develop a combined nutrition and exercise program designed to reach desired caloric intake, while maintaining appropriate intake of nutrient and fiber, sodium and fats, and appropriate energy expenditure required for the weight goal.;Weight Management: Provide education and appropriate resources to help participant work on and attain dietary goals.;Weight Management/Obesity: Establish reasonable short term and long term weight goals.    Admit Weight  127 lb 9.6 oz (57.9 kg)    Goal Weight: Short Term  127 lb (57.6 kg)    Goal Weight: Long Term  127 lb (57.6 kg)    Expected Outcomes  Short Term: Continue to assess and modify interventions until short term weight is achieved;Long Term: Adherence to nutrition and physical activity/exercise program aimed toward attainment of established weight goal;Weight Maintenance: Understanding of the daily nutrition guidelines, which includes 25-35% calories from fat, 7% or less cal from saturated fats, less than 252m cholesterol, less than 1.5gm of sodium, & 5 or more servings of fruits and vegetables  daily;Understanding recommendations for meals to include 15-35% energy as protein, 25-35% energy from fat, 35-60% energy from carbohydrates, less than 2049mof dietary cholesterol, 20-35 gm of total fiber daily;Understanding of distribution of calorie intake throughout the day with the consumption of 4-5 meals/snacks    Improve shortness of breath with ADL's  Yes    Intervention  Provide education, individualized exercise plan and daily activity instruction to help decrease symptoms of SOB with activities of daily living.    Expected Outcomes  Short Term: Achieves a reduction of symptoms when performing activities of daily living.    Heart Failure  Yes    Intervention  Provide a combined exercise and nutrition program that is supplemented with education, support and counseling about heart failure. Directed toward relieving symptoms such as shortness of breath, decreased exercise tolerance, and extremity edema.    Expected Outcomes  Improve functional capacity of life;Short term: Attendance in program 2-3 days a week with increased exercise capacity. Reported lower sodium intake. Reported increased fruit and vegetable intake. Reports medication compliance.;Short term: Daily weights obtained and reported for increase. Utilizing diuretic protocols set by physician.;Long term: Adoption of self-care skills and reduction of barriers for early signs and symptoms recognition and intervention leading to self-care maintenance.    Hypertension  Yes takes medication     Intervention  Provide education on lifestyle modifcations including regular physical activity/exercise, weight management, moderate sodium restriction and increased consumption of fresh fruit, vegetables, and low fat dairy, alcohol moderation, and smoking cessation.;Monitor prescription use compliance.    Expected Outcomes  Short Term: Continued assessment and intervention until BP is < 140/9035mG in hypertensive participants. < 130/58m12m in  hypertensive participants with diabetes, heart failure or chronic kidney disease.;Long Term: Maintenance of blood pressure at goal levels.        Personal Goals Discharge: Goals and Risk Factor Review - 06/30/17 1205      Core Components/Risk Factors/Patient Goals Review   Personal Goals Review  Weight Management/Obesity;Heart Failure;Improve shortness of breath with ADL's    Review  MildPhillip Heal been able to tell a difference in her SOB since starting [facility]. She feels like she has the energy and the breathing ability  to do more around the house and outside the home. Her pressures have been running consistent (120s-130s) and she continues to check them at home. Her weight was up a couple pounds this week, but her doctor told her to take her afternoon dose when she needs it, like this week when her weight is up. She is managing her HF and is aware of her symptoms, as she weighs herself daiy and checks for swelling.     Expected Outcomes  Short: continue to work on SOB and her weight will go back to baseline, around 125 lb. Long: indpendently manage blood pressure and heart failure symptoms, continue to communicate effectively with doctor regarding symptoms.        Exercise Goals and Review: Exercise Goals    Row Name 04/18/17 1227             Exercise Goals   Increase Physical Activity  Yes       Intervention  Provide advice, education, support and counseling about physical activity/exercise needs.;Develop an individualized exercise prescription for aerobic and resistive training based on initial evaluation findings, risk stratification, comorbidities and participant's personal goals.       Expected Outcomes  Achievement of increased cardiorespiratory fitness and enhanced flexibility, muscular endurance and strength shown through measurements of functional capacity and personal statement of participant.       Increase Strength and Stamina  Yes       Intervention  Provide advice,  education, support and counseling about physical activity/exercise needs.;Develop an individualized exercise prescription for aerobic and resistive training based on initial evaluation findings, risk stratification, comorbidities and participant's personal goals.       Expected Outcomes  Achievement of increased cardiorespiratory fitness and enhanced flexibility, muscular endurance and strength shown through measurements of functional capacity and personal statement of participant.       Able to understand and use rate of perceived exertion (RPE) scale  Yes       Intervention  Provide education and explanation on how to use RPE scale       Expected Outcomes  Short Term: Able to use RPE daily in rehab to express subjective intensity level;Long Term:  Able to use RPE to guide intensity level when exercising independently       Able to understand and use Dyspnea scale  Yes       Intervention  Provide education and explanation on how to use Dyspnea scale       Expected Outcomes  Short Term: Able to use Dyspnea scale daily in rehab to express subjective sense of shortness of breath during exertion;Long Term: Able to use Dyspnea scale to guide intensity level when exercising independently       Knowledge and understanding of Target Heart Rate Range (THRR)  Yes       Intervention  Provide education and explanation of THRR including how the numbers were predicted and where they are located for reference       Expected Outcomes  Short Term: Able to state/look up THRR;Long Term: Able to use THRR to govern intensity when exercising independently;Short Term: Able to use daily as guideline for intensity in rehab       Able to check pulse independently  Yes       Intervention  Provide education and demonstration on how to check pulse in carotid and radial arteries.;Review the importance of being able to check your own pulse for safety during independent exercise       Expected  Outcomes  Short Term: Able to explain  why pulse checking is important during independent exercise;Long Term: Able to check pulse independently and accurately       Understanding of Exercise Prescription  Yes       Intervention  Provide education, explanation, and written materials on patient's individual exercise prescription       Expected Outcomes  Short Term: Able to explain program exercise prescription;Long Term: Able to explain home exercise prescription to exercise independently          Nutrition & Weight - Outcomes: Pre Biometrics - 04/18/17 1226      Pre Biometrics   Height  '5\' 4"'  (1.626 m)    Weight  127 lb 9.6 oz (57.9 kg)    Waist Circumference  29.75 inches    Hip Circumference  39 inches    Waist to Hip Ratio  0.76 %    BMI (Calculated)  21.89        Nutrition: Nutrition Therapy & Goals - 04/18/17 1121      Personal Nutrition Goals   Comments  She plans to meet with the dietician. She states she eats pretty well and watches what she eats.       Intervention Plan   Intervention  Nutrition handout(s) given to patient.;Prescribe, educate and counsel regarding individualized specific dietary modifications aiming towards targeted core components such as weight, hypertension, lipid management, diabetes, heart failure and other comorbidities.    Expected Outcomes  Short Term Goal: Understand basic principles of dietary content, such as calories, fat, sodium, cholesterol and nutrients.;Long Term Goal: Adherence to prescribed nutrition plan.       Nutrition Discharge: Nutrition Assessments - 07/10/17 1237      MEDFICTS Scores   Pre Score  44       Education Questionnaire Score: Knowledge Questionnaire Score - 07/10/17 1236      Knowledge Questionnaire Score   Pre Score  13/18    Post Score  15/18 reviewed with patient       Goals reviewed with patient; copy given to patient.

## 2017-07-17 NOTE — Progress Notes (Signed)
Discharge Progress Report  Patient Details  Name: Mary Howe MRN: [id number] Date of Birth: Sep 02, 1932 Referring Provider:     Pulmonary Rehab from 04/18/2017 in [facility] Cardiac [facility]  Referring Provider  Tullo       Number of Visits: 36/36  Reason for Discharge:  Patient reached a stable level of exercise. Patient independent in their exercise. Patient has met program and personal goals.  Smoking History:  Social History   Tobacco Use  Smoking Status Former Smoker  . Packs/day: 1.50  . Years: 29.00  . Pack years: 43.50  . Types: Cigarettes  . Last attempt to quit: 04/11/1988  . Years since quitting: 29.2  Smokeless Tobacco Never Used  Tobacco Comment   quit smoking in 1990    Diagnosis:  Centrilobular emphysema ([place])  ADL [facility]: Pulmonary Assessment Scores    Row Name 04/18/17 1122 05/29/17 1140 07/10/17 1235     ADL [facility]   ADL Phase  Entry  Mid  -   SOB Score total  _0 Rest  1  0  0   Walk  0  0  0   Stairs  _1 Bath  0  0  0   Dress  0  0  0   Shop  0  0  0     CAT Score   CAT Score  14  -  6     mMRC Score   mMRC Score  1  -  -      Initial Exercise Prescription: Initial Exercise Prescription - 04/18/17 1200      Date of Initial Exercise RX and Referring Provider   Date  04/18/17    Referring Provider  Tullo      Treadmill   MPH  1.8    Grade  0    Minutes  15    METs  2      NuStep   Level  2    SPM  80    Minutes  15    METs  2      REL-XR   Level  2    Speed  50    Minutes  15    METs  2      Prescription Details   Frequency (times per week)  3    Duration  Progress to 45 minutes of aerobic exercise without signs/symptoms of physical distress      Intensity   THRR 40-80% of Max Heartrate  102-126    Ratings of Perceived Exertion  11-13    Perceived Dyspnea  0-4      Resistance Training   Training Prescription  Yes    Weight  2 lb    Reps  10-15       Discharge Exercise Prescription  (Final Exercise Prescription Changes): Exercise Prescription Changes - 07/05/17 1500      Response to Exercise   Blood Pressure (Admit)  114/74    Blood Pressure (Exit)  116/62    Heart Rate (Admit)  72 bpm    Heart Rate (Exercise)  98 bpm    Heart Rate (Exit)  67 bpm    Oxygen Saturation (Admit)  91 %    Oxygen Saturation (Exercise)  93 %    Oxygen Saturation (Exit)  94 %    Rating of Perceived Exertion (Exercise)  13    Perceived Dyspnea (Exercise)  1  Symptoms  none    Duration  Continue with 45 min of aerobic exercise without signs/symptoms of physical distress.    Intensity  THRR unchanged      Progression   Progression  Continue to progress workloads to maintain intensity without signs/symptoms of physical distress.    Average METs  4.63      Resistance Training   Training Prescription  Yes    Weight  4 lbs    Reps  10-15      Interval Training   Interval Training  No      Treadmill   MPH  2.7    Grade  2.5    Minutes  15    METs  4      NuStep   Level  4    SPM  91    Minutes  15    METs  3.6      REL-XR   Level  3    Minutes  15    METs  6.3      Home Exercise Plan   Plans to continue exercise at  Home (comment) walking    Frequency  Add 2 additional days to program exercise sessions.    Initial Home Exercises Provided  05/10/17       Functional Capacity: 6 Minute Walk    Row Name 04/18/17 1227 07/12/17 1156       6 Minute Walk   Phase  -  Discharge    Distance  1040 feet  1430 feet    Distance % Change  -  37.5 %    Distance Feet Change  -  390 ft    Walk Time  6 minutes  6 minutes    # of Rest Breaks  0  0    MPH  1.97  2.71    METS  1.98  2.78    RPE  10  13    Perceived Dyspnea   1  1    VO2 Peak  6.94  9.74    Symptoms  No  No    Resting HR  78 bpm  73 bpm    Resting BP  134/64  118/62    Resting Oxygen Saturation   95 %  95 %    Exercise Oxygen Saturation  during 6 min walk  89 %  84 %    Max Ex. HR  96 bpm  109 bpm    Max  Ex. BP  130/60  134/62    2 Minute Post BP  120/60  -      Interval HR   1 Minute HR  82  88    2 Minute HR  79  90    3 Minute HR  -  108    4 Minute HR  86  109    5 Minute HR  92  109    6 Minute HR  96  108    2 Minute Post HR  74  -    Interval Heart Rate?  Yes  Yes      Interval Oxygen   Interval Oxygen?  Yes  Yes    Baseline Oxygen Saturation %  95 %  95 %    1 Minute Oxygen Saturation %  94 %  88 %    1 Minute Liters of Oxygen  0 L  0 L Room Air    2 Minute Oxygen Saturation %  89 %  89 %  2 Minute Liters of Oxygen  0 L  0 L    3 Minute Oxygen Saturation %  -  84 %    3 Minute Liters of Oxygen  0 L  0 L    4 Minute Oxygen Saturation %  94 %  91 %    4 Minute Liters of Oxygen  0 L  0 L    5 Minute Oxygen Saturation %  93 %  90 %    5 Minute Liters of Oxygen  0 L  0 L    6 Minute Oxygen Saturation %  91 %  90 %    6 Minute Liters of Oxygen  0 L  0 L    2 Minute Post Oxygen Saturation %  95 %  -    2 Minute Post Liters of Oxygen  0 L  -       Psychological, QOL, Others - Outcomes: PHQ 2/9: Depression screen [facility] 2/9 07/10/2017 05/29/2017 04/18/2017 09/20/2016 06/22/2016  Decreased Interest 0 0 0 0 0  Down, Depressed, Hopeless 0 0 0 0 0  PHQ - 2 Score 0 0 0 0 0  Altered sleeping 0 0 0 - -  Tired, decreased energy 0 0 0 - -  Change in appetite 0 0 0 - -  Feeling bad or failure about yourself  0 0 0 - -  Trouble concentrating 0 0 0 - -  Moving slowly or fidgety/restless 0 0 0 - -  Suicidal thoughts 0 0 0 - -  PHQ-9 Score 0 0 0 - -  Difficult doing work/chores Not difficult at all Not difficult at all Not difficult at all - -    Quality of Life:   Personal Goals: Goals established at orientation with interventions provided to work toward goal. Personal Goals and Risk Factors at Admission - 04/18/17 1136      Core Components/Risk Factors/Patient Goals on Admission    Weight Management  Yes;Weight Maintenance    Intervention  Weight Management: Develop a combined  nutrition and exercise program designed to reach desired caloric intake, while maintaining appropriate intake of nutrient and fiber, sodium and fats, and appropriate energy expenditure required for the weight goal.;Weight Management: Provide education and appropriate resources to help participant work on and attain dietary goals.;Weight Management/Obesity: Establish reasonable short term and long term weight goals.    Admit Weight  127 lb 9.6 oz (57.9 kg)    Goal Weight: Short Term  127 lb (57.6 kg)    Goal Weight: Long Term  127 lb (57.6 kg)    Expected Outcomes  Short Term: Continue to assess and modify interventions until short term weight is achieved;Long Term: Adherence to nutrition and physical activity/exercise program aimed toward attainment of established weight goal;Weight Maintenance: Understanding of the daily nutrition guidelines, which includes 25-35% calories from fat, 7% or less cal from saturated fats, less than 2107m cholesterol, less than 1.5gm of sodium, & 5 or more servings of fruits and vegetables daily;Understanding recommendations for meals to include 15-35% energy as protein, 25-35% energy from fat, 35-60% energy from carbohydrates, less than 2082mof dietary cholesterol, 20-35 gm of total fiber daily;Understanding of distribution of calorie intake throughout the day with the consumption of 4-5 meals/snacks    Improve shortness of breath with ADL's  Yes    Intervention  Provide education, individualized exercise plan and daily activity instruction to help decrease symptoms of SOB with activities of daily living.    Expected Outcomes  Short Term: Achieves a reduction of symptoms when performing activities of daily living.    Heart Failure  Yes    Intervention  Provide a combined exercise and nutrition program that is supplemented with education, support and counseling about heart failure. Directed toward relieving symptoms such as shortness of breath, decreased exercise tolerance,  and extremity edema.    Expected Outcomes  Improve functional capacity of life;Short term: Attendance in program 2-3 days a week with increased exercise capacity. Reported lower sodium intake. Reported increased fruit and vegetable intake. Reports medication compliance.;Short term: Daily weights obtained and reported for increase. Utilizing diuretic protocols set by physician.;Long term: Adoption of self-care skills and reduction of barriers for early signs and symptoms recognition and intervention leading to self-care maintenance.    Hypertension  Yes takes medication     Intervention  Provide education on lifestyle modifcations including regular physical activity/exercise, weight management, moderate sodium restriction and increased consumption of fresh fruit, vegetables, and low fat dairy, alcohol moderation, and smoking cessation.;Monitor prescription use compliance.    Expected Outcomes  Short Term: Continued assessment and intervention until BP is < 140/15m HG in hypertensive participants. < 130/872mHG in hypertensive participants with diabetes, heart failure or chronic kidney disease.;Long Term: Maintenance of blood pressure at goal levels.        Personal Goals Discharge: Goals and Risk Factor Review    Row Name 05/17/17 1222 06/02/17 1149 06/30/17 1205         Core Components/Risk Factors/Patient Goals Review   Personal Goals Review  Weight Management/Obesity;Improve shortness of breath with ADL's;Heart Failure;Hypertension;Stress  Weight Management/Obesity;Improve shortness of breath with ADL's;Heart Failure;Hypertension  Weight Management/Obesity;Heart Failure;Improve shortness of breath with ADL's     Review  MiLeeannatates that her doctor wants her blood pressure to be below 14932or systolic. Her weight has been good and she is interested in maintaining it. 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 She does not notice herself getting short of breath  at home as much.  MiAlvinas been doing well in rehab.  SOB at home is getting better and she is able to do more at home.   Her blood pressures have been  good and she is checking them at home.   Usually gets them in the 120-130s.  Her weight has been stable as well.  She has not had any heart failure sysmptoms.   MiPhillip Healas been able to tell a difference in her SOB since starting [facility]. She feels like she has the energy and the breathing ability to do more around the house and outside the home. Her pressures have been running consistent (120s-130s) and she continues to check them at home. Her weight was up a couple pounds this week, but her doctor told her to take her afternoon dose when she needs it, like this week when her weight is up. She is managing her HF and is aware of her symptoms, as she weighs herself daiy and checks for swelling.      Expected Outcomes  Short: attend [facility] to improve ADL's.  Long: maintain exercise to reduce stress and Improve ADL.  Short: Continue to work on improving SOB for ADLs.  Long: Continue to monitor blood pressures regularly.   Short: continue to work on SOB and her weight will go back to baseline, around 125 lb. Long: indpendently manage blood pressure and heart failure symptoms, continue to communicate effectively with doctor regarding symptoms.  Exercise Goals and Review: Exercise Goals    Row Name 04/18/17 1227             Exercise Goals   Increase Physical Activity  Yes       Intervention  Provide advice, education, support and counseling about physical activity/exercise needs.;Develop an individualized exercise prescription for aerobic and resistive training based on initial evaluation findings, risk stratification, comorbidities and participant's personal goals.       Expected Outcomes  Achievement of increased cardiorespiratory fitness and enhanced flexibility, muscular endurance and strength shown through measurements of functional  capacity and personal statement of participant.       Increase Strength and Stamina  Yes       Intervention  Provide advice, education, support and counseling about physical activity/exercise needs.;Develop an individualized exercise prescription for aerobic and resistive training based on initial evaluation findings, risk stratification, comorbidities and participant's personal goals.       Expected Outcomes  Achievement of increased cardiorespiratory fitness and enhanced flexibility, muscular endurance and strength shown through measurements of functional capacity and personal statement of participant.       Able to understand and use rate of perceived exertion (RPE) scale  Yes       Intervention  Provide education and explanation on how to use RPE scale       Expected Outcomes  Short Term: Able to use RPE daily in rehab to express subjective intensity level;Long Term:  Able to use RPE to guide intensity level when exercising independently       Able to understand and use Dyspnea scale  Yes       Intervention  Provide education and explanation on how to use Dyspnea scale       Expected Outcomes  Short Term: Able to use Dyspnea scale daily in rehab to express subjective sense of shortness of breath during exertion;Long Term: Able to use Dyspnea scale to guide intensity level when exercising independently       Knowledge and understanding of Target Heart Rate Range (THRR)  Yes       Intervention  Provide education and explanation of THRR including how the numbers were predicted and where they are located for reference       Expected Outcomes  Short Term: Able to state/look up THRR;Long Term: Able to use THRR to govern intensity when exercising independently;Short Term: Able to use daily as guideline for intensity in rehab       Able to check pulse independently  Yes       Intervention  Provide education and demonstration on how to check pulse in carotid and radial arteries.;Review the importance of  being able to check your own pulse for safety during independent exercise       Expected Outcomes  Short Term: Able to explain why pulse checking is important during independent exercise;Long Term: Able to check pulse independently and accurately       Understanding of Exercise Prescription  Yes       Intervention  Provide education, explanation, and written materials on patient's individual exercise prescription       Expected Outcomes  Short Term: Able to explain program exercise prescription;Long Term: Able to explain home exercise prescription to exercise independently          Nutrition & Weight - Outcomes: Pre Biometrics - 04/18/17 1226      Pre Biometrics   Height  5' 4" (1.626 m)    Weight  127 lb 9.6  oz (57.9 kg)    Waist Circumference  29.75 inches    Hip Circumference  39 inches    Waist to Hip Ratio  0.76 %    BMI (Calculated)  21.89        Nutrition: Nutrition Therapy & Goals - 04/18/17 1121      Personal Nutrition Goals   Comments  She plans to meet with the dietician. She states she eats pretty well and watches what she eats.       Intervention Plan   Intervention  Nutrition handout(s) given to patient.;Prescribe, educate and counsel regarding individualized specific dietary modifications aiming towards targeted core components such as weight, hypertension, lipid management, diabetes, heart failure and other comorbidities.    Expected Outcomes  Short Term Goal: Understand basic principles of dietary content, such as calories, fat, sodium, cholesterol and nutrients.;Long Term Goal: Adherence to prescribed nutrition plan.       Nutrition Discharge: Nutrition Assessments - 07/10/17 1237      MEDFICTS Scores   Pre Score  44       Education Questionnaire Score: Knowledge Questionnaire Score - 07/10/17 1236      Knowledge Questionnaire Score   Pre Score  13/18    Post Score  15/18 reviewed with patient       Goals reviewed with patient; copy given to  patient.

## 2017-07-23 ENCOUNTER — Other Ambulatory Visit: Payer: Self-pay | Admitting: Internal Medicine

## 2017-08-09 DIAGNOSIS — H2513 Age-related nuclear cataract, bilateral: Secondary | ICD-10-CM | POA: Diagnosis not present

## 2017-09-21 ENCOUNTER — Ambulatory Visit: Payer: Self-pay

## 2017-09-27 DIAGNOSIS — D2271 Melanocytic nevi of right lower limb, including hip: Secondary | ICD-10-CM | POA: Diagnosis not present

## 2017-09-27 DIAGNOSIS — L570 Actinic keratosis: Secondary | ICD-10-CM | POA: Diagnosis not present

## 2017-09-27 DIAGNOSIS — L821 Other seborrheic keratosis: Secondary | ICD-10-CM | POA: Diagnosis not present

## 2017-09-27 DIAGNOSIS — D2262 Melanocytic nevi of left upper limb, including shoulder: Secondary | ICD-10-CM | POA: Diagnosis not present

## 2017-09-27 DIAGNOSIS — D225 Melanocytic nevi of trunk: Secondary | ICD-10-CM | POA: Diagnosis not present

## 2017-09-27 DIAGNOSIS — X32XXXA Exposure to sunlight, initial encounter: Secondary | ICD-10-CM | POA: Diagnosis not present

## 2017-09-27 DIAGNOSIS — D2272 Melanocytic nevi of left lower limb, including hip: Secondary | ICD-10-CM | POA: Diagnosis not present

## 2017-09-27 DIAGNOSIS — I872 Venous insufficiency (chronic) (peripheral): Secondary | ICD-10-CM | POA: Diagnosis not present

## 2017-09-27 DIAGNOSIS — D2261 Melanocytic nevi of right upper limb, including shoulder: Secondary | ICD-10-CM | POA: Diagnosis not present

## 2017-10-03 DIAGNOSIS — H2511 Age-related nuclear cataract, right eye: Secondary | ICD-10-CM | POA: Diagnosis not present

## 2017-10-04 ENCOUNTER — Encounter: Payer: Self-pay | Admitting: *Deleted

## 2017-10-04 ENCOUNTER — Other Ambulatory Visit: Payer: Self-pay

## 2017-10-06 NOTE — Discharge Instructions (Signed)

## 2017-10-10 ENCOUNTER — Ambulatory Visit: Payer: Medicare Other | Admitting: Anesthesiology

## 2017-10-10 ENCOUNTER — Encounter
Admission: RE | Disposition: A | Discharge status: Home / Self Care | Payer: Self-pay | Source: Ambulatory Visit | Attending: Ophthalmology

## 2017-10-10 ENCOUNTER — Ambulatory Visit
Admission: RE | Admit: 2017-10-10 | Discharge: 2017-10-10 | Disposition: A | Discharge status: Home / Self Care | Payer: Medicare Other | Source: Ambulatory Visit | Attending: Ophthalmology | Admitting: Ophthalmology

## 2017-10-10 DIAGNOSIS — I110 Hypertensive heart disease with heart failure: Secondary | ICD-10-CM | POA: Insufficient documentation

## 2017-10-10 DIAGNOSIS — M810 Age-related osteoporosis without current pathological fracture: Secondary | ICD-10-CM | POA: Insufficient documentation

## 2017-10-10 DIAGNOSIS — Z79899 Other long term (current) drug therapy: Secondary | ICD-10-CM | POA: Insufficient documentation

## 2017-10-10 DIAGNOSIS — Z87891 Personal history of nicotine dependence: Secondary | ICD-10-CM | POA: Diagnosis not present

## 2017-10-10 DIAGNOSIS — G4730 Sleep apnea, unspecified: Secondary | ICD-10-CM | POA: Insufficient documentation

## 2017-10-10 DIAGNOSIS — Z9981 Dependence on supplemental oxygen: Secondary | ICD-10-CM | POA: Diagnosis not present

## 2017-10-10 DIAGNOSIS — E7800 Pure hypercholesterolemia, unspecified: Secondary | ICD-10-CM | POA: Insufficient documentation

## 2017-10-10 DIAGNOSIS — Z8719 Personal history of other diseases of the digestive system: Secondary | ICD-10-CM | POA: Insufficient documentation

## 2017-10-10 DIAGNOSIS — Z888 Allergy status to other drugs, medicaments and biological substances status: Secondary | ICD-10-CM | POA: Diagnosis not present

## 2017-10-10 DIAGNOSIS — J439 Emphysema, unspecified: Secondary | ICD-10-CM | POA: Insufficient documentation

## 2017-10-10 DIAGNOSIS — E1136 Type 2 diabetes mellitus with diabetic cataract: Secondary | ICD-10-CM | POA: Insufficient documentation

## 2017-10-10 DIAGNOSIS — I509 Heart failure, unspecified: Secondary | ICD-10-CM | POA: Insufficient documentation

## 2017-10-10 DIAGNOSIS — K219 Gastro-esophageal reflux disease without esophagitis: Secondary | ICD-10-CM | POA: Insufficient documentation

## 2017-10-10 DIAGNOSIS — H2511 Age-related nuclear cataract, right eye: Secondary | ICD-10-CM | POA: Insufficient documentation

## 2017-10-10 DIAGNOSIS — M1990 Unspecified osteoarthritis, unspecified site: Secondary | ICD-10-CM | POA: Diagnosis not present

## 2017-10-10 DIAGNOSIS — H25811 Combined forms of age-related cataract, right eye: Secondary | ICD-10-CM | POA: Diagnosis not present

## 2017-10-10 HISTORY — DX: Heart failure, unspecified: I50.9

## 2017-10-10 HISTORY — PX: CATARACT EXTRACTION W/PHACO: SHX586

## 2017-10-10 HISTORY — DX: Presence of external hearing-aid: Z97.4

## 2017-10-10 HISTORY — DX: Presence of dental prosthetic device (complete) (partial): Z97.2

## 2017-10-10 SURGERY — PHACOEMULSIFICATION, CATARACT, WITH IOL INSERTION
Anesthesia: Monitor Anesthesia Care | Site: Eye | Laterality: Right | Wound class: "Clean "

## 2017-10-10 MED ORDER — FENTANYL CITRATE (PF) 100 MCG/2ML IJ SOLN
INTRAMUSCULAR | Status: DC | PRN
  Administered 2017-10-10: 50 ug via INTRAVENOUS

## 2017-10-10 MED ORDER — MIDAZOLAM HCL 2 MG/2ML IJ SOLN
INTRAMUSCULAR | Status: DC | PRN
  Administered 2017-10-10: 1 mg via INTRAVENOUS

## 2017-10-10 MED ORDER — CEFUROXIME OPHTHALMIC INJECTION 1 MG/0.1 ML
INJECTION | OPHTHALMIC | Status: DC | PRN
  Administered 2017-10-10: 0.1 mL via INTRACAMERAL

## 2017-10-10 MED ORDER — ARMC OPHTHALMIC DILATING DROPS
1.0000 "application " | OPHTHALMIC | Status: DC | PRN
  Administered 2017-10-10 (×3): 1 via OPHTHALMIC

## 2017-10-10 MED ORDER — LACTATED RINGERS IV SOLN: INTRAVENOUS | Status: DC

## 2017-10-10 MED ORDER — LIDOCAINE HCL (PF) 2 % IJ SOLN
INTRAOCULAR | Status: DC | PRN
  Administered 2017-10-10: 1 mL

## 2017-10-10 MED ORDER — BRIMONIDINE TARTRATE-TIMOLOL 0.2-0.5 % OP SOLN
OPHTHALMIC | Status: DC | PRN
  Administered 2017-10-10: 1 [drp] via OPHTHALMIC

## 2017-10-10 MED ORDER — EPINEPHRINE PF 1 MG/ML IJ SOLN
INTRAOCULAR | Status: DC | PRN
  Administered 2017-10-10: 46 mL via OPHTHALMIC

## 2017-10-10 MED ORDER — MOXIFLOXACIN HCL 0.5 % OP SOLN
1.0000 [drp] | OPHTHALMIC | Status: DC | PRN
  Administered 2017-10-10 (×3): 1 [drp] via OPHTHALMIC

## 2017-10-10 MED ORDER — NA HYALUR & NA CHOND-NA HYALUR 0.4-0.35 ML IO KIT
PACK | INTRAOCULAR | Status: DC | PRN
  Administered 2017-10-10: 1 mL via INTRAOCULAR

## 2017-10-10 SURGICAL SUPPLY — 20 items
CANNULA ANT/CHMB 27G (MISCELLANEOUS) ×1 IMPLANT
CANNULA ANT/CHMB 27GA (MISCELLANEOUS) ×3 IMPLANT
GLOVE SURG LX 7.5 STRW (GLOVE) ×2
GLOVE SURG LX STRL 7.5 STRW (GLOVE) ×1 IMPLANT
GLOVE SURG TRIUMPH 8.0 PF LTX (GLOVE) ×3 IMPLANT
GOWN STRL REUS W/ TWL LRG LVL3 (GOWN DISPOSABLE) ×2 IMPLANT
GOWN STRL REUS W/TWL LRG LVL3 (GOWN DISPOSABLE) ×4
LENS IOL TECNIS ITEC 20.0 (Intraocular Lens) ×2 IMPLANT
MARKER SKIN DUAL TIP RULER LAB (MISCELLANEOUS) ×3 IMPLANT
NDL FILTER BLUNT 18X1 1/2 (NEEDLE) ×1 IMPLANT
NEEDLE FILTER BLUNT 18X 1/2SAF (NEEDLE) ×2
NEEDLE FILTER BLUNT 18X1 1/2 (NEEDLE) ×1 IMPLANT
PACK CATARACT BRASINGTON (MISCELLANEOUS) ×3 IMPLANT
PACK EYE AFTER SURG (MISCELLANEOUS) ×3 IMPLANT
PACK OPTHALMIC (MISCELLANEOUS) ×3 IMPLANT
SYR 3ML LL SCALE MARK (SYRINGE) ×3 IMPLANT
SYR 5ML LL (SYRINGE) ×3 IMPLANT
SYR TB 1ML LUER SLIP (SYRINGE) ×3 IMPLANT
WATER STERILE IRR 500ML POUR (IV SOLUTION) ×3 IMPLANT
WIPE NON LINTING 3.25X3.25 (MISCELLANEOUS) ×3 IMPLANT

## 2017-10-10 NOTE — H&P (Signed)
The History and Physical notes are on paper, have been signed, and are to be scanned. The patient remains stable and unchanged from the H&P.   Previous H&P reviewed, patient examined, and there are no changes.  Teyla Skidgel 10/10/2017 10:21 AM

## 2017-10-10 NOTE — Anesthesia Preprocedure Evaluation (Signed)
Anesthesia Evaluation  Patient identified by MRN, date of birth, ID band Patient awake    Reviewed: Allergy & Precautions, H&P , NPO status , Patient's Chart, lab work & pertinent test results  Airway Mallampati: II  TM Distance: >3 FB Neck ROM: full    Dental no notable dental hx.    Pulmonary COPD, former smoker,    Pulmonary exam normal        Cardiovascular hypertension, +CHF  Normal cardiovascular exam     Neuro/Psych PSYCHIATRIC DISORDERS    GI/Hepatic Neg liver ROS, Medicated,  Endo/Other  diabetes, Well Controlled  Renal/GU      Musculoskeletal   Abdominal   Peds  Hematology negative hematology ROS (+)   Anesthesia Other Findings   Reproductive/Obstetrics negative OB ROS                             Anesthesia Physical Anesthesia Plan  ASA: III  Anesthesia Plan: MAC   Post-op Pain Management:    Induction:   PONV Risk Score and Plan:   Airway Management Planned:   Additional Equipment:   Intra-op Plan:   Post-operative Plan:   Informed Consent: I have reviewed the patients History and Physical, chart, labs and discussed the procedure including the risks, benefits and alternatives for the proposed anesthesia with the patient or authorized representative who has indicated his/her understanding and acceptance.     Plan Discussed with:   Anesthesia Plan Comments:         Anesthesia Quick Evaluation

## 2017-10-10 NOTE — Anesthesia Postprocedure Evaluation (Signed)
Anesthesia Post Note  Patient: Mary Howe  Procedure(s) Performed: CATARACT EXTRACTION PHACO AND INTRAOCULAR LENS PLACEMENT (IOC)  RIGHT (Right Eye)  Patient location during evaluation: PACU Anesthesia Type: MAC Level of consciousness: awake and alert Pain management: pain level controlled Vital Signs Assessment: post-procedure vital signs reviewed and stable Respiratory status: spontaneous breathing Cardiovascular status: blood pressure returned to baseline Postop Assessment: no headache Anesthetic complications: no    Jaci Standard, III,  Divya Munshi D

## 2017-10-10 NOTE — Op Note (Signed)
LOCATION:  [place]   PREOPERATIVE DIAGNOSIS:    Nuclear sclerotic cataract right eye. H25.11   POSTOPERATIVE DIAGNOSIS:  Nuclear sclerotic cataract right eye.     PROCEDURE:  Phacoemusification with posterior chamber intraocular lens placement of the right eye   LENS:   Implant Name Type Inv. Item Serial No. Manufacturer Lot No. LRB No. Used  LENS IOL DIOP 20.0 - [id number] Intraocular Lens LENS IOL DIOP 20.0 [id number]  Right 1        ULTRASOUND TIME: 20 % of 0 minutes, 53 seconds.  CDE 10.6   SURGEON:  Wyonia Hough, MD   ANESTHESIA:  Topical with tetracaine drops and 2% Xylocaine jelly, augmented with 1% preservative-free intracameral lidocaine.    COMPLICATIONS:  None.   DESCRIPTION OF PROCEDURE:  The patient was identified in the holding room and transported to the operating room and placed in the supine position under the operating microscope.  The right eye was identified as the operative eye and it was prepped and draped in the usual sterile ophthalmic fashion.   A 1 millimeter clear-corneal paracentesis was made at the 12:00 position.  0.5 ml of preservative-free 1% lidocaine was injected into the anterior chamber. The anterior chamber was filled with Viscoat viscoelastic.  A 2.4 millimeter keratome was used to make a near-clear corneal incision at the 9:00 position.  A curvilinear capsulorrhexis was made with a cystotome and capsulorrhexis forceps.  Balanced salt solution was used to hydrodissect and hydrodelineate the nucleus.   Phacoemulsification was then used in stop and chop fashion to remove the lens nucleus and epinucleus.  The remaining cortex was then removed using the irrigation and aspiration handpiece. Provisc was then placed into the capsular bag to distend it for lens placement.  A lens was then injected into the capsular bag.  The remaining viscoelastic was aspirated.   Wounds were hydrated with balanced salt solution.  The anterior  chamber was inflated to a physiologic pressure with balanced salt solution.  No wound leaks were noted. Cefuroxime 0.1 ml of a 10mg /ml solution was injected into the anterior chamber for a dose of 1 mg of intracameral antibiotic at the completion of the case.   Timolol and Brimonidine drops were applied to the eye.  The patient was taken to the recovery room in stable condition without complications of anesthesia or surgery.   Mary Howe 10/10/2017, 11:00 AM

## 2017-10-10 NOTE — Transfer of Care (Signed)
Immediate Anesthesia Transfer of Care Note  Patient: Mary Howe  Procedure(s) Performed: CATARACT EXTRACTION PHACO AND INTRAOCULAR LENS PLACEMENT (IOC)  RIGHT (Right Eye)  Patient Location: PACU  Anesthesia Type: MAC  Level of Consciousness: awake, alert  and patient cooperative  Airway and Oxygen Therapy: Patient Spontanous Breathing and Patient connected to supplemental oxygen  Post-op Assessment: Post-op Vital signs reviewed, Patient's Cardiovascular Status Stable, Respiratory Function Stable, Patent Airway and No signs of Nausea or vomiting  Post-op Vital Signs: Reviewed and stable  Complications: No apparent anesthesia complications

## 2017-10-10 NOTE — Anesthesia Procedure Notes (Signed)
Procedure Name: MAC Date/Time: 10/10/2017 10:48 AM Performed by: Lind Guest, CRNA Pre-anesthesia Checklist: Patient identified, Emergency Drugs available, Suction available, Patient being monitored and Timeout performed Patient Re-evaluated:Patient Re-evaluated prior to induction Oxygen Delivery Method: Nasal cannula

## 2017-10-11 ENCOUNTER — Encounter: Payer: Self-pay | Admitting: Ophthalmology

## 2017-11-03 DIAGNOSIS — H2512 Age-related nuclear cataract, left eye: Secondary | ICD-10-CM | POA: Diagnosis not present

## 2017-11-07 ENCOUNTER — Other Ambulatory Visit: Payer: Self-pay

## 2017-11-07 ENCOUNTER — Encounter: Payer: Self-pay | Admitting: *Deleted

## 2017-11-07 NOTE — Anesthesia Preprocedure Evaluation (Addendum)
Anesthesia Evaluation  Patient identified by MRN, date of birth, ID band Patient awake    History of Anesthesia Complications Negative for: history of anesthetic complications  Airway Mallampati: II       Dental   Pulmonary COPD, former smoker,    Pulmonary exam normal        Cardiovascular hypertension, Normal cardiovascular exam     Neuro/Psych    GI/Hepatic GERD  ,  Endo/Other  diabetes  Renal/GU      Musculoskeletal   Abdominal   Peds  Hematology   Anesthesia Other Findings   Reproductive/Obstetrics                                                             Anesthesia Evaluation  Patient identified by MRN, date of birth, ID band Patient awake    Reviewed: Allergy & Precautions, H&P , NPO status , Patient's Chart, lab work & pertinent test results  Airway Mallampati: II  TM Distance: >3 FB Neck ROM: full    Dental no notable dental hx.    Pulmonary COPD, former smoker,    Pulmonary exam normal        Cardiovascular hypertension, +CHF  Normal cardiovascular exam     Neuro/Psych PSYCHIATRIC DISORDERS    GI/Hepatic Neg liver ROS, Medicated,  Endo/Other  diabetes, Well Controlled  Renal/GU      Musculoskeletal   Abdominal   Peds  Hematology negative hematology ROS (+)   Anesthesia Other Findings   Reproductive/Obstetrics negative OB ROS                             Anesthesia Physical Anesthesia Plan  ASA: III  Anesthesia Plan: MAC   Post-op Pain Management:    Induction:   PONV Risk Score and Plan:   Airway Management Planned:   Additional Equipment:   Intra-op Plan:   Post-operative Plan:   Informed Consent: I have reviewed the patients History and Physical, chart, labs and discussed the procedure including the risks, benefits and alternatives for the proposed anesthesia with the patient or authorized  representative who has indicated his/her understanding and acceptance.     Plan Discussed with:   Anesthesia Plan Comments:         Anesthesia Quick Evaluation  Anesthesia Physical Anesthesia Plan  ASA: II  Anesthesia Plan: MAC   Post-op Pain Management:    Induction:   PONV Risk Score and Plan:   Airway Management Planned:   Additional Equipment:   Intra-op Plan:   Post-operative Plan:   Informed Consent: I have reviewed the patients History and Physical, chart, labs and discussed the procedure including the risks, benefits and alternatives for the proposed anesthesia with the patient or authorized representative who has indicated his/her understanding and acceptance.     Plan Discussed with: CRNA  Anesthesia Plan Comments:         Anesthesia Quick Evaluation                                  Anesthesia Evaluation  Patient identified by MRN, date of birth, ID band Patient awake    Reviewed: Allergy & Precautions, H&P , NPO status ,  Patient's Chart, lab work & pertinent test results  Airway Mallampati: II  TM Distance: >3 FB Neck ROM: full    Dental no notable dental hx.    Pulmonary COPD, former smoker,    Pulmonary exam normal        Cardiovascular hypertension, +CHF  Normal cardiovascular exam     Neuro/Psych PSYCHIATRIC DISORDERS    GI/Hepatic Neg liver ROS, Medicated,  Endo/Other  diabetes, Well Controlled  Renal/GU      Musculoskeletal   Abdominal   Peds  Hematology negative hematology ROS (+)   Anesthesia Other Findings   Reproductive/Obstetrics negative OB ROS                             Anesthesia Physical Anesthesia Plan  ASA: III  Anesthesia Plan: MAC   Post-op Pain Management:    Induction:   PONV Risk Score and Plan:   Airway Management Planned:   Additional Equipment:   Intra-op Plan:   Post-operative Plan:   Informed Consent: I have reviewed the  patients History and Physical, chart, labs and discussed the procedure including the risks, benefits and alternatives for the proposed anesthesia with the patient or authorized representative who has indicated his/her understanding and acceptance.     Plan Discussed with:   Anesthesia Plan Comments:         Anesthesia Quick Evaluation

## 2017-11-08 NOTE — Discharge Instructions (Signed)

## 2017-11-15 ENCOUNTER — Ambulatory Visit
Admission: RE | Admit: 2017-11-15 | Discharge: 2017-11-15 | Disposition: A | Discharge status: Home / Self Care | Payer: Medicare Other | Source: Ambulatory Visit | Attending: Ophthalmology | Admitting: Ophthalmology

## 2017-11-15 ENCOUNTER — Ambulatory Visit: Payer: Medicare Other | Admitting: Anesthesiology

## 2017-11-15 ENCOUNTER — Encounter
Admission: RE | Disposition: A | Discharge status: Home / Self Care | Payer: Self-pay | Source: Ambulatory Visit | Attending: Ophthalmology

## 2017-11-15 DIAGNOSIS — Z9981 Dependence on supplemental oxygen: Secondary | ICD-10-CM | POA: Diagnosis not present

## 2017-11-15 DIAGNOSIS — G4730 Sleep apnea, unspecified: Secondary | ICD-10-CM | POA: Insufficient documentation

## 2017-11-15 DIAGNOSIS — M810 Age-related osteoporosis without current pathological fracture: Secondary | ICD-10-CM | POA: Diagnosis not present

## 2017-11-15 DIAGNOSIS — H2512 Age-related nuclear cataract, left eye: Secondary | ICD-10-CM | POA: Diagnosis not present

## 2017-11-15 DIAGNOSIS — J439 Emphysema, unspecified: Secondary | ICD-10-CM | POA: Diagnosis not present

## 2017-11-15 DIAGNOSIS — Z87891 Personal history of nicotine dependence: Secondary | ICD-10-CM | POA: Insufficient documentation

## 2017-11-15 DIAGNOSIS — E119 Type 2 diabetes mellitus without complications: Secondary | ICD-10-CM | POA: Diagnosis not present

## 2017-11-15 DIAGNOSIS — H25812 Combined forms of age-related cataract, left eye: Secondary | ICD-10-CM | POA: Diagnosis not present

## 2017-11-15 DIAGNOSIS — I110 Hypertensive heart disease with heart failure: Secondary | ICD-10-CM | POA: Insufficient documentation

## 2017-11-15 DIAGNOSIS — I509 Heart failure, unspecified: Secondary | ICD-10-CM | POA: Insufficient documentation

## 2017-11-15 HISTORY — PX: CATARACT EXTRACTION W/PHACO: SHX586

## 2017-11-15 SURGERY — PHACOEMULSIFICATION, CATARACT, WITH IOL INSERTION
Anesthesia: Monitor Anesthesia Care | Site: Eye | Laterality: Left | Wound class: "Clean "

## 2017-11-15 MED ORDER — PROMETHAZINE HCL 25 MG/ML IJ SOLN: 6.2500 mg | INTRAMUSCULAR | Status: DC | PRN

## 2017-11-15 MED ORDER — CYCLOPENTOLATE HCL 2 % OP SOLN
1.0000 [drp] | OPHTHALMIC | Status: DC | PRN
  Administered 2017-11-15 (×3): 1 [drp] via OPHTHALMIC

## 2017-11-15 MED ORDER — FENTANYL CITRATE (PF) 100 MCG/2ML IJ SOLN: 25.0000 ug | INTRAMUSCULAR | Status: DC | PRN

## 2017-11-15 MED ORDER — CEFUROXIME OPHTHALMIC INJECTION 1 MG/0.1 ML
INJECTION | OPHTHALMIC | Status: DC | PRN
  Administered 2017-11-15: 0.1 mL via INTRACAMERAL

## 2017-11-15 MED ORDER — MEPERIDINE HCL 25 MG/ML IJ SOLN: 6.2500 mg | INTRAMUSCULAR | Status: DC | PRN

## 2017-11-15 MED ORDER — LIDOCAINE HCL (PF) 2 % IJ SOLN
INTRAOCULAR | Status: DC | PRN
  Administered 2017-11-15: 1 mL

## 2017-11-15 MED ORDER — PHENYLEPHRINE HCL 10 % OP SOLN
1.0000 [drp] | OPHTHALMIC | Status: DC | PRN
  Administered 2017-11-15 (×3): 1 [drp] via OPHTHALMIC

## 2017-11-15 MED ORDER — MIDAZOLAM HCL 2 MG/2ML IJ SOLN
INTRAMUSCULAR | Status: DC | PRN
  Administered 2017-11-15: 1 mg via INTRAVENOUS

## 2017-11-15 MED ORDER — MOXIFLOXACIN HCL 0.5 % OP SOLN
1.0000 [drp] | OPHTHALMIC | Status: DC | PRN
  Administered 2017-11-15 (×3): 1 [drp] via OPHTHALMIC

## 2017-11-15 MED ORDER — TETRACAINE HCL 0.5 % OP SOLN
1.0000 [drp] | OPHTHALMIC | Status: DC | PRN
  Administered 2017-11-15 (×2): 1 [drp] via OPHTHALMIC

## 2017-11-15 MED ORDER — NA HYALUR & NA CHOND-NA HYALUR 0.4-0.35 ML IO KIT
PACK | INTRAOCULAR | Status: DC | PRN
  Administered 2017-11-15: 1 mL via INTRAOCULAR

## 2017-11-15 MED ORDER — OXYCODONE HCL 5 MG PO TABS: 5.0000 mg | ORAL_TABLET | Freq: Once | ORAL | Status: DC | PRN

## 2017-11-15 MED ORDER — FENTANYL CITRATE (PF) 100 MCG/2ML IJ SOLN
INTRAMUSCULAR | Status: DC | PRN
  Administered 2017-11-15: 50 ug via INTRAVENOUS

## 2017-11-15 MED ORDER — EPINEPHRINE PF 1 MG/ML IJ SOLN
INTRAOCULAR | Status: DC | PRN
  Administered 2017-11-15: 66 mL via OPHTHALMIC

## 2017-11-15 MED ORDER — OXYCODONE HCL 5 MG/5ML PO SOLN: 5.0000 mg | Freq: Once | ORAL | Status: DC | PRN

## 2017-11-15 MED ORDER — BRIMONIDINE TARTRATE-TIMOLOL 0.2-0.5 % OP SOLN
OPHTHALMIC | Status: DC | PRN
  Administered 2017-11-15: 1 [drp] via OPHTHALMIC

## 2017-11-15 SURGICAL SUPPLY — 20 items
CANNULA ANT/CHMB 27G (MISCELLANEOUS) ×1 IMPLANT
CANNULA ANT/CHMB 27GA (MISCELLANEOUS) ×3 IMPLANT
GLOVE SURG LX 7.5 STRW (GLOVE) ×2
GLOVE SURG LX STRL 7.5 STRW (GLOVE) ×1 IMPLANT
GLOVE SURG TRIUMPH 8.0 PF LTX (GLOVE) ×3 IMPLANT
GOWN STRL REUS W/ TWL LRG LVL3 (GOWN DISPOSABLE) ×2 IMPLANT
GOWN STRL REUS W/TWL LRG LVL3 (GOWN DISPOSABLE) ×4
LENS IOL TECNIS ITEC 19.0 (Intraocular Lens) ×2 IMPLANT
MARKER SKIN DUAL TIP RULER LAB (MISCELLANEOUS) ×3 IMPLANT
NDL FILTER BLUNT 18X1 1/2 (NEEDLE) ×1 IMPLANT
NEEDLE FILTER BLUNT 18X 1/2SAF (NEEDLE) ×2
NEEDLE FILTER BLUNT 18X1 1/2 (NEEDLE) ×1 IMPLANT
PACK CATARACT BRASINGTON (MISCELLANEOUS) ×3 IMPLANT
PACK EYE AFTER SURG (MISCELLANEOUS) ×3 IMPLANT
PACK OPTHALMIC (MISCELLANEOUS) ×3 IMPLANT
SYR 3ML LL SCALE MARK (SYRINGE) ×3 IMPLANT
SYR 5ML LL (SYRINGE) ×3 IMPLANT
SYR TB 1ML LUER SLIP (SYRINGE) ×3 IMPLANT
WATER STERILE IRR 500ML POUR (IV SOLUTION) ×3 IMPLANT
WIPE NON LINTING 3.25X3.25 (MISCELLANEOUS) ×3 IMPLANT

## 2017-11-15 NOTE — Anesthesia Procedure Notes (Signed)
Procedure Name: MAC Date/Time: 11/15/2017 9:07 AM Performed by: Janna Arch, CRNA Pre-anesthesia Checklist: Patient identified, Emergency Drugs available, Suction available and Patient being monitored Patient Re-evaluated:Patient Re-evaluated prior to induction Oxygen Delivery Method: Nasal cannula

## 2017-11-15 NOTE — Anesthesia Postprocedure Evaluation (Signed)
Anesthesia Post Note  Patient: Mary Howe  Procedure(s) Performed: CATARACT EXTRACTION PHACO AND INTRAOCULAR LENS PLACEMENT ([place])  LEFT (Left Eye)  Patient location during evaluation: PACU Anesthesia Type: MAC Level of consciousness: awake and alert Pain management: pain level controlled Vital Signs Assessment: post-procedure vital signs reviewed and stable Respiratory status: spontaneous breathing, nonlabored ventilation, respiratory function stable and patient connected to nasal cannula oxygen Cardiovascular status: stable and blood pressure returned to baseline Postop Assessment: no apparent nausea or vomiting Anesthetic complications: no    Akoni Parton ELAINE

## 2017-11-15 NOTE — Op Note (Signed)
OPERATIVE NOTE  Mary Howe [id number] 11/15/2017   PREOPERATIVE DIAGNOSIS:  Nuclear sclerotic cataract left eye. H25.12   POSTOPERATIVE DIAGNOSIS:    Nuclear sclerotic cataract left eye.     PROCEDURE:  Phacoemusification with posterior chamber intraocular lens placement of the left eye   LENS:   Implant Name Type Inv. Item Serial No. Manufacturer Lot No. LRB No. Used  LENS IOL DIOP 19.0 - [id number] Intraocular Lens LENS IOL DIOP 19.0 [id number]  Left 1        ULTRASOUND TIME: 14  % of 1 minutes 13 seconds, CDE 10.5  SURGEON:  Wyonia Hough, MD   ANESTHESIA:  Topical with tetracaine drops and 2% Xylocaine jelly, augmented with 1% preservative-free intracameral lidocaine.    COMPLICATIONS:  None.   DESCRIPTION OF PROCEDURE:  The patient was identified in the holding room and transported to the operating room and placed in the supine position under the operating microscope.  The left eye was identified as the operative eye and it was prepped and draped in the usual sterile ophthalmic fashion.   A 1 millimeter clear-corneal paracentesis was made at the 1:30 position.  0.5 ml of preservative-free 1% lidocaine was injected into the anterior chamber.  The anterior chamber was filled with Viscoat viscoelastic.  A 2.4 millimeter keratome was used to make a near-clear corneal incision at the 10:30 position.  .  A curvilinear capsulorrhexis was made with a cystotome and capsulorrhexis forceps.  Balanced salt solution was used to hydrodissect and hydrodelineate the nucleus.   Phacoemulsification was then used in stop and chop fashion to remove the lens nucleus and epinucleus.  The remaining cortex was then removed using the irrigation and aspiration handpiece. Provisc was then placed into the capsular bag to distend it for lens placement.  A lens was then injected into the capsular bag.  The remaining viscoelastic was aspirated.   Wounds were hydrated with balanced salt  solution.  The anterior chamber was inflated to a physiologic pressure with balanced salt solution.  No wound leaks were noted. Cefuroxime 0.1 ml of a 10mg /ml solution was injected into the anterior chamber for a dose of 1 mg of intracameral antibiotic at the completion of the case.   Timolol and Brimonidine drops were applied to the eye.  The patient was taken to the recovery room in stable condition without complications of anesthesia or surgery.  Mary Howe 11/15/2017, 9:22 AM

## 2017-11-15 NOTE — H&P (Signed)
The History and Physical notes are on paper, have been signed, and are to be scanned. The patient remains stable and unchanged from the H&P.   Previous H&P reviewed, patient examined, and there are no changes.  Mary Howe 11/15/2017 8:05 AM

## 2017-11-15 NOTE — Transfer of Care (Signed)
Immediate Anesthesia Transfer of Care Note  Patient: Mary Howe  Procedure(s) Performed: CATARACT EXTRACTION PHACO AND INTRAOCULAR LENS PLACEMENT ([facility])  LEFT (Left Eye)  Patient Location: PACU  Anesthesia Type: MAC  Level of Consciousness: awake, alert  and patient cooperative  Airway and Oxygen Therapy: Patient Spontanous Breathing and Patient connected to supplemental oxygen  Post-op Assessment: Post-op Vital signs reviewed, Patient's Cardiovascular Status Stable, Respiratory Function Stable, Patent Airway and No signs of Nausea or vomiting  Post-op Vital Signs: Reviewed and stable  Complications: No apparent anesthesia complications

## 2017-11-23 ENCOUNTER — Ambulatory Visit: Payer: Self-pay | Admitting: Internal Medicine

## 2017-11-28 ENCOUNTER — Encounter: Payer: Self-pay | Admitting: Internal Medicine

## 2017-11-28 ENCOUNTER — Ambulatory Visit (INDEPENDENT_AMBULATORY_CARE_PROVIDER_SITE_OTHER): Payer: Medicare Other | Admitting: Internal Medicine

## 2017-11-28 VITALS — BP 122/68 | HR 68 | Temp 97.9°F | Resp 15 | Ht 64.0 in | Wt 131.1 lb

## 2017-11-28 DIAGNOSIS — J431 Panlobular emphysema: Secondary | ICD-10-CM | POA: Diagnosis not present

## 2017-11-28 DIAGNOSIS — R7303 Prediabetes: Secondary | ICD-10-CM | POA: Diagnosis not present

## 2017-11-28 DIAGNOSIS — E7800 Pure hypercholesterolemia, unspecified: Secondary | ICD-10-CM

## 2017-11-28 DIAGNOSIS — Z8639 Personal history of other endocrine, nutritional and metabolic disease: Secondary | ICD-10-CM

## 2017-11-28 DIAGNOSIS — I10 Essential (primary) hypertension: Secondary | ICD-10-CM | POA: Diagnosis not present

## 2017-11-28 DIAGNOSIS — E559 Vitamin D deficiency, unspecified: Secondary | ICD-10-CM | POA: Diagnosis not present

## 2017-11-28 DIAGNOSIS — E119 Type 2 diabetes mellitus without complications: Secondary | ICD-10-CM | POA: Diagnosis not present

## 2017-11-28 LAB — LIPID PANEL
CHOL/HDL RATIO: 2
CHOLESTEROL: 208 mg/dL — AB (ref 0–200)
HDL: 90.4 mg/dL (ref >39.00)
LDL Cholesterol: 90 mg/dL (ref 0–99)
NonHDL: 117.37
Triglycerides: 136 mg/dL (ref 0.0–149.0)
VLDL: 27.2 mg/dL (ref 0.0–40.0)

## 2017-11-28 LAB — COMPREHENSIVE METABOLIC PANEL
ALT: 15 U/L (ref 0–35)
AST: 20 U/L (ref 0–37)
Albumin: 4.3 g/dL (ref 3.5–5.2)
Alkaline Phosphatase: 53 U/L (ref 39–117)
BUN: 17 mg/dL (ref 6–23)
CHLORIDE: 100 meq/L (ref 96–112)
CO2: 30 mEq/L (ref 19–32)
Calcium: 9.7 mg/dL (ref 8.4–10.5)
Creatinine, Ser: 0.67 mg/dL (ref 0.40–1.20)
GFR: 89.29 mL/min (ref >60.00)
GLUCOSE: 116 mg/dL — AB (ref 70–99)
POTASSIUM: 4.7 meq/L (ref 3.5–5.1)
Sodium: 137 mEq/L (ref 135–145)
TOTAL PROTEIN: 7.2 g/dL (ref 6.0–8.3)
Total Bilirubin: 0.6 mg/dL (ref 0.2–1.2)

## 2017-11-28 LAB — HEMOGLOBIN A1C: Hgb A1c MFr Bld: 6 % (ref 4.6–6.5)

## 2017-11-28 LAB — VITAMIN D 25 HYDROXY (VIT D DEFICIENCY, FRACTURES): VITD: 35.53 ng/mL (ref 30.00–100.00)

## 2017-11-28 LAB — TSH: TSH: 3.97 u[IU]/mL (ref 0.35–4.50)

## 2017-11-28 LAB — MICROALBUMIN / CREATININE URINE RATIO
CREATININE, U: 17.4 mg/dL
MICROALB/CREAT RATIO: 4 mg/g (ref 0.0–30.0)
Microalb, Ur: <0.7 mg/dL (ref 0.0–1.9)

## 2017-11-28 MED ORDER — TETANUS-DIPHTH-ACELL PERTUSSIS 5-2.5-18.5 LF-MCG/0.5 IM SUSP: 0.5000 mL | Freq: Once | INTRAMUSCULAR | 0 refills | Status: DC

## 2017-11-28 NOTE — Progress Notes (Signed)
Subjective:  Patient ID: Mary Howe, female    DOB: 05/25/1932  Age: 84 y.o. MRN: [id number]  CC: The primary encounter diagnosis was Hypercholesterolemia. Diagnoses of History of Graves' disease, Essential hypertension, Prediabetes, Vitamin D deficiency, Panlobular emphysema ([place]), and Diabetes mellitus without complication ([place]) were also pertinent to this visit.  HPI Shavona Gunderman presents for 6 month follow up on hypertension hyperlipidemia,  And diet controlled diabetes.  Patient has no complaints today.  Patient is following a low glycemic index diet about 50% of the time and taking all prescribed medications regularly without side effects.  . Patient is exercising about 3 times per week and not intentionally trying to lose weight .  Patient has had an eye exam in the last 12 months and checks feet regularly for signs of infection.  Patient does not walk barefoot outside,  And denies an numbness tingling or burning in feet. Patient is up to date on all recommended vaccinations  Had cataract surgery on  August 7 and July 2   Graduated from [employer] and now participating in [employer]  3 days per week   Dry weight 125 to 130.  Gollan suggested prn use of furosemide 2nd dose only  for weight gain  Still using it two times daily , 2nd dose by 2 pm. Not tkaig potassium  EF normal by ECHO Dec 9833  Diastolic dysfunctio  noted  Last meal 11:30 protein drink and canned peaches   osteoporosis  noted in 2017 by DEXA .  Suspended aledronate for a short while, has resumed medication.  Taking calcium supplement once daily .   GAD:  Managed with lexapro  Saw podiatry for bunions    Outpatient Medications Prior to Visit  Medication Sig Dispense Refill  . acetaminophen (TYLENOL) 500 MG tablet Take 500 mg by mouth every 6 (six) hours as needed.    Marland Kitchen acyclovir (ZOVIRAX) 400 MG tablet Take 1 tablet (400 mg total) by mouth 5 (five) times daily. 35 tablet 0  . albuterol (PROVENTIL HFA;VENTOLIN  HFA) 108 (90 Base) MCG/ACT inhaler Inhale 1-2 puffs into the lungs every 4 (four) hours as needed for wheezing or shortness of breath. 1 Inhaler 10  . alendronate (FOSAMAX) 70 MG tablet Take 1 tablet (70 mg total) by mouth every 7 (seven) days. Take with a full glass of water on an empty stomach. 4 tablet 11  . benzocaine (ORAJEL) 10 % mucosal gel Use as directed in the mouth or throat 3 (three) times daily as needed for mouth pain. 5.3 g 0  . bisoprolol (ZEBETA) 5 MG tablet Take 0.5 tablets (2.5 mg total) by mouth daily. 45 tablet 1  . calcium carbonate (OS-CAL) 600 MG TABS tablet Take 600 mg by mouth daily with breakfast.     . cetirizine (ZYRTEC) 10 MG tablet Take 10 mg by mouth daily.    . diphenhydrAMINE (BENADRYL) 25 MG tablet Take 25 mg by mouth at bedtime as needed.    Marland Kitchen escitalopram (LEXAPRO) 10 MG tablet TAKE 1 TABLET(10 MG) BY MOUTH DAILY 90 tablet 1  . furosemide (LASIX) 20 MG tablet Take 1 tablet (20 mg total) by mouth 2 (two) times daily. 60 tablet 11  . guaiFENesin (ROBITUSSIN) 100 MG/5ML SOLN Take 10 mLs (200 mg total) by mouth every 4 (four) hours as needed for cough or to loosen phlegm. 1200 mL 0  . lidocaine (LIDODERM) 5 % Place 1 patch onto the skin daily. Remove & Discard patch within 12 hours  or as directed by MD 30 patch 0  . MULTIPLE VITAMIN PO Take 1 tablet by mouth daily.     . OXYGEN Inhale 2.5 L daily into the lungs.    . pantoprazole (PROTONIX) 40 MG tablet TAKE 1 TABLET(40 MG) BY MOUTH DAILY 90 tablet 1  . valACYclovir (VALTREX) 1000 MG tablet Take 1,000 mg by mouth 2 (two) times daily.     . budesonide (PULMICORT) 0.5 MG/2ML nebulizer solution Take 2 mLs (0.5 mg total) by nebulization 2 (two) times daily. 120 mL 11   No facility-administered medications prior to visit.     Review of Systems;  Patient denies headache, fevers, malaise, unintentional weight loss, skin rash, eye pain, sinus congestion and sinus pain, sore throat, dysphagia,  hemoptysis , cough,  dyspnea, wheezing, chest pain, palpitations, orthopnea, edema, abdominal pain, nausea, melena, diarrhea, constipation, flank pain, dysuria, hematuria, urinary  Frequency, nocturia, numbness, tingling, seizures,  Focal weakness, Loss of consciousness,  Tremor, insomnia, depression, anxiety, and suicidal ideation.      Objective:  BP 122/68 (BP Location: Left Arm, Patient Position: Sitting, Cuff Size: Normal)   Pulse 68   Temp 97.9 F (36.6 C) (Oral)   Resp 15   Ht 5\' 4"  (1.626 m)   Wt 131 lb 2 oz (59.5 kg)   SpO2 95%   BMI 22.51 kg/m   BP Readings from Last 3 Encounters:  11/28/17 122/68  11/15/17 (!) 104/55  10/10/17 116/62    Wt Readings from Last 3 Encounters:  11/28/17 131 lb 2 oz (59.5 kg)  11/15/17 130 lb (59 kg)  10/10/17 132 lb (59.9 kg)    General appearance: alert, cooperative and appears stated age Ears: normal TM's and external ear canals both ears Throat: lips, mucosa, and tongue normal; teeth and gums normal Neck: no adenopathy, no carotid bruit, supple, symmetrical, trachea midline and thyroid not enlarged, symmetric, no tenderness/mass/nodules Back: symmetric, no curvature. ROM normal. No CVA tenderness. Lungs: clear to auscultation bilaterally Heart: regular rate and rhythm, S1, S2 normal, no murmur, click, rub or gallop Abdomen: soft, non-tender; bowel sounds normal; no masses,  no organomegaly Pulses: 2+ and symmetric Skin: Skin color, texture, turgor normal. No rashes or lesions Lymph nodes: Cervical, supraclavicular, and axillary nodes normal.  Lab Results  Component Value Date   HGBA1C 6.0 11/28/2017   HGBA1C 5.8 (H) 05/22/2017   HGBA1C 6.1 11/10/2016    Lab Results  Component Value Date   CREATININE 0.67 11/28/2017   CREATININE 0.74 05/30/2017   CREATININE 0.61 05/22/2017    Lab Results  Component Value Date   WBC 11.1 (H) 01/16/2017   HGB 14.8 01/16/2017   HCT 43.0 01/16/2017   PLT 282 01/16/2017   GLUCOSE 116 (H) 11/28/2017   CHOL  208 (H) 11/28/2017   TRIG 136.0 11/28/2017   HDL 90.40 11/28/2017   LDLDIRECT 89.0 07/01/2016   LDLCALC 90 11/28/2017   ALT 15 11/28/2017   AST 20 11/28/2017   NA 137 11/28/2017   K 4.7 11/28/2017   CL 100 11/28/2017   CREATININE 0.67 11/28/2017   BUN 17 11/28/2017   CO2 30 11/28/2017   TSH 3.97 11/28/2017   INR 1.09 01/16/2017   HGBA1C 6.0 11/28/2017   MICROALBUR <0.7 11/28/2017    No results found.  Assessment & Plan:   Problem List Items Addressed This Visit    Hypercholesterolemia - Primary    Mild, Untreated due to liver enzyme elevation  Lab Results  Component Value Date  CHOL 208 (H) 11/28/2017   HDL 90.40 11/28/2017   LDLCALC 90 11/28/2017   LDLDIRECT 89.0 07/01/2016   TRIG 136.0 11/28/2017   CHOLHDL 2 11/28/2017         Relevant Orders   Lipid panel (Completed)   Essential hypertension    Well controlled on current regimen of 2.5 mg bystolic . Renal function stable, no changes today.  Lab Results  Component Value Date   CREATININE 0.67 11/28/2017   Lab Results  Component Value Date   NA 137 11/28/2017   K 4.7 11/28/2017   CL 100 11/28/2017   CO2 30 11/28/2017         Relevant Orders   Comprehensive metabolic panel (Completed)   Microalbumin / creatinine urine ratio (Completed)   History of Graves' disease   Relevant Orders   TSH (Completed)   COPD (chronic obstructive pulmonary disease) ([place])    secondary to advance panlobular emphysema Fev/FVC of 60%.  She has improved her exerise tolerance and is now 02 dependent only at night.  Sleep study done Feb 2019 indicates that 74% of her time asleep is spent with an 02 sat < 90%  She is using nocturnal oxygen  And has increased use of Pulmicort to twice daily       Diabetes mellitus without complication ([place])    Diagnosed with fasting glucose of 125 and a1c 6.4 . A1c has  Improved with low gi diet.  no medications needed   Lab Results  Component Value Date   HGBA1C 6.0 11/28/2017   Lab  Results  Component Value Date   MICROALBUR <0.7 11/28/2017         Other Visit Diagnoses    Prediabetes       Relevant Orders   Hemoglobin A1c (Completed)   Vitamin D deficiency       Relevant Orders   VITAMIN D 25 Hydroxy (Vit-D Deficiency, Fractures) (Completed)    A total of 25 minutes of face to face time was spent with patient more than half of which was spent in counselling about the above mentioned conditions  and coordination of care   I have discontinued Janiyha Haslem's Tdap. I am also having her maintain her MULTIPLE VITAMIN PO, calcium carbonate, valACYclovir, albuterol, diphenhydrAMINE, acetaminophen, benzocaine, lidocaine, cetirizine, guaiFENesin, furosemide, OXYGEN, budesonide, bisoprolol, alendronate, acyclovir, pantoprazole, and escitalopram.  Meds ordered this encounter  Medications  . DISCONTD: Tdap (BOOSTRIX) 5-2.5-18.5 LF-MCG/0.5 injection    Sig: Inject 0.5 mLs into the muscle once for 1 dose.    Dispense:  0.5 mL    Refill:  0    Medications Discontinued During This Encounter  Medication Reason  . Tdap ([place]) 5-2.5-18.5 LF-MCG/0.5 injection     Follow-up: Return in about 6 months (around 05/31/2018).   [name], MD

## 2017-11-28 NOTE — Patient Instructions (Signed)
I agree with Dr Rockey Situ about your use of furosemie.  You can  Reduce your daily use of it to once daily, and add a second dose for a weight gain of 2 lbs overnight OF 5 lbs over a week  (USING YOUR SCALE)

## 2017-11-29 NOTE — Assessment & Plan Note (Signed)
secondary to advance panlobular emphysema Fev/FVC of 60%.  She has improved her exerise tolerance and is now 02 dependent only at night.  Sleep study done Feb 2019 indicates that 74% of her time asleep is spent with an 02 sat < 90%  She is using nocturnal oxygen  And has increased use of Pulmicort to twice daily  

## 2017-11-29 NOTE — Assessment & Plan Note (Signed)
Diagnosed with fasting glucose of 125 and a1c 6.4 . A1c has  Improved with low gi diet.  no medications needed   Lab Results  Component Value Date   HGBA1C 6.0 11/28/2017   Lab Results  Component Value Date   MICROALBUR <0.7 11/28/2017

## 2017-11-29 NOTE — Assessment & Plan Note (Signed)
Well controlled on current regimen of 2.5 mg bystolic . Renal function stable, no changes today.  Lab Results  Component Value Date   CREATININE 0.67 11/28/2017   Lab Results  Component Value Date   NA 137 11/28/2017   K 4.7 11/28/2017   CL 100 11/28/2017   CO2 30 11/28/2017

## 2017-11-29 NOTE — Assessment & Plan Note (Signed)
Mild, Untreated due to liver enzyme elevation  Lab Results  Component Value Date   CHOL 208 (H) 11/28/2017   HDL 90.40 11/28/2017   LDLCALC 90 11/28/2017   LDLDIRECT 89.0 07/01/2016   TRIG 136.0 11/28/2017   CHOLHDL 2 11/28/2017

## 2017-12-22 ENCOUNTER — Other Ambulatory Visit: Payer: Self-pay | Admitting: Internal Medicine

## 2017-12-22 DIAGNOSIS — K219 Gastro-esophageal reflux disease without esophagitis: Secondary | ICD-10-CM

## 2018-01-12 DIAGNOSIS — H6060 Unspecified chronic otitis externa, unspecified ear: Secondary | ICD-10-CM | POA: Diagnosis not present

## 2018-01-12 DIAGNOSIS — H903 Sensorineural hearing loss, bilateral: Secondary | ICD-10-CM | POA: Diagnosis not present

## 2018-01-18 ENCOUNTER — Ambulatory Visit (INDEPENDENT_AMBULATORY_CARE_PROVIDER_SITE_OTHER): Payer: Medicare Other

## 2018-01-18 DIAGNOSIS — Z23 Encounter for immunization: Secondary | ICD-10-CM | POA: Diagnosis not present

## 2018-01-18 NOTE — Progress Notes (Signed)
Pt here today for NV was given high dose flu shot in LD.   Pt tolerated well.

## 2018-01-19 ENCOUNTER — Encounter: Payer: Self-pay | Admitting: Family Medicine

## 2018-01-19 ENCOUNTER — Ambulatory Visit (INDEPENDENT_AMBULATORY_CARE_PROVIDER_SITE_OTHER): Payer: Medicare Other | Admitting: Family Medicine

## 2018-01-19 VITALS — BP 100/58 | HR 76 | Temp 98.0°F | Ht 64.0 in | Wt 130.0 lb

## 2018-01-19 DIAGNOSIS — M79662 Pain in left lower leg: Secondary | ICD-10-CM

## 2018-01-19 DIAGNOSIS — R229 Localized swelling, mass and lump, unspecified: Secondary | ICD-10-CM | POA: Diagnosis not present

## 2018-01-19 DIAGNOSIS — IMO0002 Reserved for concepts with insufficient information to code with codable children: Secondary | ICD-10-CM

## 2018-01-19 NOTE — Patient Instructions (Signed)
Come back next week for your Xray

## 2018-01-19 NOTE — Progress Notes (Signed)
Subjective:    Patient ID: Mary Howe, female    DOB: 12/18/1932, 84 y.o.   MRN: [id number]  HPI   Patient presents to clinic with a hard lump area on left lower leg on shin.  Denies any injury to area.  States she noticed it about a month ago.  Surrounding skin has not been red or hot.  No fever or chills.  Most recent lab work done in August 2019 reviewed, stable  CBC Latest Ref Rng & Units 01/16/2017 01/14/2017 01/12/2017  WBC 3.6 - 11.0 K/uL 11.1(H) 12.0(H) 11.9(H)  Hemoglobin 12.0 - 16.0 g/dL 14.8 13.4 14.4  Hematocrit 35.0 - 47.0 % 43.0 39.5 42.0  Platelets 150 - 440 K/uL 282 196 276   CMP Latest Ref Rng & Units 11/28/2017 05/30/2017 05/22/2017  Glucose 70 - 99 mg/dL 116(H) 88 110(H)  BUN 6 - 23 mg/dL 17 17 13   Creatinine 0.40 - 1.20 mg/dL 0.67 0.74 0.61  Sodium 135 - 145 mEq/L 137 140 136  Potassium 3.5 - 5.1 mEq/L 4.7 4.4 5.2(H)  Chloride 96 - 112 mEq/L 100 103 100  CO2 19 - 32 mEq/L 30 33(H) 28  Calcium 8.4 - 10.5 mg/dL 9.7 9.2 9.3  Total Protein 6.0 - 8.3 g/dL 7.2 - 7.1  Total Bilirubin 0.2 - 1.2 mg/dL 0.6 - 0.5  Alkaline Phos 39 - 117 U/L 53 - 48  AST 0 - 37 U/L 20 - 23  ALT 0 - 35 U/L 15 - 16     Patient Active Problem List   Diagnosis Date Noted  . Leg swelling 06/13/2017  . Nocturnal hypoxia 05/22/2017  . NICM (nonischemic cardiomyopathy) ([place]) 05/22/2017  . Acute systolic heart failure ([place])   . Elevated troponin 01/11/2017  . Statin intolerance 12/17/2016  . Left renal mass 11/25/2016  . S/P cholecystectomy 11/25/2016  . Elevated liver enzymes 11/12/2016  . Diabetes mellitus without complication ([place]) 94/49/6759  . Anxiety, generalized 06/22/2016  . Back pain 06/10/2016  . Post-nasal drip 05/07/2016  . COPD (chronic obstructive pulmonary disease) ([place]) 02/06/2016  . History of shingles 01/30/2016  . Age-related osteoporosis with current pathol fracture of vertebra ([place]) 01/30/2016  . Essential hypertension 01/30/2016  . History of Graves' disease  01/30/2016  . Hospital discharge follow-up 01/30/2016  . DD (diverticular disease) 02/19/2015  . Acid reflux 02/19/2015  . 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 Hypercholesterolemia 02/19/2015  . Neuropathy 02/19/2015  . 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 Stasis, venous 02/19/2015   Social History   Tobacco Use  . Smoking status: Former Smoker    Packs/day: 1.50    Years: 29.00    Pack years: 43.50    Types: Cigarettes    Last attempt to quit: 04/11/1988    Years since quitting: 29.7  . Smokeless tobacco: Never Used  . Tobacco comment: quit smoking in 1990  Substance Use Topics  . Alcohol use: Yes    Alcohol/week: 7.0 standard drinks    Types: 7 Glasses of wine per week    Comment:     Review of Systems  Constitutional: Negative for chills, fatigue and fever.  HENT: Negative for congestion, ear pain, sinus pain and sore throat.   Eyes: Negative.   Respiratory: Negative for cough, shortness of breath and wheezing.   Cardiovascular: Negative for chest pain, palpitations and leg swelling.  Gastrointestinal: Negative for abdominal pain, diarrhea, nausea and vomiting.  Genitourinary: Negative for dysuria, frequency and urgency.  Musculoskeletal: Negative for arthralgias  and myalgias.  Skin: Negative for color change, pallor and rash. "bump on left leg" Neurological: Negative for syncope, light-headedness and headaches.  Psychiatric/Behavioral: The patient is not nervous/anxious.       Objective:   Physical Exam  Constitutional: She is oriented to person, place, and time. No distress.  HENT:  Head: Normocephalic and atraumatic.  Eyes: No scleral icterus.  Neck: Neck supple. No tracheal deviation present.  Cardiovascular: Normal rate and regular rhythm.  Pulmonary/Chest: Effort normal and breath sounds normal. No respiratory distress.  Musculoskeletal: Normal range of motion. She exhibits tenderness. She exhibits no edema.    Small bump on left shin, approx size of large pea. Is slightly tender when palpated. It is hard.   Neurological: She is oriented to person, place, and time.  Skin: Skin is warm and dry. Capillary refill takes less than 2 seconds. No rash noted. No erythema. No pallor.  Psychiatric: She has a normal mood and affect. Her behavior is normal.  Nursing note and vitals reviewed.   Vitals:   01/19/18 1621  BP: (!) 100/58  Pulse: 76  Temp: 98 F (36.7 C)  SpO2: 95%    Assessment & Plan:    Lump on left leg --- Area feels hard, could be calcium growth or small cyst. We will get XRAY of area to further evaluate. Advised to monitor area for any size changes, changes in skin color.   Keep regularly scheduled follow up as planned. Return to clinic sooner if issues arise.

## 2018-01-22 ENCOUNTER — Encounter: Payer: Self-pay | Admitting: Family Medicine

## 2018-01-24 ENCOUNTER — Ambulatory Visit (INDEPENDENT_AMBULATORY_CARE_PROVIDER_SITE_OTHER): Payer: Medicare Other

## 2018-01-24 DIAGNOSIS — IMO0002 Reserved for concepts with insufficient information to code with codable children: Secondary | ICD-10-CM

## 2018-01-24 DIAGNOSIS — R2242 Localized swelling, mass and lump, left lower limb: Secondary | ICD-10-CM

## 2018-01-24 DIAGNOSIS — R229 Localized swelling, mass and lump, unspecified: Principal | ICD-10-CM

## 2018-01-24 DIAGNOSIS — M1712 Unilateral primary osteoarthritis, left knee: Secondary | ICD-10-CM | POA: Diagnosis not present

## 2018-01-29 DIAGNOSIS — M3501 Sicca syndrome with keratoconjunctivitis: Secondary | ICD-10-CM | POA: Diagnosis not present

## 2018-02-01 ENCOUNTER — Ambulatory Visit (INDEPENDENT_AMBULATORY_CARE_PROVIDER_SITE_OTHER): Payer: Medicare Other | Admitting: Family Medicine

## 2018-02-01 ENCOUNTER — Ambulatory Visit (INDEPENDENT_AMBULATORY_CARE_PROVIDER_SITE_OTHER): Payer: Medicare Other

## 2018-02-01 ENCOUNTER — Encounter: Payer: Self-pay | Admitting: Family Medicine

## 2018-02-01 VITALS — BP 110/60 | HR 73 | Temp 98.2°F | Ht 62.5 in | Wt 130.0 lb

## 2018-02-01 DIAGNOSIS — J439 Emphysema, unspecified: Secondary | ICD-10-CM | POA: Diagnosis not present

## 2018-02-01 DIAGNOSIS — R059 Cough, unspecified: Secondary | ICD-10-CM

## 2018-02-01 DIAGNOSIS — R05 Cough: Secondary | ICD-10-CM | POA: Diagnosis not present

## 2018-02-01 DIAGNOSIS — R918 Other nonspecific abnormal finding of lung field: Secondary | ICD-10-CM

## 2018-02-01 DIAGNOSIS — J449 Chronic obstructive pulmonary disease, unspecified: Secondary | ICD-10-CM | POA: Diagnosis not present

## 2018-02-01 DIAGNOSIS — J069 Acute upper respiratory infection, unspecified: Secondary | ICD-10-CM

## 2018-02-01 MED ORDER — PREDNISONE 10 MG PO TABS: 10.0000 mg | ORAL_TABLET | Freq: Every day | ORAL | 1 refills | Status: DC

## 2018-02-01 MED ORDER — CETIRIZINE HCL 10 MG PO TABS: 10.0000 mg | ORAL_TABLET | Freq: Every day | ORAL | 1 refills | Status: DC

## 2018-02-01 NOTE — Progress Notes (Signed)
Subjective:    Patient ID: Mary Howe, female    DOB: 1932-06-12, 84 y.o.   MRN: [id number]  HPI   Patient presents to clinic complaining of congestion and cough for about 1 week.  Patient has a history of emphysema, so wants to be sure she does not have any pneumonia.  She has been using her nebulizer treatment 2 times a day which has been helpful.  She also has started using a over-the-counter Robitussin cough syrup which seems to work well to calm cough.  Patient denies any wheezing or being unable to catch her breath.  Denies fever or chills.  Unknown if she has had any sick contacts.  Patient Active Problem List   Diagnosis Date Noted  . Leg swelling 06/13/2017  . Nocturnal hypoxia 05/22/2017  . NICM (nonischemic cardiomyopathy) ([place]) 05/22/2017  . Acute systolic heart failure ([place])   . Elevated troponin 01/11/2017  . Statin intolerance 12/17/2016  . Left renal mass 11/25/2016  . S/P cholecystectomy 11/25/2016  . Elevated liver enzymes 11/12/2016  . Diabetes mellitus without complication ([place]) 94/17/4081  . Anxiety, generalized 06/22/2016  . Back pain 06/10/2016  . Post-nasal drip 05/07/2016  . COPD (chronic obstructive pulmonary disease) ([place]) 02/06/2016  . History of shingles 01/30/2016  . Age-related osteoporosis with current pathol fracture of vertebra ([employer]) 01/30/2016  . Essential hypertension 01/30/2016  . History of Graves' disease 01/30/2016  . Hospital discharge follow-up 01/30/2016  . DD (diverticular disease) 02/19/2015  . Acid reflux 02/19/2015  . 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 Hypercholesterolemia 02/19/2015  . Neuropathy 02/19/2015  . 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 Stasis, venous 02/19/2015   Social History   Tobacco Use  . Smoking status: Former Smoker    Packs/day: 1.50    Years: 29.00    Pack years: 43.50    Types: Cigarettes    Last attempt to quit: 04/11/1988    Years since quitting:  29.8  . Smokeless tobacco: Never Used  . Tobacco comment: quit smoking in 1990  Substance Use Topics  . Alcohol use: Yes    Alcohol/week: 7.0 standard drinks    Types: 7 Glasses of wine per week    Comment:     Review of Systems  Constitutional: Negative for chills, fatigue and fever.  HENT: Negative for congestion, ear pain, sinus pain and sore throat.   Eyes: Negative.   Respiratory: Negative for cough, shortness of breath and wheezing.   Cardiovascular: Negative for chest pain, palpitations and leg swelling.  Gastrointestinal: Negative for abdominal pain, diarrhea, nausea and vomiting.  Genitourinary: Negative for dysuria, frequency and urgency.  Musculoskeletal: Negative for arthralgias and myalgias.  Skin: Negative for color change, pallor and rash.  Neurological: Negative for syncope, light-headedness and headaches.  Psychiatric/Behavioral: The patient is not nervous/anxious.    Objective:   Physical Exam  Constitutional: She is oriented to person, place, and time. She appears well-nourished. No distress.  HENT:  Head: Normocephalic and atraumatic.  Eyes: EOM are normal. No scleral icterus.  Neck: Neck supple. No tracheal deviation present.  Cardiovascular: Normal rate and regular rhythm.  Pulmonary/Chest: Effort normal and breath sounds normal. No respiratory distress. She has no wheezes. She has no rales.  Neurological: She is alert and oriented to person, place, and time.  Skin: Skin is warm and dry. She is not diaphoretic. No pallor.  Psychiatric: She has a normal mood and affect. Her behavior is normal.  Nursing note and vitals reviewed.  Vitals:   02/01/18 1026  BP: 110/60  Pulse: 73  Temp: 98.2 F (36.8 C)  SpO2: 98%   Assessment & Plan:   Cough, emphysema, viral URI - I do suspect patient's symptoms are related to a viral upper respiratory tract infection.  Due to history of emphysema we will get chest x-ray to rule out any pneumonia.  Patient will  continue using nebulizer twice a day and also has rescue inhaler she carries with her in person to use if needed.  She will take prednisone burst for 5 days.  She will continue use Robitussin over-the-counter cough syrup as this is been helpful for her cough.  Patient aware we will call her with CXR results and will change treatment plan if needed based on those results.  Advised that if her breathing worsens to call office right away and/or go to emergency room.  Keep regularly scheduled follow-up as planned.  Return to clinic sooner if any issues arise.

## 2018-02-02 ENCOUNTER — Telehealth: Payer: Self-pay | Admitting: *Deleted

## 2018-02-02 DIAGNOSIS — J439 Emphysema, unspecified: Secondary | ICD-10-CM

## 2018-02-02 DIAGNOSIS — R059 Cough, unspecified: Secondary | ICD-10-CM

## 2018-02-02 DIAGNOSIS — R05 Cough: Secondary | ICD-10-CM

## 2018-02-02 MED ORDER — DOXYCYCLINE HYCLATE 100 MG PO TABS: 100.0000 mg | ORAL_TABLET | Freq: Two times a day (BID) | ORAL | 0 refills | Status: DC

## 2018-02-02 MED ORDER — PREDNISONE 10 MG PO TABS: 10.0000 mg | ORAL_TABLET | Freq: Every day | ORAL | 0 refills | Status: AC

## 2018-02-02 NOTE — Telephone Encounter (Signed)
Copied from [place] [phone]. Topic: General - Other >> Feb 02, 2018  8:07 AM Carolyn Stare wrote:  Pt said she does not want a 90 day supply of the below med.she said she looking for the 21 day packet one week not taking one every one day    predniSONE (DELTASONE) 10 MG tablet

## 2018-02-02 NOTE — Telephone Encounter (Signed)
Looks like you prescribed the prednisone.

## 2018-02-02 NOTE — Telephone Encounter (Signed)
Rx was supposed to be for 5 days only, I have fixed the Rx and re-sent to [facility]

## 2018-02-19 ENCOUNTER — Other Ambulatory Visit: Payer: Self-pay | Admitting: Internal Medicine

## 2018-03-26 ENCOUNTER — Other Ambulatory Visit: Payer: Self-pay | Admitting: Internal Medicine

## 2018-03-26 MED ORDER — FUROSEMIDE 20 MG PO TABS: 20.0000 mg | ORAL_TABLET | Freq: Two times a day (BID) | ORAL | 1 refills | Status: DC

## 2018-03-26 NOTE — Telephone Encounter (Signed)
Refills sent to pharmacy. 

## 2018-03-26 NOTE — Telephone Encounter (Signed)
Copied from [place] [phone]. Topic: Quick Communication - Rx Refill/Question >> Mar 26, 2018  8:00 AM Rayann Heman wrote: Medication: furosemide (LASIX) 20 MG tablet [[id number]] pt states that she needs a 90 day supply   Has the patient contacted their pharmacy? no  Preferred Pharmacy (with phone number or street name): [id number] [address], [place] [phone] (Phone) [phone] (Fax)  Agent: Please be advised that RX refills may take up to 3 business days. We ask that you follow-up with your pharmacy.

## 2018-03-26 NOTE — Addendum Note (Signed)
Addended by: Nanci Pina on: 03/26/2018 08:18 AM   Modules accepted: Orders

## 2018-03-30 ENCOUNTER — Encounter: Payer: Self-pay | Admitting: Family Medicine

## 2018-03-30 ENCOUNTER — Ambulatory Visit (INDEPENDENT_AMBULATORY_CARE_PROVIDER_SITE_OTHER): Payer: Medicare Other | Admitting: Family Medicine

## 2018-03-30 VITALS — BP 118/68 | HR 78 | Temp 98.0°F | Ht 62.5 in | Wt 133.0 lb

## 2018-03-30 DIAGNOSIS — B029 Zoster without complications: Secondary | ICD-10-CM

## 2018-03-30 DIAGNOSIS — F419 Anxiety disorder, unspecified: Secondary | ICD-10-CM

## 2018-03-30 MED ORDER — ALPRAZOLAM 0.25 MG PO TABS: 0.2500 mg | ORAL_TABLET | Freq: Two times a day (BID) | ORAL | 0 refills | Status: DC | PRN

## 2018-03-30 MED ORDER — TRIAMCINOLONE ACETONIDE 0.1 % EX CREA: 1.0000 "application " | TOPICAL_CREAM | Freq: Two times a day (BID) | CUTANEOUS | 0 refills | Status: DC

## 2018-03-30 MED ORDER — VALACYCLOVIR HCL 1 G PO TABS: 1000.0000 mg | ORAL_TABLET | Freq: Three times a day (TID) | ORAL | 0 refills | Status: AC

## 2018-03-30 NOTE — Progress Notes (Signed)
Subjective:    Patient ID: Mary Howe, female    DOB: [birth year], 84 y.o.   MRN: [id number]  HPI  Presents to clinic c/o rash on right upper back.  Patient states she first noticed that about a day ago.  States it began as itchy, now has started to become somewhat painful.  Denies any new lotions, soaps, detergents.  Patient Active Problem List   Diagnosis Date Noted  . Leg swelling 06/13/2017  . Nocturnal hypoxia 05/22/2017  . NICM (nonischemic cardiomyopathy) ([place]) 05/22/2017  . Acute systolic heart failure ([place])   . Elevated troponin 01/11/2017  . Statin intolerance 12/17/2016  . Left renal mass 11/25/2016  . S/P cholecystectomy 11/25/2016  . Elevated liver enzymes 11/12/2016  . Diabetes mellitus without complication ([place]) 56/38/7564  . Anxiety, generalized 06/22/2016  . Back pain 06/10/2016  . Post-nasal drip 05/07/2016  . COPD (chronic obstructive pulmonary disease) ([place]) 02/06/2016  . History of shingles 01/30/2016  . Age-related osteoporosis with current pathol fracture of vertebra ([place]) 01/30/2016  . Essential hypertension 01/30/2016  . History of Graves' disease 01/30/2016  . Hospital discharge follow-up 01/30/2016  . DD (diverticular disease) 02/19/2015  . Acid reflux 02/19/2015  . 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 Hypercholesterolemia 02/19/2015  . Neuropathy 02/19/2015  . 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 Stasis, venous 02/19/2015   Social History   Tobacco Use  . Smoking status: Former Smoker    Packs/day: 1.50    Years: 29.00    Pack years: 43.50    Types: Cigarettes    Last attempt to quit: 04/11/1988    Years since quitting: 29.9  . Smokeless tobacco: Never Used  . Tobacco comment: quit smoking in 1990  Substance Use Topics  . Alcohol use: Yes    Alcohol/week: 7.0 standard drinks    Types: 7 Glasses of wine per week    Comment:     Review of Systems   Constitutional: Negative for chills,  fatigue and fever.  HENT: Negative for congestion, ear pain, sinus pain and sore throat.   Eyes: Negative.   Respiratory: Negative for cough, shortness of breath and wheezing.   Cardiovascular: Negative for chest pain, palpitations and leg swelling.  Gastrointestinal: Negative for abdominal pain, diarrhea, nausea and vomiting.  Genitourinary: Negative for dysuria, frequency and urgency.  Musculoskeletal: Negative for arthralgias and myalgias.  Skin: +rash on back  Neurological: Negative for syncope, light-headedness and headaches.  Psychiatric/Behavioral: The patient is not nervous/anxious.       Objective:   Physical Exam Vitals signs and nursing note reviewed.  Constitutional:      General: She is not in acute distress.    Appearance: Normal appearance. She is not toxic-appearing.  HENT:     Head: Normocephalic and atraumatic.  Eyes:     General: No scleral icterus.    Extraocular Movements: Extraocular movements intact.     Conjunctiva/sclera: Conjunctivae normal.  Cardiovascular:     Rate and Rhythm: Normal rate and regular rhythm.  Pulmonary:     Effort: Pulmonary effort is normal. No respiratory distress.     Breath sounds: Normal breath sounds.  Skin:    General: Skin is warm and dry.     Findings: Rash present.          Comments: Faint red, vesicular looking rash on right upper back.  Looks consistent with shingles  Neurological:     Mental Status: She is alert and oriented to person, place, and time.  Psychiatric:        Mood and Affect: Mood normal.        Behavior: Behavior normal.       Vitals:   03/30/18 1537  BP: 118/68  Pulse: 78  Temp: 98 F (36.7 C)  SpO2: 95%    Assessment & Plan:   Shingles - patient will take Valtrex 3 times daily for 7 days.  She will use thin layer of triamcinolone ointment on a rash area to help improve itching and inflammation.  Vesicles are not open or draining at this time.  Patient will also take steroid taper to help  improve itching and pain from rash.  Keep regularly scheduled follow-up PCP as planned.  Return to clinic sooner if any issues arise.

## 2018-03-31 ENCOUNTER — Encounter: Payer: Self-pay | Admitting: Family Medicine

## 2018-04-28 ENCOUNTER — Other Ambulatory Visit: Payer: Self-pay | Admitting: Internal Medicine

## 2018-04-28 DIAGNOSIS — R918 Other nonspecific abnormal finding of lung field: Secondary | ICD-10-CM

## 2018-04-28 MED ORDER — DOXYCYCLINE HYCLATE 100 MG PO TABS: 100.0000 mg | ORAL_TABLET | Freq: Two times a day (BID) | ORAL | 0 refills | Status: AC

## 2018-04-30 ENCOUNTER — Ambulatory Visit (INDEPENDENT_AMBULATORY_CARE_PROVIDER_SITE_OTHER): Payer: PPO

## 2018-04-30 ENCOUNTER — Encounter: Payer: Self-pay | Admitting: Internal Medicine

## 2018-04-30 ENCOUNTER — Ambulatory Visit (INDEPENDENT_AMBULATORY_CARE_PROVIDER_SITE_OTHER): Payer: PPO | Admitting: Internal Medicine

## 2018-04-30 VITALS — BP 138/70 | HR 64 | Temp 98.0°F | Resp 17 | Ht 62.5 in | Wt 132.0 lb

## 2018-04-30 DIAGNOSIS — Z1239 Encounter for other screening for malignant neoplasm of breast: Secondary | ICD-10-CM | POA: Diagnosis not present

## 2018-04-30 DIAGNOSIS — J441 Chronic obstructive pulmonary disease with (acute) exacerbation: Secondary | ICD-10-CM | POA: Diagnosis not present

## 2018-04-30 DIAGNOSIS — E119 Type 2 diabetes mellitus without complications: Secondary | ICD-10-CM | POA: Diagnosis not present

## 2018-04-30 DIAGNOSIS — R0989 Other specified symptoms and signs involving the circulatory and respiratory systems: Secondary | ICD-10-CM | POA: Diagnosis not present

## 2018-04-30 LAB — COMPREHENSIVE METABOLIC PANEL
ALBUMIN: 4 g/dL (ref 3.5–5.2)
ALK PHOS: 52 U/L (ref 39–117)
ALT: 16 U/L (ref 0–35)
AST: 21 U/L (ref 0–37)
BILIRUBIN TOTAL: 0.5 mg/dL (ref 0.2–1.2)
BUN: 14 mg/dL (ref 6–23)
CO2: 29 mEq/L (ref 19–32)
CREATININE: 0.66 mg/dL (ref 0.40–1.20)
Calcium: 9.8 mg/dL (ref 8.4–10.5)
Chloride: 96 mEq/L (ref 96–112)
GFR: 85.4 mL/min (ref >60.00)
GLUCOSE: 97 mg/dL (ref 70–99)
Potassium: 4 mEq/L (ref 3.5–5.1)
SODIUM: 135 meq/L (ref 135–145)
TOTAL PROTEIN: 7.1 g/dL (ref 6.0–8.3)

## 2018-04-30 LAB — HEMOGLOBIN A1C: HEMOGLOBIN A1C: 6.1 % (ref 4.6–6.5)

## 2018-04-30 NOTE — Patient Instructions (Addendum)
You are still wheezing badly.  Continue using your nebulizer every 6 hours (especially before bedtime)    Re start the prednisone at the 60 mg dose.  Take 60 mg daily for 3 days,  Then start the taper  b10 mg daily  Until gone (this will re [place] 33 pills)  REST.  WEAR YOU OXYGEN AT NIGHT AND IF YOU MUST LEAVE THE HOUSE

## 2018-04-30 NOTE — Progress Notes (Signed)
Subjective:  Patient ID: Mary Howe, female    DOB: [birth year]  Age: 85 y.o. MRN: [id number]  CC: The primary encounter diagnosis was COPD exacerbation ([place]). Diagnoses of Diabetes mellitus without complication ([place]), Breast cancer screening, and COPD with acute exacerbation ([place]) were also pertinent to this visit.  HPI Mary Howe presents for evaluation and treatment of COPD exacerbation.  symptoms started 5 days agowheezing,  And congestion,  Denies body aches a, fever, nausea and vomiting.  She starting taking a prednisone tapering dose  Her daughter contacted me through [employer] with concerns about her mother's failure to improve.  Home oxygen sats were dropping with exertion but remained above 90% with rest.  She started taking doxycycline on [place] (2 days ago)  And presents today for follow up.  She feels somewhat better  Over the last 12 hours but continues to develop mild hypoxia with exertion, as well as chest tightness. She is taking a probiotic.     Outpatient Medications Prior to Visit  Medication Sig Dispense Refill  . acetaminophen (TYLENOL) 500 MG tablet Take 500 mg by mouth every 6 (six) hours as needed.    Marland Kitchen acyclovir (ZOVIRAX) 400 MG tablet Take 1 tablet (400 mg total) by mouth 5 (five) times daily. 35 tablet 0  . albuterol (PROVENTIL HFA;VENTOLIN HFA) 108 (90 Base) MCG/ACT inhaler Inhale 1-2 puffs into the lungs every 4 (four) hours as needed for wheezing or shortness of breath. 1 Inhaler 10  . alendronate (FOSAMAX) 70 MG tablet Take 1 tablet (70 mg total) by mouth every 7 (seven) days. Take with a full glass of water on an empty stomach. 4 tablet 11  . ALPRAZolam (XANAX) 0.25 MG tablet Take 1 tablet (0.25 mg total) by mouth 2 (two) times daily as needed for anxiety. 20 tablet 0  . benzocaine (ORAJEL) 10 % mucosal gel Use as directed in the mouth or throat 3 (three) times daily as needed for mouth pain. 5.3 g 0  . bisoprolol (ZEBETA) 5 MG tablet TAKE 1/2  TABLET(2.5 MG) BY MOUTH DAILY 45 tablet 1  . calcium carbonate (OS-CAL) 600 MG TABS tablet Take 600 mg by mouth daily with breakfast.     . cetirizine (ZYRTEC) 10 MG tablet Take 1 tablet (10 mg total) by mouth daily. 90 tablet 1  . diphenhydrAMINE (BENADRYL) 25 MG tablet Take 25 mg by mouth at bedtime as needed.    . doxycycline (VIBRA-TABS) 100 MG tablet Take 1 tablet (100 mg total) by mouth 2 (two) times daily for 7 days. 14 tablet 0  . escitalopram (LEXAPRO) 10 MG tablet TAKE 1 TABLET(10 MG) BY MOUTH DAILY 90 tablet 0  . furosemide (LASIX) 20 MG tablet Take 1 tablet (20 mg total) by mouth 2 (two) times daily. 180 tablet 1  . guaiFENesin (ROBITUSSIN) 100 MG/5ML SOLN Take 10 mLs (200 mg total) by mouth every 4 (four) hours as needed for cough or to loosen phlegm. 1200 mL 0  . lidocaine (LIDODERM) 5 % Place 1 patch onto the skin daily. Remove & Discard patch within 12 hours or as directed by MD 30 patch 0  . MULTIPLE VITAMIN PO Take 1 tablet by mouth daily.     . OXYGEN Inhale 2.5 L daily into the lungs.    . pantoprazole (PROTONIX) 40 MG tablet TAKE 1 TABLET(40 MG) BY MOUTH DAILY 90 tablet 1  . triamcinolone cream (KENALOG) 0.1 % Apply 1 application topically 2 (two) times daily. 30 g 0  .  valACYclovir (VALTREX) 1000 MG tablet Take 1,000 mg by mouth 2 (two) times daily.     . budesonide (PULMICORT) 0.5 MG/2ML nebulizer solution Take 2 mLs (0.5 mg total) by nebulization 2 (two) times daily. 120 mL 11   No facility-administered medications prior to visit.     Review of Systems;  Patient denies headache, fevers, malaise, unintentional weight loss, skin rash, eye pain, sinus congestion and sinus pain, sore throat, dysphagia,  hemoptysis , , wheezing, chest pain, palpitations, orthopnea, edema, abdominal pain, nausea, melena, diarrhea, constipation, flank pain, dysuria, hematuria, urinary  Frequency, nocturia, numbness, tingling, seizures,  Focal weakness, Loss of consciousness,  Tremor, insomnia,  depression, anxiety, and suicidal ideation.      Objective:  BP 138/70 (BP Location: Left Arm, Patient Position: Sitting, Cuff Size: Normal)   Pulse 64   Temp 98 F (36.7 C) (Oral)   Resp 17   Ht 5' 2.5" (1.588 m)   Wt 132 lb (59.9 kg)   SpO2 91%   BMI 23.76 kg/m   BP Readings from Last 3 Encounters:  04/30/18 138/70  03/30/18 118/68  02/01/18 110/60    Wt Readings from Last 3 Encounters:  04/30/18 132 lb (59.9 kg)  03/30/18 133 lb (60.3 kg)  02/01/18 130 lb (59 kg)    General appearance: alert, cooperative and appears stated age. Talking in complete sentences  Ears: normal TM's and external ear canals both ears Throat: lips, mucosa, and tongue normal; teeth and gums normal Neck: no adenopathy, no carotid bruit, supple, symmetrical, trachea midline and thyroid not enlarged, symmetric, no tenderness/mass/nodules Back: symmetric, no curvature. ROM normal. No CVA tenderness. Lungs: bilateral wheezing,  Poor air movement.  No egopohony or ronchi Heart: regular rate and rhythm, S1, S2 normal, no murmur, click, rub or gallop Abdomen: soft, non-tender; bowel sounds normal; no masses,  no organomegaly Pulses: 2+ and symmetric Skin: Skin color, texture, turgor normal. No rashes or lesions Lymph nodes: Cervical, supraclavicular, and axillary nodes normal.  Lab Results  Component Value Date   HGBA1C 6.1 04/30/2018   HGBA1C 6.0 11/28/2017   HGBA1C 5.8 (H) 05/22/2017    Lab Results  Component Value Date   CREATININE 0.66 04/30/2018   CREATININE 0.67 11/28/2017   CREATININE 0.74 05/30/2017    Lab Results  Component Value Date   WBC 11.1 (H) 01/16/2017   HGB 14.8 01/16/2017   HCT 43.0 01/16/2017   PLT 282 01/16/2017   GLUCOSE 97 04/30/2018   CHOL 208 (H) 11/28/2017   TRIG 136.0 11/28/2017   HDL 90.40 11/28/2017   LDLDIRECT 89.0 07/01/2016   LDLCALC 90 11/28/2017   ALT 16 04/30/2018   AST 21 04/30/2018   NA 135 04/30/2018   K 4.0 04/30/2018   CL 96 04/30/2018    CREATININE 0.66 04/30/2018   BUN 14 04/30/2018   CO2 29 04/30/2018   TSH 3.97 11/28/2017   INR 1.09 01/16/2017   HGBA1C 6.1 04/30/2018   MICROALBUR <0.7 11/28/2017    No results found.  Assessment & Plan:   Problem List Items Addressed This Visit    COPD with acute exacerbation ([place])    Chest x ray done today is negative for acute changes but shows chronic bronchitic changes , bilateral LL scarring and hyperexpansion with hemidiaphragm flattening .  Will increase prednisone to 60 mg daily x 3,  Then resume taper.  Continue nebulized bronchodilators  And steroids, cough suppressants and  oral  doxycycline. Advised to rest except for hygiene and eating activities.  Diabetes mellitus without complication ([place])    Diagnosed with fasting glucose of 125 and a1c 6.4 . A1c has  Improved with low gi diet.  no medications needed   Lab Results  Component Value Date   HGBA1C 6.1 04/30/2018   Lab Results  Component Value Date   MICROALBUR <0.7 11/28/2017        Relevant Orders   Comprehensive metabolic panel (Completed)   Hemoglobin A1c (Completed)    Other Visit Diagnoses    COPD exacerbation ([place])    -  Primary   Relevant Orders   DG Chest 2 View (Completed)   Breast cancer screening       Relevant Orders   MM 3D SCREEN BREAST BILATERAL      I am having Mary Howe maintain her MULTIPLE VITAMIN PO, calcium carbonate, valACYclovir, albuterol, diphenhydrAMINE, acetaminophen, benzocaine, lidocaine, guaiFENesin, OXYGEN, budesonide, alendronate, acyclovir, bisoprolol, pantoprazole, cetirizine, escitalopram, furosemide, triamcinolone cream, ALPRAZolam, and doxycycline.  No orders of the defined types were placed in this encounter.   There are no discontinued medications.  Follow-up: No follow-ups on file.   [name], MD

## 2018-05-01 NOTE — Assessment & Plan Note (Addendum)
Chest x ray done today is negative for acute changes but shows chronic bronchitic changes , bilateral LL scarring and hyperexpansion with hemidiaphragm flattening .  Will increase prednisone to 60 mg daily x 3,  Then resume taper.  Continue nebulized bronchodilators  And steroids, cough suppressants and  oral  doxycycline. Advised to rest except for hygiene and eating activities.

## 2018-05-01 NOTE — Assessment & Plan Note (Signed)
Diagnosed with fasting glucose of 125 and a1c 6.4 . A1c has  Improved with low gi diet.  no medications needed   Lab Results  Component Value Date   HGBA1C 6.1 04/30/2018   Lab Results  Component Value Date   MICROALBUR <0.7 11/28/2017

## 2018-05-06 ENCOUNTER — Other Ambulatory Visit: Payer: Self-pay | Admitting: Internal Medicine

## 2018-05-07 ENCOUNTER — Other Ambulatory Visit: Payer: Self-pay | Admitting: Internal Medicine

## 2018-05-07 MED ORDER — PREDNISONE 10 MG PO TABS: ORAL_TABLET | ORAL | 0 refills | Status: DC

## 2018-05-08 DIAGNOSIS — R0602 Shortness of breath: Secondary | ICD-10-CM | POA: Diagnosis not present

## 2018-05-08 DIAGNOSIS — J449 Chronic obstructive pulmonary disease, unspecified: Secondary | ICD-10-CM | POA: Diagnosis not present

## 2018-05-08 DIAGNOSIS — J9601 Acute respiratory failure with hypoxia: Secondary | ICD-10-CM | POA: Diagnosis not present

## 2018-05-14 ENCOUNTER — Encounter: Payer: Self-pay | Admitting: Internal Medicine

## 2018-05-24 ENCOUNTER — Other Ambulatory Visit: Payer: Self-pay | Admitting: Internal Medicine

## 2018-05-26 ENCOUNTER — Other Ambulatory Visit: Payer: Self-pay | Admitting: Internal Medicine

## 2018-05-29 ENCOUNTER — Ambulatory Visit
Admission: RE | Admit: 2018-05-29 | Discharge: 2018-05-29 | Disposition: A | Discharge status: Home / Self Care | Payer: PPO | Source: Ambulatory Visit | Attending: Internal Medicine | Admitting: Internal Medicine

## 2018-05-29 DIAGNOSIS — Z1231 Encounter for screening mammogram for malignant neoplasm of breast: Secondary | ICD-10-CM | POA: Diagnosis not present

## 2018-05-29 DIAGNOSIS — Z1239 Encounter for other screening for malignant neoplasm of breast: Secondary | ICD-10-CM

## 2018-05-31 ENCOUNTER — Ambulatory Visit: Payer: Medicare Other | Admitting: Internal Medicine

## 2018-06-07 ENCOUNTER — Ambulatory Visit (INDEPENDENT_AMBULATORY_CARE_PROVIDER_SITE_OTHER): Payer: PPO

## 2018-06-07 ENCOUNTER — Ambulatory Visit (INDEPENDENT_AMBULATORY_CARE_PROVIDER_SITE_OTHER): Payer: PPO | Admitting: Internal Medicine

## 2018-06-07 ENCOUNTER — Encounter: Payer: Self-pay | Admitting: Internal Medicine

## 2018-06-07 VITALS — BP 122/64 | HR 72 | Temp 97.6°F | Resp 16 | Ht 63.0 in | Wt 132.1 lb

## 2018-06-07 DIAGNOSIS — E1159 Type 2 diabetes mellitus with other circulatory complications: Secondary | ICD-10-CM | POA: Diagnosis not present

## 2018-06-07 DIAGNOSIS — E119 Type 2 diabetes mellitus without complications: Secondary | ICD-10-CM

## 2018-06-07 DIAGNOSIS — I10 Essential (primary) hypertension: Secondary | ICD-10-CM | POA: Diagnosis not present

## 2018-06-07 DIAGNOSIS — I152 Hypertension secondary to endocrine disorders: Secondary | ICD-10-CM | POA: Insufficient documentation

## 2018-06-07 DIAGNOSIS — Z0000 Encounter for general adult medical examination without abnormal findings: Secondary | ICD-10-CM | POA: Diagnosis not present

## 2018-06-07 DIAGNOSIS — J430 Unilateral pulmonary emphysema [MacLeod's syndrome]: Secondary | ICD-10-CM | POA: Diagnosis not present

## 2018-06-07 MED ORDER — ZOSTER VAC RECOMB ADJUVANTED 50 MCG/0.5ML IM SUSR: 0.5000 mL | Freq: Once | INTRAMUSCULAR | 1 refills | Status: AC

## 2018-06-07 MED ORDER — TETANUS-DIPHTH-ACELL PERTUSSIS 5-2.5-18.5 LF-MCG/0.5 IM SUSP: 0.5000 mL | Freq: Once | INTRAMUSCULAR | 0 refills | Status: AC

## 2018-06-07 NOTE — Progress Notes (Signed)
Subjective:  Patient ID: Mary Howe, female    DOB: [birth year]  Age: 85 y.o. MRN: [id number]  CC: Diagnoses of Hypertension associated with type 2 diabetes mellitus ([place]), Diabetes mellitus without complication ([place]), and Unilateral emphysema ([place]) were pertinent to this visit.  HPI Mary Howe presents for 3 month follow up on Type 2 DM, recent diagnosis,  Hypertension, hyperlipidemia and chronic hypoxemic respiratory failure .    Patient has no complaints today.  Patient is following a low glycemic index diet and taking all prescribed medications regularly without side effects.  Fasting sugars have been under less than 140 most of the time and post prandials have been under 160 except on rare occasions. Patient does  exercise and is not  intentionally trying to lose weight .  Patient has had an eye exam in the last 12 months and checks feet regularly for signs of infection.  Patient does not walk barefoot outside,  And denies an numbness tingling or burning in feet. Patient is up to date on all recommended vaccinations  Foot exam done  Numb under first MT head. Bilaterally  Significant bunions bilaterally cap refill sluggish  Going to  forever fit 3 times per week  Bps normal   Had cataract surgery In August    Outpatient Medications Prior to Visit  Medication Sig Dispense Refill  . acetaminophen (TYLENOL) 500 MG tablet Take 500 mg by mouth every 6 (six) hours as needed.    Marland Kitchen acyclovir (ZOVIRAX) 400 MG tablet Take 1 tablet (400 mg total) by mouth 5 (five) times daily. 35 tablet 0  . albuterol (PROVENTIL HFA;VENTOLIN HFA) 108 (90 Base) MCG/ACT inhaler Inhale 1-2 puffs into the lungs every 4 (four) hours as needed for wheezing or shortness of breath. 1 Inhaler 10  . alendronate (FOSAMAX) 70 MG tablet Take 1 tablet (70 mg total) by mouth every 7 (seven) days. Take with a full glass of water on an empty stomach. 4 tablet 11  . ALPRAZolam (XANAX) 0.25 MG tablet Take 1 tablet (0.25 mg  total) by mouth 2 (two) times daily as needed for anxiety. 20 tablet 0  . benzocaine (ORAJEL) 10 % mucosal gel Use as directed in the mouth or throat 3 (three) times daily as needed for mouth pain. 5.3 g 0  . bisoprolol (ZEBETA) 5 MG tablet TAKE 1/2 TABLET(2.5 MG) BY MOUTH DAILY 45 tablet 1  . budesonide (PULMICORT) 0.5 MG/2ML nebulizer solution USE 1 VIAL VIA NEBULIZER TWICE DAILY 120 mL 11  . calcium carbonate (OS-CAL) 600 MG TABS tablet Take 600 mg by mouth daily with breakfast.     . cetirizine (ZYRTEC) 10 MG tablet Take 1 tablet (10 mg total) by mouth daily. 90 tablet 1  . diphenhydrAMINE (BENADRYL) 25 MG tablet Take 25 mg by mouth at bedtime as needed.    Marland Kitchen escitalopram (LEXAPRO) 10 MG tablet TAKE 1 TABLET(10 MG) BY MOUTH DAILY 90 tablet 0  . furosemide (LASIX) 20 MG tablet Take 1 tablet (20 mg total) by mouth 2 (two) times daily. 180 tablet 1  . guaiFENesin (ROBITUSSIN) 100 MG/5ML SOLN Take 10 mLs (200 mg total) by mouth every 4 (four) hours as needed for cough or to loosen phlegm. 1200 mL 0  . lidocaine (LIDODERM) 5 % Place 1 patch onto the skin daily. Remove & Discard patch within 12 hours or as directed by MD 30 patch 0  . MULTIPLE VITAMIN PO Take 1 tablet by mouth daily.     . OXYGEN Inhale  2.5 L daily into the lungs.    . pantoprazole (PROTONIX) 40 MG tablet TAKE 1 TABLET(40 MG) BY MOUTH DAILY 90 tablet 1  . triamcinolone cream (KENALOG) 0.1 % Apply 1 application topically 2 (two) times daily. 30 g 0  . valACYclovir (VALTREX) 1000 MG tablet Take 1,000 mg by mouth 2 (two) times daily.     . predniSONE (DELTASONE) 10 MG tablet 6 tablets on Day 1 , then reduce by 1 tablet daily until gone (Patient not taking: Reported on 06/07/2018) 21 tablet 0   No facility-administered medications prior to visit.     Review of Systems;  Patient denies headache, fevers, malaise, unintentional weight loss, skin rash, eye pain, sinus congestion and sinus pain, sore throat, dysphagia,  hemoptysis ,  cough, dyspnea, wheezing, chest pain, palpitations, orthopnea, edema, abdominal pain, nausea, melena, diarrhea, constipation, flank pain, dysuria, hematuria, urinary  Frequency, nocturia, numbness, tingling, seizures,  Focal weakness, Loss of consciousness,  Tremor, insomnia, depression, anxiety, and suicidal ideation.      Objective:  BP 122/64 (BP Location: Left Arm, Patient Position: Sitting, Cuff Size: Normal)   Pulse 72   Temp 97.6 F (36.4 C) (Oral)   Resp 16   Ht 5\' 3"  (1.6 m)   Wt 132 lb (59.9 kg)   SpO2 95%   BMI 23.38 kg/m   BP Readings from Last 3 Encounters:  06/07/18 122/64  06/07/18 122/64  04/30/18 138/70    Wt Readings from Last 3 Encounters:  06/07/18 132 lb (59.9 kg)  06/07/18 132 lb 1.9 oz (59.9 kg)  04/30/18 132 lb (59.9 kg)    General appearance: alert, cooperative and appears stated age Ears: normal TM's and external ear canals both ears Throat: lips, mucosa, and tongue normal; teeth and gums normal Neck: no adenopathy, no carotid bruit, supple, symmetrical, trachea midline and thyroid not enlarged, symmetric, no tenderness/mass/nodules Back: symmetric, no curvature. ROM normal. No CVA tenderness. Lungs: clear to auscultation bilaterally Heart: regular rate and rhythm, S1, S2 normal, no murmur, click, rub or gallop Abdomen: soft, non-tender; bowel sounds normal; no masses,  no organomegaly Pulses: 2+ and symmetric Skin: Skin color, texture, turgor normal. No rashes or lesions Lymph nodes: Cervical, supraclavicular, and axillary nodes normal.  Lab Results  Component Value Date   HGBA1C 6.1 04/30/2018   HGBA1C 6.0 11/28/2017   HGBA1C 5.8 (H) 05/22/2017    Lab Results  Component Value Date   CREATININE 0.66 04/30/2018   CREATININE 0.67 11/28/2017   CREATININE 0.74 05/30/2017    Lab Results  Component Value Date   WBC 11.1 (H) 01/16/2017   HGB 14.8 01/16/2017   HCT 43.0 01/16/2017   PLT 282 01/16/2017   GLUCOSE 97 04/30/2018   CHOL 208  (H) 11/28/2017   TRIG 136.0 11/28/2017   HDL 90.40 11/28/2017   LDLDIRECT 89.0 07/01/2016   LDLCALC 90 11/28/2017   ALT 16 04/30/2018   AST 21 04/30/2018   NA 135 04/30/2018   K 4.0 04/30/2018   CL 96 04/30/2018   CREATININE 0.66 04/30/2018   BUN 14 04/30/2018   CO2 29 04/30/2018   TSH 3.97 11/28/2017   INR 1.09 01/16/2017   HGBA1C 6.1 04/30/2018   MICROALBUR <0.7 11/28/2017    Mm 3d Screen Breast Bilateral  Result Date: 05/29/2018 CLINICAL DATA:  Screening. EXAM: DIGITAL SCREENING BILATERAL MAMMOGRAM WITH TOMO AND CAD COMPARISON:  Previous exam(s). ACR Breast Density Category b: There are scattered areas of fibroglandular density. FINDINGS: There are no findings suspicious for  malignancy. Images were processed with CAD. IMPRESSION: No mammographic evidence of malignancy. A result letter of this screening mammogram will be mailed directly to the patient. RECOMMENDATION: Screening mammogram in one year. (Code:SM-B-01Y) BI-RADS CATEGORY  1: Negative. Electronically Signed   By: Curlene Dolphin M.D.   On: 05/29/2018 16:36    Assessment & Plan:   Problem List Items Addressed This Visit    Hypertension associated with type 2 diabetes mellitus ([place])    Well controlled on current regimen. Renal function stable, no changes today.  Lab Results  Component Value Date   CREATININE 0.66 04/30/2018   Lab Results  Component Value Date   NA 135 04/30/2018   K 4.0 04/30/2018   CL 96 04/30/2018   CO2 29 04/30/2018         Relevant Orders   Hemoglobin A1c   Comprehensive metabolic panel   Lipid panel   Microalbumin / creatinine urine ratio   Diabetes mellitus without complication ([place])    Diagnosed with fasting glucose of 125 and a1c 6.4 . A1c has  Improved with low gi diet.  no medications needed   Lab Results  Component Value Date   HGBA1C 6.1 04/30/2018   Lab Results  Component Value Date   MICROALBUR <0.7 11/28/2017        COPD (chronic obstructive pulmonary disease)  (H[facility])    secondary to advance panlobular emphysema Fev/FVC of 60%.  She has improved her exerise tolerance and is now 02 dependent only at night.  Sleep study done Feb 2019 indicates that 74% of her time asleep is spent with an 02 sat < 90%  She is using nocturnal oxygen  And has increased use of Pulmicort to twice daily          I am having Mary Howe start on Zoster Vaccine Adjuvanted and Tdap. I am also having her maintain her MULTIPLE VITAMIN PO, calcium carbonate, valACYclovir, albuterol, diphenhydrAMINE, acetaminophen, benzocaine, lidocaine, guaiFENesin, OXYGEN, alendronate, acyclovir, bisoprolol, pantoprazole, cetirizine, furosemide, triamcinolone cream, ALPRAZolam, budesonide, and escitalopram.  Meds ordered this encounter  Medications  . Zoster Vaccine Adjuvanted [facility]) injection    Sig: Inject 0.5 mLs into the muscle once for 1 dose.    Dispense:  1 each    Refill:  1  . Tdap (BOOSTRIX) 5-2.5-18.5 LF-MCG/0.5 injection    Sig: Inject 0.5 mLs into the muscle once for 1 dose.    Dispense:  0.5 mL    Refill:  0    There are no discontinued medications.  Follow-up: Return in about 5 months (around 11/05/2018) for follow up diabetes.   [name], MD

## 2018-06-07 NOTE — Patient Instructions (Addendum)
  [name] , Thank you for taking time to come for your Medicare Wellness Visit. I appreciate your ongoing commitment to your health goals. Please review the following plan we discussed and let me know if I can assist you in the future.   These are the goals we discussed: Goals      Patient Stated   . DIET - INCREASE WATER INTAKE (pt-stated)     Stay hydrated       This is a list of the screening recommended for you and due dates:  Health Maintenance  Topic Date Due  . Complete foot exam   09/08/1944  . Tetanus Vaccine  09/08/1953  . Eye exam for diabetics  11/09/2016  . Hemoglobin A1C  10/29/2018  . Urine Protein Check  11/29/2018  . Mammogram  05/30/2019  . Flu Shot  Completed  . DEXA scan (bone density measurement)  Completed  . Pneumonia vaccines  Completed

## 2018-06-07 NOTE — Progress Notes (Signed)
Subjective:   Mary Howe is a 85 y.o. female who presents for Medicare Annual (Subsequent) preventive examination.  Review of Systems:  No ROS.  Medicare Wellness Visit. Additional risk factors are reflected in the social history. Cardiac Risk Factors include: advanced age (>39men, >35 women);diabetes mellitus;hypertension     Objective:     Vitals: BP 122/64 (BP Location: Left Arm, Patient Position: Sitting, Cuff Size: Normal)   Pulse 72   Temp 97.6 F (36.4 C) (Oral)   Resp 16   Ht 5\' 3"  (1.6 m)   Wt 132 lb 1.9 oz (59.9 kg)   SpO2 95%   BMI 23.40 kg/m   Body mass index is 23.4 kg/m.  Advanced Directives 06/07/2018 11/15/2017 10/10/2017 04/18/2017 01/12/2017 09/20/2016 06/10/2016  Does Patient Have a Medical Advance Directive? Yes Yes Yes Yes No Yes No  Type of Advance Directive Living will;Healthcare Power of [place];Living will Healthcare Power of [place];Living will - [place];Living will -  Does patient want to make changes to medical advance directive? No - Patient declined No - Patient declined No - Patient declined No - Patient declined - No - Patient declined -  Copy of [place] in Chart? No - copy requested No - copy requested Yes - - No - copy requested -  Would patient like information on creating a medical advance directive? - - - - No - Patient declined - No - Patient declined    Tobacco Social History   Tobacco Use  Smoking Status Former Smoker  . Packs/day: 1.50  . Years: 29.00  . Pack years: 43.50  . Types: Cigarettes  . Last attempt to quit: 04/11/1988  . Years since quitting: 30.1  Smokeless Tobacco Never Used  Tobacco Comment   quit smoking in 1990     Counseling given: Not Answered Comment: quit smoking in 1990   Clinical Intake:  Pre-visit preparation completed: Yes        Diabetes: Yes(Followed by pcp)  How often do you need to have someone  help you when you read instructions, pamphlets, or other written materials from your doctor or pharmacy?: 1 - Never  Interpreter Needed?: No     Past Medical History:  Diagnosis Date  . CHF (congestive heart failure) ([place])   . COPD (chronic obstructive pulmonary disease) (H[facility])    emphysema  . Dental bridge present    permanent, lower right  . Diverticulitis 2015  . Essential hypertension   . GERD (gastroesophageal reflux disease)   . Hyperlipidemia   . PAH (pulmonary artery hypertension) ([place])    a. 01/2017 Echo: PASP 86mmHg; b. 01/2017 RHC: mild PAH.  . Takotsubo cardiomyopathy    a. 01/2017 Echo: EF 25-30%, sev glob HK w/ basal regions moving well. Gr1 DD. mild MR, mildly dil LA, nl RV fxn, mild to mod TR, PASP 49mmHg; b. 01/2017 Cath: Nl cors w/ EF 35-45%. HK of distal segments suggestive of Takotsubo CM.   Marland Kitchen Thyroid disease   . Wears hearing aid in both ears    Past Surgical History:  Procedure Laterality Date  . CATARACT EXTRACTION W/PHACO Right 10/10/2017   Procedure: CATARACT EXTRACTION PHACO AND INTRAOCULAR LENS PLACEMENT ([place])  RIGHT;  Surgeon: Leandrew Koyanagi, MD;  Location: [place];  Service: Ophthalmology;  Laterality: Right;  . CATARACT EXTRACTION W/PHACO Left 11/15/2017   Procedure: CATARACT EXTRACTION PHACO AND INTRAOCULAR LENS PLACEMENT ([place])  LEFT;  Surgeon: Leandrew Koyanagi,  MD;  Location: [facility];  Service: Ophthalmology;  Laterality: Left;  . CERVICAL CONE BIOPSY    . CHOLECYSTECTOMY    . HEMORROIDECTOMY    . RIGHT/LEFT HEART CATH AND CORONARY ANGIOGRAPHY N/A 01/16/2017   Procedure: RIGHT/LEFT HEART CATH AND CORONARY ANGIOGRAPHY;  Surgeon: Wellington Hampshire, MD;  Location: [facility];  Service: Cardiovascular;  Laterality: N/A;   Family History  Problem Relation Age of Onset  . Prostate cancer Father   . Pneumonia Brother   . Lung disease Brother   . CAD Brother   . Heart disease Brother   . CAD Brother   . Heart  attack Brother   . Brain cancer Brother   . Bone cancer Sister   . Emphysema Sister   . Fibrocystic breast disease Sister   . Aneurysm Sister   . Heart disease Sister   . Breast cancer Neg Hx    Social History   Socioeconomic History  . Marital status: Divorced    Spouse name: Not on file  . Number of children: Not on file  . Years of education: Not on file  . Highest education level: Not on file  Occupational History  . Occupation: retired  Scientific laboratory technician  . Financial resource strain: Not hard at all  . Food insecurity:    Worry: Never true    Inability: Never true  . Transportation needs:    Medical: No    Non-medical: No  Tobacco Use  . Smoking status: Former Smoker    Packs/day: 1.50    Years: 29.00    Pack years: 43.50    Types: Cigarettes    Last attempt to quit: 04/11/1988    Years since quitting: 30.1  . Smokeless tobacco: Never Used  . Tobacco comment: quit smoking in 1990  Substance and Sexual Activity  . Alcohol use: Yes    Alcohol/week: 7.0 standard drinks    Types: 7 Glasses of wine per week    Comment:    . Drug use: No  . Sexual activity: Never  Lifestyle  . Physical activity:    Days per week: 3 days    Minutes per session: 90 min  . Stress: Not at all  Relationships  . Social connections:    Talks on phone: Not on file    Gets together: Not on file    Attends religious service: Not on file    Active member of club or organization: Not on file    Attends meetings of clubs or organizations: Not on file    Relationship status: Not on file  Other Topics Concern  . Not on file  Social History Narrative   Moved from [place] to [place] in 2014, where she lives with her 46 y/o sister.  Dtr and family nearby.        Outpatient Encounter Medications as of 06/07/2018  Medication Sig  . acetaminophen (TYLENOL) 500 MG tablet Take 500 mg by mouth every 6 (six) hours as needed.  Marland Kitchen acyclovir (ZOVIRAX) 400 MG tablet Take 1 tablet (400 mg total) by mouth 5  (five) times daily.  Marland Kitchen albuterol (PROVENTIL HFA;VENTOLIN HFA) 108 (90 Base) MCG/ACT inhaler Inhale 1-2 puffs into the lungs every 4 (four) hours as needed for wheezing or shortness of breath.  Marland Kitchen alendronate (FOSAMAX) 70 MG tablet Take 1 tablet (70 mg total) by mouth every 7 (seven) days. Take with a full glass of water on an empty stomach.  . ALPRAZolam (XANAX) 0.25 MG  tablet Take 1 tablet (0.25 mg total) by mouth 2 (two) times daily as needed for anxiety.  . benzocaine (ORAJEL) 10 % mucosal gel Use as directed in the mouth or throat 3 (three) times daily as needed for mouth pain.  . bisoprolol (ZEBETA) 5 MG tablet TAKE 1/2 TABLET(2.5 MG) BY MOUTH DAILY  . budesonide (PULMICORT) 0.5 MG/2ML nebulizer solution USE 1 VIAL VIA NEBULIZER TWICE DAILY  . calcium carbonate (OS-CAL) 600 MG TABS tablet Take 600 mg by mouth daily with breakfast.   . cetirizine (ZYRTEC) 10 MG tablet Take 1 tablet (10 mg total) by mouth daily.  . diphenhydrAMINE (BENADRYL) 25 MG tablet Take 25 mg by mouth at bedtime as needed.  Marland Kitchen escitalopram (LEXAPRO) 10 MG tablet TAKE 1 TABLET(10 MG) BY MOUTH DAILY  . furosemide (LASIX) 20 MG tablet Take 1 tablet (20 mg total) by mouth 2 (two) times daily.  Marland Kitchen guaiFENesin (ROBITUSSIN) 100 MG/5ML SOLN Take 10 mLs (200 mg total) by mouth every 4 (four) hours as needed for cough or to loosen phlegm.  . lidocaine (LIDODERM) 5 % Place 1 patch onto the skin daily. Remove & Discard patch within 12 hours or as directed by MD  . MULTIPLE VITAMIN PO Take 1 tablet by mouth daily.   . OXYGEN Inhale 2.5 L daily into the lungs.  . pantoprazole (PROTONIX) 40 MG tablet TAKE 1 TABLET(40 MG) BY MOUTH DAILY  . triamcinolone cream (KENALOG) 0.1 % Apply 1 application topically 2 (two) times daily.  . valACYclovir (VALTREX) 1000 MG tablet Take 1,000 mg by mouth 2 (two) times daily.   . [DISCONTINUED] predniSONE (DELTASONE) 10 MG tablet 6 tablets on Day 1 , then reduce by 1 tablet daily until gone (Patient not  taking: Reported on 06/07/2018)   No facility-administered encounter medications on file as of 06/07/2018.     Activities of Daily Living In your present state of health, do you have any difficulty performing the following activities: 06/07/2018 11/15/2017  Hearing? Y N  Vision? N N  Difficulty concentrating or making decisions? N N  Walking or climbing stairs? Y N  Comment COPD. Paces herself.  -  Dressing or bathing? N N  Doing errands, shopping? N -  Preparing Food and eating ? N -  Using the Toilet? N -  In the past six months, have you accidently leaked urine? N -  Do you have problems with loss of bowel control? N -  Managing your Medications? N -  Managing your Finances? N -  Housekeeping or managing your Housekeeping? N -  Some recent data might be hidden    Patient Care Team: [name], MD as PCP - General (Internal Medicine)    Assessment:   This is a routine wellness examination for [place].  Oxygen in use as directed at bedtime and daily PRN.   Health Screenings  Mammogram -05/29/18 Colonoscopy -02/11/11 Bone Density -02/23/16 Glaucoma -none Hearing -hearing aids Hemoglobin A1C -04/30/18 (6.1) Cholesterol -11/28/17 (208) Dental-every 6 months  Social  Alcohol intake -yes Smoking history- forever Smokers in home? none Illicit drug use? none Exercise -walking, treadmill, elliptical 3 days weekly, 90 minutes Diet -regular Sexually Active- never  Safety  Patient feels safe at home. Her 4 year old sister shares the home with her.  Patient does have smoke detectors at home  Patient does wear sunscreen or protective clothing when in direct sunlight  Patient does wear seat belt when driving or riding with others.   Activities of  Daily Living Patient can do their own household chores. Denies needing assistance with: driving, feeding themselves, getting from bed to chair, getting to the toilet, bathing/showering, dressing, managing money, or preparing meals.  Paces herself when climbing stairs.   Depression Screen Patient denies losing interest in daily life, feeling hopeless, or crying easily over simple problems.   Fall Screen Patient denies being afraid of falling or falling in the last year.   Memory Screen Patient denies problems with memory, misplacing items, and is able to balance checkbook/bank accounts.  Patient is alert, normal appearance, oriented to person/place/and time. Correctly identified the president of the [place], recall of 2/3 objects, and performing simple calculations.  Patient displays appropriate judgement and can read correct time from watch face.   Immunizations The following Immunizations are up to date: Influenza, shingles, pneumonia, and tetanus.   Other Providers Patient Care Team: [name], MD as PCP - General (Internal Medicine)  Exercise Activities and Dietary recommendations Current Exercise Habits: Home exercise routine, Type of exercise: walking;treadmill(elliptical), Time (Minutes): > 60(38min), Frequency (Times/Week): 3, Weekly Exercise (Minutes/Week): 0, Intensity: Moderate  Goals      Patient Stated   . DIET - INCREASE WATER INTAKE (pt-stated)     Stay hydrated       Fall Risk Fall Risk  06/07/2018 04/18/2017 09/20/2016 06/22/2016 06/10/2016  Falls in the past year? 0 No No Yes No  Number falls in past yr: - - - 1 -  Injury with Fall? - - - Yes -  Risk Factor Category  - - - High Fall Risk -  Risk for fall due to : - Impaired vision - - -  Risk for fall due to: Comment - wears glasses - - -   Depression Screen PHQ 2/9 Scores 06/07/2018 07/10/2017 05/29/2017 04/18/2017  PHQ - 2 Score 0 0 0 0  PHQ- 9 Score - 0 0 0     Cognitive Function MMSE - Mini Mental State Exam 09/20/2016  Orientation to time 5  Orientation to Place 5  Registration 3  Attention/ Calculation 5  Recall 3  Language- name 2 objects 2  Language- repeat 1  Language- follow 3 step command 3  Language- read & follow  direction 1  Write a sentence 1  Copy design 1  Total score 30     6CIT Screen 06/07/2018  What Year? 0 points  What month? 0 points  What time? 0 points  Count back from 20 0 points  Months in reverse 0 points  Repeat phrase 0 points  Total Score 0    Immunization History  Administered Date(s) Administered  . Influenza, High Dose Seasonal PF 02/23/2015, 01/18/2016, 12/14/2016, 01/18/2018  . Pneumococcal Conjugate-13 10/21/2013  . Pneumococcal Polysaccharide-23 05/04/2016  . Zoster 01/30/2012   Screening Tests Health Maintenance  Topic Date Due  . FOOT EXAM  09[birth year]1944  . TETANUS/TDAP  09/08/1953  . OPHTHALMOLOGY EXAM  11/09/2016  . HEMOGLOBIN A1C  10/29/2018  . URINE MICROALBUMIN  11/29/2018  . MAMMOGRAM  05/30/2019  . INFLUENZA VACCINE  Completed  . DEXA SCAN  Completed  . PNA vac Low Risk Adult  Completed       Plan:    End of life planning; Advance aging; Advanced directives discussed. Copy of current HCPOA/Living Will requested.    I have personally reviewed and noted the following in the patient's chart:   . Medical and social history . Use of alcohol, tobacco or illicit drugs  . Current medications  and supplements . Functional ability and status . Nutritional status . Physical activity . Advanced directives . List of other physicians . Hospitalizations, surgeries, and ER visits in previous 12 months . Vitals . Screenings to include cognitive, depression, and falls . Referrals and appointments  In addition, I have reviewed and discussed with patient certain preventive protocols, quality metrics, and best practice recommendations. A written personalized care plan for preventive services as well as general preventive health recommendations were provided to patient.     OBrien-Blaney, Taige Housman L, LPN  12/04/7491   I have reviewed the above information and agree with above.   Deborra Medina, MD

## 2018-06-07 NOTE — Patient Instructions (Signed)
You have numbness on both foot under the great toe.  This may be early diabetic neuropathy , or it may be compression neuropathy from your bunions.  No workup is needed;  Just protect your feet from callouses,  Pressure ulcers  And fungus (avoid pedicures unless you provide your own tools)  Ask Dr Mordecai Rasmussen to send me proof of your diabetic eye exam  Follow up in July with fasting labs prior to visit

## 2018-06-08 DIAGNOSIS — J9601 Acute respiratory failure with hypoxia: Secondary | ICD-10-CM | POA: Diagnosis not present

## 2018-06-08 DIAGNOSIS — J449 Chronic obstructive pulmonary disease, unspecified: Secondary | ICD-10-CM | POA: Diagnosis not present

## 2018-06-08 DIAGNOSIS — R0602 Shortness of breath: Secondary | ICD-10-CM | POA: Diagnosis not present

## 2018-06-09 NOTE — Assessment & Plan Note (Signed)
Well controlled on current regimen. Renal function stable, no changes today.  Lab Results  Component Value Date   CREATININE 0.66 04/30/2018   Lab Results  Component Value Date   NA 135 04/30/2018   K 4.0 04/30/2018   CL 96 04/30/2018   CO2 29 04/30/2018

## 2018-06-09 NOTE — Assessment & Plan Note (Signed)
Diagnosed with fasting glucose of 125 and a1c 6.4 . A1c has  Improved with low gi diet.  no medications needed   Lab Results  Component Value Date   HGBA1C 6.1 04/30/2018   Lab Results  Component Value Date   MICROALBUR <0.7 11/28/2017

## 2018-06-09 NOTE — Assessment & Plan Note (Signed)
secondary to advance panlobular emphysema Fev/FVC of 60%.  She has improved her exerise tolerance and is now 02 dependent only at night.  Sleep study done Feb 2019 indicates that 74% of her time asleep is spent with an 02 sat < 90%  She is using nocturnal oxygen  And has increased use of Pulmicort to twice daily  

## 2018-06-12 ENCOUNTER — Encounter: Payer: Self-pay | Admitting: *Deleted

## 2018-06-17 NOTE — Progress Notes (Signed)
Cardiology Office Note  Date:  06/18/2018   ID:  Mary Howe, DOB 02-13-1933, MRN [id number]  PCP:  Mary Mc, MD   Chief Complaint  Patient presents with  . Other    12 month follow up. Patient denies chest pain and SOB at this time. Patient states she does sleep with Oxygen. Meds reviewed verbally with patient.     HPI:  85 y.o. female with a history of  COPD,  hypertension,  hyperlipidemia,  GERD,  mild pulmonary hypertension,  diagnosis of Takotsubo cardiomyopathy  who presents for follow-up of her cardiomyopathy  INTERVAL HISTORY: The patient reports today for follow up. She is doing well today. She is accompanied by her daughter today. She has not had any issues in the past year since the last visit. She has trace edema in her legs, but it is probably due to a venous issue and not heart related. She has been exercising regularly at [employer].  She is precautious whenever she has a cough and contacts her PCP immediately to prevent any hospitalizations. Usually she is given prednisone for prevention.  The furosemide has been helping with excessive fluid.  She is watching her diet which has helped keep her glucose stable. She has held back on drinking wine.  Reports she still has stress. Caused by family issues with her 88 y/o sister she lives with.   Blood pressure 130/66 Total Chol 208/ LDL 90 HBA1C 6.1 CR 0.66 Glucose 97   EKG personally reviewed by myself on todays visit Shows normal sinus rhythm. 71 bpm. Consider inferior infarct, age undetermined.   OTHER PAST MEDICAL HISTORY REVIEWED BY ME FOR TODAY'S VISIT: Lots of stress, takes care of sister who is 62 Doing pulmonary rehab, exercising well, feels good  Follow-up echocardiogram 03/16/2017 with ejection fraction 55-60%. Using oxygen at nighttime alone  Echocardiogram on 01/12/2017 showed LV dysfunction with an EF of 25-30% with severe global hypokinesis. Troponin 0.54.   Diagnostic  catheterization on 01/16/2017 revealed normal coronary arteries, normal right heart filling pressures, and mild pulmonary hypertension.  EF on ventriculography has improved some to 35-45%.   October 2018 admitted with acute on chronic hypoxic and hypercapnic respiratory failure in the setting of atypical pulmonary infection and AE COPD. Acute encephalopathy and sepsis with hypotension requiring vasopressors. She was discharged on low-dose bisoprolol and losartan. The losartan was discontinued in the setting of soft blood pressures.  PMH:   has a past medical history of CHF (congestive heart failure) ([place]), COPD (chronic obstructive pulmonary disease) ([place]), Dental bridge present, Diverticulitis (2015), Essential hypertension, GERD (gastroesophageal reflux disease), Hyperlipidemia, PAH (pulmonary artery hypertension) ([place]), Takotsubo cardiomyopathy, Thyroid disease, and Wears hearing aid in both ears.  PSH:    Past Surgical History:  Procedure Laterality Date  . CATARACT EXTRACTION W/PHACO Right 10/10/2017   Procedure: CATARACT EXTRACTION PHACO AND INTRAOCULAR LENS PLACEMENT ([place])  RIGHT;  Surgeon: Mary Koyanagi, MD;  Location: [facility];  Service: Ophthalmology;  Laterality: Right;  . CATARACT EXTRACTION W/PHACO Left 11/15/2017   Procedure: CATARACT EXTRACTION PHACO AND INTRAOCULAR LENS PLACEMENT ([place])  LEFT;  Surgeon: Mary Koyanagi, MD;  Location: [facility];  Service: Ophthalmology;  Laterality: Left;  . CERVICAL CONE BIOPSY    . CHOLECYSTECTOMY    . HEMORROIDECTOMY    . RIGHT/LEFT HEART CATH AND CORONARY ANGIOGRAPHY N/A 01/16/2017   Procedure: RIGHT/LEFT HEART CATH AND CORONARY ANGIOGRAPHY;  Surgeon: Wellington Hampshire, MD;  Location: [facility];  Service: Cardiovascular;  Laterality: N/A;    Current Outpatient Medications  Medication Sig Dispense Refill  . acetaminophen (TYLENOL) 500 MG tablet Take 500 mg by mouth every 6 (six) hours as needed.    Marland Kitchen  acyclovir (ZOVIRAX) 400 MG tablet Take 1 tablet (400 mg total) by mouth 5 (five) times daily. 35 tablet 0  . albuterol (PROVENTIL HFA;VENTOLIN HFA) 108 (90 Base) MCG/ACT inhaler Inhale 1-2 puffs into the lungs every 4 (four) hours as needed for wheezing or shortness of breath. 1 Inhaler 10  . alendronate (FOSAMAX) 70 MG tablet Take 1 tablet (70 mg total) by mouth every 7 (seven) days. Take with a full glass of water on an empty stomach. 4 tablet 11  . ALPRAZolam (XANAX) 0.25 MG tablet Take 1 tablet (0.25 mg total) by mouth 2 (two) times daily as needed for anxiety. 20 tablet 0  . bisoprolol (ZEBETA) 5 MG tablet TAKE 1/2 TABLET(2.5 MG) BY MOUTH DAILY 45 tablet 1  . budesonide (PULMICORT) 0.5 MG/2ML nebulizer solution USE 1 VIAL VIA NEBULIZER TWICE DAILY 120 mL 11  . calcium carbonate (OS-CAL) 600 MG TABS tablet Take 600 mg by mouth daily with breakfast.     . cetirizine (ZYRTEC) 10 MG tablet Take 1 tablet (10 mg total) by mouth daily. 90 tablet 1  . diphenhydrAMINE (BENADRYL) 25 MG tablet Take 25 mg by mouth at bedtime as needed.    Marland Kitchen escitalopram (LEXAPRO) 10 MG tablet TAKE 1 TABLET(10 MG) BY MOUTH DAILY 90 tablet 0  . furosemide (LASIX) 20 MG tablet Take 1 tablet (20 mg total) by mouth 2 (two) times daily. 180 tablet 1  . MULTIPLE VITAMIN PO Take 1 tablet by mouth daily.     . OXYGEN Inhale 2.5 L daily into the lungs.    . pantoprazole (PROTONIX) 40 MG tablet TAKE 1 TABLET(40 MG) BY MOUTH DAILY 90 tablet 1  . triamcinolone cream (KENALOG) 0.1 % Apply 1 application topically 2 (two) times daily. 30 g 0  . valACYclovir (VALTREX) 1000 MG tablet Take 1,000 mg by mouth 2 (two) times daily.      No current facility-administered medications for this visit.      Allergies:   Simvastatin   Social History:  The patient  reports that she quit smoking about 30 years ago. Her smoking use included cigarettes. She has a 43.50 pack-year smoking history. She has never used smokeless tobacco. She reports  current alcohol use of about 7.0 standard drinks of alcohol per week. She reports that she does not use drugs.   Family History:   family history includes Aneurysm in her sister; Bone cancer in her sister; Brain cancer in her brother; CAD in her brother and brother; Emphysema in her sister; Fibrocystic breast disease in her sister; Heart attack in her brother; Heart disease in her brother and sister; Lung disease in her brother; Pneumonia in her brother; Prostate cancer in her father.    Review of Systems: Review of Systems  Constitutional: Negative.   Eyes: Negative.   Respiratory: Negative.   Cardiovascular: Positive for leg swelling.  Gastrointestinal: Negative.   Genitourinary: Negative.   Musculoskeletal: Negative.   Neurological: Negative.   Psychiatric/Behavioral: Negative.   All other systems reviewed and are negative.    PHYSICAL EXAM: VS:  BP 130/66 (BP Location: Left Arm, Patient Position: Sitting, Cuff Size: Normal)   Pulse 71   Ht 5\' 3"  (1.6 m)   Wt 131 lb (59.4 kg)   BMI 23.21 kg/m  , BMI Body  mass index is 23.21 kg/m.  Constitutional:  oriented to person, place, and time. No distress.  HENT:  Head: Grossly normal Eyes:  no discharge. No scleral icterus.  Neck: No JVD, no carotid bruits  Cardiovascular: Regular rate and rhythm, no murmurs appreciated  Pulmonary/Chest: Clear to auscultation bilaterally, no wheezes or rales Abdominal: Soft.  no distension.  no tenderness.  Musculoskeletal: Normal range of motion Neurological:  normal muscle tone. Coordination normal. No atrophy Skin: Skin warm and dry Psychiatric: normal affect, pleasant   Recent Labs: 11/28/2017: TSH 3.97 04/30/2018: ALT 16; BUN 14; Creatinine, Ser 0.66; Potassium 4.0; Sodium 135    Lipid Panel Lab Results  Component Value Date   CHOL 208 (H) 11/28/2017   HDL 90.40 11/28/2017   LDLCALC 90 11/28/2017   TRIG 136.0 11/28/2017      Wt Readings from Last 3 Encounters:  06/18/18 131 lb  (59.4 kg)  06/07/18 132 lb (59.9 kg)  06/07/18 132 lb 1.9 oz (59.9 kg)       ASSESSMENT AND PLAN:  NICM (nonischemic cardiomyopathy) ([place])  Plan: EKG 12-Lead Previously felt secondary to secondary to stressors, pneumonia, COPD etc. In late 2018 On very low-dose bisoprolol and Lasix No medication changes made Typically takes Lasix daily, rarely twice a day Weight stable  Essential hypertension Plan:  Blood pressure is well controlled on today's visit. No changes made to the medications. Stable  Centrilobular emphysema (HCC) Plan:  Stable respiratory status Uses oxygen at nighttime alone She is aggressive with any bronchitis, gets on medication early  Lower extremity edema Plan: Component of venous insufficiency Stable features, on Lasix daily rarely twice a day  Hypercholesterolemia Plan: cardiac catheterization with no significant disease Currently not on a statin Was previously on simvastatin  Diabetes type 2 Plan: Exercising on a regular basis, watching her diet  Disposition:   F/U 12  Months   Total encounter time more than 25 minutes  Greater than 50% was spent in counseling and coordination of care with the patient    Orders Placed This Encounter  Procedures  . EKG 12-Lead   I, Jesus Reyes am acting as a scribe for Ida Rogue, M.D., Ph.D.  I, Ida Rogue, M.D. Ph.D., have reviewed the above documentation for accuracy and completeness, and I agree with the above.    Signed, Esmond Plants, M.D., Ph.D. 06/18/2018  [place], [place]

## 2018-06-18 ENCOUNTER — Encounter: Payer: Self-pay | Admitting: Cardiovascular Disease

## 2018-06-18 ENCOUNTER — Ambulatory Visit: Payer: PPO | Admitting: Cardiovascular Disease

## 2018-06-18 VITALS — BP 130/66 | HR 71 | Ht 63.0 in | Wt 131.0 lb

## 2018-06-18 DIAGNOSIS — I428 Other cardiomyopathies: Secondary | ICD-10-CM | POA: Diagnosis not present

## 2018-06-18 NOTE — Patient Instructions (Addendum)
Medication Instructions:  No changes  If you need a refill on your cardiac medications before your next appointment, please call your pharmacy.    Lab work: No new labs needed   If you have labs (blood work) drawn today and your tests are completely normal, you will receive your results only by: Marland Kitchen MyChart Message (if you have MyChart) OR . A paper copy in the mail If you have any lab test that is abnormal or we need to change your treatment, we will call you to review the results.   Testing/Procedures: No new testing needed   Follow-Up: At [facility], you and your health needs are our priority.  As part of our continuing mission to provide you with exceptional heart care, we have created designated Provider Care Teams.  These Care Teams include your primary Cardiologist (physician) and Advanced Practice Providers (APPs -  Physician Assistants and Nurse Practitioners) who all work together to provide you with the care you need, when you need it.  . You will need a follow up appointment in 12 months .   Please call our office 2 months in advance to schedule this appointment.    . Providers on your designated Care Team:   . Murray Hodgkins, NP . Christell Faith, PA-C . Marrianne Mood, PA-C  Any Other Special Instructions Will Be Listed Below (If Applicable).  For educational health videos Log in to : [email or web address] Or : [email or web address], password : [email or web address]  Home

## 2018-06-21 DIAGNOSIS — H353131 Nonexudative age-related macular degeneration, bilateral, early dry stage: Secondary | ICD-10-CM | POA: Diagnosis not present

## 2018-06-21 LAB — HM DIABETES EYE EXAM

## 2018-06-22 ENCOUNTER — Other Ambulatory Visit: Payer: Self-pay | Admitting: Internal Medicine

## 2018-06-22 DIAGNOSIS — K219 Gastro-esophageal reflux disease without esophagitis: Secondary | ICD-10-CM

## 2018-08-02 DIAGNOSIS — J9601 Acute respiratory failure with hypoxia: Secondary | ICD-10-CM | POA: Diagnosis not present

## 2018-08-02 DIAGNOSIS — R062 Wheezing: Secondary | ICD-10-CM | POA: Diagnosis not present

## 2018-08-02 DIAGNOSIS — J449 Chronic obstructive pulmonary disease, unspecified: Secondary | ICD-10-CM | POA: Diagnosis not present

## 2018-08-13 ENCOUNTER — Other Ambulatory Visit: Payer: Self-pay

## 2018-08-13 MED ORDER — ESCITALOPRAM OXALATE 10 MG PO TABS: 10.0000 mg | ORAL_TABLET | Freq: Every day | ORAL | 0 refills | Status: DC

## 2018-08-28 IMAGING — CT CT T SPINE W/O CM
2 of 3 series · 13 of 33 positions shown, 16 images · non-contrast
Comparison: None.

CLINICAL DATA: History of compression fractures. Back pain since
[REDACTED]. No known trauma.

EXAM:
CT THORACIC SPINE WITHOUT CONTRAST
TECHNIQUE: Multidetector CT images of the thoracic were obtained using the
standard protocol without intravenous contrast.

[Series 3: t spine soft · axial · 0.34mm/px · z∈[-514,-262]mm · 10 of 150 slices shown, 13 images]
[im 12/150  soft-tissue]
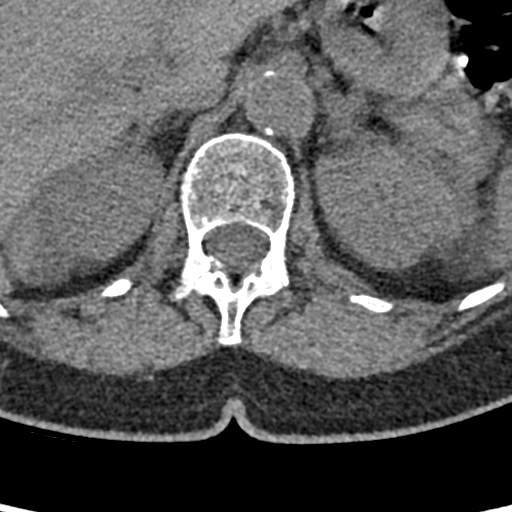
[im 12/150  bone]
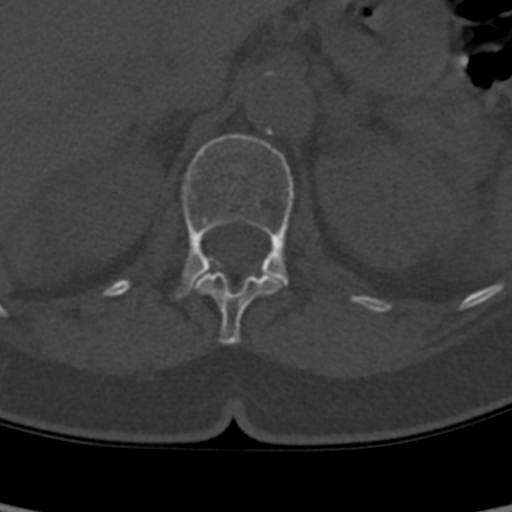
[im 23/150  bone]
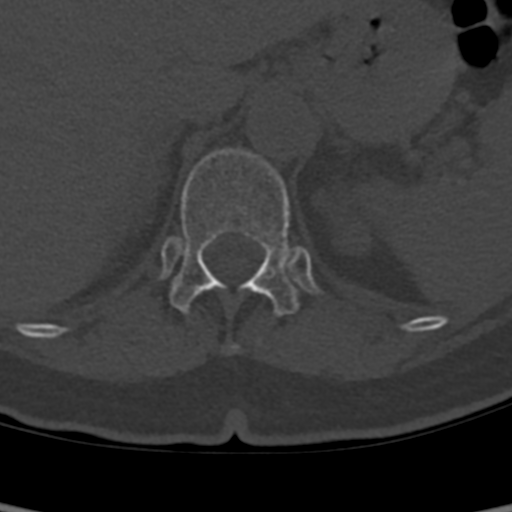
[im 46/150  bone]
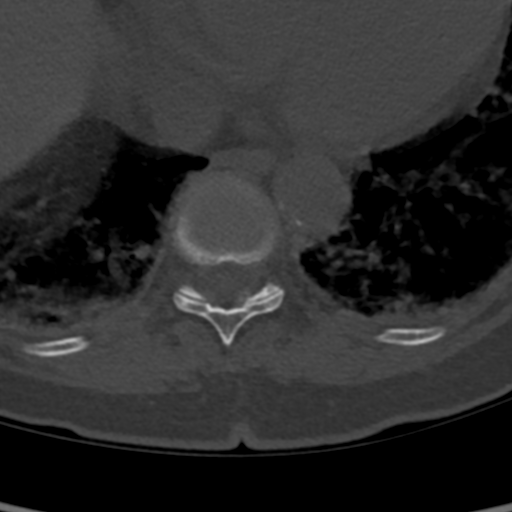
[im 58/150  bone]
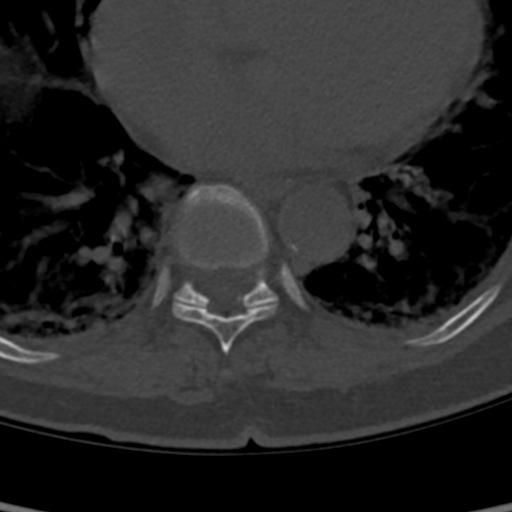
[im 69/150  soft-tissue]
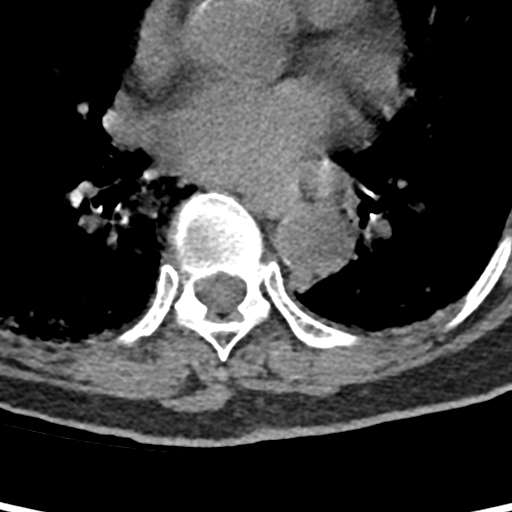
[im 69/150  bone]
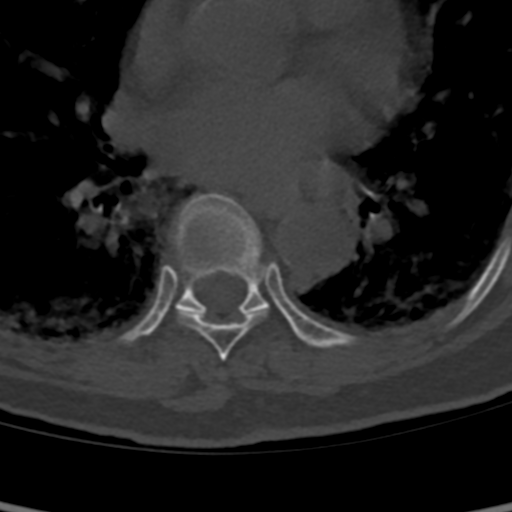
[im 81/150  bone]
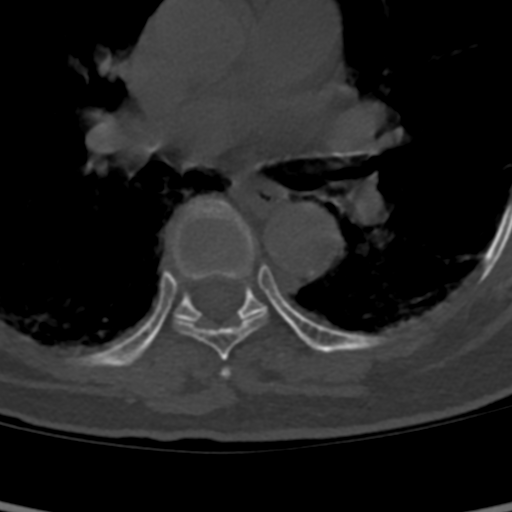
[im 92/150  bone]
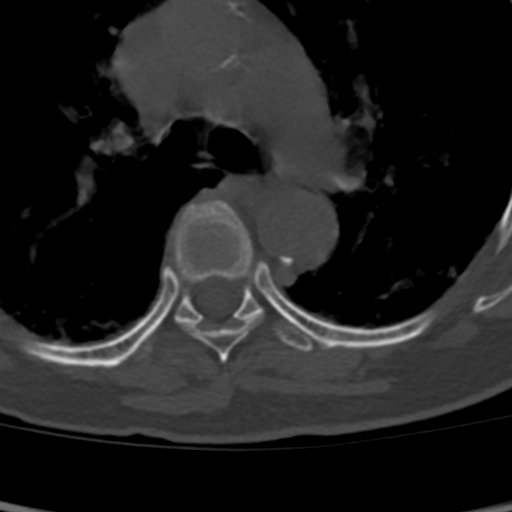
[im 115/150  bone]
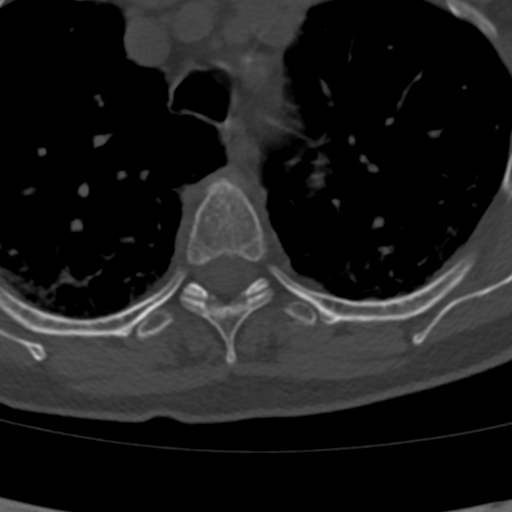
[im 127/150  soft-tissue]
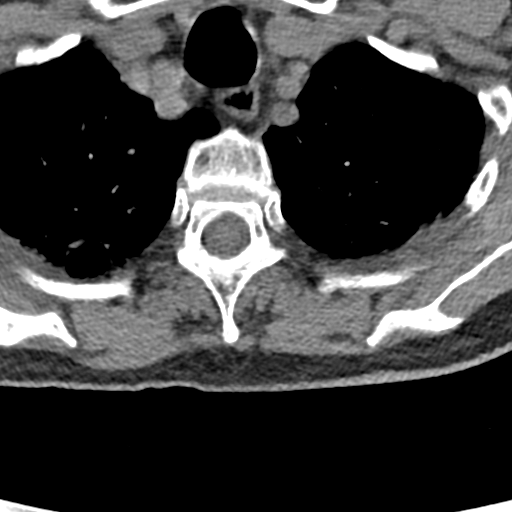
[im 127/150  bone]
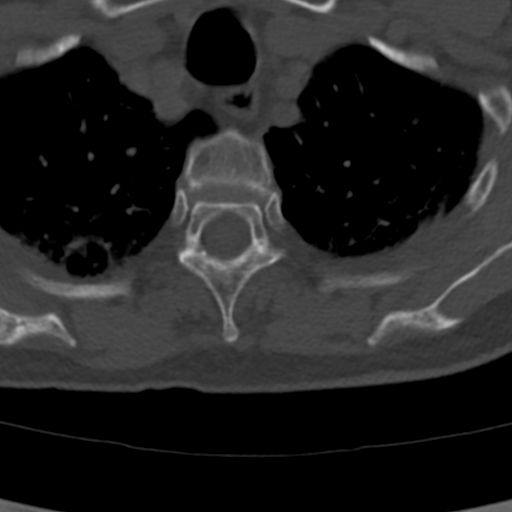
[im 138/150  bone]
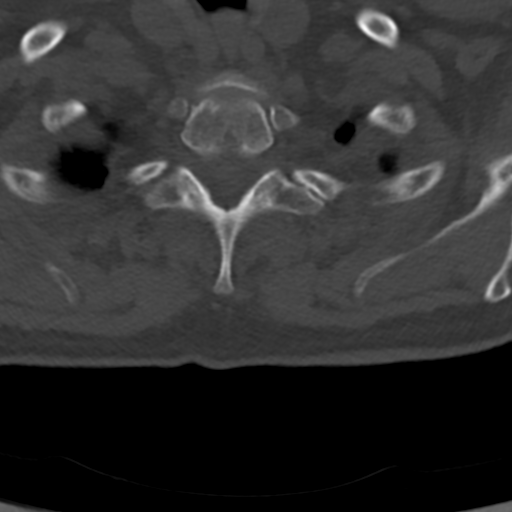

[Series 6: coronal bone · coronal · 0.30mm/px · 3 of 63 slices shown]
[im 13/63  bone]
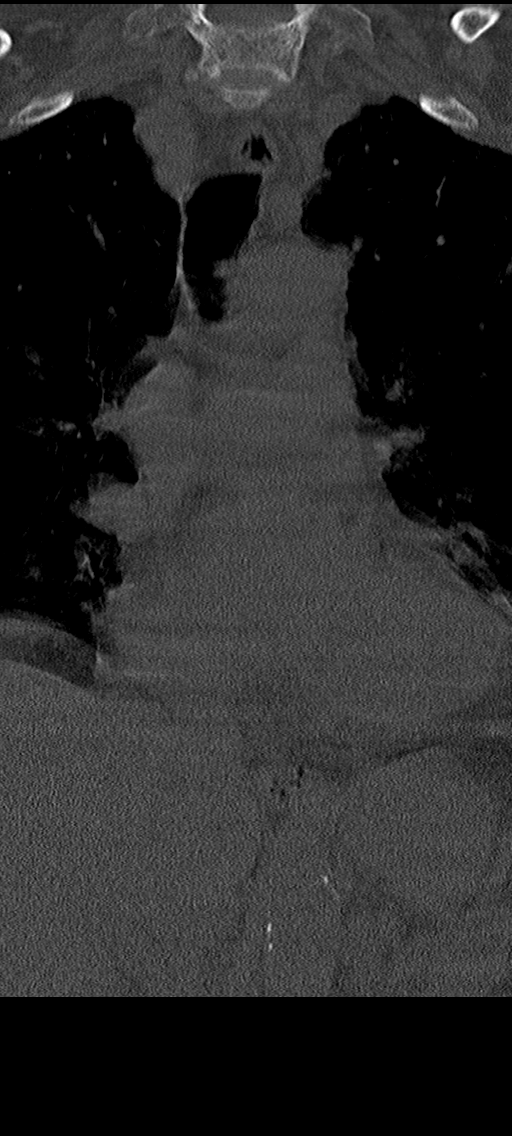
[im 25/63  bone]
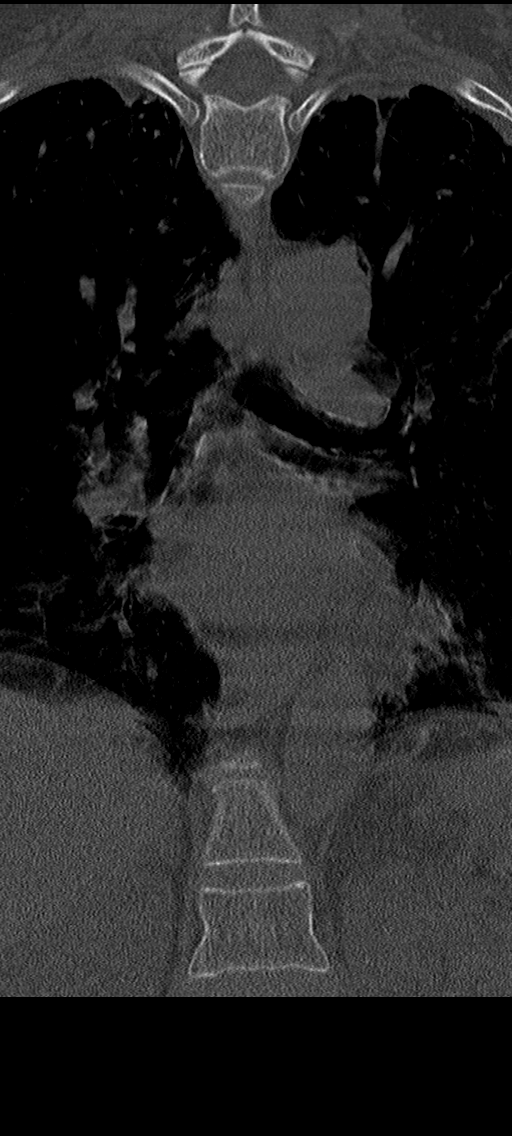
[im 38/63  bone]
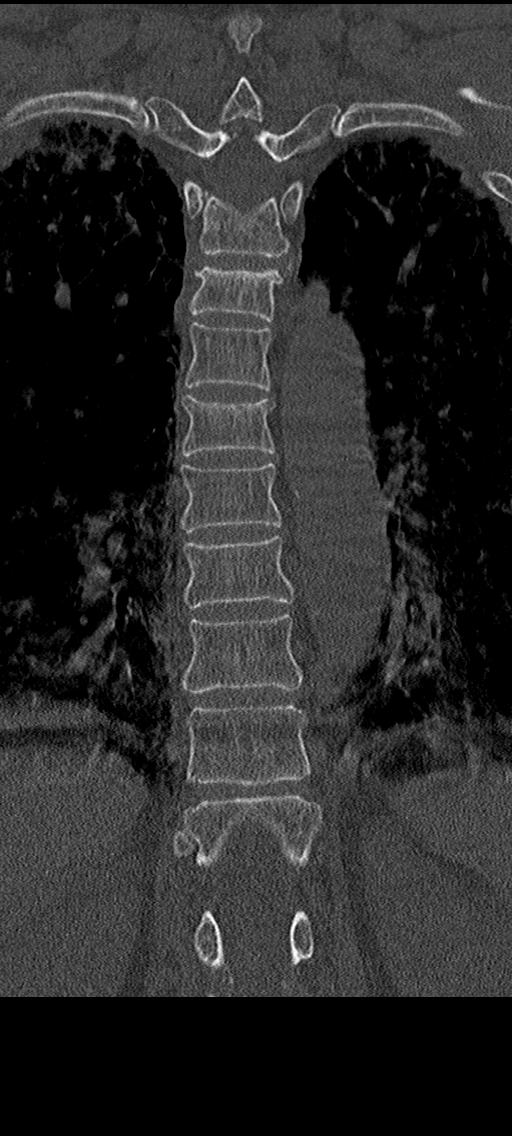

[13 of 33 positions shown; findings below may reference images not displayed]

FINDINGS: Alignment: Normal.

Vertebrae: Chronic T3 and T5 vertebral body compression fractures
without significant interval height loss compared with 01/13/2016.
Interval development of a new T7 vertebral body compression fracture
with approximately 15% height loss and without significant
retropulsion of the posterior margin of the T7 vertebral body. No
other vertebral body compression fracture. No aggressive osseous
lesion.

Paraspinal and other soft tissues: No paraspinal abnormality.
Thoracic aortic atherosclerosis. Bibasilar atelectasis. Severe
bilateral centrilobular and paraseptal emphysema.

Disc levels: Disc spaces are relatively well maintained. No
foraminal or central canal stenosis.
IMPRESSION: 1. Acute versus subacute T7 vertebral body compression fracture with
approximately 50% height loss.
2. Chronic T3 and T5 vertebral body compression fractures.
3.  Emphysema. (AN4P6-3J8.C)
4.  Aortic Atherosclerosis (AN4P6-170.0)

## 2018-09-01 DIAGNOSIS — R062 Wheezing: Secondary | ICD-10-CM | POA: Diagnosis not present

## 2018-09-01 DIAGNOSIS — J9601 Acute respiratory failure with hypoxia: Secondary | ICD-10-CM | POA: Diagnosis not present

## 2018-09-01 DIAGNOSIS — J449 Chronic obstructive pulmonary disease, unspecified: Secondary | ICD-10-CM | POA: Diagnosis not present

## 2018-09-07 ENCOUNTER — Other Ambulatory Visit: Payer: Self-pay | Admitting: *Deleted

## 2018-09-07 ENCOUNTER — Other Ambulatory Visit: Payer: Self-pay

## 2018-09-07 NOTE — Patient Outreach (Signed)
[facility]) Care Management [place] Screening HTA/ HRA outreach documentation- insurance referral 09/07/2018  Darneisha Windhorst 08-24-1932 [id number]  Follow up from HTA HRA successful screening phone call completed on June 12, 2018 with patient's daughter, Raphael Gibney, on [place]. HIPAA/ identity verified with caregiver during phone call.  Caregiver reports patient does not answer phone when she does not recognize incoming phone number.    Caregiver denies ongoing needs and no concerns were identified during phone call; caregiver reports patient has no chronic conditions and has diagnosis of COPD "without symptoms" and declines ongoing follow up around COPD, as patient is not symptomatic.  Caregiver reports that she is interested in learning more about creating Advanced Directives; this information was placed in mail to patient; explained to caregiver that she would be contacted in follow up by [facility] CSW to discuss information; caregiver states she works and may not be able to accept call.  Plan:  Patient to be followed in HRA engaged program.   Verified patient was mailed a welcome letter and educational material and planning packet for Advanced Directive planning  Oneta Rack, RN, BSN, [facility] Care Management  [phone]

## 2018-09-07 NOTE — Patient Outreach (Signed)
[facility]) Care Management  09/07/2018  Ellicia Alix 1933-01-28 [id number]   Social work referral received to follow up on Advance Directive information that was mailed to patient in March. Referral stated to call daughter.   Successful outreach to patient's daughter today.  Daughter confirmed that patient received documentation but said it has not yet been completed.  Daughter reported that she and patient have intended to review it but have not yet done so.  BSW offered to call back another day after they have time to review.  Daughter declined this but did ask for contact information to call me if questions arise when they are able to review/complete documents. BSW is closing case.  Ronn Melena, BSW Social Worker [phone]

## 2018-09-25 ENCOUNTER — Other Ambulatory Visit: Payer: Self-pay | Admitting: *Deleted

## 2018-09-25 NOTE — Patient Outreach (Signed)
[facility]) Care Management [facility] CM Case Closure note Patient active with external program 09/25/2018  Mary Howe Jun 23, 1932 [id number]  [facility] Management Case Closure note from previously placed HTA HRA screening phone call  Received confirmation from [facility] team that patient is now active with an external program  Plan:  Will close patient case and make patient inactive with [facility] CM   Oneta Rack, RN, BSN, [place] Coordinator [facility] Care Management  [phone]

## 2018-09-26 DIAGNOSIS — D2272 Melanocytic nevi of left lower limb, including hip: Secondary | ICD-10-CM | POA: Diagnosis not present

## 2018-09-26 DIAGNOSIS — D2261 Melanocytic nevi of right upper limb, including shoulder: Secondary | ICD-10-CM | POA: Diagnosis not present

## 2018-09-26 DIAGNOSIS — I872 Venous insufficiency (chronic) (peripheral): Secondary | ICD-10-CM | POA: Diagnosis not present

## 2018-09-26 DIAGNOSIS — L821 Other seborrheic keratosis: Secondary | ICD-10-CM | POA: Diagnosis not present

## 2018-09-26 DIAGNOSIS — D2271 Melanocytic nevi of right lower limb, including hip: Secondary | ICD-10-CM | POA: Diagnosis not present

## 2018-09-26 DIAGNOSIS — D225 Melanocytic nevi of trunk: Secondary | ICD-10-CM | POA: Diagnosis not present

## 2018-09-26 DIAGNOSIS — D2262 Melanocytic nevi of left upper limb, including shoulder: Secondary | ICD-10-CM | POA: Diagnosis not present

## 2018-10-02 DIAGNOSIS — J9601 Acute respiratory failure with hypoxia: Secondary | ICD-10-CM | POA: Diagnosis not present

## 2018-10-02 DIAGNOSIS — J449 Chronic obstructive pulmonary disease, unspecified: Secondary | ICD-10-CM | POA: Diagnosis not present

## 2018-10-02 DIAGNOSIS — R062 Wheezing: Secondary | ICD-10-CM | POA: Diagnosis not present

## 2018-11-01 ENCOUNTER — Telehealth: Payer: Self-pay | Admitting: *Deleted

## 2018-11-01 ENCOUNTER — Other Ambulatory Visit (INDEPENDENT_AMBULATORY_CARE_PROVIDER_SITE_OTHER): Payer: PPO

## 2018-11-01 ENCOUNTER — Other Ambulatory Visit: Payer: Self-pay

## 2018-11-01 DIAGNOSIS — J449 Chronic obstructive pulmonary disease, unspecified: Secondary | ICD-10-CM | POA: Diagnosis not present

## 2018-11-01 DIAGNOSIS — I152 Hypertension secondary to endocrine disorders: Secondary | ICD-10-CM

## 2018-11-01 DIAGNOSIS — R062 Wheezing: Secondary | ICD-10-CM | POA: Diagnosis not present

## 2018-11-01 DIAGNOSIS — J9601 Acute respiratory failure with hypoxia: Secondary | ICD-10-CM | POA: Diagnosis not present

## 2018-11-01 DIAGNOSIS — E1159 Type 2 diabetes mellitus with other circulatory complications: Secondary | ICD-10-CM | POA: Diagnosis not present

## 2018-11-01 DIAGNOSIS — I10 Essential (primary) hypertension: Secondary | ICD-10-CM | POA: Diagnosis not present

## 2018-11-01 LAB — LIPID PANEL
Cholesterol: 185 mg/dL (ref 0–200)
HDL: 89.6 mg/dL (ref >39.00)
LDL Cholesterol: 79 mg/dL (ref 0–99)
NonHDL: 94.94
Total CHOL/HDL Ratio: 2
Triglycerides: 80 mg/dL (ref 0.0–149.0)
VLDL: 16 mg/dL (ref 0.0–40.0)

## 2018-11-01 LAB — COMPREHENSIVE METABOLIC PANEL
ALT: 13 U/L (ref 0–35)
AST: 19 U/L (ref 0–37)
Albumin: 4 g/dL (ref 3.5–5.2)
Alkaline Phosphatase: 46 U/L (ref 39–117)
BUN: 9 mg/dL (ref 6–23)
CO2: 29 mEq/L (ref 19–32)
Calcium: 9.1 mg/dL (ref 8.4–10.5)
Chloride: 97 mEq/L (ref 96–112)
Creatinine, Ser: 0.54 mg/dL (ref 0.40–1.20)
GFR: 107.52 mL/min (ref >60.00)
Glucose, Bld: 102 mg/dL — ABNORMAL HIGH (ref 70–99)
Potassium: 3.6 mEq/L (ref 3.5–5.1)
Sodium: 133 mEq/L — ABNORMAL LOW (ref 135–145)
Total Bilirubin: 1 mg/dL (ref 0.2–1.2)
Total Protein: 6.3 g/dL (ref 6.0–8.3)

## 2018-11-01 LAB — MICROALBUMIN / CREATININE URINE RATIO
Creatinine,U: 32.1 mg/dL
Microalb Creat Ratio: 2.2 mg/g (ref 0.0–30.0)
Microalb, Ur: <0.7 mg/dL (ref 0.0–1.9)

## 2018-11-01 LAB — HEMOGLOBIN A1C: Hgb A1c MFr Bld: 5.9 % (ref 4.6–6.5)

## 2018-11-01 NOTE — Telephone Encounter (Signed)
Typical reaction to vaccine.  Cool compresses  tyleno ok to use

## 2018-11-01 NOTE — Telephone Encounter (Signed)
Pt came in for labs this morning & stated that she had a shingles shot at [facility] two days ago. Left arm is red, swollen, & hot to touch. Pt is aware that it could cause a rash or some redness. I notified pt that I would let you know and we would be in touch with her.

## 2018-11-01 NOTE — Telephone Encounter (Signed)
Pt aware & she also let [facility] know as well.

## 2018-11-01 NOTE — Telephone Encounter (Signed)
Pt also asked for someone to check her BP to see if her machine was right. Pt denies any symptoms (No headache, dizziness, or blurred vision).  I have updated her vitals for you, but they were as follows:  BP: 127/69 (right arm)  Temp: 97.9  Pulse: 65  Wt: 130.8

## 2018-11-04 ENCOUNTER — Other Ambulatory Visit: Payer: Self-pay | Admitting: Internal Medicine

## 2018-11-04 DIAGNOSIS — Z20822 Contact with and (suspected) exposure to covid-19: Secondary | ICD-10-CM

## 2018-11-04 DIAGNOSIS — Z20828 Contact with and (suspected) exposure to other viral communicable diseases: Secondary | ICD-10-CM | POA: Insufficient documentation

## 2018-11-04 DIAGNOSIS — K5790 Diverticulosis of intestine, part unspecified, without perforation or abscess without bleeding: Secondary | ICD-10-CM

## 2018-11-04 NOTE — Assessment & Plan Note (Signed)
Patient's son in law has had respiratory infection new onset anosmia for the last week.  His COVD tesing is pending.  She is  High risk for respiratory failure.  [place] 29

## 2018-11-05 ENCOUNTER — Ambulatory Visit: Payer: PPO | Admitting: Internal Medicine

## 2018-11-07 ENCOUNTER — Ambulatory Visit (INDEPENDENT_AMBULATORY_CARE_PROVIDER_SITE_OTHER): Payer: PPO | Admitting: Internal Medicine

## 2018-11-07 ENCOUNTER — Other Ambulatory Visit: Payer: Self-pay

## 2018-11-07 ENCOUNTER — Encounter: Payer: Self-pay | Admitting: Internal Medicine

## 2018-11-07 DIAGNOSIS — E1159 Type 2 diabetes mellitus with other circulatory complications: Secondary | ICD-10-CM | POA: Diagnosis not present

## 2018-11-07 DIAGNOSIS — J430 Unilateral pulmonary emphysema [MacLeod's syndrome]: Secondary | ICD-10-CM | POA: Diagnosis not present

## 2018-11-07 DIAGNOSIS — E119 Type 2 diabetes mellitus without complications: Secondary | ICD-10-CM

## 2018-11-07 DIAGNOSIS — I10 Essential (primary) hypertension: Secondary | ICD-10-CM | POA: Diagnosis not present

## 2018-11-07 DIAGNOSIS — K219 Gastro-esophageal reflux disease without esophagitis: Secondary | ICD-10-CM | POA: Diagnosis not present

## 2018-11-07 MED ORDER — ALENDRONATE SODIUM 70 MG PO TABS: 70.0000 mg | ORAL_TABLET | ORAL | 11 refills | Status: DC

## 2018-11-07 NOTE — Progress Notes (Signed)
Telephone  Note  This visit type was conducted due to national recommendations for restrictions regarding the COVID-19 pandemic (e.g. social distancing).  This format is felt to be most appropriate for this patient at this time.  All issues noted in this document were discussed and addressed.  No physical exam was performed (except for noted visual exam findings with Video Visits).   I connected with@ on 11/07/18 at 11:00 AM EDT by telephone and verified that I am speaking with the correct person using two identifiers. Location patient: home Location provider: work or home office Persons participating in the virtual visit: patient, provider  I discussed the limitations, risks, security and privacy concerns of performing an evaluation and management service by telephone and the availability of in person appointments. I also discussed with the patient that there may be a patient responsible charge related to this service. The patient expressed understanding and agreed to proceed.  Reason for visit: follow up  HPI:  [place] covid 19 and negative son in law had symptoms.  dtr did not.   "my nerves " are messed up due to COVID restrictions. Still getting together with freineds in her garag and social distancing,  Going for a walk daily  Using 02 at night regularly   Notices a slight drop that is transient if she goes up the staurs   Labs reviewed with patient    ROS: Patient denies headache, fevers, malaise, unintentional weight loss, skin rash, eye pain, sinus congestion and sinus pain, sore throat, dysphagia,  hemoptysis , cough, dyspnea, wheezing, chest pain, palpitations, orthopnea, edema, abdominal pain, nausea, melena, diarrhea, constipation, flank pain, dysuria, hematuria, urinary  Frequency, nocturia, numbness, tingling, seizures,  Focal weakness, Loss of consciousness,  Tremor, insomnia, depression, anxiety, and suicidal ideation.       Past Medical History:  Diagnosis Date  . CHF (congestive heart failure) ([place])   . COPD (chronic obstructive pulmonary disease) (HCC)    emphysema  . Dental bridge present    permanent, lower right  . Diverticulitis 2015  . Essential hypertension   . GERD (gastroesophageal reflux disease)   . Hyperlipidemia   . PAH (pulmonary artery hypertension) ([place])    a. 01/2017 Echo: PASP 79mmHg; b. 01/2017 RHC: mild PAH.  . Takotsubo cardiomyopathy    a. 01/2017 Echo: EF 25-30%, sev glob HK w/ basal regions moving well. Gr1 DD. mild MR, mildly dil LA, nl RV fxn, mild to mod TR, PASP 106mmHg; b. 01/2017 Cath: Nl cors w/ EF 35-45%. HK of distal segments suggestive of Takotsubo CM.   Marland Kitchen Thyroid disease   . Wears hearing aid in both ears     Past Surgical History:  Procedure Laterality Date  . CATARACT EXTRACTION W/PHACO Right 10/10/2017   Procedure: CATARACT EXTRACTION PHACO AND INTRAOCULAR LENS PLACEMENT ([place])  RIGHT;  Surgeon: Leandrew Koyanagi, MD;  Location: [facility];  Service: Ophthalmology;  Laterality: Right;  . CATARACT EXTRACTION W/PHACO Left 11/15/2017   Procedure: CATARACT EXTRACTION PHACO AND INTRAOCULAR LENS PLACEMENT ([place])  LEFT;  Surgeon: Leandrew Koyanagi, MD;  Location: [facility];  Service: Ophthalmology;  Laterality: Left;  . CERVICAL CONE BIOPSY    . CHOLECYSTECTOMY    . HEMORROIDECTOMY    . RIGHT/LEFT HEART CATH AND CORONARY ANGIOGRAPHY N/A 01/16/2017   Procedure: RIGHT/LEFT HEART CATH AND CORONARY ANGIOGRAPHY;  Surgeon: Wellington Hampshire, MD;  Location: [facility];  Service: Cardiovascular;  Laterality: N/A;  Family History  Problem Relation Age of Onset  . Prostate cancer Father   . Pneumonia Brother   . Lung disease Brother   . CAD Brother   . Heart disease Brother   . CAD Brother   . Heart attack Brother   . Brain cancer Brother   . Bone cancer Sister   . Emphysema Sister   . Fibrocystic breast disease Sister   . Aneurysm  Sister   . Heart disease Sister   . Breast cancer Neg Hx     SOCIAL HX:  reports that she quit smoking about 30 years ago. Her smoking use included cigarettes. She has a 43.50 pack-year smoking history. She has never used smokeless tobacco. She reports current alcohol use of about 7.0 standard drinks of alcohol per week. She reports that she does not use drugs.   Current Outpatient Medications:  .  acetaminophen (TYLENOL) 500 MG tablet, Take 500 mg by mouth every 6 (six) hours as needed., Disp: , Rfl:  .  albuterol (PROVENTIL HFA;VENTOLIN HFA) 108 (90 Base) MCG/ACT inhaler, Inhale 1-2 puffs into the lungs every 4 (four) hours as needed for wheezing or shortness of breath., Disp: 1 Inhaler, Rfl: 10 .  alendronate (FOSAMAX) 70 MG tablet, Take 1 tablet (70 mg total) by mouth every 7 (seven) days. Take with a full glass of water on an empty stomach., Disp: 4 tablet, Rfl: 11 .  ALPRAZolam (XANAX) 0.25 MG tablet, Take 1 tablet (0.25 mg total) by mouth 2 (two) times daily as needed for anxiety., Disp: 20 tablet, Rfl: 0 .  bisoprolol (ZEBETA) 5 MG tablet, TAKE 1/2 TABLET(2.5 MG) BY MOUTH DAILY, Disp: 45 tablet, Rfl: 1 .  budesonide (PULMICORT) 0.5 MG/2ML nebulizer solution, USE 1 VIAL VIA NEBULIZER TWICE DAILY, Disp: 120 mL, Rfl: 11 .  calcium carbonate (OS-CAL) 600 MG TABS tablet, Take 600 mg by mouth daily with breakfast. , Disp: , Rfl:  .  cetirizine (ZYRTEC) 10 MG tablet, Take 1 tablet (10 mg total) by mouth daily., Disp: 90 tablet, Rfl: 1 .  diphenhydrAMINE (BENADRYL) 25 MG tablet, Take 25 mg by mouth at bedtime as needed., Disp: , Rfl:  .  escitalopram (LEXAPRO) 10 MG tablet, Take 1 tablet (10 mg total) by mouth daily., Disp: 90 tablet, Rfl: 0 .  furosemide (LASIX) 20 MG tablet, Take 1 tablet (20 mg total) by mouth 2 (two) times daily., Disp: 180 tablet, Rfl: 1 .  MULTIPLE VITAMIN PO, Take 1 tablet by mouth daily. , Disp: , Rfl:  .  OXYGEN, Inhale 2.5 L daily into the lungs., Disp: , Rfl:  .   pantoprazole (PROTONIX) 40 MG tablet, TAKE 1 TABLET(40 MG) BY MOUTH DAILY, Disp: 90 tablet, Rfl: 1 .  triamcinolone cream (KENALOG) 0.1 %, Apply 1 application topically 2 (two) times daily., Disp: 30 g, Rfl: 0 .  valACYclovir (VALTREX) 1000 MG tablet, Take 1,000 mg by mouth 2 (two) times daily. , Disp: , Rfl:   EXAM:  VITALS per patient if applicable:  GENERAL: alert, oriented, appears well and in no acute distress  HEENT: atraumatic, conjunttiva clear, no obvious abnormalities on inspection of external nose and ears   ASSESSMENT AND PLAN:  Hypertension associated with type 2 diabetes mellitus ([place]) Well controlled on current regimen. Renal function stable, no changes today.  Lab Results  Component Value Date   CREATININE 0.54 11/01/2018   Lab Results  Component Value Date   NA 133 (L) 11/01/2018   K 3.6 11/01/2018  CL 97 11/01/2018   CO2 29 11/01/2018     Diabetes mellitus without complication (HCC) Diagnosed with fasting glucose of 125 and a1c 6.4 . A1c has  Improved with low gi diet.  no medications needed   Lab Results  Component Value Date   HGBA1C 5.9 11/01/2018   Lab Results  Component Value Date   MICROALBUR <0.7 11/01/2018    COPD (chronic obstructive pulmonary disease) (HCC) secondary to advance panlobular emphysema Fev/FVC of 60%.  She has improved her exerise tolerance and is now 02 dependent only at night.  Sleep study done Feb 2019 indicates that 74% of her time asleep is spent with an 02 sat < 90%  She is using nocturnal oxygen  And has increased use of Pulmicort to twice daily     I discussed the assessment and treatment plan with the patient. The patient was provided an opportunity to ask questions and all were answered. The patient agreed with the plan and demonstrated an understanding of the instructions.   The patient was advised to call back or seek an in-person evaluation if the symptoms worsen or if the condition fails to improve as  anticipated.  I provided  25 minutes of non-face-to-face time during this encounter.   [name], MD

## 2018-11-08 NOTE — Assessment & Plan Note (Signed)
Well controlled on current regimen. Renal function stable, no changes today.  Lab Results  Component Value Date   CREATININE 0.54 11/01/2018   Lab Results  Component Value Date   NA 133 (L) 11/01/2018   K 3.6 11/01/2018   CL 97 11/01/2018   CO2 29 11/01/2018

## 2018-11-08 NOTE — Assessment & Plan Note (Signed)
secondary to advance panlobular emphysema Fev/FVC of 60%.  She has improved her exerise tolerance and is now 02 dependent only at night.  Sleep study done Feb 2019 indicates that 74% of her time asleep is spent with an 02 sat < 90%  She is using nocturnal oxygen  And has increased use of Pulmicort to twice daily

## 2018-11-08 NOTE — Assessment & Plan Note (Signed)
Diagnosed with fasting glucose of 125 and a1c 6.4 . A1c has  Improved with low gi diet.  no medications needed   Lab Results  Component Value Date   HGBA1C 5.9 11/01/2018   Lab Results  Component Value Date   MICROALBUR <0.7 11/01/2018

## 2018-11-26 ENCOUNTER — Other Ambulatory Visit: Payer: Self-pay

## 2018-11-26 MED ORDER — ESCITALOPRAM OXALATE 10 MG PO TABS: 10.0000 mg | ORAL_TABLET | Freq: Every day | ORAL | 1 refills | Status: DC

## 2018-12-02 DIAGNOSIS — R062 Wheezing: Secondary | ICD-10-CM | POA: Diagnosis not present

## 2018-12-02 DIAGNOSIS — J449 Chronic obstructive pulmonary disease, unspecified: Secondary | ICD-10-CM | POA: Diagnosis not present

## 2018-12-02 DIAGNOSIS — J9601 Acute respiratory failure with hypoxia: Secondary | ICD-10-CM | POA: Diagnosis not present

## 2018-12-11 ENCOUNTER — Ambulatory Visit: Payer: PPO | Admitting: *Deleted

## 2018-12-27 ENCOUNTER — Other Ambulatory Visit: Payer: Self-pay

## 2018-12-27 MED ORDER — BISOPROLOL FUMARATE 5 MG PO TABS: ORAL_TABLET | ORAL | 1 refills | Status: DC

## 2019-01-02 DIAGNOSIS — J9601 Acute respiratory failure with hypoxia: Secondary | ICD-10-CM | POA: Diagnosis not present

## 2019-01-02 DIAGNOSIS — R062 Wheezing: Secondary | ICD-10-CM | POA: Diagnosis not present

## 2019-01-02 DIAGNOSIS — J449 Chronic obstructive pulmonary disease, unspecified: Secondary | ICD-10-CM | POA: Diagnosis not present

## 2019-01-03 ENCOUNTER — Other Ambulatory Visit: Payer: Self-pay

## 2019-01-03 DIAGNOSIS — K219 Gastro-esophageal reflux disease without esophagitis: Secondary | ICD-10-CM

## 2019-01-03 MED ORDER — PANTOPRAZOLE SODIUM 40 MG PO TBEC: DELAYED_RELEASE_TABLET | ORAL | 1 refills | Status: DC

## 2019-01-04 ENCOUNTER — Other Ambulatory Visit: Payer: Self-pay

## 2019-01-04 MED ORDER — FUROSEMIDE 20 MG PO TABS: 20.0000 mg | ORAL_TABLET | Freq: Two times a day (BID) | ORAL | 1 refills | Status: DC

## 2019-02-01 DIAGNOSIS — J9601 Acute respiratory failure with hypoxia: Secondary | ICD-10-CM | POA: Diagnosis not present

## 2019-02-01 DIAGNOSIS — R062 Wheezing: Secondary | ICD-10-CM | POA: Diagnosis not present

## 2019-02-01 DIAGNOSIS — J449 Chronic obstructive pulmonary disease, unspecified: Secondary | ICD-10-CM | POA: Diagnosis not present

## 2019-03-01 ENCOUNTER — Other Ambulatory Visit: Payer: Self-pay

## 2019-03-01 DIAGNOSIS — R059 Cough, unspecified: Secondary | ICD-10-CM

## 2019-03-01 DIAGNOSIS — R05 Cough: Secondary | ICD-10-CM

## 2019-03-01 MED ORDER — CETIRIZINE HCL 10 MG PO TABS: 10.0000 mg | ORAL_TABLET | Freq: Every day | ORAL | 1 refills | Status: AC

## 2019-03-04 DIAGNOSIS — R062 Wheezing: Secondary | ICD-10-CM | POA: Diagnosis not present

## 2019-03-04 DIAGNOSIS — J9601 Acute respiratory failure with hypoxia: Secondary | ICD-10-CM | POA: Diagnosis not present

## 2019-03-04 DIAGNOSIS — J449 Chronic obstructive pulmonary disease, unspecified: Secondary | ICD-10-CM | POA: Diagnosis not present

## 2019-03-05 ENCOUNTER — Other Ambulatory Visit: Payer: Self-pay

## 2019-03-05 DIAGNOSIS — Z20822 Contact with and (suspected) exposure to covid-19: Secondary | ICD-10-CM

## 2019-03-06 LAB — NOVEL CORONAVIRUS, NAA: SARS-CoV-2, NAA: NOT DETECTED

## 2019-03-14 ENCOUNTER — Other Ambulatory Visit: Payer: Self-pay

## 2019-03-14 MED ORDER — VALACYCLOVIR HCL 1 G PO TABS: 1000.0000 mg | ORAL_TABLET | Freq: Two times a day (BID) | ORAL | 5 refills | Status: DC

## 2019-03-14 NOTE — Addendum Note (Signed)
Addended by: Adair Laundry on: 03/14/2019 03:15 PM   Modules accepted: Orders

## 2019-03-14 NOTE — Addendum Note (Signed)
Addended by: [name] on: 03/14/2019 08:44 PM   Modules accepted: Orders

## 2019-03-14 NOTE — Telephone Encounter (Signed)
Refilled: 12/08/2015 Last OV: 11/07/2018 Next OV: 06/10/2019

## 2019-03-14 NOTE — Telephone Encounter (Signed)
Refill request for valtrex. Patient has not had refill since 2017.

## 2019-03-14 NOTE — Telephone Encounter (Signed)
Not sure why she is taking this (for a herpes  outbreak?  If os, then the dose if twice daily for one week followed by once daily forever fo prevention .  If this is a recurrent probem we may be  able to prevent

## 2019-04-02 IMAGING — DX DG CHEST 1V PORT
1 series · 1 of 1 positions shown · non-contrast
Comparison: 01/11/2017

CLINICAL DATA: Acute respiratory failure

EXAM:
PORTABLE CHEST 1 VIEW

[chest ap]
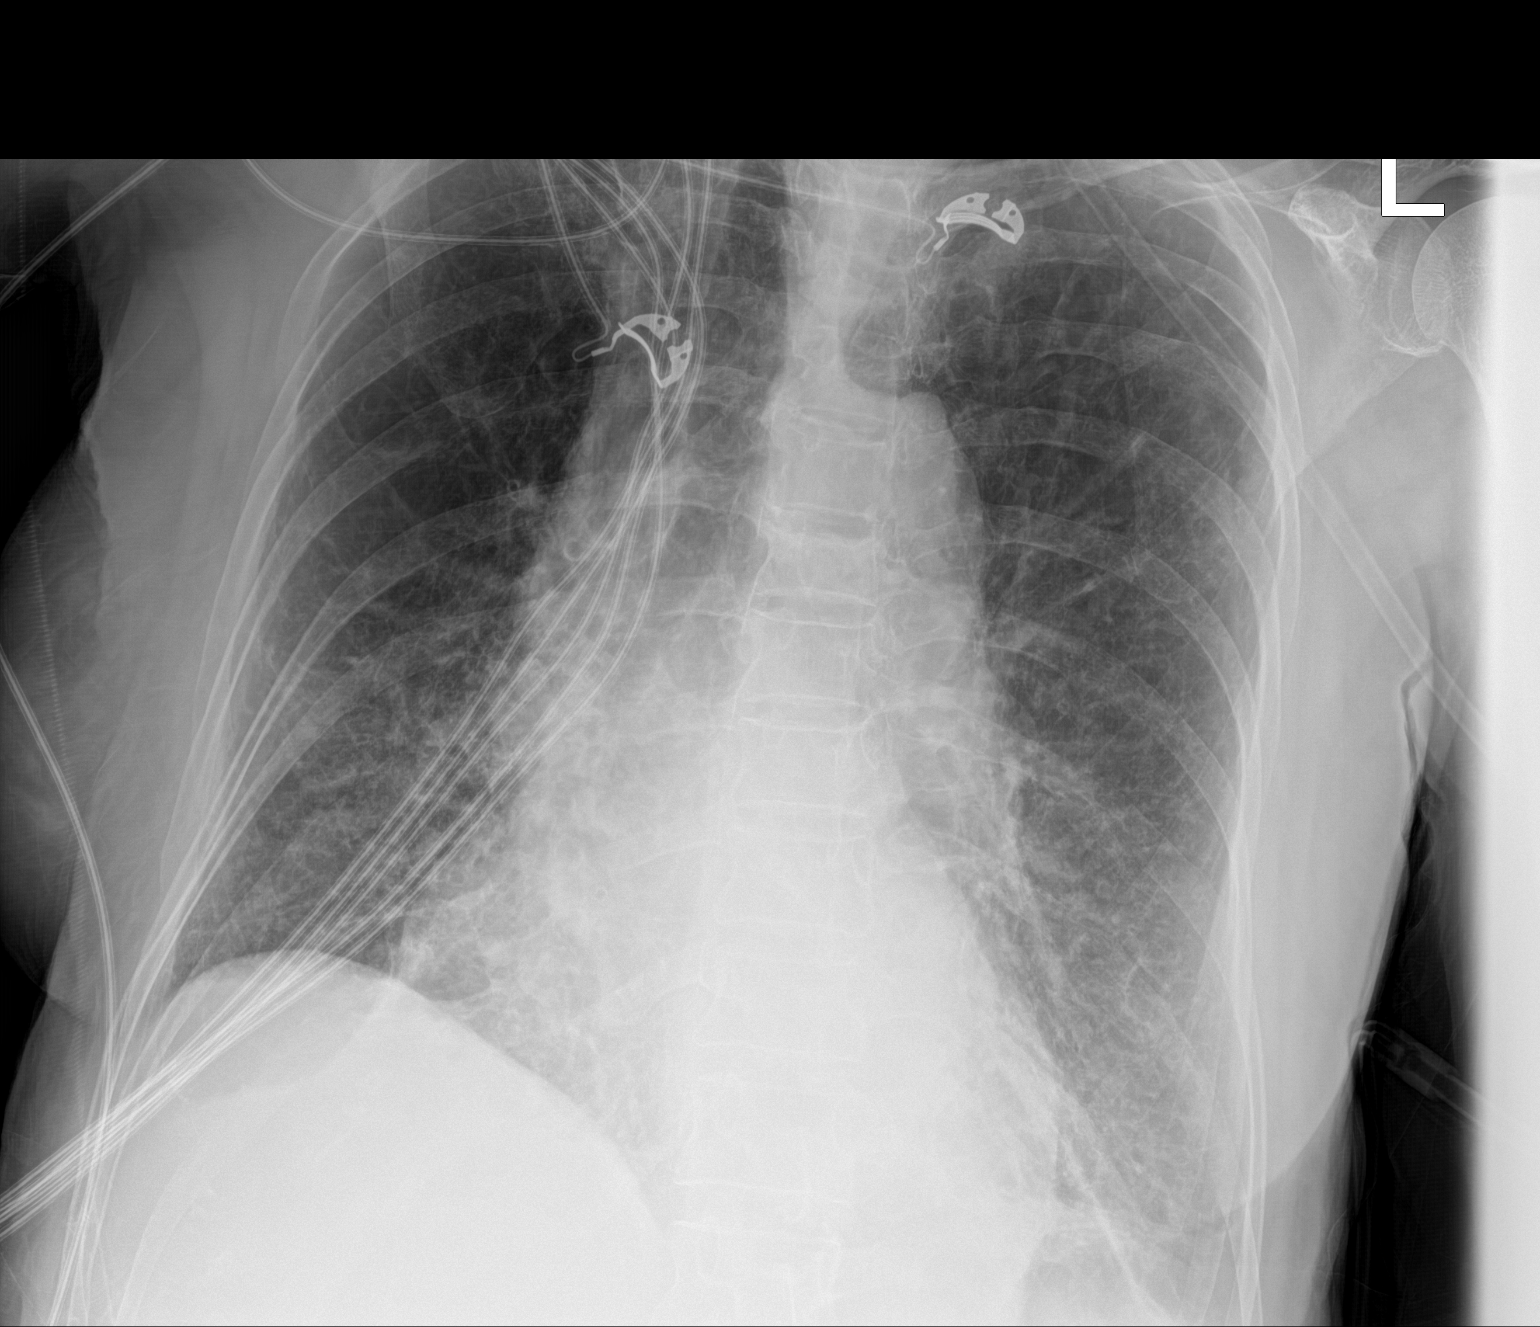

[1 of 1 positions shown; findings below may reference images not displayed]

FINDINGS: Cardiac shadow remains mildly enlarged. Tortuosity of thoracic aorta
is noted. Diffuse interstitial changes are again identified and
stable from the prior exam. No focal infiltrate is seen. No bony
abnormality is noted.
IMPRESSION: Diffuse interstitial changes stable from the previous exam.

## 2019-04-03 DIAGNOSIS — R062 Wheezing: Secondary | ICD-10-CM | POA: Diagnosis not present

## 2019-04-03 DIAGNOSIS — J449 Chronic obstructive pulmonary disease, unspecified: Secondary | ICD-10-CM | POA: Diagnosis not present

## 2019-04-03 DIAGNOSIS — J9601 Acute respiratory failure with hypoxia: Secondary | ICD-10-CM | POA: Diagnosis not present

## 2019-05-04 DIAGNOSIS — J9601 Acute respiratory failure with hypoxia: Secondary | ICD-10-CM | POA: Diagnosis not present

## 2019-05-04 DIAGNOSIS — R062 Wheezing: Secondary | ICD-10-CM | POA: Diagnosis not present

## 2019-05-04 DIAGNOSIS — J449 Chronic obstructive pulmonary disease, unspecified: Secondary | ICD-10-CM | POA: Diagnosis not present

## 2019-06-03 ENCOUNTER — Other Ambulatory Visit: Payer: Self-pay | Admitting: Internal Medicine

## 2019-06-03 DIAGNOSIS — Z1231 Encounter for screening mammogram for malignant neoplasm of breast: Secondary | ICD-10-CM

## 2019-06-04 ENCOUNTER — Other Ambulatory Visit: Payer: Self-pay

## 2019-06-04 MED ORDER — ESCITALOPRAM OXALATE 10 MG PO TABS: 10.0000 mg | ORAL_TABLET | Freq: Every day | ORAL | 1 refills | Status: DC

## 2019-06-05 ENCOUNTER — Other Ambulatory Visit: Payer: Self-pay

## 2019-06-10 ENCOUNTER — Ambulatory Visit (INDEPENDENT_AMBULATORY_CARE_PROVIDER_SITE_OTHER): Payer: PPO

## 2019-06-10 ENCOUNTER — Encounter: Payer: Self-pay | Admitting: Internal Medicine

## 2019-06-10 ENCOUNTER — Ambulatory Visit (INDEPENDENT_AMBULATORY_CARE_PROVIDER_SITE_OTHER): Payer: PPO | Admitting: Internal Medicine

## 2019-06-10 ENCOUNTER — Other Ambulatory Visit: Payer: Self-pay

## 2019-06-10 ENCOUNTER — Ambulatory Visit: Payer: PPO

## 2019-06-10 VITALS — BP 108/66 | HR 66 | Temp 96.9°F | Resp 15 | Ht 63.0 in | Wt 133.8 lb

## 2019-06-10 VITALS — Ht 63.0 in | Wt 133.0 lb

## 2019-06-10 DIAGNOSIS — E559 Vitamin D deficiency, unspecified: Secondary | ICD-10-CM

## 2019-06-10 DIAGNOSIS — J430 Unilateral pulmonary emphysema [MacLeod's syndrome]: Secondary | ICD-10-CM

## 2019-06-10 DIAGNOSIS — M8008XD Age-related osteoporosis with current pathological fracture, vertebra(e), subsequent encounter for fracture with routine healing: Secondary | ICD-10-CM

## 2019-06-10 DIAGNOSIS — E7800 Pure hypercholesterolemia, unspecified: Secondary | ICD-10-CM | POA: Diagnosis not present

## 2019-06-10 DIAGNOSIS — Z0000 Encounter for general adult medical examination without abnormal findings: Secondary | ICD-10-CM | POA: Diagnosis not present

## 2019-06-10 DIAGNOSIS — F411 Generalized anxiety disorder: Secondary | ICD-10-CM | POA: Diagnosis not present

## 2019-06-10 DIAGNOSIS — E119 Type 2 diabetes mellitus without complications: Secondary | ICD-10-CM | POA: Diagnosis not present

## 2019-06-10 DIAGNOSIS — R748 Abnormal levels of other serum enzymes: Secondary | ICD-10-CM

## 2019-06-10 LAB — LIPID PANEL
Cholesterol: 183 mg/dL (ref 0–200)
HDL: 88.9 mg/dL (ref >39.00)
LDL Cholesterol: 78 mg/dL (ref 0–99)
NonHDL: 94.57
Total CHOL/HDL Ratio: 2
Triglycerides: 84 mg/dL (ref 0.0–149.0)
VLDL: 16.8 mg/dL (ref 0.0–40.0)

## 2019-06-10 LAB — COMPREHENSIVE METABOLIC PANEL
ALT: 13 U/L (ref 0–35)
AST: 22 U/L (ref 0–37)
Albumin: 4 g/dL (ref 3.5–5.2)
Alkaline Phosphatase: 59 U/L (ref 39–117)
BUN: 6 mg/dL (ref 6–23)
CO2: 26 mEq/L (ref 19–32)
Calcium: 9.2 mg/dL (ref 8.4–10.5)
Chloride: 100 mEq/L (ref 96–112)
Creatinine, Ser: 0.57 mg/dL (ref 0.40–1.20)
GFR: 100.87 mL/min (ref >60.00)
Glucose, Bld: 109 mg/dL — ABNORMAL HIGH (ref 70–99)
Potassium: 3.5 mEq/L (ref 3.5–5.1)
Sodium: 136 mEq/L (ref 135–145)
Total Bilirubin: 1 mg/dL (ref 0.2–1.2)
Total Protein: 7.2 g/dL (ref 6.0–8.3)

## 2019-06-10 LAB — TSH: TSH: 5.18 u[IU]/mL — ABNORMAL HIGH (ref 0.35–4.50)

## 2019-06-10 LAB — VITAMIN D 25 HYDROXY (VIT D DEFICIENCY, FRACTURES): VITD: 54.15 ng/mL (ref 30.00–100.00)

## 2019-06-10 LAB — HEMOGLOBIN A1C: Hgb A1c MFr Bld: 5.9 % (ref 4.6–6.5)

## 2019-06-10 NOTE — Assessment & Plan Note (Signed)
Diagnosed in 2018 with fasting glucose of 125 and a1c 6.4 . A1c has  Remained < 6.0  with low gi diet.  no medications needed   Lab Results  Component Value Date   HGBA1C 5.9 11/01/2018   Lab Results  Component Value Date   MICROALBUR <0.7 11/01/2018

## 2019-06-10 NOTE — Progress Notes (Signed)
Subjective:  Patient ID: Mary Howe, female    DOB: 1932/06/10  Age: 86 y.o. MRN: [id number]  CC: The primary encounter diagnosis was Elevated liver enzymes. Diagnoses of Diabetes mellitus without complication ([place]), Hypercholesterolemia, Vitamin D deficiency, Age-related osteoporosis with current pathological fracture of vertebra with routine healing, subsequent encounter, Anxiety, generalized, and Unilateral emphysema ([place]) were also pertinent to this visit.  HPI Mary Howe presents for follow up on COPD , oxygen dependet,   diet controlled diabetes mellitus,  hypertension , GAD and osteoporosis.  This visit occurred during the SARS-CoV-2 public health emergency.  Safety protocols were in place, including screening questions prior to the visit, additional usage of staff PPE, and extensive cleaning of exam room while observing appropriate contact time as indicated for disinfecting solutions.   COPD:  No recent exacerbations.  Using oxygen only at night, concentrator now  Supplied by [facility]. (no longer [facility]). Limiting outside contact to grocer store and walking in neighborhood with friends  GAD:  Triggered by sister Mary Howe personality conflicts and confinement with her . Had a verbal confrontation with her recently,  Feels better now.  Sleeping well.    Diet controlled DM: watching her sugar intake denies numbness, tingling.   Patient is taking her medications as prescribed and notes no adverse effects.  Home BP readings have been done about once per week and are  generally < 130/80 .  She is avoiding added salt in her diet and walking regularly about 3 times per week for exercise  . Outpatient Medications Prior to Visit  Medication Sig Dispense Refill  . acetaminophen (TYLENOL) 500 MG tablet Take 500 mg by mouth every 6 (six) hours as needed.    Marland Kitchen albuterol (PROVENTIL HFA;VENTOLIN HFA) 108 (90 Base) MCG/ACT inhaler Inhale 1-2 puffs into the lungs every 4 (four) hours as  needed for wheezing or shortness of breath. 1 Inhaler 10  . alendronate (FOSAMAX) 70 MG tablet Take 1 tablet (70 mg total) by mouth every 7 (seven) days. Take with a full glass of water on an empty stomach. 4 tablet 11  . ALPRAZolam (XANAX) 0.25 MG tablet Take 1 tablet (0.25 mg total) by mouth 2 (two) times daily as needed for anxiety. 20 tablet 0  . bisoprolol (ZEBETA) 5 MG tablet TAKE 1/2 TABLET(2.5 MG) BY MOUTH DAILY 45 tablet 1  . budesonide (PULMICORT) 0.5 MG/2ML nebulizer solution USE 1 VIAL VIA NEBULIZER TWICE DAILY 120 mL 11  . calcium carbonate (OS-CAL) 600 MG TABS tablet Take 600 mg by mouth daily with breakfast.     . cetirizine (ZYRTEC) 10 MG tablet Take 1 tablet (10 mg total) by mouth daily. 90 tablet 1  . diphenhydrAMINE (BENADRYL) 25 MG tablet Take 25 mg by mouth at bedtime as needed.    Marland Kitchen escitalopram (LEXAPRO) 10 MG tablet Take 1 tablet (10 mg total) by mouth daily. 90 tablet 1  . furosemide (LASIX) 20 MG tablet Take 1 tablet (20 mg total) by mouth 2 (two) times daily. 180 tablet 1  . MULTIPLE VITAMIN PO Take 1 tablet by mouth daily.     . OXYGEN Inhale 2.5 L daily into the lungs.    . pantoprazole (PROTONIX) 40 MG tablet TAKE 1 TABLET(40 MG) BY MOUTH DAILY 90 tablet 1  . triamcinolone cream (KENALOG) 0.1 % Apply 1 application topically 2 (two) times daily. 30 g 0  . valACYclovir (VALTREX) 1000 MG tablet Take 1 tablet (1,000 mg total) by mouth 2 (two) times daily. For  one week, then reduce use to once daily 37 tablet 5   No facility-administered medications prior to visit.    Review of Systems;  Patient denies headache, fevers, malaise, unintentional weight loss, skin rash, eye pain, sinus congestion and sinus pain, sore throat, dysphagia,  hemoptysis , cough, dyspnea, wheezing, chest pain, palpitations, orthopnea, edema, abdominal pain, nausea, melena, diarrhea, constipation, flank pain, dysuria, hematuria, urinary  Frequency, nocturia, numbness, tingling, seizures,  Focal  weakness, Loss of consciousness,  Tremor, insomnia, depression, anxiety, and suicidal ideation.      Objective:  BP 108/66 (BP Location: Left Arm, Patient Position: Sitting, Cuff Size: Normal)   Pulse 66   Temp (!) 96.9 F (36.1 C) (Temporal)   Resp 15   Ht 5\' 3"  (1.6 m)   Wt 133 lb 12.8 oz (60.7 kg)   SpO2 98%   BMI 23.70 kg/m   BP Readings from Last 3 Encounters:  06/10/19 108/66  11/07/18 120/64  11/01/18 127/69    Wt Readings from Last 3 Encounters:  06/10/19 133 lb 12.8 oz (60.7 kg)  11/01/18 130 lb 8 oz (59.2 kg)  06/18/18 131 lb (59.4 kg)    General appearance: alert, cooperative and appears stated age Ears: normal TM's and external ear canals both ears Throat: lips, mucosa, and tongue normal; teeth and gums normal Neck: no adenopathy, no carotid bruit, supple, symmetrical, trachea midline and thyroid not enlarged, symmetric, no tenderness/mass/nodules Back: symmetric, no curvature. ROM normal. No CVA tenderness. Lungs: clear to auscultation bilaterally Heart: regular rate and rhythm, S1, S2 normal, no murmur, click, rub or gallop Abdomen: soft, non-tender; bowel sounds normal; no masses,  no organomegaly Pulses: 2+ and symmetric Skin: Skin color, texture, turgor normal. No rashes or lesions Lymph nodes: Cervical, supraclavicular, and axillary nodes normal.  Lab Results  Component Value Date   HGBA1C 5.9 11/01/2018   HGBA1C 6.1 04/30/2018   HGBA1C 6.0 11/28/2017    Lab Results  Component Value Date   CREATININE 0.54 11/01/2018   CREATININE 0.66 04/30/2018   CREATININE 0.67 11/28/2017    Lab Results  Component Value Date   WBC 11.1 (H) 01/16/2017   HGB 14.8 01/16/2017   HCT 43.0 01/16/2017   PLT 282 01/16/2017   GLUCOSE 102 (H) 11/01/2018   CHOL 185 11/01/2018   TRIG 80.0 11/01/2018   HDL 89.60 11/01/2018   LDLDIRECT 89.0 07/01/2016   LDLCALC 79 11/01/2018   ALT 13 11/01/2018   AST 19 11/01/2018   NA 133 (L) 11/01/2018   K 3.6 11/01/2018    CL 97 11/01/2018   CREATININE 0.54 11/01/2018   BUN 9 11/01/2018   CO2 29 11/01/2018   TSH 3.97 11/28/2017   INR 1.09 01/16/2017   HGBA1C 5.9 11/01/2018   MICROALBUR <0.7 11/01/2018    MM 3D SCREEN BREAST BILATERAL  Result Date: 05/29/2018 CLINICAL DATA:  Screening. EXAM: DIGITAL SCREENING BILATERAL MAMMOGRAM WITH TOMO AND CAD COMPARISON:  Previous exam(s). ACR Breast Density Category b: There are scattered areas of fibroglandular density. FINDINGS: There are no findings suspicious for malignancy. Images were processed with CAD. IMPRESSION: No mammographic evidence of malignancy. A result letter of this screening mammogram will be mailed directly to the patient. RECOMMENDATION: Screening mammogram in one year. (Code:SM-B-01Y) BI-RADS CATEGORY  1: Negative. Electronically Signed   By: Curlene Dolphin M.D.   On: 05/29/2018 16:36    Assessment & Plan:   Problem List Items Addressed This Visit      Unprioritized   Hypercholesterolemia  Relevant Orders   TSH   Age-related osteoporosis with current pathol fracture of vertebra ([place])    Denies back pain currently.  Reviewed calcium and vitamin D needs,  And encouarged more regular use of alendronate       Anxiety, generalized    Aggravated by health issues and overbearing sister. Improved with Lexapro at 5 mg daily and prn xanax. . The risks and benefits of benzodiazepine use were reviewedd with patient today including excessive sedation leading to respiratory depression,  impaired thinking/driving, and addiction.  Patient was advised to avoid concurrent use with alcohol, to use medication only as needed and not to share with others  .       COPD (chronic obstructive pulmonary disease) (HCC)    secondary to advanced panlobular emphysema Fev/FVC of 60%.  She has improved her exerise tolerance and is now 02 dependent only at night.  Sleep study done Feb 2019 indicates that 74% of her time asleep is spent with an 02 sat < 90%  She is using  nocturnal oxygen  And has increased use of Pulmicort to twice daily       Diabetes mellitus without complication ([place])    Diagnosed in 2018 with fasting glucose of 125 and a1c 6.4 . A1c has  Remained < 6.0  with low gi diet.  no medications needed   Lab Results  Component Value Date   HGBA1C 5.9 11/01/2018   Lab Results  Component Value Date   MICROALBUR <0.7 11/01/2018        Relevant Orders   Hemoglobin A1c   Lipid panel   RESOLVED: Elevated liver enzymes - Primary   Relevant Orders   Comprehensive metabolic panel    Other Visit Diagnoses    Vitamin D deficiency       Relevant Orders   VITAMIN D 25 Hydroxy (Vit-D Deficiency, Fractures)       I provided  30 minutes of face-to-face time during this encounter reviewing patient's current problems and past surgeries, labs and imaging studies, providing counseling on the above mentioned problems , and coordination  of care .  I am having Mary Howe maintain her MULTIPLE VITAMIN PO, calcium carbonate, albuterol, diphenhydrAMINE, acetaminophen, OXYGEN, triamcinolone cream, ALPRAZolam, budesonide, alendronate, bisoprolol, pantoprazole, furosemide, cetirizine, valACYclovir, and escitalopram.  No orders of the defined types were placed in this encounter.   There are no discontinued medications.  Follow-up: No follow-ups on file.   [name], MD

## 2019-06-10 NOTE — Assessment & Plan Note (Signed)
secondary to advanced panlobular emphysema Fev/FVC of 60%.  She has improved her exerise tolerance and is now 02 dependent only at night.  Sleep study done Feb 2019 indicates that 74% of her time asleep is spent with an 02 sat < 90%  She is using nocturnal oxygen  And has increased use of Pulmicort to twice daily

## 2019-06-10 NOTE — Progress Notes (Addendum)
Subjective:   Mary Howe is a 86 y.o. female who presents for Medicare Annual (Subsequent) preventive examination.  Review of Systems:  No ROS.  Medicare Wellness Virtual Visit.  Visual/audio telehealth visit, UTA vital signs.   Ht/Wt provided. See social history for additional risk factors.   Cardiac Risk Factors include: advanced age (>77men, >42 women);hypertension;diabetes mellitus     Objective:     Vitals: Ht 5\' 3"  (1.6 m)   Wt 133 lb (60.3 kg)   BMI 23.56 kg/m   Body mass index is 23.56 kg/m.  Advanced Directives 06/10/2019 06/07/2018 11/15/2017 10/10/2017 04/18/2017 01/12/2017 09/20/2016  Does Patient Have a Medical Advance Directive? No Yes Yes Yes Yes No Yes  Type of Advance Directive - Living will;Healthcare Power of [place];Living will Healthcare Power of [place];Living will - [place];Living will  Does patient want to make changes to medical advance directive? - No - Patient declined No - Patient declined No - Patient declined No - Patient declined - No - Patient declined  Copy of [place] in Chart? - No - copy requested No - copy requested Yes - - No - copy requested  Would patient like information on creating a medical advance directive? No - Patient declined - - - - No - Patient declined -    Tobacco Social History   Tobacco Use  Smoking Status Former Smoker  . Packs/day: 1.50  . Years: 29.00  . Pack years: 43.50  . Types: Cigarettes  . Quit date: 04/11/1988  . Years since quitting: 31.1  Smokeless Tobacco Never Used  Tobacco Comment   quit smoking in 1990     Counseling given: Not Answered Comment: quit smoking in 1990   Clinical Intake:  Pre-visit preparation completed: Yes        Diabetes: Yes(Followed by pcp)  How often do you need to have someone help you when you read instructions, pamphlets, or other written materials from your doctor or  pharmacy?: 1 - Never  Interpreter Needed?: No     Past Medical History:  Diagnosis Date  . CHF (congestive heart failure) ([place])   . COPD (chronic obstructive pulmonary disease) ([facility])    emphysema  . Dental bridge present    permanent, lower right  . Diverticulitis 2015  . Essential hypertension   . GERD (gastroesophageal reflux disease)   . Hyperlipidemia   . PAH (pulmonary artery hypertension) ([place])    a. 01/2017 Echo: PASP 40mmHg; b. 01/2017 RHC: mild PAH.  . Takotsubo cardiomyopathy    a. 01/2017 Echo: EF 25-30%, sev glob HK w/ basal regions moving well. Gr1 DD. mild MR, mildly dil LA, nl RV fxn, mild to mod TR, PASP 51mmHg; b. 01/2017 Cath: Nl cors w/ EF 35-45%. HK of distal segments suggestive of Takotsubo CM.   Marland Kitchen Thyroid disease   . Wears hearing aid in both ears    Past Surgical History:  Procedure Laterality Date  . CATARACT EXTRACTION W/PHACO Right 10/10/2017   Procedure: CATARACT EXTRACTION PHACO AND INTRAOCULAR LENS PLACEMENT ([place])  RIGHT;  Surgeon: Leandrew Koyanagi, MD;  Location: [facility];  Service: Ophthalmology;  Laterality: Right;  . CATARACT EXTRACTION W/PHACO Left 11/15/2017   Procedure: CATARACT EXTRACTION PHACO AND INTRAOCULAR LENS PLACEMENT ([place])  LEFT;  Surgeon: Leandrew Koyanagi, MD;  Location: [facility];  Service: Ophthalmology;  Laterality: Left;  . CERVICAL CONE BIOPSY    . CHOLECYSTECTOMY    .  HEMORROIDECTOMY    . RIGHT/LEFT HEART CATH AND CORONARY ANGIOGRAPHY N/A 01/16/2017   Procedure: RIGHT/LEFT HEART CATH AND CORONARY ANGIOGRAPHY;  Surgeon: Wellington Hampshire, MD;  Location: [facility];  Service: Cardiovascular;  Laterality: N/A;   Family History  Problem Relation Age of Onset  . Prostate cancer Father   . Pneumonia Brother   . Lung disease Brother   . CAD Brother   . Heart disease Brother   . CAD Brother   . Heart attack Brother   . Brain cancer Brother   . Bone cancer Sister   . Emphysema Sister   .  Fibrocystic breast disease Sister   . Aneurysm Sister   . Heart disease Sister   . Breast cancer Neg Hx    Social History   Socioeconomic History  . Marital status: Divorced    Spouse name: Not on file  . Number of children: Not on file  . Years of education: Not on file  . Highest education level: Not on file  Occupational History  . Occupation: retired  Tobacco Use  . Smoking status: Former Smoker    Packs/day: 1.50    Years: 29.00    Pack years: 43.50    Types: Cigarettes    Quit date: 04/11/1988    Years since quitting: 31.1  . Smokeless tobacco: Never Used  . Tobacco comment: quit smoking in 1990  Substance and Sexual Activity  . Alcohol use: Yes    Alcohol/week: 7.0 standard drinks    Types: 7 Glasses of wine per week    Comment:    . Drug use: No  . Sexual activity: Never  Other Topics Concern  . Not on file  Social History Narrative   Moved from [place] to [place] in 2014, where she lives with her 43 y/o sister.  Dtr and family nearby.       Social Determinants of Health   Financial Resource Strain:   . Difficulty of Paying Living Expenses: Not on file  Food Insecurity:   . Worried About Charity fundraiser in the Last Year: Not on file  . Ran Out of Food in the Last Year: Not on file  Transportation Needs:   . Lack of Transportation (Medical): Not on file  . Lack of Transportation (Non-Medical): Not on file  Physical Activity:   . Days of Exercise per Week: Not on file  . Minutes of Exercise per Session: Not on file  Stress:   . Feeling of Stress : Not on file  Social Connections: Unknown  . Frequency of Communication with Friends and Family: More than three times a week  . Frequency of Social Gatherings with Friends and Family: More than three times a week  . Attends Religious Services: Not on file  . Active Member of Clubs or Organizations: Not on file  . Attends Archivist Meetings: Not on file  . Marital Status: Not on file     Outpatient Encounter Medications as of 06/10/2019  Medication Sig  . acetaminophen (TYLENOL) 500 MG tablet Take 500 mg by mouth every 6 (six) hours as needed.  Marland Kitchen albuterol (PROVENTIL HFA;VENTOLIN HFA) 108 (90 Base) MCG/ACT inhaler Inhale 1-2 puffs into the lungs every 4 (four) hours as needed for wheezing or shortness of breath.  Marland Kitchen alendronate (FOSAMAX) 70 MG tablet Take 1 tablet (70 mg total) by mouth every 7 (seven) days. Take with a full glass of water on an empty stomach.  . ALPRAZolam Duanne Moron)  0.25 MG tablet Take 1 tablet (0.25 mg total) by mouth 2 (two) times daily as needed for anxiety.  . bisoprolol (ZEBETA) 5 MG tablet TAKE 1/2 TABLET(2.5 MG) BY MOUTH DAILY  . budesonide (PULMICORT) 0.5 MG/2ML nebulizer solution USE 1 VIAL VIA NEBULIZER TWICE DAILY  . calcium carbonate (OS-CAL) 600 MG TABS tablet Take 600 mg by mouth daily with breakfast.   . cetirizine (ZYRTEC) 10 MG tablet Take 1 tablet (10 mg total) by mouth daily.  . diphenhydrAMINE (BENADRYL) 25 MG tablet Take 25 mg by mouth at bedtime as needed.  Marland Kitchen escitalopram (LEXAPRO) 10 MG tablet Take 1 tablet (10 mg total) by mouth daily.  . furosemide (LASIX) 20 MG tablet Take 1 tablet (20 mg total) by mouth 2 (two) times daily.  . MULTIPLE VITAMIN PO Take 1 tablet by mouth daily.   . OXYGEN Inhale 2.5 L daily into the lungs.  . pantoprazole (PROTONIX) 40 MG tablet TAKE 1 TABLET(40 MG) BY MOUTH DAILY  . triamcinolone cream (KENALOG) 0.1 % Apply 1 application topically 2 (two) times daily.  . valACYclovir (VALTREX) 1000 MG tablet Take 1 tablet (1,000 mg total) by mouth 2 (two) times daily. For one week, then reduce use to once daily   No facility-administered encounter medications on file as of 06/10/2019.    Activities of Daily Living In your present state of health, do you have any difficulty performing the following activities: 06/10/2019  Hearing? Y  Comment Hearing aid  Vision? N  Difficulty concentrating or making decisions? N   Walking or climbing stairs? N  Dressing or bathing? N  Doing errands, shopping? N  Preparing Food and eating ? N  Using the Toilet? N  In the past six months, have you accidently leaked urine? N  Do you have problems with loss of bowel control? N  Managing your Medications? N  Managing your Finances? N  Housekeeping or managing your Housekeeping? N  Some recent data might be hidden    Patient Care Team: [name], MD as PCP - General (Internal Medicine)    Assessment:   This is a routine wellness examination for [place].  Nurse connected with patient 06/10/19 at  1:30 PM EST by a telephone enabled telemedicine application and verified that I am speaking with the correct person using two identifiers. Patient stated full name and DOB. Patient gave permission to continue with virtual visit. Patient's location was at home and Nurse's location was at [place] office.   Patient is alert and oriented x3. Patient denies difficulty focusing or concentrating. Patient likes to read, use the computer, knitting and crocheting for brain stimulation.   Health Maintenance Due: -Foot Exam- completed by pcp 06/10/19. -Hgb A1c- 11/01/18 (5.9). Labs drawn today.  -Tdap- plans to complete at local pharmacy; update pcp when complete.  -Mammogram- scheduled 07/04/19. See completed HM at the end of note.   Eye: Visual acuity not assessed. Virtual visit. Followed by their ophthalmologist.  Dental: Visits every 6 months.    Hearing: Hearing aids- yes  Safety:  Patient feels safe at home- yes Patient does have smoke detectors at home- yes Patient does wear sunscreen or protective clothing when in direct sunlight - yes Patient does wear seat belt when in a moving vehicle - yes Patient drives- yes Adequate lighting in walkways free from debris- yes Grab bars and handrails used as appropriate- yes Ambulates with an assistive device- no Cell phone on person when ambulating outside of the home-  yes  Social: Alcohol intake - yes      Smoking history- former Smokers in home? none Illicit drug use? none  Medication: Taking as directed and without issues.  Self managed - yes   Covid-19: Precautions and sickness symptoms discussed. Wears mask, social distancing, hand hygiene as appropriate.   Activities of Daily Living Patient denies needing assistance with: household chores, feeding themselves, getting from bed to chair, getting to the toilet, bathing/showering, dressing, managing money, or preparing meals.   Discussed the importance of a healthy diet, water intake and the benefits of aerobic exercise.   Physical activity- active around the home; stair climbing.  Diet:  Regular Water: good intake  Other Providers Patient Care Team: [name], MD as PCP - General (Internal Medicine)  Exercise Activities and Dietary recommendations Current Exercise Habits: Home exercise routine, Type of exercise: walking, Intensity: Mild  Goals    . Increase physical activity     Stay active and walk for exercise.         Fall Risk Fall Risk  06/10/2019 06/10/2019 06/07/2018 04/18/2017 09/20/2016  Falls in the past year? 0 0 0 No No  Number falls in past yr: - - - - -  Injury with Fall? - - - - -  Risk Factor Category  - - - - -  Risk for fall due to : - - - Impaired vision -  Risk for fall due to: Comment - - - wears glasses -  Follow up Falls evaluation completed Falls evaluation completed - - -   Timed Get Up and Go performed: no, virtual visit  Depression Screen PHQ 2/9 Scores 06/10/2019 09/07/2018 06/07/2018 07/10/2017  PHQ - 2 Score 0 0 0 0  PHQ- 9 Score - - - 0     Cognitive Function MMSE - Mini Mental State Exam 09/20/2016  Orientation to time 5  Orientation to Place 5  Registration 3  Attention/ Calculation 5  Recall 3  Language- name 2 objects 2  Language- repeat 1  Language- follow 3 step command 3  Language- read & follow direction 1  Write a sentence 1   Copy design 1  Total score 30     6CIT Screen 06/10/2019 06/07/2018  What Year? 0 points 0 points  What month? 0 points 0 points  What time? 0 points 0 points  Count back from 20 0 points 0 points  Months in reverse 0 points 0 points  Repeat phrase 0 points 0 points  Total Score 0 0    Immunization History  Administered Date(s) Administered  . Influenza, High Dose Seasonal PF 02/23/2015, 01/18/2016, 12/14/2016, 01/18/2018  . Influenza-Unspecified 01/23/2019  . PFIZER SARS-COV-2 Vaccination 05/03/2019, 05/24/2019  . Pneumococcal Conjugate-13 10/21/2013  . Pneumococcal Polysaccharide-23 05/04/2016  . Zoster 01/30/2012  . Zoster Recombinat (Shingrix) 10/30/2018, 01/08/2019   Screening Tests Health Maintenance  Topic Date Due  . HEMOGLOBIN A1C  05/04/2019  . MAMMOGRAM  05/30/2019  . TETANUS/TDAP  06/09/2020 (Originally 09/08/1951)  . OPHTHALMOLOGY EXAM  06/21/2019  . URINE MICROALBUMIN  11/01/2019  . FOOT EXAM  06/09/2020  . INFLUENZA VACCINE  Completed  . DEXA SCAN  Completed  . PNA vac Low Risk Adult  Completed      Plan:   Keep all routine maintenance appointments.   Medicare Attestation I have personally reviewed: The patient's medical and social history Their use of alcohol, tobacco or illicit drugs Their current medications and supplements The patient's functional ability including ADLs,fall risks, home safety  risks, cognitive, and hearing and visual impairment Diet and physical activities Evidence for depression   I have reviewed and discussed with patient certain preventive protocols, quality metrics, and best practice recommendations.     OBrien-Blaney, Marshall Roehrich L, LPN  [id number]    I have reviewed the above information and agree with above.   Deborra Medina, MD

## 2019-06-10 NOTE — Patient Instructions (Addendum)
  Mary Howe , Thank you for taking time to come for your Medicare Wellness Visit. I appreciate your ongoing commitment to your health goals. Please review the following plan we discussed and let me know if I can assist you in the future.   These are the goals we discussed: Goals    . Increase physical activity     Stay active and walk for exercise.         This is a list of the screening recommended for you and due dates:  Health Maintenance  Topic Date Due  . Hemoglobin A1C  05/04/2019  . Mammogram  05/30/2019  . Tetanus Vaccine  06/09/2020*  . Eye exam for diabetics  06/21/2019  . Urine Protein Check  11/01/2019  . Complete foot exam   06/09/2020  . Flu Shot  Completed  . DEXA scan (bone density measurement)  Completed  . Pneumonia vaccines  Completed  *Topic was postponed. The date shown is not the original due date.

## 2019-06-10 NOTE — Assessment & Plan Note (Signed)
Denies back pain currently.  Reviewed calcium and vitamin D needs,  And encouarged more regular use of alendronate

## 2019-06-10 NOTE — Assessment & Plan Note (Addendum)
Aggravated by health issues and overbearing sister. Improved with Lexapro at 5 mg daily and prn xanax. . The risks and benefits of benzodiazepine use were reviewedd with patient today including excessive sedation leading to respiratory depression,  impaired thinking/driving, and addiction.  Patient was advised to avoid concurrent use with alcohol, to use medication only as needed and not to share with others  .  

## 2019-06-11 ENCOUNTER — Other Ambulatory Visit: Payer: Self-pay | Admitting: Internal Medicine

## 2019-06-11 DIAGNOSIS — R7989 Other specified abnormal findings of blood chemistry: Secondary | ICD-10-CM

## 2019-06-24 ENCOUNTER — Telehealth: Payer: Self-pay | Admitting: Internal Medicine

## 2019-06-24 DIAGNOSIS — K219 Gastro-esophageal reflux disease without esophagitis: Secondary | ICD-10-CM

## 2019-06-24 MED ORDER — BISOPROLOL FUMARATE 5 MG PO TABS: ORAL_TABLET | ORAL | 1 refills | Status: DC

## 2019-06-24 MED ORDER — PANTOPRAZOLE SODIUM 40 MG PO TBEC: DELAYED_RELEASE_TABLET | ORAL | 1 refills | Status: DC

## 2019-06-24 NOTE — Telephone Encounter (Signed)
Refill request for, Bisoprolol last seen 06-10-19, last filled 12-27-18.  Please advise.

## 2019-06-24 NOTE — Addendum Note (Signed)
Addended by: [name] on: 06/24/2019 05:03 PM   Modules accepted: Orders

## 2019-06-24 NOTE — Telephone Encounter (Signed)
Pt needs refill called into [facility] for pantoprazole and bisoprolol. Pt is currently has 1 pill and pharmacy told her she needed to call us.

## 2019-06-24 NOTE — Telephone Encounter (Signed)
REFILLED,  DO NOT WAIT UNITL THE LAST MINTUE IN THE FUTURE

## 2019-06-25 DIAGNOSIS — H04123 Dry eye syndrome of bilateral lacrimal glands: Secondary | ICD-10-CM | POA: Diagnosis not present

## 2019-07-01 NOTE — Progress Notes (Signed)
Cardiology Office Note  Date:  07/02/2019   ID:  Mary Howe, DOB 1932-09-11, MRN [id number]  PCP:  [name], MD   Chief Complaint  Patient presents with  . office visit    12 monthF/U; Meds verbally reviewed with patient.    HPI:  86 y.o. female with a history of  COPD,  hypertension,  hyperlipidemia,  GERD,  mild pulmonary hypertension,  diagnosis of Takotsubo cardiomyopathy  Ef 60% in 01/2017, up from 25% who presents for follow-up of her cardiomyopathy  Lives upstairs from sister, some stress Active, does ADLs  accompanied by her daughter today. Walks, does yard work  No edema, some varicosities Glass of wine a day No chest pain, shortness of breath on exertion Lasix 20 daily Rarely extra Lasix after lunch  Lab work reviewed Trend down her potassium, not on supplement  Total Chol 183, LDL 78 HBA1C 5.9 CR 0.57 Potassium 3.5  EKG personally reviewed by myself on todays visit Shows normal sinus rhythm. 66 bpm.  No significant ST-T wave changes   OTHER PAST MEDICAL HISTORY REVIEWED BY ME FOR TODAY'S VISIT: Lots of stress, takes care of sister who is 39 Doing pulmonary rehab, exercising well, feels good  Follow-up echocardiogram 03/16/2017 with ejection fraction 55-60%. Using oxygen at nighttime alone  Echocardiogram on 01/12/2017 showed LV dysfunction with an EF of 25-30% with severe global hypokinesis. Troponin 0.54.   Diagnostic catheterization on 01/16/2017 revealed normal coronary arteries, normal right heart filling pressures, and mild pulmonary hypertension.  EF on ventriculography has improved some to 35-45%.   October 2018 admitted with acute on chronic hypoxic and hypercapnic respiratory failure in the setting of atypical pulmonary infection and AE COPD. Acute encephalopathy and sepsis with hypotension requiring vasopressors. She was discharged on low-dose bisoprolol and losartan. The losartan was discontinued in the setting of soft blood  pressures.  PMH:   has a past medical history of CHF (congestive heart failure) ([place]), COPD (chronic obstructive pulmonary disease) ([place]), Dental bridge present, Diverticulitis (2015), Essential hypertension, GERD (gastroesophageal reflux disease), Hyperlipidemia, PAH (pulmonary artery hypertension) ([place]), Takotsubo cardiomyopathy, Thyroid disease, and Wears hearing aid in both ears.  PSH:    Past Surgical History:  Procedure Laterality Date  . CATARACT EXTRACTION W/PHACO Right 10/10/2017   Procedure: CATARACT EXTRACTION PHACO AND INTRAOCULAR LENS PLACEMENT ([place])  RIGHT;  Surgeon: Leandrew Koyanagi, MD;  Location: [facility];  Service: Ophthalmology;  Laterality: Right;  . CATARACT EXTRACTION W/PHACO Left 11/15/2017   Procedure: CATARACT EXTRACTION PHACO AND INTRAOCULAR LENS PLACEMENT ([facility])  LEFT;  Surgeon: Leandrew Koyanagi, MD;  Location: [facility];  Service: Ophthalmology;  Laterality: Left;  . CERVICAL CONE BIOPSY    . CHOLECYSTECTOMY    . HEMORROIDECTOMY    . RIGHT/LEFT HEART CATH AND CORONARY ANGIOGRAPHY N/A 01/16/2017   Procedure: RIGHT/LEFT HEART CATH AND CORONARY ANGIOGRAPHY;  Surgeon: Wellington Hampshire, MD;  Location: [facility];  Service: Cardiovascular;  Laterality: N/A;    Current Outpatient Medications  Medication Sig Dispense Refill  . acetaminophen (TYLENOL) 500 MG tablet Take 500 mg by mouth every 6 (six) hours as needed.    Marland Kitchen albuterol (PROVENTIL HFA;VENTOLIN HFA) 108 (90 Base) MCG/ACT inhaler Inhale 1-2 puffs into the lungs every 4 (four) hours as needed for wheezing or shortness of breath. 1 Inhaler 10  . alendronate (FOSAMAX) 70 MG tablet Take 1 tablet (70 mg total) by mouth every 7 (seven) days. Take with a full glass of water on an empty stomach.  4 tablet 11  . ALPRAZolam (XANAX) 0.25 MG tablet Take 1 tablet (0.25 mg total) by mouth 2 (two) times daily as needed for anxiety. 20 tablet 0  . bisoprolol (ZEBETA) 5 MG tablet TAKE 1/2 TABLET(2.5  MG) BY MOUTH DAILY 45 tablet 1  . budesonide (PULMICORT) 0.5 MG/2ML nebulizer solution USE 1 VIAL VIA NEBULIZER TWICE DAILY 120 mL 11  . calcium carbonate (OS-CAL) 600 MG TABS tablet Take 600 mg by mouth daily with breakfast.     . cetirizine (ZYRTEC) 10 MG tablet Take 1 tablet (10 mg total) by mouth daily. 90 tablet 1  . diphenhydrAMINE (BENADRYL) 25 MG tablet Take 25 mg by mouth at bedtime as needed.    Marland Kitchen escitalopram (LEXAPRO) 10 MG tablet Take 1 tablet (10 mg total) by mouth daily. 90 tablet 1  . furosemide (LASIX) 20 MG tablet Take 1 tablet (20 mg total) by mouth 2 (two) times daily. 180 tablet 1  . MULTIPLE VITAMIN PO Take 1 tablet by mouth daily.     . OXYGEN Inhale 2.5 L daily into the lungs.    . pantoprazole (PROTONIX) 40 MG tablet TAKE 1 TABLET(40 MG) BY MOUTH DAILY 90 tablet 1  . triamcinolone cream (KENALOG) 0.1 % Apply 1 application topically 2 (two) times daily. 30 g 0  . valACYclovir (VALTREX) 1000 MG tablet Take 1 tablet (1,000 mg total) by mouth 2 (two) times daily. For one week, then reduce use to once daily (Patient taking differently: Take 1,000 mg by mouth daily as needed. For one week, then reduce use to once daily) 37 tablet 5   No current facility-administered medications for this visit.     Allergies:   Simvastatin   Social History:  The patient  reports that she quit smoking about 31 years ago. Her smoking use included cigarettes. She has a 43.50 pack-year smoking history. She has never used smokeless tobacco. She reports current alcohol use of about 7.0 standard drinks of alcohol per week. She reports that she does not use drugs.   Family History:   family history includes Aneurysm in her sister; Bone cancer in her sister; Brain cancer in her brother; CAD in her brother and brother; Emphysema in her sister; Fibrocystic breast disease in her sister; Heart attack in her brother; Heart disease in her brother and sister; Lung disease in her brother; Pneumonia in her  brother; Prostate cancer in her father.    Review of Systems: Review of Systems  Constitutional: Negative.   HENT: Negative.   Eyes: Negative.   Respiratory: Negative.   Cardiovascular: Negative.   Gastrointestinal: Negative.   Genitourinary: Negative.   Musculoskeletal: Negative.   Neurological: Negative.   Psychiatric/Behavioral: Negative.   All other systems reviewed and are negative.   PHYSICAL EXAM: VS:  BP 130/60 (BP Location: Left Arm, Patient Position: Sitting, Cuff Size: Normal)   Pulse 66   Ht 5\' 3"  (1.6 m)   Wt 137 lb 4 oz (62.3 kg)   SpO2 95%   BMI 24.31 kg/m  , BMI Body mass index is 24.31 kg/m. Constitutional:  oriented to person, place, and time. No distress.  HENT:  Head: Grossly normal Eyes:  no discharge. No scleral icterus.  Neck: No JVD, no carotid bruits  Cardiovascular: Regular rate and rhythm, no murmurs appreciated Pulmonary/Chest: Clear to auscultation bilaterally, no wheezes or rails Abdominal: Soft.  no distension.  no tenderness.  Musculoskeletal: Normal range of motion Neurological:  normal muscle tone. Coordination normal. No atrophy Skin:  Skin warm and dry Psychiatric: normal affect, pleasant  Recent Labs: 06/10/2019: ALT 13; BUN 6; Creatinine, Ser 0.57; Potassium 3.5; Sodium 136; TSH 5.18    Lipid Panel Lab Results  Component Value Date   CHOL 183 06/10/2019   HDL 88.90 06/10/2019   LDLCALC 78 06/10/2019   TRIG 84.0 06/10/2019    Wt Readings from Last 3 Encounters:  07/02/19 137 lb 4 oz (62.3 kg)  06/10/19 133 lb (60.3 kg)  06/10/19 133 lb 12.8 oz (60.7 kg)     ASSESSMENT AND PLAN:  NICM (nonischemic cardiomyopathy) ([place])  Previously felt secondary to secondary to stressors, pneumonia, COPD  2018 Typically takes Lasix daily, rarely twice a day No changes made, doing well  Essential hypertension Blood pressure is well controlled on today's visit. No changes made to the medications.  Centrilobular emphysema (H[facility]) Uses  Pulmicort nebulizer, does not like inhaler Feels her symptoms are stable  Lower extremity edema Lasix daily, occasionally extra Lasix in the afternoon  Hypercholesterolemia cardiac catheterization with no significant disease Currently not on a statin Numbers are stable  Diabetes type 2 A1c well controlled  Disposition:   F/U 12  Months   Total encounter time more than 25 minutes  Greater than 50% was spent in counseling and coordination of care with the patient    Orders Placed This Encounter  Procedures  . EKG 12-Lead     Signed, Esmond Plants, M.D., Ph.D. 07/02/2019  [place], [place]

## 2019-07-02 ENCOUNTER — Other Ambulatory Visit: Payer: Self-pay

## 2019-07-02 ENCOUNTER — Ambulatory Visit (INDEPENDENT_AMBULATORY_CARE_PROVIDER_SITE_OTHER): Payer: PPO | Admitting: Cardiovascular Disease

## 2019-07-02 VITALS — BP 130/60 | HR 66 | Ht 63.0 in | Wt 137.2 lb

## 2019-07-02 DIAGNOSIS — E7800 Pure hypercholesterolemia, unspecified: Secondary | ICD-10-CM

## 2019-07-02 DIAGNOSIS — I428 Other cardiomyopathies: Secondary | ICD-10-CM

## 2019-07-02 DIAGNOSIS — I10 Essential (primary) hypertension: Secondary | ICD-10-CM | POA: Diagnosis not present

## 2019-07-02 MED ORDER — FUROSEMIDE 20 MG PO TABS: 20.0000 mg | ORAL_TABLET | ORAL | 3 refills | Status: DC

## 2019-07-02 NOTE — Patient Instructions (Addendum)
Medication Instructions:  Lasix/furosemide 20 mg daily Extra 20 mg in the afternoon for weight gain  If you need a refill on your cardiac medications before your next appointment, please call your pharmacy.    Lab work: No new labs needed   If you have labs (blood work) drawn today and your tests are completely normal, you will receive your results only by: Marland Kitchen MyChart Message (if you have MyChart) OR . A paper copy in the mail If you have any lab test that is abnormal or we need to change your treatment, we will call you to review the results.   Testing/Procedures: No new testing needed   Follow-Up: At [facility], you and your health needs are our priority.  As part of our continuing mission to provide you with exceptional heart care, we have created designated Provider Care Teams.  These Care Teams include your primary Cardiologist (physician) and Advanced Practice Providers (APPs -  Physician Assistants and Nurse Practitioners) who all work together to provide you with the care you need, when you need it.  . You will need a follow up appointment in 12 months .  Marland Kitchen Providers on your designated Care Team:   . Murray Hodgkins, NP . Christell Faith, PA-C . Marrianne Mood, PA-C  Any Other Special Instructions Will Be Listed Below (If Applicable).  For educational health videos Log in to : [email or web address] Or : [email or web address], password : [email or web address]

## 2019-07-04 ENCOUNTER — Ambulatory Visit
Admission: RE | Admit: 2019-07-04 | Discharge: 2019-07-04 | Disposition: A | Discharge status: Home / Self Care | Payer: PPO | Source: Ambulatory Visit | Attending: Internal Medicine | Admitting: Internal Medicine

## 2019-07-04 DIAGNOSIS — Z1231 Encounter for screening mammogram for malignant neoplasm of breast: Secondary | ICD-10-CM | POA: Insufficient documentation

## 2019-08-20 DIAGNOSIS — J9601 Acute respiratory failure with hypoxia: Secondary | ICD-10-CM | POA: Diagnosis not present

## 2019-08-20 DIAGNOSIS — R062 Wheezing: Secondary | ICD-10-CM | POA: Diagnosis not present

## 2019-08-20 DIAGNOSIS — J449 Chronic obstructive pulmonary disease, unspecified: Secondary | ICD-10-CM | POA: Diagnosis not present

## 2019-08-27 ENCOUNTER — Other Ambulatory Visit (INDEPENDENT_AMBULATORY_CARE_PROVIDER_SITE_OTHER): Payer: PPO

## 2019-08-27 ENCOUNTER — Other Ambulatory Visit: Payer: Self-pay

## 2019-08-27 DIAGNOSIS — R7989 Other specified abnormal findings of blood chemistry: Secondary | ICD-10-CM | POA: Diagnosis not present

## 2019-08-28 LAB — THYROID PANEL WITH TSH
Free Thyroxine Index: 1.4 (ref 1.2–4.9)
T3 Uptake Ratio: 26 % (ref 24–39)
T4, Total: 5.3 ug/dL (ref 4.5–12.0)
TSH: 5.88 u[IU]/mL — ABNORMAL HIGH (ref 0.450–4.500)

## 2019-09-01 ENCOUNTER — Encounter: Payer: Self-pay | Admitting: Internal Medicine

## 2019-09-01 DIAGNOSIS — R062 Wheezing: Secondary | ICD-10-CM | POA: Diagnosis not present

## 2019-09-01 DIAGNOSIS — J449 Chronic obstructive pulmonary disease, unspecified: Secondary | ICD-10-CM | POA: Diagnosis not present

## 2019-09-01 DIAGNOSIS — J9601 Acute respiratory failure with hypoxia: Secondary | ICD-10-CM | POA: Diagnosis not present

## 2019-09-25 DIAGNOSIS — D2261 Melanocytic nevi of right upper limb, including shoulder: Secondary | ICD-10-CM | POA: Diagnosis not present

## 2019-09-25 DIAGNOSIS — D2271 Melanocytic nevi of right lower limb, including hip: Secondary | ICD-10-CM | POA: Diagnosis not present

## 2019-09-25 DIAGNOSIS — X32XXXA Exposure to sunlight, initial encounter: Secondary | ICD-10-CM | POA: Diagnosis not present

## 2019-09-25 DIAGNOSIS — L570 Actinic keratosis: Secondary | ICD-10-CM | POA: Diagnosis not present

## 2019-09-25 DIAGNOSIS — D485 Neoplasm of uncertain behavior of skin: Secondary | ICD-10-CM | POA: Diagnosis not present

## 2019-09-25 DIAGNOSIS — L940 Localized scleroderma [morphea]: Secondary | ICD-10-CM | POA: Diagnosis not present

## 2019-09-25 DIAGNOSIS — D2272 Melanocytic nevi of left lower limb, including hip: Secondary | ICD-10-CM | POA: Diagnosis not present

## 2019-09-25 DIAGNOSIS — L821 Other seborrheic keratosis: Secondary | ICD-10-CM | POA: Diagnosis not present

## 2019-09-25 DIAGNOSIS — D225 Melanocytic nevi of trunk: Secondary | ICD-10-CM | POA: Diagnosis not present

## 2019-09-25 DIAGNOSIS — M71341 Other bursal cyst, right hand: Secondary | ICD-10-CM | POA: Diagnosis not present

## 2019-09-25 DIAGNOSIS — L309 Dermatitis, unspecified: Secondary | ICD-10-CM | POA: Diagnosis not present

## 2019-09-25 DIAGNOSIS — D2262 Melanocytic nevi of left upper limb, including shoulder: Secondary | ICD-10-CM | POA: Diagnosis not present

## 2019-10-02 DIAGNOSIS — J9601 Acute respiratory failure with hypoxia: Secondary | ICD-10-CM | POA: Diagnosis not present

## 2019-10-02 DIAGNOSIS — J449 Chronic obstructive pulmonary disease, unspecified: Secondary | ICD-10-CM | POA: Diagnosis not present

## 2019-10-02 DIAGNOSIS — R062 Wheezing: Secondary | ICD-10-CM | POA: Diagnosis not present

## 2019-11-01 DIAGNOSIS — J449 Chronic obstructive pulmonary disease, unspecified: Secondary | ICD-10-CM | POA: Diagnosis not present

## 2019-11-01 DIAGNOSIS — J9601 Acute respiratory failure with hypoxia: Secondary | ICD-10-CM | POA: Diagnosis not present

## 2019-11-01 DIAGNOSIS — R062 Wheezing: Secondary | ICD-10-CM | POA: Diagnosis not present

## 2019-11-03 ENCOUNTER — Other Ambulatory Visit: Payer: Self-pay | Admitting: Internal Medicine

## 2019-11-03 DIAGNOSIS — L255 Unspecified contact dermatitis due to plants, except food: Secondary | ICD-10-CM | POA: Insufficient documentation

## 2019-11-03 DIAGNOSIS — L247 Irritant contact dermatitis due to plants, except food: Secondary | ICD-10-CM

## 2019-11-03 DIAGNOSIS — B029 Zoster without complications: Secondary | ICD-10-CM

## 2019-11-03 MED ORDER — TRIAMCINOLONE ACETONIDE 0.1 % EX CREA: 1.0000 "application " | TOPICAL_CREAM | Freq: Two times a day (BID) | CUTANEOUS | 1 refills | Status: DC

## 2019-11-03 NOTE — Assessment & Plan Note (Signed)
Renewing triamcinolone oinmtent for prn use.

## 2019-11-05 ENCOUNTER — Other Ambulatory Visit: Payer: Self-pay | Admitting: Internal Medicine

## 2019-11-05 DIAGNOSIS — B029 Zoster without complications: Secondary | ICD-10-CM

## 2019-11-05 MED ORDER — FAMCICLOVIR 500 MG PO TABS: 500.0000 mg | ORAL_TABLET | Freq: Three times a day (TID) | ORAL | 1 refills | Status: DC

## 2019-11-05 NOTE — Assessment & Plan Note (Signed)
Starting famciclovir for spreading vesicular rash on left side of body only.

## 2019-11-06 ENCOUNTER — Telehealth: Payer: Self-pay | Admitting: Internal Medicine

## 2019-11-06 NOTE — Telephone Encounter (Signed)
Spoke with pt's daughter and advised her that pt is going to need an appt with Dr. Derrel Nip but if the rash is spreading to the eyes then she needs to call her eye doctor to be seen by them today. Daughter stated that she will give them a call right now.

## 2019-11-06 NOTE — Telephone Encounter (Signed)
Pt daughter called and said that she received a message from Dr. Derrel Nip to schedule an appt ASAP  For Shingles that are spreading to her eyes

## 2019-11-07 ENCOUNTER — Encounter: Payer: Self-pay | Admitting: Internal Medicine

## 2019-11-07 ENCOUNTER — Other Ambulatory Visit: Payer: Self-pay

## 2019-11-07 ENCOUNTER — Ambulatory Visit (INDEPENDENT_AMBULATORY_CARE_PROVIDER_SITE_OTHER): Payer: PPO | Admitting: Internal Medicine

## 2019-11-07 DIAGNOSIS — B029 Zoster without complications: Secondary | ICD-10-CM

## 2019-11-07 DIAGNOSIS — L255 Unspecified contact dermatitis due to plants, except food: Secondary | ICD-10-CM | POA: Diagnosis not present

## 2019-11-07 DIAGNOSIS — L309 Dermatitis, unspecified: Secondary | ICD-10-CM | POA: Diagnosis not present

## 2019-11-07 MED ORDER — PREDNISONE 10 MG PO TABS: ORAL_TABLET | ORAL | 0 refills | Status: DC

## 2019-11-07 NOTE — Progress Notes (Signed)
Subjective:  Patient ID: Mary Howe, female    DOB: Jun 10, 1932  Age: 86 y.o. MRN: [id number]  CC: Diagnoses of Contact dermatitis due to plant and Herpes zoster without complication were pertinent to this visit.  HPI Mary Howe presents for evaluation and treatment of body rash  86 yr old female with COPD, Type 2 DM, controlled ,  History of shingles, presents with a rapidly spreading pruritic rash which with several flat red pruritic lesion on right side of neck 7 days ago.  Also developed high  And more recently left thigh   Also had a large red spot on left shin around the same time.  She was examined on Sunday and prescribed triamcinolone ointment since she had been trimmed some poison ivy in her yard several days prior to onset of rash.  Patient developed several blistering spots on her right medial thigh  4 days ago and famciclovir was added  On July 27 due to concern for shingles .. the rash continued to spread to right torso and minimally to the left medial thigh, so an appt was made for today to reevaluate.  She states however, that over the last 24 hours there has been no new lesions.  The rash on her torso is intensely pruritic.  She denies fevers.  Has no household pets. No new detergents,  Lotions or plants in house. No new medications or supplements.    Lives with her sister who is not /o rash   Outpatient Medications Prior to Visit  Medication Sig Dispense Refill   acetaminophen (TYLENOL) 500 MG tablet Take 500 mg by mouth every 6 (six) hours as needed.     albuterol (PROVENTIL HFA;VENTOLIN HFA) 108 (90 Base) MCG/ACT inhaler Inhale 1-2 puffs into the lungs every 4 (four) hours as needed for wheezing or shortness of breath. 1 Inhaler 10   alendronate (FOSAMAX) 70 MG tablet Take 1 tablet (70 mg total) by mouth every 7 (seven) days. Take with a full glass of water on an empty stomach. 4 tablet 11   ALPRAZolam (XANAX) 0.25 MG tablet Take 1 tablet (0.25 mg total) by mouth 2 (two)  times daily as needed for anxiety. 20 tablet 0   bisoprolol (ZEBETA) 5 MG tablet TAKE 1/2 TABLET(2.5 MG) BY MOUTH DAILY 45 tablet 1   budesonide (PULMICORT) 0.5 MG/2ML nebulizer solution USE 1 VIAL VIA NEBULIZER TWICE DAILY 120 mL 11   calcium carbonate (OS-CAL) 600 MG TABS tablet Take 600 mg by mouth daily with breakfast.      cetirizine (ZYRTEC) 10 MG tablet Take 1 tablet (10 mg total) by mouth daily. 90 tablet 1   diphenhydrAMINE (BENADRYL) 25 MG tablet Take 25 mg by mouth at bedtime as needed.     escitalopram (LEXAPRO) 10 MG tablet Take 1 tablet (10 mg total) by mouth daily. 90 tablet 1   famciclovir (FAMVIR) 500 MG tablet Take 1 tablet (500 mg total) by mouth 3 (three) times daily. For shingles 21 tablet 1   furosemide (LASIX) 20 MG tablet Take 1 tablet (20 mg total) by mouth as directed. Take 1 tablet daily (20 mg) with extra 1 pill (20 mg) in evening for swelling. 180 tablet 3   MULTIPLE VITAMIN PO Take 1 tablet by mouth daily.      OXYGEN Inhale 2.5 L daily into the lungs.     pantoprazole (PROTONIX) 40 MG tablet TAKE 1 TABLET(40 MG) BY MOUTH DAILY 90 tablet 1   triamcinolone cream (KENALOG) 0.1 % Apply  1 application topically 2 (two) times daily. For contact dermatitis 80 g 1   valACYclovir (VALTREX) 1000 MG tablet Take 1 tablet (1,000 mg total) by mouth 2 (two) times daily. For one week, then reduce use to once daily (Patient taking differently: Take 1,000 mg by mouth daily as needed. For one week, then reduce use to once daily) 37 tablet 5   No facility-administered medications prior to visit.    Review of Systems;  Patient denies headache, fevers, malaise, unintentional weight loss,  eye pain, sinus congestion and sinus pain, sore throat, dysphagia,  hemoptysis , cough, dyspnea, wheezing, chest pain, palpitations, orthopnea, edema, abdominal pain, nausea, melena, diarrhea, constipation, flank pain, dysuria, hematuria, urinary  Frequency, nocturia, numbness, tingling,  seizures,  Focal weakness, Loss of consciousness,  Tremor, insomnia, depression, anxiety, and suicidal ideation.      Objective:  BP (!) 132/80    Pulse 80    Temp 98.2 F (36.8 C)    Resp 16    Wt 130 lb 9.6 oz (59.2 kg)    SpO2 92%    BMI 23.13 kg/m   BP Readings from Last 3 Encounters:  11/07/19 (!) 132/80  07/02/19 130/60  06/10/19 108/66    Wt Readings from Last 3 Encounters:  11/07/19 130 lb 9.6 oz (59.2 kg)  07/02/19 137 lb 4 oz (62.3 kg)  06/10/19 133 lb (60.3 kg)    General appearance: alert, cooperative and appears stated age Ears: normal TM's and external ear canals both ears Throat: lips, mucosa, and tongue normal; teeth and gums normal Neck: no adenopathy, no carotid bruit, supple, symmetrical, trachea midline and thyroid not enlarged, symmetric, no tenderness/mass/nodules Back: symmetric, no curvature. ROM normal. No CVA tenderness. Lungs: clear to auscultation bilaterally Heart: regular rate and rhythm, S1, S2 normal, no murmur, click, rub or gallop Abdomen: soft, non-tender; bowel sounds normal; no masses,  no organomegaly Pulses: 2+ and symmetric Skin: right neck,  Right torso with confluent red raised flat rash .  One bulla noted on right medial thigh ras.h. left shin with tiny papules with surrounding erythema Lymph nodes: Cervical, supraclavicular, and axillary nodes normal.  Lab Results  Component Value Date   HGBA1C 5.9 06/10/2019   HGBA1C 5.9 11/01/2018   HGBA1C 6.1 04/30/2018    Lab Results  Component Value Date   CREATININE 0.57 06/10/2019   CREATININE 0.54 11/01/2018   CREATININE 0.66 04/30/2018    Lab Results  Component Value Date   WBC 11.1 (H) 01/16/2017   HGB 14.8 01/16/2017   HCT 43.0 01/16/2017   PLT 282 01/16/2017   GLUCOSE 109 (H) 06/10/2019   CHOL 183 06/10/2019   TRIG 84.0 06/10/2019   HDL 88.90 06/10/2019   LDLDIRECT 89.0 07/01/2016   LDLCALC 78 06/10/2019   ALT 13 06/10/2019   AST 22 06/10/2019   NA 136 06/10/2019   K  3.5 06/10/2019   CL 100 06/10/2019   CREATININE 0.57 06/10/2019   BUN 6 06/10/2019   CO2 26 06/10/2019   TSH 5.880 (H) 08/27/2019   INR 1.09 01/16/2017   HGBA1C 5.9 06/10/2019   MICROALBUR <0.7 11/01/2018    MM 3D SCREEN BREAST BILATERAL  Result Date: 07/04/2019 CLINICAL DATA:  Screening. EXAM: DIGITAL SCREENING BILATERAL MAMMOGRAM WITH TOMO AND CAD COMPARISON:  Previous exam(s). ACR Breast Density Category c: The breast tissue is heterogeneously dense, which may obscure small masses. FINDINGS: There are no findings suspicious for malignancy. Images were processed with CAD. IMPRESSION: No mammographic evidence of malignancy.  A result letter of this screening mammogram will be mailed directly to the patient. RECOMMENDATION: Screening mammogram in one year. (Code:SM-B-01Y) BI-RADS CATEGORY  1: Negative. Electronically Signed   By: Margarette Canada M.D.   On: 07/04/2019 15:05    Assessment & Plan:   Problem List Items Addressed This Visit      Unprioritized   Shingles outbreak    Unclear if current rash is due to contact dermatitis or disseminated shingles.  Treating for both      Contact dermatitis due to plant    Presumed to poison ivy,  But Adding prednisone taper .  Continue famciclovir.  increase zyrtec to qid for itching.  Call for development of fever  For empiric cellulitis coverage.          I am having Mary Howe start on predniSONE. I am also having her maintain her MULTIPLE VITAMIN PO, calcium carbonate, albuterol, diphenhydrAMINE, acetaminophen, OXYGEN, ALPRAZolam, budesonide, alendronate, cetirizine, valACYclovir, escitalopram, pantoprazole, bisoprolol, furosemide, triamcinolone cream, and famciclovir.  Meds ordered this encounter  Medications   predniSONE (DELTASONE) 10 MG tablet    Sig: 6 tablets daily for 3 days, then reduce by 1 tablet daily until gone    Dispense:  33 tablet    Refill:  0    There are no discontinued medications.  Follow-up: No follow-ups on  file.   [name], MD

## 2019-11-07 NOTE — Patient Instructions (Signed)
Increase zyrtec to 4 times daily  For the itchig   Continue the famciclovir  Start the prednisone 60 mg daily for 3 days,  Then taper by 10 mg daily until gone  Call me if you develop a fever

## 2019-11-07 NOTE — Assessment & Plan Note (Signed)
Unclear if current rash is due to contact dermatitis or disseminated shingles.  Treating for both

## 2019-11-07 NOTE — Assessment & Plan Note (Signed)
Presumed to poison ivy,  But Adding prednisone taper .  Continue famciclovir.  increase zyrtec to qid for itching.  Call for development of fever  For empiric cellulitis coverage.

## 2019-12-25 ENCOUNTER — Other Ambulatory Visit: Payer: Self-pay | Admitting: Internal Medicine

## 2020-01-06 ENCOUNTER — Telehealth: Payer: Self-pay | Admitting: Internal Medicine

## 2020-01-08 MED ORDER — BUDESONIDE 0.5 MG/2ML IN SUSP: RESPIRATORY_TRACT | 11 refills | Status: DC

## 2020-01-08 NOTE — Telephone Encounter (Signed)
Pt states that the pharmacy [facility] on [address] [address] [facility] [address] tells her that budesonide (PULMICORT) 0.5 MG/2ML nebulizer solution needs approval from PCP. Please send at your earliest convenience.

## 2020-01-08 NOTE — Addendum Note (Signed)
Addended by: Elpidio Galea T on: 01/08/2020 12:44 PM   Modules accepted: Orders

## 2020-01-13 ENCOUNTER — Ambulatory Visit: Payer: PPO | Attending: Internal Medicine

## 2020-01-13 DIAGNOSIS — Z23 Encounter for immunization: Secondary | ICD-10-CM

## 2020-01-13 NOTE — Progress Notes (Signed)
° °  Covid-19 Vaccination Clinic  Name:  Mary Howe    MRN: [id number] DOB: 02/26/1933  01/13/2020  Mary Howe was observed post Covid-19 immunization for 15 minutes without incident. She was provided with Vaccine Information Sheet and instruction to access the V-Safe system.   Mary Howe was instructed to call [phone] with any severe reactions post vaccine:  Difficulty breathing   Swelling of face and throat   A fast heartbeat   A bad rash all over body   Dizziness and weakness

## 2020-02-11 ENCOUNTER — Other Ambulatory Visit: Payer: Self-pay

## 2020-02-11 MED ORDER — ALENDRONATE SODIUM 70 MG PO TABS
70.0000 mg | ORAL_TABLET | ORAL | 11 refills | Status: DC
Start: 2020-02-11 — End: 2021-03-06

## 2020-06-09 ENCOUNTER — Other Ambulatory Visit: Payer: Self-pay

## 2020-06-09 DIAGNOSIS — K219 Gastro-esophageal reflux disease without esophagitis: Secondary | ICD-10-CM

## 2020-06-09 MED ORDER — ESCITALOPRAM OXALATE 10 MG PO TABS: ORAL_TABLET | ORAL | 1 refills | Status: DC

## 2020-06-09 MED ORDER — PANTOPRAZOLE SODIUM 40 MG PO TBEC: DELAYED_RELEASE_TABLET | ORAL | 1 refills | Status: DC

## 2020-06-10 ENCOUNTER — Ambulatory Visit (INDEPENDENT_AMBULATORY_CARE_PROVIDER_SITE_OTHER): Payer: Medicare HMO

## 2020-06-10 VITALS — Ht 63.0 in | Wt 130.0 lb

## 2020-06-10 DIAGNOSIS — Z0000 Encounter for general adult medical examination without abnormal findings: Secondary | ICD-10-CM | POA: Diagnosis not present

## 2020-06-10 NOTE — Progress Notes (Signed)
Subjective:   Apryll Hinkle is a 87 y.o. female who presents for Medicare Annual (Subsequent) preventive examination.  Review of Systems    No ROS.  Medicare Wellness Virtual Visit.     Cardiac Risk Factors include: advanced age (>22men, >5 women)     Objective:    Today's Vitals   06/10/20 1102  Weight: 130 lb (59 kg)  Height: 5\' 3"  (1.6 m)   Body mass index is 23.03 kg/m.  Advanced Directives 06/10/2020 06/10/2019 06/07/2018 11/15/2017 10/10/2017 04/18/2017 01/12/2017  Does Patient Have a Medical Advance Directive? Yes No Yes Yes Yes Yes No  Type of Paramedic of [place];Living will - Living will;Healthcare Power of [place];Living will Healthcare Power of [place];Living will -  Does patient want to make changes to medical advance directive? No - Patient declined - No - Patient declined No - Patient declined No - Patient declined No - Patient declined -  Copy of [place] in Chart? No - copy requested - No - copy requested No - copy requested Yes - -  Would patient like information on creating a medical advance directive? - No - Patient declined - - - - No - Patient declined    Current Medications (verified) Outpatient Encounter Medications as of 06/10/2020  Medication Sig   acetaminophen (TYLENOL) 500 MG tablet Take 500 mg by mouth every 6 (six) hours as needed.   albuterol (PROVENTIL HFA;VENTOLIN HFA) 108 (90 Base) MCG/ACT inhaler Inhale 1-2 puffs into the lungs every 4 (four) hours as needed for wheezing or shortness of breath.   alendronate (FOSAMAX) 70 MG tablet Take 1 tablet (70 mg total) by mouth every 7 (seven) days. Take with a full glass of water on an empty stomach.   ALPRAZolam (XANAX) 0.25 MG tablet Take 1 tablet (0.25 mg total) by mouth 2 (two) times daily as needed for anxiety.   bisoprolol (ZEBETA) 5 MG tablet TAKE 1/2 TABLET(2.5 MG) BY MOUTH DAILY   budesonide  (PULMICORT) 0.5 MG/2ML nebulizer solution USE 1 VIAL VIA NEBULIZER TWICE DAILY   calcium carbonate (OS-CAL) 600 MG TABS tablet Take 600 mg by mouth daily with breakfast.    cetirizine (ZYRTEC) 10 MG tablet Take 1 tablet (10 mg total) by mouth daily.   diphenhydrAMINE (BENADRYL) 25 MG tablet Take 25 mg by mouth at bedtime as needed.   escitalopram (LEXAPRO) 10 MG tablet TAKE 1 TABLET(10 MG) BY MOUTH DAILY   famciclovir (FAMVIR) 500 MG tablet Take 1 tablet (500 mg total) by mouth 3 (three) times daily. For shingles   FLUZONE HIGH-DOSE QUADRIVALENT 0.7 ML SUSY    furosemide (LASIX) 20 MG tablet Take 1 tablet (20 mg total) by mouth as directed. Take 1 tablet daily (20 mg) with extra 1 pill (20 mg) in evening for swelling.   MULTIPLE VITAMIN PO Take 1 tablet by mouth daily.    OXYGEN Inhale 2.5 L daily into the lungs.   pantoprazole (PROTONIX) 40 MG tablet TAKE 1 TABLET(40 MG) BY MOUTH DAILY   predniSONE (DELTASONE) 10 MG tablet 6 tablets daily for 3 days, then reduce by 1 tablet daily until gone   triamcinolone cream (KENALOG) 0.1 % Apply 1 application topically 2 (two) times daily. For contact dermatitis   valACYclovir (VALTREX) 1000 MG tablet Take 1 tablet (1,000 mg total) by mouth 2 (two) times daily. For one week, then reduce use to once daily (Patient taking differently: Take 1,000 mg by  mouth daily as needed. For one week, then reduce use to once daily)   No facility-administered encounter medications on file as of 06/10/2020.    Allergies (verified) Simvastatin   History: Past Medical History:  Diagnosis Date   CHF (congestive heart failure) (HCC)    COPD (chronic obstructive pulmonary disease) ([place])    emphysema   Dental bridge present    permanent, lower right   Diverticulitis 2015   Essential hypertension    GERD (gastroesophageal reflux disease)    Hyperlipidemia    PAH (pulmonary artery hypertension) ([place])    a. 01/2017 Echo: PASP 31mmHg; b. 01/2017 RHC:  mild PAH.   Takotsubo cardiomyopathy    a. 01/2017 Echo: EF 25-30%, sev glob HK w/ basal regions moving well. Gr1 DD. mild MR, mildly dil LA, nl RV fxn, mild to mod TR, PASP 52mmHg; b. 01/2017 Cath: Nl cors w/ EF 35-45%. HK of distal segments suggestive of Takotsubo CM.    Thyroid disease    Wears hearing aid in both ears    Past Surgical History:  Procedure Laterality Date   CATARACT EXTRACTION W/PHACO Right 10/10/2017   Procedure: CATARACT EXTRACTION PHACO AND INTRAOCULAR LENS PLACEMENT ([place])  RIGHT;  Surgeon: Leandrew Koyanagi, MD;  Location: [facility];  Service: Ophthalmology;  Laterality: Right;   CATARACT EXTRACTION W/PHACO Left 11/15/2017   Procedure: CATARACT EXTRACTION PHACO AND INTRAOCULAR LENS PLACEMENT ([place])  LEFT;  Surgeon: Leandrew Koyanagi, MD;  Location: [facility];  Service: Ophthalmology;  Laterality: Left;   CERVICAL CONE BIOPSY     CHOLECYSTECTOMY     HEMORROIDECTOMY     RIGHT/LEFT HEART CATH AND CORONARY ANGIOGRAPHY N/A 01/16/2017   Procedure: RIGHT/LEFT HEART CATH AND CORONARY ANGIOGRAPHY;  Surgeon: Wellington Hampshire, MD;  Location: [facility];  Service: Cardiovascular;  Laterality: N/A;   Family History  Problem Relation Age of Onset   Prostate cancer Father    Pneumonia Brother    Lung disease Brother    CAD Brother    Heart disease Brother    CAD Brother    Heart attack Brother    Brain cancer Brother    Bone cancer Sister    Emphysema Sister    Fibrocystic breast disease Sister    Aneurysm Sister    Heart disease Sister    Breast cancer Neg Hx    Social History   Socioeconomic History   Marital status: Divorced    Spouse name: Not on file   Number of children: Not on file   Years of education: Not on file   Highest education level: Not on file  Occupational History   Occupation: retired  Tobacco Use   Smoking status: Former Smoker    Packs/day: 1.50    Years: 29.00    Pack years:  43.50    Types: Cigarettes    Quit date: 04/11/1988    Years since quitting: 32.1   Smokeless tobacco: Never Used   Tobacco comment: quit smoking in 1990  Vaping Use   Vaping Use: Never used  Substance and Sexual Activity   Alcohol use: Yes    Alcohol/week: 7.0 standard drinks    Types: 7 Glasses of wine per week    Comment:     Drug use: No   Sexual activity: Never  Other Topics Concern   Not on file  Social History Narrative   Moved from [place] to [place] in 2014, where she lives with her 50 y/o sister.  Dtr and family  nearby.       Social Determinants of Health   Financial Resource Strain: Low Risk    Difficulty of Paying Living Expenses: Not hard at all  Food Insecurity: No Food Insecurity   Worried About Charity fundraiser in the Last Year: Never true   Freedom in the Last Year: Never true  Transportation Needs: No Transportation Needs   Lack of Transportation (Medical): No   Lack of Transportation (Non-Medical): No  Physical Activity: Insufficiently Active   Days of Exercise per Week: 3 days   Minutes of Exercise per Session: 30 min  Stress: No Stress Concern Present   Feeling of Stress : Not at all  Social Connections: Unknown   Frequency of Communication with Friends and Family: More than three times a week   Frequency of Social Gatherings with Friends and Family: More than three times a week   Attends Religious Services: Not on Electrical engineer or Organizations: Not on file   Attends Archivist Meetings: Not on file   Marital Status: Not on file    Tobacco Counseling Counseling given: Not Answered Comment: quit smoking in 1990   Clinical Intake:  Pre-visit preparation completed: Yes        Diabetes: No  How often do you need to have someone help you when you read instructions, pamphlets, or other written materials from your doctor or pharmacy?: 1 - Never   Interpreter Needed?: No       Activities of Daily Living In your present state of health, do you have any difficulty performing the following activities: 06/10/2020  Hearing? Y  Comment Hearing aids  Vision? N  Difficulty concentrating or making decisions? N  Walking or climbing stairs? N  Dressing or bathing? N  Doing errands, shopping? N  Preparing Food and eating ? N  Using the Toilet? N  In the past six months, have you accidently leaked urine? N  Do you have problems with loss of bowel control? N  Managing your Medications? N  Managing your Finances? N  Housekeeping or managing your Housekeeping? N  Some recent data might be hidden    Patient Care Team: [name], MD as PCP - General (Internal Medicine)  Indicate any recent Medical Services you may have received from other than [facility] providers in the past year (date may be approximate).     Assessment:   This is a routine wellness examination for [place].  I connected with Aryka today by telephone and verified that I am speaking with the correct person using two identifiers. Location patient: home Location provider: work Persons participating in the virtual visit: patient, Marine scientist.    I discussed the limitations, risks, security and privacy concerns of performing an evaluation and management service by telephone and the availability of in person appointments. The patient expressed understanding and verbally consented to this telephonic visit.    Interactive audio and video telecommunications were attempted between this provider and patient, however failed, due to patient having technical difficulties OR patient did not have access to video capability.  We continued and completed visit with audio only.  Some vital signs may be absent or patient reported.   Hearing/Vision screen  Hearing Screening   125Hz  250Hz  500Hz  1000Hz  2000Hz  3000Hz  4000Hz  6000Hz  8000Hz   Right ear:           Left ear:           Comments: Hearing aid  Vision Screening  Comments: Wears corrective lenses  Cataract extraction, bilateral  Visual acuity not assessed, virtual visit. They have seen their ophthalmologist.   Dietary issues and exercise activities discussed: Current Exercise Habits: Home exercise routine, Type of exercise: walking, Intensity: Mild  Healthy diet Good water intake  Goals     Increase physical activity     Stay active and walk for exercise.        Depression Screen PHQ 2/9 Scores 06/10/2020 06/10/2019 09/07/2018 06/07/2018 07/10/2017 05/29/2017 04/18/2017  PHQ - 2 Score 0 0 0 0 0 0 0  PHQ- 9 Score - - - - 0 0 0    Fall Risk Fall Risk  06/10/2020 06/10/2019 06/10/2019 06/07/2018 04/18/2017  Falls in the past year? 0 0 0 0 No  Number falls in past yr: 0 - - - -  Injury with Fall? 0 - - - -  Risk Factor Category  - - - - -  Risk for fall due to : - - - - Impaired vision  Risk for fall due to: Comment - - - - wears glasses  Follow up Falls evaluation completed Falls evaluation completed Falls evaluation completed - -    FALL RISK PREVENTION PERTAINING TO THE HOME: Handrails in use when climbing stairs? Yes Home free of loose throw rugs in walkways, pet beds, electrical cords, etc? Yes  Adequate lighting in your home to reduce risk of falls? Yes   ASSISTIVE DEVICES UTILIZED TO PREVENT FALLS: Life alert? No  Use of a cane, walker or w/c? No  Grab bars in the bathroom? Yes  Shower chair or bench in shower? Yes  Elevated toilet seat or a handicapped toilet? No    Oxygen- in use bedtime and as needed.  TIMED UP AND GO: Was the test performed? No . Virtual visit.   Cognitive Function: Patient is alert and oriented x3.  Denies difficulty focusing, making decisions, memory loss.  Enjoys reading and hand work.  Normal by direct by communication/observation.   MMSE - Mini Mental State Exam 09/20/2016  Orientation to time 5  Orientation to Place 5  Registration 3  Attention/ Calculation 5  Recall 3  Language- name 2 objects 2   Language- repeat 1  Language- follow 3 step command 3  Language- read & follow direction 1  Write a sentence 1  Copy design 1  Total score 30     6CIT Screen 06/10/2020 06/10/2019 06/07/2018  What Year? 0 points 0 points 0 points  What month? 0 points 0 points 0 points  What time? 0 points 0 points 0 points  Count back from 20 - 0 points 0 points  Months in reverse 0 points 0 points 0 points  Repeat phrase - 0 points 0 points  Total Score - 0 0    Immunizations Immunization History  Administered Date(s) Administered   Fluad Quad(high Dose 65+) 01/24/2020   Influenza, High Dose Seasonal PF 02/23/2015, 01/18/2016, 12/14/2016, 01/18/2018   Influenza-Unspecified 01/23/2019   PFIZER(Purple Top)SARS-COV-2 Vaccination 05/03/2019, 05/24/2019, 01/13/2020   Pneumococcal Conjugate-13 10/21/2013   Pneumococcal Polysaccharide-23 05/04/2016   Tdap 07/22/2019   Zoster 01/30/2012   Zoster Recombinat (Shingrix) 10/30/2018, 01/08/2019    Health Maintenance Health Maintenance  Topic Date Due   URINE MICROALBUMIN  11/01/2019   HEMOGLOBIN A1C  12/11/2019   FOOT EXAM  06/09/2020   OPHTHALMOLOGY EXAM  06/25/2020   MAMMOGRAM  07/03/2020   TETANUS/TDAP  07/21/2029   INFLUENZA VACCINE  Completed   DEXA  SCAN  Completed   COVID-19 Vaccine  Completed   PNA vac Low Risk Adult  Completed   HPV VACCINES  Aged Out   Colorectal cancer screening: No longer required.   Mammogram status: Completed 07/04/19. Repeat every year. Ordered.   Lung Cancer Screening: (Low Dose CT Chest recommended if Age 28-80 years, 30 pack-year currently smoking OR have quit w/in 15years.) does not qualify.   Hepatitis C Screening: does not qualify.  Vision Screening: Recommended annual ophthalmology exams for early detection of glaucoma and other disorders of the eye. Is the patient up to date with their annual eye exam?  Yes  Who is the provider or what is the name of the office in which the patient  attends annual eye exams? [facility]  Dental Screening: Recommended annual dental exams for proper oral hygiene. Visits every 6 months.   Community Resource Referral / Chronic Care Management: CRR required this visit?  No   CCM required this visit?  No      Plan:   Keep all routine maintenance appointments.   Follow up 07/11/19 @ 9:30.  I have personally reviewed and noted the following in the patients chart:    Medical and social history  Use of alcohol, tobacco or illicit drugs   Current medications and supplements  Functional ability and status  Nutritional status  Physical activity  Advanced directives  List of other physicians  Hospitalizations, surgeries, and ER visits in previous 12 months  Vitals  Screenings to include cognitive, depression, and falls  Referrals and appointments  In addition, I have reviewed and discussed with patient certain preventive protocols, quality metrics, and best practice recommendations. A written personalized care plan for preventive services as well as general preventive health recommendations were provided to patient via mychart.     Varney Biles, LPN   06/15/4825

## 2020-06-10 NOTE — Patient Instructions (Addendum)
Mary Howe , Thank you for taking time to come for your Medicare Wellness Visit. I appreciate your ongoing commitment to your health goals. Please review the following plan we discussed and let me know if I can assist you in the future.   These are the goals we discussed: Goals    . Increase physical activity     Stay active and walk for exercise.         This is a list of the screening recommended for you and due dates:  Health Maintenance  Topic Date Due  . Urine Protein Check  11/01/2019  . Hemoglobin A1C  12/11/2019  . Complete foot exam   06/09/2020  . Eye exam for diabetics  06/25/2020  . Mammogram  07/03/2020  . Tetanus Vaccine  07/21/2029  . Flu Shot  Completed  . DEXA scan (bone density measurement)  Completed  . COVID-19 Vaccine  Completed  . Pneumonia vaccines  Completed  . HPV Vaccine  Aged Out   Keep all routine maintenance appointments.   Follow up 07/11/19 @ 9:30.  Advanced directives: End of life planning; Advance aging; Advanced directives discussed.  Copy of current HCPOA/Living Will requested.    Conditions/risks identified: none new  Follow up in one year for your annual wellness visit    Preventive Care 65 Years and Older, Female Preventive care refers to lifestyle choices and visits with your health care provider that can promote health and wellness. What does preventive care include?  A yearly physical exam. This is also called an annual well check.  Dental exams once or twice a year.  Routine eye exams. Ask your health care provider how often you should have your eyes checked.  Personal lifestyle choices, including:  Daily care of your teeth and gums.  Regular physical activity.  Eating a healthy diet.  Avoiding tobacco and drug use.  Limiting alcohol use.  Practicing safe sex.  Taking low-dose aspirin every day.  Taking vitamin and mineral supplements as recommended by your health care provider. What happens during an annual well  check? The services and screenings done by your health care provider during your annual well check will depend on your age, overall health, lifestyle risk factors, and family history of disease. Counseling  Your health care provider may ask you questions about your:  Alcohol use.  Tobacco use.  Drug use.  Emotional well-being.  Home and relationship well-being.  Sexual activity.  Eating habits.  History of falls.  Memory and ability to understand (cognition).  Work and work Statistician.  Reproductive health. Screening  You may have the following tests or measurements:  Height, weight, and BMI.  Blood pressure.  Lipid and cholesterol levels. These may be checked every 5 years, or more frequently if you are over 53 years old.  Skin check.  Lung cancer screening. You may have this screening every year starting at age 71 if you have a 30-pack-year history of smoking and currently smoke or have quit within the past 15 years.  Fecal occult blood test (FOBT) of the stool. You may have this test every year starting at age 42.  Flexible sigmoidoscopy or colonoscopy. You may have a sigmoidoscopy every 5 years or a colonoscopy every 10 years starting at age 30.  Hepatitis C blood test.  Hepatitis B blood test.  Sexually transmitted disease (STD) testing.  Diabetes screening. This is done by checking your blood sugar (glucose) after you have not eaten for a while (fasting). You may  have this done every 1-3 years.  Bone density scan. This is done to screen for osteoporosis. You may have this done starting at age 71.  Mammogram. This may be done every 1-2 years. Talk to your health care provider about how often you should have regular mammograms. Talk with your health care provider about your test results, treatment options, and if necessary, the need for more tests. Vaccines  Your health care provider may recommend certain vaccines, such as:  Influenza vaccine. This is  recommended every year.  Tetanus, diphtheria, and acellular pertussis (Tdap, Td) vaccine. You may need a Td booster every 10 years.  Zoster vaccine. You may need this after age 46.  Pneumococcal 13-valent conjugate (PCV13) vaccine. One dose is recommended after age 78.  Pneumococcal polysaccharide (PPSV23) vaccine. One dose is recommended after age 73. Talk to your health care provider about which screenings and vaccines you need and how often you need them. This information is not intended to replace advice given to you by your health care provider. Make sure you discuss any questions you have with your health care provider. Document Released: 04/24/2015 Document Revised: 12/16/2015 Document Reviewed: 01/27/2015 Elsevier Interactive Patient Education  2017 [place] Prevention in the Home Falls can cause injuries. They can happen to people of all ages. There are many things you can do to make your home safe and to help prevent falls. What can I do on the outside of my home?  Regularly fix the edges of walkways and driveways and fix any cracks.  Remove anything that might make you trip as you walk through a door, such as a raised step or threshold.  Trim any bushes or trees on the path to your home.  Use bright outdoor lighting.  Clear any walking paths of anything that might make someone trip, such as rocks or tools.  Regularly check to see if handrails are loose or broken. Make sure that both sides of any steps have handrails.  Any raised decks and porches should have guardrails on the edges.  Have any leaves, snow, or ice cleared regularly.  Use sand or salt on walking paths during winter.  Clean up any spills in your garage right away. This includes oil or grease spills. What can I do in the bathroom?  Use night lights.  Install grab bars by the toilet and in the tub and shower. Do not use towel bars as grab bars.  Use non-skid mats or decals in the tub or  shower.  If you need to sit down in the shower, use a plastic, non-slip stool.  Keep the floor dry. Clean up any water that spills on the floor as soon as it happens.  Remove soap buildup in the tub or shower regularly.  Attach bath mats securely with double-sided non-slip rug tape.  Do not have throw rugs and other things on the floor that can make you trip. What can I do in the bedroom?  Use night lights.  Make sure that you have a light by your bed that is easy to reach.  Do not use any sheets or blankets that are too big for your bed. They should not hang down onto the floor.  Have a firm chair that has side arms. You can use this for support while you get dressed.  Do not have throw rugs and other things on the floor that can make you trip. What can I do in the kitchen?  Clean up  any spills right away.  Avoid walking on wet floors.  Keep items that you use a lot in easy-to-reach places.  If you need to reach something above you, use a strong step stool that has a grab bar.  Keep electrical cords out of the way.  Do not use floor polish or wax that makes floors slippery. If you must use wax, use non-skid floor wax.  Do not have throw rugs and other things on the floor that can make you trip. What can I do with my stairs?  Do not leave any items on the stairs.  Make sure that there are handrails on both sides of the stairs and use them. Fix handrails that are broken or loose. Make sure that handrails are as long as the stairways.  Check any carpeting to make sure that it is firmly attached to the stairs. Fix any carpet that is loose or worn.  Avoid having throw rugs at the top or bottom of the stairs. If you do have throw rugs, attach them to the floor with carpet tape.  Make sure that you have a light switch at the top of the stairs and the bottom of the stairs. If you do not have them, ask someone to add them for you. What else can I do to help prevent  falls?  Wear shoes that:  Do not have high heels.  Have rubber bottoms.  Are comfortable and fit you well.  Are closed at the toe. Do not wear sandals.  If you use a stepladder:  Make sure that it is fully opened. Do not climb a closed stepladder.  Make sure that both sides of the stepladder are locked into place.  Ask someone to hold it for you, if possible.  Clearly mark and make sure that you can see:  Any grab bars or handrails.  First and last steps.  Where the edge of each step is.  Use tools that help you move around (mobility aids) if they are needed. These include:  Canes.  Walkers.  Scooters.  Crutches.  Turn on the lights when you go into a dark area. Replace any light bulbs as soon as they burn out.  Set up your furniture so you have a clear path. Avoid moving your furniture around.  If any of your floors are uneven, fix them.  If there are any pets around you, be aware of where they are.  Review your medicines with your doctor. Some medicines can make you feel dizzy. This can increase your chance of falling. Ask your doctor what other things that you can do to help prevent falls. This information is not intended to replace advice given to you by your health care provider. Make sure you discuss any questions you have with your health care provider. Document Released: 01/22/2009 Document Revised: 09/03/2015 Document Reviewed: 05/02/2014 Elsevier Interactive Patient Education  2017 [employer].

## 2020-06-25 DIAGNOSIS — H353131 Nonexudative age-related macular degeneration, bilateral, early dry stage: Secondary | ICD-10-CM | POA: Diagnosis not present

## 2020-06-25 DIAGNOSIS — Z0100 Encounter for examination of eyes and vision without abnormal findings: Secondary | ICD-10-CM | POA: Diagnosis not present

## 2020-07-10 ENCOUNTER — Encounter: Payer: Self-pay | Admitting: Internal Medicine

## 2020-07-10 ENCOUNTER — Other Ambulatory Visit: Payer: Self-pay

## 2020-07-10 ENCOUNTER — Ambulatory Visit (INDEPENDENT_AMBULATORY_CARE_PROVIDER_SITE_OTHER): Payer: Medicare HMO | Admitting: Internal Medicine

## 2020-07-10 VITALS — BP 136/68 | HR 75 | Temp 98.1°F | Resp 16 | Ht 63.0 in | Wt 136.6 lb

## 2020-07-10 DIAGNOSIS — E785 Hyperlipidemia, unspecified: Secondary | ICD-10-CM

## 2020-07-10 DIAGNOSIS — E1159 Type 2 diabetes mellitus with other circulatory complications: Secondary | ICD-10-CM | POA: Diagnosis not present

## 2020-07-10 DIAGNOSIS — I428 Other cardiomyopathies: Secondary | ICD-10-CM

## 2020-07-10 DIAGNOSIS — T466X5A Adverse effect of antihyperlipidemic and antiarteriosclerotic drugs, initial encounter: Secondary | ICD-10-CM

## 2020-07-10 DIAGNOSIS — E1169 Type 2 diabetes mellitus with other specified complication: Secondary | ICD-10-CM

## 2020-07-10 DIAGNOSIS — I152 Hypertension secondary to endocrine disorders: Secondary | ICD-10-CM

## 2020-07-10 DIAGNOSIS — M791 Myalgia, unspecified site: Secondary | ICD-10-CM

## 2020-07-10 DIAGNOSIS — E119 Type 2 diabetes mellitus without complications: Secondary | ICD-10-CM

## 2020-07-10 DIAGNOSIS — F419 Anxiety disorder, unspecified: Secondary | ICD-10-CM | POA: Diagnosis not present

## 2020-07-10 DIAGNOSIS — E559 Vitamin D deficiency, unspecified: Secondary | ICD-10-CM | POA: Diagnosis not present

## 2020-07-10 DIAGNOSIS — E039 Hypothyroidism, unspecified: Secondary | ICD-10-CM

## 2020-07-10 DIAGNOSIS — I10 Essential (primary) hypertension: Secondary | ICD-10-CM

## 2020-07-10 DIAGNOSIS — M810 Age-related osteoporosis without current pathological fracture: Secondary | ICD-10-CM

## 2020-07-10 DIAGNOSIS — G4734 Idiopathic sleep related nonobstructive alveolar hypoventilation: Secondary | ICD-10-CM

## 2020-07-10 DIAGNOSIS — E7800 Pure hypercholesterolemia, unspecified: Secondary | ICD-10-CM

## 2020-07-10 DIAGNOSIS — R5383 Other fatigue: Secondary | ICD-10-CM

## 2020-07-10 LAB — LIPID PANEL
Cholesterol: 222 mg/dL — ABNORMAL HIGH (ref 0–200)
HDL: 100.1 mg/dL (ref >39.00)
LDL Cholesterol: 105 mg/dL — ABNORMAL HIGH (ref 0–99)
NonHDL: 122.16
Total CHOL/HDL Ratio: 2
Triglycerides: 88 mg/dL (ref 0.0–149.0)
VLDL: 17.6 mg/dL (ref 0.0–40.0)

## 2020-07-10 LAB — MICROALBUMIN / CREATININE URINE RATIO
Creatinine,U: 15.5 mg/dL
Microalb Creat Ratio: 4.5 mg/g (ref 0.0–30.0)
Microalb, Ur: <0.7 mg/dL (ref 0.0–1.9)

## 2020-07-10 LAB — COMPREHENSIVE METABOLIC PANEL
ALT: 16 U/L (ref 0–35)
AST: 23 U/L (ref 0–37)
Albumin: 4.3 g/dL (ref 3.5–5.2)
Alkaline Phosphatase: 51 U/L (ref 39–117)
BUN: 12 mg/dL (ref 6–23)
CO2: 29 mEq/L (ref 19–32)
Calcium: 9.5 mg/dL (ref 8.4–10.5)
Chloride: 96 mEq/L (ref 96–112)
Creatinine, Ser: 0.6 mg/dL (ref 0.40–1.20)
GFR: 81.61 mL/min (ref >60.00)
Glucose, Bld: 106 mg/dL — ABNORMAL HIGH (ref 70–99)
Potassium: 4.1 mEq/L (ref 3.5–5.1)
Sodium: 134 mEq/L — ABNORMAL LOW (ref 135–145)
Total Bilirubin: 0.7 mg/dL (ref 0.2–1.2)
Total Protein: 6.9 g/dL (ref 6.0–8.3)

## 2020-07-10 LAB — CBC WITH DIFFERENTIAL/PLATELET
Basophils Absolute: 0.1 10*3/uL (ref 0.0–0.1)
Basophils Relative: 1 % (ref 0.0–3.0)
Eosinophils Absolute: 0.1 10*3/uL (ref 0.0–0.7)
Eosinophils Relative: 1.2 % (ref 0.0–5.0)
HCT: 41.1 % (ref 36.0–46.0)
Hemoglobin: 14.2 g/dL (ref 12.0–15.0)
Lymphocytes Relative: 28.8 % (ref 12.0–46.0)
Lymphs Abs: 1.6 10*3/uL (ref 0.7–4.0)
MCHC: 34.4 g/dL (ref 30.0–36.0)
MCV: 97.5 fl (ref 78.0–100.0)
Monocytes Absolute: 0.5 10*3/uL (ref 0.1–1.0)
Monocytes Relative: 8.9 % (ref 3.0–12.0)
Neutro Abs: 3.3 10*3/uL (ref 1.4–7.7)
Neutrophils Relative %: 60.1 % (ref 43.0–77.0)
Platelets: 218 10*3/uL (ref 150.0–400.0)
RBC: 4.22 Mil/uL (ref 3.87–5.11)
RDW: 13.8 % (ref 11.5–15.5)
WBC: 5.4 10*3/uL (ref 4.0–10.5)

## 2020-07-10 LAB — TSH: TSH: 5.76 u[IU]/mL — ABNORMAL HIGH (ref 0.35–4.50)

## 2020-07-10 LAB — HEMOGLOBIN A1C: Hgb A1c MFr Bld: 5.7 % (ref 4.6–6.5)

## 2020-07-10 LAB — VITAMIN D 25 HYDROXY (VIT D DEFICIENCY, FRACTURES): VITD: 45.31 ng/mL (ref 30.00–100.00)

## 2020-07-10 MED ORDER — ALBUTEROL SULFATE HFA 108 (90 BASE) MCG/ACT IN AERS
1.0000 | INHALATION_SPRAY | RESPIRATORY_TRACT | 10 refills | Status: DC | PRN
Start: 2020-07-10 — End: 2022-04-05

## 2020-07-10 MED ORDER — ALPRAZOLAM 0.25 MG PO TABS
0.2500 mg | ORAL_TABLET | Freq: Two times a day (BID) | ORAL | 0 refills | Status: DC | PRN
Start: 2020-07-10 — End: 2022-01-28

## 2020-07-10 NOTE — Assessment & Plan Note (Signed)
Weighing daily and taking 20 mg furosemide daily , last ECHO reviewed Dec 2018 normal EF .  Diastolic dysfunction noted home bps  < 130/80

## 2020-07-10 NOTE — Patient Instructions (Addendum)
Daily use of dill pickle juice or 1 teaspoon of mustard  Can prevent leg cramps  You are doing well.  try to use the nebulizer at night as well since pollen season is here

## 2020-07-10 NOTE — Progress Notes (Signed)
Subjective:  Patient ID: Mary Howe, female    DOB: [birth year]  Age: 87 y.o. MRN: [id number]  CC: The primary encounter diagnosis was Fatigue, unspecified type. Diagnoses of Diabetes mellitus without complication ([place]), Hypercholesterolemia, NICM (nonischemic cardiomyopathy) ([place]), Vitamin D deficiency, Anxiety, Hypertension associated with type 2 diabetes mellitus ([place]), Hyperlipidemia associated with type 2 diabetes mellitus ([place]), Nocturnal hypoxia, Essential hypertension, Myalgia due to statin, Age related osteoporosis, unspecified pathological fracture presence, and Acquired hypothyroidism were also pertinent to this visit.  HPI Mary Howe presents for follow up on diet controlled type 2 DM , COPD , GAD and other issues  This visit occurred during the SARS-CoV-2 public health emergency.  Safety protocols were in place, including screening questions prior to the visit, additional usage of staff PPE, and extensive cleaning of exam room while observing appropriate contact time as indicated for disinfecting solutions.   Type 2 DM:  Weight reviewed.  She has gained 6 lbs,  Due to being less active for the last 6 months.  Appetite excellent.  Checking sugars several times per week,  More often if not feeling well.  No hypoglycemic symptoms .  Eye exam up to date.    Hard of hearing,  Masks making hearing  Difficult despite wearing bilateral hearing aid   She has been relatively asymptomatic, has been at baseline for several months.  She is no longer smoking,  And using her nebulized udesonide once or twice daily  NO recent falls.      Lab Results  Component Value Date   HGBA1C 5.7 07/10/2020    Denies fatigue .  Weighs daily but has not noticed any weight gain  No constipation. History of Grave's Disease remotely .Marland Kitchen Did not have the ablation .  Last TSH was mildly elevated     COPD/emphysema; sleeping with 02 at night ,  Uses it prn during the day if tired.   No new cough. Some cough  during budesonide  nebulizer use , clear sputum mostly.  Uses it once or twice daily .  Very seldom uses albuterol MDI   Taking zyrtec as well.    Recurrent foot cramps,  [place].   CVI        Outpatient Medications Prior to Visit  Medication Sig Dispense Refill  . acetaminophen (TYLENOL) 500 MG tablet Take 500 mg by mouth every 6 (six) hours as needed.    Marland Kitchen alendronate (FOSAMAX) 70 MG tablet Take 1 tablet (70 mg total) by mouth every 7 (seven) days. Take with a full glass of water on an empty stomach. 4 tablet 11  . bisoprolol (ZEBETA) 5 MG tablet TAKE 1/2 TABLET(2.5 MG) BY MOUTH DAILY 45 tablet 1  . budesonide (PULMICORT) 0.5 MG/2ML nebulizer solution USE 1 VIAL VIA NEBULIZER TWICE DAILY 120 mL 11  . calcium carbonate (OS-CAL) 600 MG TABS tablet Take 600 mg by mouth daily with breakfast.     . cetirizine (ZYRTEC) 10 MG tablet Take 1 tablet (10 mg total) by mouth daily. 90 tablet 1  . escitalopram (LEXAPRO) 10 MG tablet TAKE 1 TABLET(10 MG) BY MOUTH DAILY 90 tablet 1  . MULTIPLE VITAMIN PO Take 1 tablet by mouth daily.     . OXYGEN Inhale 2.5 L daily into the lungs.    . pantoprazole (PROTONIX) 40 MG tablet TAKE 1 TABLET(40 MG) BY MOUTH DAILY 90 tablet 1  . valACYclovir (VALTREX) 1000 MG tablet Take 1 tablet (1,000 mg total) by mouth 2 (two) times daily. For one  week, then reduce use to once daily (Patient taking differently: Take 1,000 mg by mouth daily as needed. For one week, then reduce use to once daily) 37 tablet 5  . albuterol (PROVENTIL HFA;VENTOLIN HFA) 108 (90 Base) MCG/ACT inhaler Inhale 1-2 puffs into the lungs every 4 (four) hours as needed for wheezing or shortness of breath. 1 Inhaler 10  . ALPRAZolam (XANAX) 0.25 MG tablet Take 1 tablet (0.25 mg total) by mouth 2 (two) times daily as needed for anxiety. 20 tablet 0  . furosemide (LASIX) 20 MG tablet Take 1 tablet (20 mg total) by mouth as directed. Take 1 tablet daily (20 mg) with extra 1 pill (20 mg) in evening for swelling.  180 tablet 3  . diphenhydrAMINE (BENADRYL) 25 MG tablet Take 25 mg by mouth at bedtime as needed. (Patient not taking: Reported on 07/10/2020)    . famciclovir (FAMVIR) 500 MG tablet Take 1 tablet (500 mg total) by mouth 3 (three) times daily. For shingles (Patient not taking: Reported on 07/10/2020) 21 tablet 1  . FLUZONE HIGH-DOSE QUADRIVALENT 0.7 ML SUSY  (Patient not taking: Reported on 07/10/2020)    . predniSONE (DELTASONE) 10 MG tablet 6 tablets daily for 3 days, then reduce by 1 tablet daily until gone (Patient not taking: Reported on 07/10/2020) 33 tablet 0  . triamcinolone cream (KENALOG) 0.1 % Apply 1 application topically 2 (two) times daily. For contact dermatitis (Patient not taking: Reported on 07/10/2020) 80 g 1   No facility-administered medications prior to visit.    Review of Systems;  Patient denies headache, fevers, malaise, unintentional weight loss, skin rash, eye pain, sinus congestion and sinus pain, sore throat, dysphagia,  hemoptysis , cough, dyspnea, wheezing, chest pain, palpitations, orthopnea, edema, abdominal pain, nausea, melena, diarrhea, constipation, flank pain, dysuria, hematuria, urinary  Frequency, nocturia, numbness, tingling, seizures,  Focal weakness, Loss of consciousness,  Tremor, insomnia, depression, anxiety, and suicidal ideation.      Objective:  BP 136/68 (BP Location: Left Arm, Patient Position: Sitting, Cuff Size: Normal)   Pulse 75   Temp 98.1 F (36.7 C) (Oral)   Resp 16   Ht 5\' 3"  (1.6 m)   Wt 136 lb 9.6 oz (62 kg)   SpO2 95%   BMI 24.20 kg/m   BP Readings from Last 3 Encounters:  07/10/20 136/68  11/07/19 (!) 132/80  07/02/19 130/60    Wt Readings from Last 3 Encounters:  07/10/20 136 lb 9.6 oz (62 kg)  06/10/20 130 lb (59 kg)  11/07/19 130 lb 9.6 oz (59.2 kg)    General appearance: alert, cooperative and appears stated age Ears: normal TM's and external ear canals both ears Throat: lips, mucosa, and tongue normal; teeth and gums  normal Neck: no adenopathy, no carotid bruit, supple, symmetrical, trachea midline and thyroid not enlarged, symmetric, no tenderness/mass/nodules Back: symmetric, no curvature. ROM normal. No CVA tenderness. Lungs: clear to auscultation bilaterally Heart: regular rate and rhythm, S1, S2 normal, no murmur, click, rub or gallop Abdomen: soft, non-tender; bowel sounds normal; no masses,  no organomegaly Pulses: 2+ and symmetric Skin: Skin color, texture, turgor normal. No rashes or lesions Lymph nodes: Cervical, supraclavicular, and axillary nodes normal.  Lab Results  Component Value Date   HGBA1C 5.7 07/10/2020   HGBA1C 5.9 06/10/2019   HGBA1C 5.9 11/01/2018    Lab Results  Component Value Date   CREATININE 0.60 07/10/2020   CREATININE 0.57 06/10/2019   CREATININE 0.54 11/01/2018    Lab Results  Component Value Date   WBC 5.4 07/10/2020   HGB 14.2 07/10/2020   HCT 41.1 07/10/2020   PLT 218.0 07/10/2020   GLUCOSE 106 (H) 07/10/2020   CHOL 222 (H) 07/10/2020   TRIG 88.0 07/10/2020   HDL 100.10 07/10/2020   LDLDIRECT 89.0 07/01/2016   LDLCALC 105 (H) 07/10/2020   ALT 16 07/10/2020   AST 23 07/10/2020   NA 134 (L) 07/10/2020   K 4.1 07/10/2020   CL 96 07/10/2020   CREATININE 0.60 07/10/2020   BUN 12 07/10/2020   CO2 29 07/10/2020   TSH 5.76 (H) 07/10/2020   INR 1.09 01/16/2017   HGBA1C 5.7 07/10/2020   MICROALBUR <0.7 07/10/2020    MM 3D SCREEN BREAST BILATERAL  Result Date: 07/04/2019 CLINICAL DATA:  Screening. EXAM: DIGITAL SCREENING BILATERAL MAMMOGRAM WITH TOMO AND CAD COMPARISON:  Previous exam(s). ACR Breast Density Category c: The breast tissue is heterogeneously dense, which may obscure small masses. FINDINGS: There are no findings suspicious for malignancy. Images were processed with CAD. IMPRESSION: No mammographic evidence of malignancy. A result letter of this screening mammogram will be mailed directly to the patient. RECOMMENDATION: Screening mammogram in  one year. (Code:SM-B-01Y) BI-RADS CATEGORY  1: Negative. Electronically Signed   By: Margarette Canada M.D.   On: 07/04/2019 15:05    Assessment & Plan:   Problem List Items Addressed This Visit      Unprioritized   Acquired hypothyroidism    She has had a 6 lb weight gain  over the winter, a rise In  her cholesterol, and notes thinning of hair.  Will start lowest dose levothyroxine and recheck level in 6 weeks   Lab Results  Component Value Date   TSH 5.76 (H) 07/10/2020         Relevant Medications   levothyroxine (SYNTHROID) 25 MCG tablet   Other Relevant Orders   Thyroid Panel With TSH   Hypertension associated with type 2 diabetes mellitus ([place])    Well controlled on bisoprolol and furosemide.  Sodium and Renal function stable, no changes today.  Lab Results  Component Value Date   CREATININE 0.60 07/10/2020   Lab Results  Component Value Date   NA 134 (L) 07/10/2020   K 4.1 07/10/2020   CL 96 07/10/2020   CO2 29 07/10/2020   Lab Results  Component Value Date   MICROALBUR <0.7 07/10/2020   MICROALBUR <0.7 11/01/2018           Hyperlipidemia associated with type 2 diabetes mellitus ([place])    Her hyperlipidemia is Untreated secondary to statin myalgia with previous trial of simvastatin; however her HDL: ratio is 1.  Diabetes remains  well-controlled on diet alone .  hemoglobin A1c has been consistently at or  less than 7.0 .Patient is up-to-date on eye exams and foot exam is normal today. Patient has no microalbuminuria.  Lab Results  Component Value Date   HGBA1C 5.7 07/10/2020    Lab Results  Component Value Date   MICROALBUR <0.7 07/10/2020   MICROALBUR <0.7 11/01/2018     Lab Results  Component Value Date   CHOL 222 (H) 07/10/2020   HDL 100.10 07/10/2020   LDLCALC 105 (H) 07/10/2020   LDLDIRECT 89.0 07/01/2016   TRIG 88.0 07/10/2020   CHOLHDL 2 07/10/2020         RESOLVED: Diabetes mellitus without complication (HCC)   Relevant Orders    Hemoglobin A1c (Completed)   Lipid panel (Completed)   Comprehensive metabolic panel (Completed)  Microalbumin / creatinine urine ratio (Completed)   RESOLVED: Hypercholesterolemia   Nocturnal hypoxia    Secondary to COPD.  Continue supplemental oxygen during sleep to prevent deterioration of health       RESOLVED: Essential hypertension    Well controlled on current regimen. Renal function stable, no changes today.      NICM (nonischemic cardiomyopathy) ([place])    Weighing daily and taking 20 mg furosemide daily , last ECHO reviewed Dec 2018 normal EF .  Diastolic dysfunction noted home bps  < 130/80      Myalgia due to statin    She did not tolerate statin trial with simvastatin due to profound myalgias.        Age related osteoporosis    Denies back pain currently.  Reviewed calcium and vitamin D needs,  And encouraged more regular use of alendronate        Other Visit Diagnoses    Fatigue, unspecified type    -  Primary   Relevant Orders   TSH (Completed)   CBC with Differential/Platelet (Completed)   Vitamin D deficiency       Relevant Orders   VITAMIN D 25 Hydroxy (Vit-D Deficiency, Fractures) (Completed)   Anxiety       Relevant Medications   ALPRAZolam (XANAX) 0.25 MG tablet      I have discontinued Mary Howe's diphenhydrAMINE, triamcinolone, famciclovir, predniSONE, and Fluzone High-Dose Quadrivalent. I have also changed her albuterol. Additionally, I am having her start on levothyroxine. Lastly, I am having her maintain her MULTIPLE VITAMIN PO, calcium carbonate, acetaminophen, OXYGEN, cetirizine, valACYclovir, bisoprolol, furosemide, budesonide, alendronate, pantoprazole, escitalopram, and ALPRAZolam.  Meds ordered this encounter  Medications  . albuterol (VENTOLIN HFA) 108 (90 Base) MCG/ACT inhaler    Sig: Inhale 1-2 puffs into the lungs every 4 (four) hours as needed for wheezing or shortness of breath.    Dispense:  1 each    Refill:  10  .  ALPRAZolam (XANAX) 0.25 MG tablet    Sig: Take 1 tablet (0.25 mg total) by mouth 2 (two) times daily as needed for anxiety.    Dispense:  20 tablet    Refill:  0  . levothyroxine (SYNTHROID) 25 MCG tablet    Sig: Take 1 tablet (25 mcg total) by mouth daily at 6 (six) AM.    Dispense:  90 tablet    Refill:  3    Medications Discontinued During This Encounter  Medication Reason  . diphenhydrAMINE (BENADRYL) 25 MG tablet   . famciclovir (FAMVIR) 500 MG tablet   . FLUZONE HIGH-DOSE QUADRIVALENT 0.7 ML SUSY   . predniSONE (DELTASONE) 10 MG tablet   . triamcinolone cream (KENALOG) 0.1 %   . albuterol (PROVENTIL HFA;VENTOLIN HFA) 108 (90 Base) MCG/ACT inhaler Reorder  . ALPRAZolam (XANAX) 0.25 MG tablet Reorder    Follow-up: Return in about 6 months (around 01/09/2021).   [name], MD

## 2020-07-12 DIAGNOSIS — E1169 Type 2 diabetes mellitus with other specified complication: Secondary | ICD-10-CM | POA: Insufficient documentation

## 2020-07-12 DIAGNOSIS — E039 Hypothyroidism, unspecified: Secondary | ICD-10-CM | POA: Insufficient documentation

## 2020-07-12 DIAGNOSIS — E785 Hyperlipidemia, unspecified: Secondary | ICD-10-CM | POA: Insufficient documentation

## 2020-07-12 MED ORDER — LEVOTHYROXINE SODIUM 25 MCG PO TABS: 25.0000 ug | ORAL_TABLET | Freq: Every day | ORAL | 3 refills | Status: DC

## 2020-07-12 NOTE — Assessment & Plan Note (Signed)
She did not tolerate statin trial with simvastatin due to profound myalgias.

## 2020-07-12 NOTE — Assessment & Plan Note (Signed)
Well controlled on current regimen. Renal function stable, no changes today. 

## 2020-07-12 NOTE — Assessment & Plan Note (Signed)
She has had a 6 lb weight gain  over the winter, a rise In  her cholesterol, and notes thinning of hair.  Will start lowest dose levothyroxine and recheck level in 6 weeks   Lab Results  Component Value Date   TSH 5.76 (H) 07/10/2020

## 2020-07-12 NOTE — Assessment & Plan Note (Signed)
Her hyperlipidemia is Untreated secondary to statin myalgia with previous trial of simvastatin; however her HDL: ratio is 1.  Diabetes remains  well-controlled on diet alone .  hemoglobin A1c has been consistently at or  less than 7.0 .Patient is up-to-date on eye exams and foot exam is normal today. Patient has no microalbuminuria.  Lab Results  Component Value Date   HGBA1C 5.7 07/10/2020    Lab Results  Component Value Date   MICROALBUR <0.7 07/10/2020   MICROALBUR <0.7 11/01/2018     Lab Results  Component Value Date   CHOL 222 (H) 07/10/2020   HDL 100.10 07/10/2020   LDLCALC 105 (H) 07/10/2020   LDLDIRECT 89.0 07/01/2016   TRIG 88.0 07/10/2020   CHOLHDL 2 07/10/2020

## 2020-07-12 NOTE — Assessment & Plan Note (Addendum)
Well controlled on bisoprolol and furosemide.  Sodium and Renal function stable, no changes today.  Lab Results  Component Value Date   CREATININE 0.60 07/10/2020   Lab Results  Component Value Date   NA 134 (L) 07/10/2020   K 4.1 07/10/2020   CL 96 07/10/2020   CO2 29 07/10/2020   Lab Results  Component Value Date   MICROALBUR <0.7 07/10/2020   MICROALBUR <0.7 11/01/2018

## 2020-07-12 NOTE — Assessment & Plan Note (Signed)
Secondary to COPD.  Continue supplemental oxygen during sleep to prevent deterioration of health

## 2020-07-12 NOTE — Assessment & Plan Note (Signed)
Denies back pain currently.  Reviewed calcium and vitamin D needs,  And encouraged more regular use of alendronate

## 2020-07-15 NOTE — Telephone Encounter (Signed)
Pt's daughter returned your call

## 2020-07-16 NOTE — Telephone Encounter (Signed)
Spoke with pt's daughter and scheduled pt for a follow up with Dr. Derrel Nip.

## 2020-07-21 NOTE — Progress Notes (Signed)
Cardiology Office Note  Date:  07/22/2020   ID:  Mary Howe, DOB [birth year], MRN [id number]  PCP:  [name], MD   Chief Complaint  Patient presents with  . 12 month follow up     "doing well." Medications reviewed by the patient verbally.     HPI:  87 y.o. female with a history of  COPD,  hypertension,  hyperlipidemia,  GERD,  mild pulmonary hypertension,  diagnosis of Takotsubo cardiomyopathy  Ef 60% in 01/2017, up from 25% who presents for follow-up of her cardiomyopathy  LOV 06/3019 Last echocardiogram December 2018, ejection fraction 60%  On today's visit she presents with her daughter Sedentary, though does stay active around the house, does yard work, has stairs Lives with sister, stress associated with this  Labs: Reviewed CBC ,  Total chol 222 A1C 5.7  CT chest 2018: mild diffuse aortic atherosclerosis Images pulled up and reviewed in the office today  No edema,  Two Glasses of wine a day (used to be 1)  No chest pain, shortness of breath on exertion Lasix 20 daily, denies leg swelling Rarely extra Lasix after lunch  EKG personally reviewed by myself on todays visit Shows normal sinus rhythm. 72 bpm.  No significant ST-T wave changes   OTHER PAST MEDICAL HISTORY REVIEWED BY ME FOR TODAY'S VISIT: Lots of stress, takes care of sister who is 58 Doing pulmonary rehab, exercising well, feels good  Follow-up echocardiogram 03/16/2017 with ejection fraction 55-60%. Using oxygen at nighttime alone  Echocardiogram on 01/12/2017 showed LV dysfunction with an EF of 25-30% with severe global hypokinesis. Troponin 0.54.   Diagnostic catheterization on 01/16/2017 revealed normal coronary arteries, normal right heart filling pressures, and mild pulmonary hypertension.  EF on ventriculography has improved some to 35-45%.   October 2018 admitted with acute on chronic hypoxic and hypercapnic respiratory failure in the setting of atypical pulmonary infection and  AE COPD. Acute encephalopathy and sepsis with hypotension requiring vasopressors. She was discharged on low-dose bisoprolol and losartan. The losartan was discontinued in the setting of soft blood pressures.  PMH:   has a past medical history of CHF (congestive heart failure) ([place]), COPD (chronic obstructive pulmonary disease) (), Dental bridge present, Diverticulitis (2015), Essential hypertension, GERD (gastroesophageal reflux disease), Hyperlipidemia, PAH (pulmonary artery hypertension) ([place]), Takotsubo cardiomyopathy, Thyroid disease, and Wears hearing aid in both ears.  PSH:    Past Surgical History:  Procedure Laterality Date  . CATARACT EXTRACTION W/PHACO Right 10/10/2017   Procedure: CATARACT EXTRACTION PHACO AND INTRAOCULAR LENS PLACEMENT ([place])  RIGHT;  Surgeon: Leandrew Koyanagi, MD;  Location: [facility];  Service: Ophthalmology;  Laterality: Right;  . CATARACT EXTRACTION W/PHACO Left 11/15/2017   Procedure: CATARACT EXTRACTION PHACO AND INTRAOCULAR LENS PLACEMENT ([place])  LEFT;  Surgeon: Leandrew Koyanagi, MD;  Location: [facility];  Service: Ophthalmology;  Laterality: Left;  . CERVICAL CONE BIOPSY    . CHOLECYSTECTOMY    . HEMORROIDECTOMY    . RIGHT/LEFT HEART CATH AND CORONARY ANGIOGRAPHY N/A 01/16/2017   Procedure: RIGHT/LEFT HEART CATH AND CORONARY ANGIOGRAPHY;  Surgeon: Wellington Hampshire, MD;  Location: [facility];  Service: Cardiovascular;  Laterality: N/A;    Current Outpatient Medications  Medication Sig Dispense Refill  . acetaminophen (TYLENOL) 500 MG tablet Take 500 mg by mouth every 6 (six) hours as needed.    Marland Kitchen albuterol (VENTOLIN HFA) 108 (90 Base) MCG/ACT inhaler Inhale 1-2 puffs into the lungs every 4 (four) hours as needed for wheezing or  shortness of breath. 1 each 10  . alendronate (FOSAMAX) 70 MG tablet Take 1 tablet (70 mg total) by mouth every 7 (seven) days. Take with a full glass of water on an empty stomach. 4 tablet 11  .  ALPRAZolam (XANAX) 0.25 MG tablet Take 1 tablet (0.25 mg total) by mouth 2 (two) times daily as needed for anxiety. 20 tablet 0  . bisoprolol (ZEBETA) 5 MG tablet TAKE 1/2 TABLET(2.5 MG) BY MOUTH DAILY 45 tablet 1  . budesonide (PULMICORT) 0.5 MG/2ML nebulizer solution USE 1 VIAL VIA NEBULIZER TWICE DAILY 120 mL 11  . calcium carbonate (OS-CAL) 600 MG TABS tablet Take 600 mg by mouth daily with breakfast.     . cetirizine (ZYRTEC) 10 MG tablet Take 1 tablet (10 mg total) by mouth daily. 90 tablet 1  . escitalopram (LEXAPRO) 10 MG tablet TAKE 1 TABLET(10 MG) BY MOUTH DAILY 90 tablet 1  . furosemide (LASIX) 20 MG tablet Take 1 tablet (20 mg total) by mouth as directed. Take 1 tablet daily (20 mg) with extra 1 pill (20 mg) in evening for swelling. 180 tablet 3  . levothyroxine (SYNTHROID) 25 MCG tablet Take 1 tablet (25 mcg total) by mouth daily at 6 (six) AM. 90 tablet 3  . MULTIPLE VITAMIN PO Take 1 tablet by mouth daily.     . OXYGEN Inhale 2.5 L daily into the lungs.    . pantoprazole (PROTONIX) 40 MG tablet TAKE 1 TABLET(40 MG) BY MOUTH DAILY 90 tablet 1  . valACYclovir (VALTREX) 1000 MG tablet Take 1 tablet (1,000 mg total) by mouth 2 (two) times daily. For one week, then reduce use to once daily (Patient taking differently: Take 1,000 mg by mouth daily as needed. For one week, then reduce use to once daily) 37 tablet 5   No current facility-administered medications for this visit.     Allergies:   Simvastatin   Social History:  The patient  reports that she quit smoking about 32 years ago. Her smoking use included cigarettes. She has a 43.50 pack-year smoking history. She has never used smokeless tobacco. She reports current alcohol use of about 7.0 standard drinks of alcohol per week. She reports that she does not use drugs.   Family History:   family history includes Aneurysm in her sister; Bone cancer in her sister; Brain cancer in her brother; CAD in her brother and brother; Emphysema in  her sister; Fibrocystic breast disease in her sister; Heart attack in her brother; Heart disease in her brother and sister; Lung disease in her brother; Pneumonia in her brother; Prostate cancer in her father.    Review of Systems  Constitutional: Negative.   HENT: Negative.   Respiratory: Negative.   Cardiovascular: Negative.   Gastrointestinal: Negative.   Musculoskeletal: Negative.   Neurological: Negative.   Psychiatric/Behavioral: Negative.   All other systems reviewed and are negative.   PHYSICAL EXAM: VS:  BP 122/60 (BP Location: Left Arm, Patient Position: Sitting, Cuff Size: Normal)   Pulse 72   Ht 5\' 3"  (1.6 m)   Wt 138 lb (62.6 kg)   SpO2 96%   BMI 24.45 kg/m  , BMI Body mass index is 24.45 kg/m. Constitutional:  oriented to person, place, and time. No distress.  HENT:  Head: Grossly normal Eyes:  no discharge. No scleral icterus.  Neck: No JVD, no carotid bruits  Cardiovascular: Regular rate and rhythm, no murmurs appreciated Pulmonary/Chest: Clear to auscultation bilaterally, no wheezes or rails Abdominal: Soft.  no distension.  no tenderness.  Musculoskeletal: Normal range of motion Neurological:  normal muscle tone. Coordination normal. No atrophy Skin: Skin warm and dry Psychiatric: normal affect, pleasant   Recent Labs: 07/10/2020: ALT 16; BUN 12; Creatinine, Ser 0.60; Hemoglobin 14.2; Platelets 218.0; Potassium 4.1; Sodium 134; TSH 5.76    Lipid Panel Lab Results  Component Value Date   CHOL 222 (H) 07/10/2020   HDL 100.10 07/10/2020   LDLCALC 105 (H) 07/10/2020   TRIG 88.0 07/10/2020    Wt Readings from Last 3 Encounters:  07/22/20 138 lb (62.6 kg)  07/10/20 136 lb 9.6 oz (62 kg)  06/10/20 130 lb (59 kg)     ASSESSMENT AND PLAN:  NICM (nonischemic cardiomyopathy) ([place])  Previously felt secondary to stress cardiomyopathy, pneumonia, COPD  2018 EF normalized  Lasix daily, renal function stable Appears euvolemic, stable on today's  visit  Essential hypertension Blood pressure is well controlled on today's visit. No changes made to the medications.  Centrilobular emphysema (HCC) Uses Pulmicort nebulizer, does not like inhaler Stable, has nebs  Aortic atherosclerosis: Mild diffuse, images reviewed with her Could restart statin if patient decides to pursue an aggressive course She will talk with Dr. Derrel Nip Denies side effects on prior statin use  Lower extremity edema Lasix daily, occasionally extra Lasix in the afternoon stable  Hypercholesterolemia cardiac catheterization with no significant disease Currently not on a statin Could start statin for aorta plaque, mild diffuse  Diabetes type 2 A1C well controlled    Total encounter time more than 25 minutes  Greater than 50% was spent in counseling and coordination of care with the patient    No orders of the defined types were placed in this encounter.    Signed, Esmond Plants, M.D., Ph.D. 07/22/2020  [place], [place]

## 2020-07-22 ENCOUNTER — Other Ambulatory Visit: Payer: Self-pay

## 2020-07-22 ENCOUNTER — Ambulatory Visit (INDEPENDENT_AMBULATORY_CARE_PROVIDER_SITE_OTHER): Payer: Medicare HMO | Admitting: Cardiovascular Disease

## 2020-07-22 ENCOUNTER — Encounter: Payer: Self-pay | Admitting: Cardiovascular Disease

## 2020-07-22 VITALS — BP 122/60 | HR 72 | Ht 63.0 in | Wt 138.0 lb

## 2020-07-22 DIAGNOSIS — I10 Essential (primary) hypertension: Secondary | ICD-10-CM

## 2020-07-22 DIAGNOSIS — I428 Other cardiomyopathies: Secondary | ICD-10-CM

## 2020-07-22 DIAGNOSIS — J430 Unilateral pulmonary emphysema [MacLeod's syndrome]: Secondary | ICD-10-CM

## 2020-07-22 DIAGNOSIS — I152 Hypertension secondary to endocrine disorders: Secondary | ICD-10-CM

## 2020-07-22 DIAGNOSIS — E7800 Pure hypercholesterolemia, unspecified: Secondary | ICD-10-CM

## 2020-07-22 DIAGNOSIS — E785 Hyperlipidemia, unspecified: Secondary | ICD-10-CM | POA: Diagnosis not present

## 2020-07-22 DIAGNOSIS — E1169 Type 2 diabetes mellitus with other specified complication: Secondary | ICD-10-CM | POA: Diagnosis not present

## 2020-07-22 DIAGNOSIS — E1159 Type 2 diabetes mellitus with other circulatory complications: Secondary | ICD-10-CM

## 2020-07-22 NOTE — Patient Instructions (Signed)
Medication Instructions:  No changes  If you need a refill on your cardiac medications before your next appointment, please call your pharmacy.    Lab work: No new labs needed   If you have labs (blood work) drawn today and your tests are completely normal, you will receive your results only by: . MyChart Message (if you have MyChart) OR . A paper copy in the mail If you have any lab test that is abnormal or we need to change your treatment, we will call you to review the results.   Testing/Procedures: No new testing needed   Follow-Up: At [facility], you and your health needs are our priority.  As part of our continuing mission to provide you with exceptional heart care, we have created designated Provider Care Teams.  These Care Teams include your primary Cardiologist (physician) and Advanced Practice Providers (APPs -  Physician Assistants and Nurse Practitioners) who all work together to provide you with the care you need, when you need it.  . You will need a follow up appointment in 12 months  . Providers on your designated Care Team:   . Christopher Berge, NP . Ryan Dunn, PA-C . Jacquelyn Visser, PA-C  Any Other Special Instructions Will Be Listed Below (If Applicable).  COVID-19 Vaccine Information can be found at: [email or web address] For questions related to vaccine distribution or appointments, please email [email or web address] or call [phone].     

## 2020-07-30 ENCOUNTER — Encounter: Payer: Self-pay | Admitting: Internal Medicine

## 2020-07-30 ENCOUNTER — Ambulatory Visit (INDEPENDENT_AMBULATORY_CARE_PROVIDER_SITE_OTHER): Payer: Medicare HMO | Admitting: Internal Medicine

## 2020-07-30 ENCOUNTER — Other Ambulatory Visit: Payer: Self-pay

## 2020-07-30 DIAGNOSIS — E1159 Type 2 diabetes mellitus with other circulatory complications: Secondary | ICD-10-CM | POA: Diagnosis not present

## 2020-07-30 DIAGNOSIS — E039 Hypothyroidism, unspecified: Secondary | ICD-10-CM | POA: Diagnosis not present

## 2020-07-30 DIAGNOSIS — I152 Hypertension secondary to endocrine disorders: Secondary | ICD-10-CM

## 2020-07-30 DIAGNOSIS — E1169 Type 2 diabetes mellitus with other specified complication: Secondary | ICD-10-CM

## 2020-07-30 DIAGNOSIS — J9611 Chronic respiratory failure with hypoxia: Secondary | ICD-10-CM

## 2020-07-30 DIAGNOSIS — E785 Hyperlipidemia, unspecified: Secondary | ICD-10-CM

## 2020-07-30 DIAGNOSIS — F411 Generalized anxiety disorder: Secondary | ICD-10-CM | POA: Diagnosis not present

## 2020-07-30 NOTE — Progress Notes (Signed)
Subjective:  Patient ID: Mary Howe, female    DOB: 02/11/1933  Age: 87 y.o. MRN: [id number]  CC: Diagnoses of Chronic respiratory failure with hypoxia ([place]), Acquired hypothyroidism, Anxiety, generalized, Hypertension associated with type 2 diabetes mellitus ([place]), and Hyperlipidemia associated with type 2 diabetes mellitus ([place]) were pertinent to this visit.   HPI Mary Howe presents for follow up on chronic issues, this time she is accomapnied by her daughter Mary Howe   This visit occurred during the SARS-CoV-2 public health emergency.  Safety protocols were in place, including screening questions prior to the visit, additional usage of staff PPE, and extensive cleaning of exam room while observing appropriate contact time as indicated for disinfecting solutions.   HTN:  Patient is taking her medications as prescribed and notes no adverse effects.  Home BP readings have been done about once per week and are  generally < 140/80 .  She is avoiding added salt in her diet and walking regularly about 3 times per week for exercise  .Marland Kitchen Home systolic readings have range 073 and 140/diastolic < 90 taking 2.5 mg zebeta daily (cut daily)   Type 2 DM:  Reviewed the diagnosis of diet controlled diabetes diagnosed several years ago with fasting glucose of 125 , a1c was 6.4 .  Since then a1c has been 6.1 or lower:  Does not check sugars.   Follows a Mediterranean style diet most of the time. Weight has been relatively stable.    COPD: she has chronic hypoxic respiratory failure managed with supplemental  oxygen only at night..  With better weather she has been walking daily  For exercise and has noticed that occasionally her  02 sats have dropped to 88 occasionally  Outpatient Medications Prior to Visit  Medication Sig Dispense Refill  . acetaminophen (TYLENOL) 500 MG tablet Take 500 mg by mouth every 6 (six) hours as needed.    Marland Kitchen albuterol (VENTOLIN HFA) 108 (90 Base) MCG/ACT inhaler Inhale 1-2 puffs  into the lungs every 4 (four) hours as needed for wheezing or shortness of breath. 1 each 10  . alendronate (FOSAMAX) 70 MG tablet Take 1 tablet (70 mg total) by mouth every 7 (seven) days. Take with a full glass of water on an empty stomach. 4 tablet 11  . ALPRAZolam (XANAX) 0.25 MG tablet Take 1 tablet (0.25 mg total) by mouth 2 (two) times daily as needed for anxiety. 20 tablet 0  . bisoprolol (ZEBETA) 5 MG tablet TAKE 1/2 TABLET(2.5 MG) BY MOUTH DAILY 45 tablet 1  . budesonide (PULMICORT) 0.5 MG/2ML nebulizer solution USE 1 VIAL VIA NEBULIZER TWICE DAILY 120 mL 11  . calcium carbonate (OS-CAL) 600 MG TABS tablet Take 600 mg by mouth daily with breakfast.     . cetirizine (ZYRTEC) 10 MG tablet Take 1 tablet (10 mg total) by mouth daily. 90 tablet 1  . escitalopram (LEXAPRO) 10 MG tablet TAKE 1 TABLET(10 MG) BY MOUTH DAILY 90 tablet 1  . levothyroxine (SYNTHROID) 25 MCG tablet Take 1 tablet (25 mcg total) by mouth daily at 6 (six) AM. 90 tablet 3  . MULTIPLE VITAMIN PO Take 1 tablet by mouth daily.     . OXYGEN Inhale 2.5 L daily into the lungs.    . pantoprazole (PROTONIX) 40 MG tablet TAKE 1 TABLET(40 MG) BY MOUTH DAILY 90 tablet 1  . valACYclovir (VALTREX) 1000 MG tablet Take 1 tablet (1,000 mg total) by mouth 2 (two) times daily. For one week, then reduce use to once  daily (Patient taking differently: Take 1,000 mg by mouth daily as needed. For one week, then reduce use to once daily) 37 tablet 5  . furosemide (LASIX) 20 MG tablet Take 1 tablet (20 mg total) by mouth as directed. Take 1 tablet daily (20 mg) with extra 1 pill (20 mg) in evening for swelling. 180 tablet 3   No facility-administered medications prior to visit.    Review of Systems;  Patient denies headache, fevers, malaise, unintentional weight loss, skin rash, eye pain, sinus congestion and sinus pain, sore throat, dysphagia,  hemoptysis , cough, dyspnea, wheezing, chest pain, palpitations, orthopnea, edema, abdominal pain,  nausea, melena, diarrhea, constipation, flank pain, dysuria, hematuria, urinary  Frequency, nocturia, numbness, tingling, seizures,  Focal weakness, Loss of consciousness,  Tremor, insomnia, depression, anxiety, and suicidal ideation.      Objective:  BP (!) 148/84 (BP Location: Left Arm, Patient Position: Sitting, Cuff Size: Normal)   Pulse 69   Temp (!) 96.3 F (35.7 C) (Oral)   Resp 15   Ht 5\' 3"  (1.6 m)   Wt 137 lb 9.6 oz (62.4 kg)   SpO2 95%   BMI 24.37 kg/m   BP Readings from Last 3 Encounters:  07/30/20 (!) 148/84  07/22/20 122/60  07/10/20 136/68    Wt Readings from Last 3 Encounters:  07/30/20 137 lb 9.6 oz (62.4 kg)  07/22/20 138 lb (62.6 kg)  07/10/20 136 lb 9.6 oz (62 kg)    General appearance: alert, cooperative and appears stated age Ears: normal TM's and external ear canals both ears Throat: lips, mucosa, and tongue normal; teeth and gums normal Neck: no adenopathy, no carotid bruit, supple, symmetrical, trachea midline and thyroid not enlarged, symmetric, no tenderness/mass/nodules Back: symmetric, no curvature. ROM normal. No CVA tenderness. Lungs: clear to auscultation bilaterally Heart: regular rate and rhythm, S1, S2 normal, no murmur, click, rub or gallop Abdomen: soft, non-tender; bowel sounds normal; no masses,  no organomegaly Pulses: 2+ and symmetric Skin: Skin color, texture, turgor normal. No rashes or lesions Lymph nodes: Cervical, supraclavicular, and axillary nodes normal.  Lab Results  Component Value Date   HGBA1C 5.7 07/10/2020   HGBA1C 5.9 06/10/2019   HGBA1C 5.9 11/01/2018    Lab Results  Component Value Date   CREATININE 0.60 07/10/2020   CREATININE 0.57 06/10/2019   CREATININE 0.54 11/01/2018    Lab Results  Component Value Date   WBC 5.4 07/10/2020   HGB 14.2 07/10/2020   HCT 41.1 07/10/2020   PLT 218.0 07/10/2020   GLUCOSE 106 (H) 07/10/2020   CHOL 222 (H) 07/10/2020   TRIG 88.0 07/10/2020   HDL 100.10 07/10/2020    LDLDIRECT 89.0 07/01/2016   LDLCALC 105 (H) 07/10/2020   ALT 16 07/10/2020   AST 23 07/10/2020   NA 134 (L) 07/10/2020   K 4.1 07/10/2020   CL 96 07/10/2020   CREATININE 0.60 07/10/2020   BUN 12 07/10/2020   CO2 29 07/10/2020   TSH 5.76 (H) 07/10/2020   INR 1.09 01/16/2017   HGBA1C 5.7 07/10/2020   MICROALBUR <0.7 07/10/2020    MM 3D SCREEN BREAST BILATERAL  Result Date: 07/04/2019 CLINICAL DATA:  Screening. EXAM: DIGITAL SCREENING BILATERAL MAMMOGRAM WITH TOMO AND CAD COMPARISON:  Previous exam(s). ACR Breast Density Category c: The breast tissue is heterogeneously dense, which may obscure small masses. FINDINGS: There are no findings suspicious for malignancy. Images were processed with CAD. IMPRESSION: No mammographic evidence of malignancy. A result letter of this screening mammogram will  be mailed directly to the patient. RECOMMENDATION: Screening mammogram in one year. (Code:SM-B-01Y) BI-RADS CATEGORY  1: Negative. Electronically Signed   By: Margarette Canada M.D.   On: 07/04/2019 15:05    Assessment & Plan:   Problem List Items Addressed This Visit      Unprioritized   Acquired hypothyroidism    New diagnosis,  Possibly subclinical hypothroidism since symptoms are mild.  Repeat TSH after 6 weeks of 25 mcg levothyroxine      Anxiety, generalized    Aggravated by health issues and overbearing sister. Improved with Lexapro at 5 mg daily and prn xanax. . The risks and benefits of benzodiazepine use were reviewedd with patient today including excessive sedation leading to respiratory depression,  impaired thinking/driving, and addiction.  Patient was advised to avoid concurrent use with alcohol, to use medication only as needed and not to share with others  .       Chronic respiratory failure with hypoxia (H[facility])    Secondary to advanced COPD.  She is hypoxic on room air with a 3 minute walk today.  Advised to start using supplemental oxygen  With activity .  She has a Portable oxygen  tank but is not currently using it.       Hyperlipidemia associated with type 2 diabetes mellitus ([place])    Her hyperlipidemia is Untreated secondary to statin myalgia with previous trial of simvastatin; however her HDL:LDL  ratio is 1.  Diabetes remains  well-controlled on diet alone .  hemoglobin A1c has been consistently at or  less than 7.0 .Patient is up-to-date on eye exams and foot exam is normal today. Patient has no microalbuminuria.  Lab Results  Component Value Date   HGBA1C 5.7 07/10/2020    Lab Results  Component Value Date   MICROALBUR <0.7 07/10/2020   MICROALBUR <0.7 11/01/2018     Lab Results  Component Value Date   CHOL 222 (H) 07/10/2020   HDL 100.10 07/10/2020   LDLCALC 105 (H) 07/10/2020   LDLDIRECT 89.0 07/01/2016   TRIG 88.0 07/10/2020   CHOLHDL 2 07/10/2020         Hypertension associated with type 2 diabetes mellitus ([place])    Well controlled on 2.5 mg  bisoprolol and furosemide.  Sodium and Renal function stable, no changes today.  Lab Results  Component Value Date   CREATININE 0.60 07/10/2020   Lab Results  Component Value Date   NA 134 (L) 07/10/2020   K 4.1 07/10/2020   CL 96 07/10/2020   CO2 29 07/10/2020   Lab Results  Component Value Date   MICROALBUR <0.7 07/10/2020   MICROALBUR <0.7 11/01/2018              I provided  30 minutes of  face-to-face time during this encounter reviewing patient's current problems and past surgeries, labs and imaging studies, providing counseling on the above mentioned problems , and coordination  of care .   I am having Mary Howe maintain her MULTIPLE VITAMIN PO, calcium carbonate, acetaminophen, OXYGEN, cetirizine, valACYclovir, bisoprolol, furosemide, budesonide, alendronate, pantoprazole, escitalopram, albuterol, ALPRAZolam, and levothyroxine.  No orders of the defined types were placed in this encounter.   There are no discontinued medications.  Follow-up: Return in about 6 months  (around 01/29/2021).   [name], MD

## 2020-07-30 NOTE — Patient Instructions (Signed)
You are doing well!  You do not need cholesterol medication  You have "diet controlled"  Diabetes  We will recheck your thyroid level after 6 weeks of medication

## 2020-07-31 ENCOUNTER — Encounter: Payer: Self-pay | Admitting: Internal Medicine

## 2020-07-31 DIAGNOSIS — J9611 Chronic respiratory failure with hypoxia: Secondary | ICD-10-CM | POA: Insufficient documentation

## 2020-07-31 NOTE — Assessment & Plan Note (Signed)
New diagnosis,  Possibly subclinical hypothroidism since symptoms are mild.  Repeat TSH after 6 weeks of 25 mcg levothyroxine

## 2020-07-31 NOTE — Assessment & Plan Note (Signed)
Her hyperlipidemia is Untreated secondary to statin myalgia with previous trial of simvastatin; however her HDL:LDL  ratio is 1.  Diabetes remains  well-controlled on diet alone .  hemoglobin A1c has been consistently at or  less than 7.0 .Patient is up-to-date on eye exams and foot exam is normal today. Patient has no microalbuminuria.  Lab Results  Component Value Date   HGBA1C 5.7 07/10/2020    Lab Results  Component Value Date   MICROALBUR <0.7 07/10/2020   MICROALBUR <0.7 11/01/2018     Lab Results  Component Value Date   CHOL 222 (H) 07/10/2020   HDL 100.10 07/10/2020   LDLCALC 105 (H) 07/10/2020   LDLDIRECT 89.0 07/01/2016   TRIG 88.0 07/10/2020   CHOLHDL 2 07/10/2020

## 2020-07-31 NOTE — Assessment & Plan Note (Signed)
Secondary to advanced COPD.  She is hypoxic on room air with a 3 minute walk today.  Advised to start using supplemental oxygen  With activity .  She has a Portable oxygen tank but is not currently using it.

## 2020-07-31 NOTE — Assessment & Plan Note (Signed)
Aggravated by health issues and overbearing sister. Improved with Lexapro at 5 mg daily and prn xanax. . The risks and benefits of benzodiazepine use were reviewedd with patient today including excessive sedation leading to respiratory depression,  impaired thinking/driving, and addiction.  Patient was advised to avoid concurrent use with alcohol, to use medication only as needed and not to share with others  .  

## 2020-07-31 NOTE — Assessment & Plan Note (Addendum)
Well controlled on 2.5 mg  bisoprolol and furosemide.  Sodium and Renal function stable, no changes today.  Lab Results  Component Value Date   CREATININE 0.60 07/10/2020   Lab Results  Component Value Date   NA 134 (L) 07/10/2020   K 4.1 07/10/2020   CL 96 07/10/2020   CO2 29 07/10/2020   Lab Results  Component Value Date   MICROALBUR <0.7 07/10/2020   MICROALBUR <0.7 11/01/2018

## 2020-09-01 DIAGNOSIS — H903 Sensorineural hearing loss, bilateral: Secondary | ICD-10-CM | POA: Diagnosis not present

## 2020-09-08 ENCOUNTER — Other Ambulatory Visit (INDEPENDENT_AMBULATORY_CARE_PROVIDER_SITE_OTHER): Payer: Medicare HMO

## 2020-09-08 ENCOUNTER — Other Ambulatory Visit: Payer: Self-pay

## 2020-09-08 DIAGNOSIS — E039 Hypothyroidism, unspecified: Secondary | ICD-10-CM

## 2020-09-09 LAB — THYROID PANEL WITH TSH
Free Thyroxine Index: 2 (ref 1.4–3.8)
T3 Uptake: 33 % (ref 22–35)
T4, Total: 6 ug/dL (ref 5.1–11.9)
TSH: 3.81 mIU/L (ref 0.40–4.50)

## 2020-09-15 ENCOUNTER — Other Ambulatory Visit: Payer: Self-pay | Admitting: Internal Medicine

## 2020-09-18 ENCOUNTER — Other Ambulatory Visit: Payer: Self-pay | Admitting: Internal Medicine

## 2020-09-24 DIAGNOSIS — L570 Actinic keratosis: Secondary | ICD-10-CM | POA: Diagnosis not present

## 2020-09-24 DIAGNOSIS — I872 Venous insufficiency (chronic) (peripheral): Secondary | ICD-10-CM | POA: Diagnosis not present

## 2020-09-24 DIAGNOSIS — D2261 Melanocytic nevi of right upper limb, including shoulder: Secondary | ICD-10-CM | POA: Diagnosis not present

## 2020-09-24 DIAGNOSIS — L940 Localized scleroderma [morphea]: Secondary | ICD-10-CM | POA: Diagnosis not present

## 2020-09-24 DIAGNOSIS — D225 Melanocytic nevi of trunk: Secondary | ICD-10-CM | POA: Diagnosis not present

## 2020-09-24 DIAGNOSIS — D2272 Melanocytic nevi of left lower limb, including hip: Secondary | ICD-10-CM | POA: Diagnosis not present

## 2020-09-24 DIAGNOSIS — D2271 Melanocytic nevi of right lower limb, including hip: Secondary | ICD-10-CM | POA: Diagnosis not present

## 2020-09-24 DIAGNOSIS — L821 Other seborrheic keratosis: Secondary | ICD-10-CM | POA: Diagnosis not present

## 2020-09-24 DIAGNOSIS — D2262 Melanocytic nevi of left upper limb, including shoulder: Secondary | ICD-10-CM | POA: Diagnosis not present

## 2020-11-23 ENCOUNTER — Other Ambulatory Visit: Payer: Self-pay | Admitting: Internal Medicine

## 2020-11-23 DIAGNOSIS — Z1231 Encounter for screening mammogram for malignant neoplasm of breast: Secondary | ICD-10-CM

## 2020-12-08 ENCOUNTER — Other Ambulatory Visit: Payer: Self-pay

## 2020-12-08 ENCOUNTER — Ambulatory Visit
Admission: RE | Admit: 2020-12-08 | Discharge: 2020-12-08 | Disposition: A | Discharge status: Home / Self Care | Payer: Medicare HMO | Source: Ambulatory Visit | Attending: Internal Medicine | Admitting: Internal Medicine

## 2020-12-08 DIAGNOSIS — Z1231 Encounter for screening mammogram for malignant neoplasm of breast: Secondary | ICD-10-CM | POA: Insufficient documentation

## 2020-12-26 ENCOUNTER — Other Ambulatory Visit: Payer: Self-pay | Admitting: Internal Medicine

## 2020-12-31 ENCOUNTER — Other Ambulatory Visit: Payer: Self-pay | Admitting: Cardiovascular Disease

## 2021-01-07 ENCOUNTER — Other Ambulatory Visit: Payer: Self-pay | Admitting: Internal Medicine

## 2021-01-07 DIAGNOSIS — K219 Gastro-esophageal reflux disease without esophagitis: Secondary | ICD-10-CM

## 2021-01-13 ENCOUNTER — Ambulatory Visit (INDEPENDENT_AMBULATORY_CARE_PROVIDER_SITE_OTHER): Payer: Medicare HMO | Admitting: Internal Medicine

## 2021-01-13 ENCOUNTER — Encounter: Payer: Self-pay | Admitting: Internal Medicine

## 2021-01-13 ENCOUNTER — Other Ambulatory Visit: Payer: Self-pay

## 2021-01-13 VITALS — BP 146/68 | HR 78 | Temp 96.4°F | Ht 63.0 in | Wt 137.2 lb

## 2021-01-13 DIAGNOSIS — I700 Atherosclerosis of aorta: Secondary | ICD-10-CM

## 2021-01-13 DIAGNOSIS — R5382 Chronic fatigue, unspecified: Secondary | ICD-10-CM | POA: Diagnosis not present

## 2021-01-13 DIAGNOSIS — E538 Deficiency of other specified B group vitamins: Secondary | ICD-10-CM

## 2021-01-13 DIAGNOSIS — E1169 Type 2 diabetes mellitus with other specified complication: Secondary | ICD-10-CM

## 2021-01-13 DIAGNOSIS — Z23 Encounter for immunization: Secondary | ICD-10-CM

## 2021-01-13 DIAGNOSIS — J9611 Chronic respiratory failure with hypoxia: Secondary | ICD-10-CM | POA: Diagnosis not present

## 2021-01-13 DIAGNOSIS — M810 Age-related osteoporosis without current pathological fracture: Secondary | ICD-10-CM

## 2021-01-13 DIAGNOSIS — R7301 Impaired fasting glucose: Secondary | ICD-10-CM

## 2021-01-13 DIAGNOSIS — E039 Hypothyroidism, unspecified: Secondary | ICD-10-CM | POA: Diagnosis not present

## 2021-01-13 DIAGNOSIS — E785 Hyperlipidemia, unspecified: Secondary | ICD-10-CM

## 2021-01-13 DIAGNOSIS — I152 Hypertension secondary to endocrine disorders: Secondary | ICD-10-CM

## 2021-01-13 DIAGNOSIS — E1159 Type 2 diabetes mellitus with other circulatory complications: Secondary | ICD-10-CM

## 2021-01-13 DIAGNOSIS — I428 Other cardiomyopathies: Secondary | ICD-10-CM

## 2021-01-13 MED ORDER — ATORVASTATIN CALCIUM 20 MG PO TABS: 20.0000 mg | ORAL_TABLET | ORAL | 0 refills | Status: DC

## 2021-01-13 NOTE — Assessment & Plan Note (Addendum)
Untreated due to history of profound myalgias with simvastatin trial.  However she is willing to try  high potency statin  2 times weekly

## 2021-01-13 NOTE — Assessment & Plan Note (Addendum)
Elevated in office today.  Home readings have been  130/80 or less on a validated machine.  No changes today  Lab Results  Component Value Date   CREATININE 0.60 07/10/2020   Lab Results  Component Value Date   NA 134 (L) 07/10/2020   K 4.1 07/10/2020   CL 96 07/10/2020   CO2 29 07/10/2020

## 2021-01-13 NOTE — Patient Instructions (Addendum)
You received the Prevnar 20 today (the last pneumonia ever need) and your annual flu vaccine    The CT Scan you had in 2018 noted aortic atherosclerosis  (placque buildup in your major arteries).  This places you at increased risk for heart attack and stroke even if your cholesterol is low.   I recommend that you attempt a trial of atorvastatin  .  Start with one tablet twice  a week  at bedtime  (NOT DAILY).   If you tolerate taking IT   AFTER A FEW WEEKS increase to EVERY OTHER DAY   Return in 3 weeks for blood tests.  Suspend any  supplements with BIOTIN 3 days prior  because it will affect your thyroid test   ON THE DAY YOU TAKE THE FOSAMAX, take the thyroid medication at 5 am when you wake up.  THE ALTERNATIVE is to take 2 thyroid tablets  the following day

## 2021-01-13 NOTE — Progress Notes (Signed)
Subjective:  Patient ID: Mary Howe, female    DOB: Mar 20, 1933  Age: 87 y.o. MRN: [id number]  CC: The primary encounter diagnosis was Acquired hypothyroidism. Diagnoses of Hypertension associated with type 2 diabetes mellitus ([place]), Hyperlipidemia associated with type 2 diabetes mellitus ([place]), Chronic fatigue, Impaired fasting glucose, B12 deficiency, Need for immunization against influenza, Need for pneumococcal vaccination, Aortic atherosclerosis ([place]), Chronic respiratory failure with hypoxia [facility]), Age related osteoporosis, unspecified pathological fracture presence, and NICM (nonischemic cardiomyopathy) ([place]) were also pertinent to this visit.  HPI Mary Howe presents for  Chief Complaint  Patient presents with   Follow-up    6 month follow up diabetes, hypertension, hypothyroidism, and hyperlipidemia   1) Aortic atherosclerosis: noted on 2018 CT angiogram in the aortic arch and great vessel origins. she is willing to try a high potency statin despite a remote history of statin myalgia  2) T2DM:  reviewed diet.  She is following a moderate low glycemic index diet.  Exercising again through [place] program 3 times per week   3) HTN: home readings have been 130/80 or less on current regimen of bisoprolol   4) COPD with chronic hypoxia: wears oxygen at night and after exertion outside,  when pulse ox is noted to drop to high 80's  and returns to normal with rest.    5) Hypothyroid:  diagnosed in April has been skipping one dose per week on the day she takes alendronate.     Outpatient Medications Prior to Visit  Medication Sig Dispense Refill   acetaminophen (TYLENOL) 500 MG tablet Take 500 mg by mouth every 6 (six) hours as needed.     albuterol (VENTOLIN HFA) 108 (90 Base) MCG/ACT inhaler Inhale 1-2 puffs into the lungs every 4 (four) hours as needed for wheezing or shortness of breath. 1 each 10   alendronate (FOSAMAX) 70 MG tablet Take 1 tablet (70 mg total) by  mouth every 7 (seven) days. Take with a full glass of water on an empty stomach. 4 tablet 11   ALPRAZolam (XANAX) 0.25 MG tablet Take 1 tablet (0.25 mg total) by mouth 2 (two) times daily as needed for anxiety. 20 tablet 0   bisoprolol (ZEBETA) 5 MG tablet TAKE 1/2 TABLET(2.5 MG) BY MOUTH DAILY 45 tablet 1   budesonide (PULMICORT) 0.5 MG/2ML nebulizer solution USE 1 VIAL VIA NEBULIZER TWICE DAILY 120 mL 11   calcium carbonate (OS-CAL) 600 MG TABS tablet Take 600 mg by mouth daily with breakfast.      cetirizine (ZYRTEC) 10 MG tablet Take 1 tablet (10 mg total) by mouth daily. 90 tablet 1   escitalopram (LEXAPRO) 10 MG tablet TAKE 1 TABLET(10 MG) BY MOUTH DAILY 90 tablet 1   furosemide (LASIX) 20 MG tablet TAKE 1 TABLET BY MOUTH DAILY WITH ONE EXTRA TABLET IN THE EVENING FOR SWELLING 180 tablet 1   levothyroxine (SYNTHROID) 25 MCG tablet Take 1 tablet (25 mcg total) by mouth daily at 6 (six) AM. 90 tablet 3   MULTIPLE VITAMIN PO Take 1 tablet by mouth daily.      OXYGEN Inhale 2.5 L daily into the lungs.     pantoprazole (PROTONIX) 40 MG tablet TAKE 1 TABLET(40 MG) BY MOUTH DAILY 90 tablet 1   valACYclovir (VALTREX) 1000 MG tablet Take 1 tablet (1,000 mg total) by mouth 2 (two) times daily. For one week, then reduce use to once daily (Patient taking differently: Take 1,000 mg by mouth daily as needed. For one week, then  reduce use to once daily) 37 tablet 5   No facility-administered medications prior to visit.    Review of Systems;  Patient denies headache, fevers, malaise, unintentional weight loss, skin rash, eye pain, sinus congestion and sinus pain, sore throat, dysphagia,  hemoptysis , cough, dyspnea, wheezing, chest pain, palpitations, orthopnea, edema, abdominal pain, nausea, melena, diarrhea, constipation, flank pain, dysuria, hematuria, urinary  Frequency, nocturia, numbness, tingling, seizures,  Focal weakness, Loss of consciousness,  Tremor, insomnia, depression, anxiety, and suicidal  ideation.      Objective:  BP (!) 146/68 (BP Location: Left Arm, Patient Position: Sitting, Cuff Size: Normal)   Pulse 78   Temp (!) 96.4 F (35.8 C) (Temporal)   Ht 5\' 3"  (1.6 m)   Wt 137 lb 3.2 oz (62.2 kg)   SpO2 96%   BMI 24.30 kg/m   BP Readings from Last 3 Encounters:  01/13/21 (!) 146/68  07/30/20 (!) 148/84  07/22/20 122/60    Wt Readings from Last 3 Encounters:  01/13/21 137 lb 3.2 oz (62.2 kg)  07/30/20 137 lb 9.6 oz (62.4 kg)  07/22/20 138 lb (62.6 kg)    General appearance: alert, cooperative and appears stated age Ears: normal TM's and external ear canals both ears Throat: lips, mucosa, and tongue normal; teeth and gums normal Neck: no adenopathy, no carotid bruit, supple, symmetrical, trachea midline and thyroid not enlarged, symmetric, no tenderness/mass/nodules Back: symmetric, no curvature. ROM normal. No CVA tenderness. Lungs: clear to auscultation bilaterally Heart: regular rate and rhythm, S1, S2 normal, no murmur, click, rub or gallop Abdomen: soft, non-tender; bowel sounds normal; no masses,  no organomegaly Pulses: 2+ and symmetric Skin: Skin color, texture, turgor normal. No rashes or lesions Lymph nodes: Cervical, supraclavicular, and axillary nodes normal.  Lab Results  Component Value Date   HGBA1C 5.7 07/10/2020   HGBA1C 5.9 06/10/2019   HGBA1C 5.9 11/01/2018    Lab Results  Component Value Date   CREATININE 0.60 07/10/2020   CREATININE 0.57 06/10/2019   CREATININE 0.54 11/01/2018    Lab Results  Component Value Date   WBC 5.4 07/10/2020   HGB 14.2 07/10/2020   HCT 41.1 07/10/2020   PLT 218.0 07/10/2020   GLUCOSE 106 (H) 07/10/2020   CHOL 222 (H) 07/10/2020   TRIG 88.0 07/10/2020   HDL 100.10 07/10/2020   LDLDIRECT 89.0 07/01/2016   LDLCALC 105 (H) 07/10/2020   ALT 16 07/10/2020   AST 23 07/10/2020   NA 134 (L) 07/10/2020   K 4.1 07/10/2020   CL 96 07/10/2020   CREATININE 0.60 07/10/2020   BUN 12 07/10/2020   CO2 29  07/10/2020   TSH 3.81 09/08/2020   INR 1.09 01/16/2017   HGBA1C 5.7 07/10/2020   MICROALBUR <0.7 07/10/2020    MM 3D SCREEN BREAST BILATERAL  Result Date: 12/12/2020 CLINICAL DATA:  Screening. EXAM: DIGITAL SCREENING BILATERAL MAMMOGRAM WITH TOMOSYNTHESIS AND CAD TECHNIQUE: Bilateral screening digital craniocaudal and mediolateral oblique mammograms were obtained. Bilateral screening digital breast tomosynthesis was performed. The images were evaluated with computer-aided detection. COMPARISON:  Previous exam(s). ACR Breast Density Category b: There are scattered areas of fibroglandular density. FINDINGS: There are no findings suspicious for malignancy. IMPRESSION: No mammographic evidence of malignancy. A result letter of this screening mammogram will be mailed directly to the patient. RECOMMENDATION: Screening mammogram in one year. (Code:SM-B-01Y) BI-RADS CATEGORY  1: Negative. Electronically Signed   By: Abelardo Diesel M.D.   On: 12/12/2020 22:08   Assessment & Plan:   Problem  List Items Addressed This Visit       Unprioritized   Acquired hypothyroidism - Primary    Managed with low dose levothyroxine,  But has been skipping a dose weekly due to concurrent use of alendronate .  Repeat level in 3 weeks after reusming 7 days per week dosing   Lab Results  Component Value Date   TSH 3.81 09/08/2020         Relevant Orders   TSH   Age related osteoporosis    Denies back pain currently.  Reviewed calcium and vitamin D needs,  And encouraged more regular use of alendronate       Aortic atherosclerosis (HCC)     Aortic atherosclerosis :  Discussed need for statin therapy given documented evidence of moderate  atherosclerosis in the aorta noted on recent imaging study.  She is will to start rosuvastatin  Twice weekly      Relevant Medications   atorvastatin (LIPITOR) 20 MG tablet   Chronic respiratory failure with hypoxia (HCC)    Secondary to advanced COPD.  She is not  hypoxic  at rest or with short walks hypoxic on room. She has a Portable oxygen tank but is not currently using it.       Hyperlipidemia associated with type 2 diabetes mellitus ([place])    Untreated due to history of profound myalgias with simvastatin trial.  However she is willing to try  high potency statin  2 times weekly      Relevant Medications   atorvastatin (LIPITOR) 20 MG tablet   Other Relevant Orders   Lipid panel   Hypertension associated with type 2 diabetes mellitus ([place])    Elevated in office today.  Home readings have been  130/80 or less on a validated machine.  No changes today  Lab Results  Component Value Date   CREATININE 0.60 07/10/2020   Lab Results  Component Value Date   NA 134 (L) 07/10/2020   K 4.1 07/10/2020   CL 96 07/10/2020   CO2 29 07/10/2020         Relevant Medications   atorvastatin (LIPITOR) 20 MG tablet   Other Relevant Orders   Comprehensive metabolic panel   NICM (nonischemic cardiomyopathy) ([place])    Weighing daily and taking 20 mg furosemide daily , last ECHO reviewed Dec 2018 normal EF .  Diastolic dysfunction noted home bps  < 130/80      Relevant Medications   atorvastatin (LIPITOR) 20 MG tablet   Other Visit Diagnoses     Chronic fatigue       Relevant Orders   CBC with Differential/Platelet   Impaired fasting glucose       Relevant Orders   Hemoglobin A1c   B12 deficiency       Relevant Orders   Vitamin B12   Need for immunization against influenza       Relevant Orders   Flu Vaccine QUAD High Dose(Fluad) (Completed)   Need for pneumococcal vaccination       Relevant Orders   Pneumococcal conjugate vaccine 20-valent (Prevnar 20) (Completed)       I spent 30 mintutes dedicated to the care of this patient on the date of this encounter to include pre-visit review of his medical history,  Face-to-face time with the patient , and post visit ordering of testing and therapeutics.  Meds ordered this encounter  Medications    atorvastatin (LIPITOR) 20 MG tablet    Sig: Take 1 tablet (20 mg  total) by mouth every other day.    Dispense:  45 tablet    Refill:  0    There are no discontinued medications.  Follow-up: Return in about 6 months (around 07/14/2021).   [name], MD

## 2021-01-14 DIAGNOSIS — I700 Atherosclerosis of aorta: Secondary | ICD-10-CM | POA: Insufficient documentation

## 2021-01-14 NOTE — Assessment & Plan Note (Addendum)
Secondary to advanced COPD.  She is not  hypoxic at rest or with short walks hypoxic on room. She has a Portable oxygen tank but is not currently using it.

## 2021-01-14 NOTE — Assessment & Plan Note (Signed)
Weighing daily and taking 20 mg furosemide daily , last ECHO reviewed Dec 2018 normal EF .  Diastolic dysfunction noted home bps  < 130/80

## 2021-01-14 NOTE — Assessment & Plan Note (Addendum)
  Aortic atherosclerosis :  Discussed need for statin therapy given documented evidence of moderate  atherosclerosis in the aorta noted on recent imaging study.  She is will to start rosuvastatin  Twice weekly

## 2021-01-14 NOTE — Assessment & Plan Note (Signed)
Managed with low dose levothyroxine,  But has been skipping a dose weekly due to concurrent use of alendronate .  Repeat level in 3 weeks after reusming 7 days per week dosing   Lab Results  Component Value Date   TSH 3.81 09/08/2020

## 2021-01-14 NOTE — Assessment & Plan Note (Signed)
Denies back pain currently.  Reviewed calcium and vitamin D needs,  And encouraged more regular use of alendronate

## 2021-01-25 ENCOUNTER — Other Ambulatory Visit: Payer: Self-pay | Admitting: Internal Medicine

## 2021-02-03 ENCOUNTER — Other Ambulatory Visit: Payer: Medicare HMO

## 2021-02-11 ENCOUNTER — Other Ambulatory Visit (INDEPENDENT_AMBULATORY_CARE_PROVIDER_SITE_OTHER): Payer: Medicare HMO

## 2021-02-11 ENCOUNTER — Other Ambulatory Visit: Payer: Self-pay

## 2021-02-11 DIAGNOSIS — R7301 Impaired fasting glucose: Secondary | ICD-10-CM

## 2021-02-11 DIAGNOSIS — E785 Hyperlipidemia, unspecified: Secondary | ICD-10-CM | POA: Diagnosis not present

## 2021-02-11 DIAGNOSIS — E538 Deficiency of other specified B group vitamins: Secondary | ICD-10-CM

## 2021-02-11 DIAGNOSIS — R5382 Chronic fatigue, unspecified: Secondary | ICD-10-CM

## 2021-02-11 DIAGNOSIS — E1169 Type 2 diabetes mellitus with other specified complication: Secondary | ICD-10-CM | POA: Diagnosis not present

## 2021-02-11 DIAGNOSIS — E1159 Type 2 diabetes mellitus with other circulatory complications: Secondary | ICD-10-CM

## 2021-02-11 DIAGNOSIS — E039 Hypothyroidism, unspecified: Secondary | ICD-10-CM

## 2021-02-11 LAB — CBC WITH DIFFERENTIAL/PLATELET
Basophils Absolute: 0.1 10*3/uL (ref 0.0–0.1)
Basophils Relative: 1.2 % (ref 0.0–3.0)
Eosinophils Absolute: 0.2 10*3/uL (ref 0.0–0.7)
Eosinophils Relative: 2.9 % (ref 0.0–5.0)
HCT: 39.7 % (ref 36.0–46.0)
Hemoglobin: 13.3 g/dL (ref 12.0–15.0)
Lymphocytes Relative: 27.4 % (ref 12.0–46.0)
Lymphs Abs: 1.6 10*3/uL (ref 0.7–4.0)
MCHC: 33.6 g/dL (ref 30.0–36.0)
MCV: 97.5 fl (ref 78.0–100.0)
Monocytes Absolute: 0.5 10*3/uL (ref 0.1–1.0)
Monocytes Relative: 8.2 % (ref 3.0–12.0)
Neutro Abs: 3.4 10*3/uL (ref 1.4–7.7)
Neutrophils Relative %: 60.3 % (ref 43.0–77.0)
Platelets: 234 10*3/uL (ref 150.0–400.0)
RBC: 4.07 Mil/uL (ref 3.87–5.11)
RDW: 13.5 % (ref 11.5–15.5)
WBC: 5.7 10*3/uL (ref 4.0–10.5)

## 2021-02-11 LAB — LIPID PANEL
Cholesterol: 194 mg/dL (ref 0–200)
HDL: 98.6 mg/dL (ref >39.00)
LDL Cholesterol: 79 mg/dL (ref 0–99)
NonHDL: 95.54
Total CHOL/HDL Ratio: 2
Triglycerides: 84 mg/dL (ref 0.0–149.0)
VLDL: 16.8 mg/dL (ref 0.0–40.0)

## 2021-02-11 LAB — COMPREHENSIVE METABOLIC PANEL
ALT: 19 U/L (ref 0–35)
AST: 24 U/L (ref 0–37)
Albumin: 4.1 g/dL (ref 3.5–5.2)
Alkaline Phosphatase: 57 U/L (ref 39–117)
BUN: 9 mg/dL (ref 6–23)
CO2: 30 mEq/L (ref 19–32)
Calcium: 8.6 mg/dL (ref 8.4–10.5)
Chloride: 95 mEq/L — ABNORMAL LOW (ref 96–112)
Creatinine, Ser: 0.58 mg/dL (ref 0.40–1.20)
GFR: 81.94 mL/min (ref >60.00)
Glucose, Bld: 103 mg/dL — ABNORMAL HIGH (ref 70–99)
Potassium: 4.2 mEq/L (ref 3.5–5.1)
Sodium: 132 mEq/L — ABNORMAL LOW (ref 135–145)
Total Bilirubin: 1 mg/dL (ref 0.2–1.2)
Total Protein: 6.5 g/dL (ref 6.0–8.3)

## 2021-02-11 LAB — VITAMIN B12: Vitamin B-12: 238 pg/mL (ref 211–911)

## 2021-02-11 LAB — HEMOGLOBIN A1C: Hgb A1c MFr Bld: 5.9 % (ref 4.6–6.5)

## 2021-02-11 LAB — TSH: TSH: 5.94 u[IU]/mL — ABNORMAL HIGH (ref 0.35–5.50)

## 2021-02-12 ENCOUNTER — Other Ambulatory Visit: Payer: Self-pay | Admitting: Internal Medicine

## 2021-02-12 DIAGNOSIS — E538 Deficiency of other specified B group vitamins: Secondary | ICD-10-CM | POA: Insufficient documentation

## 2021-02-12 MED ORDER — LEVOTHYROXINE SODIUM 50 MCG PO TABS: 50.0000 ug | ORAL_TABLET | Freq: Every day | ORAL | 1 refills | Status: DC

## 2021-02-16 ENCOUNTER — Other Ambulatory Visit: Payer: Self-pay

## 2021-02-16 ENCOUNTER — Ambulatory Visit (INDEPENDENT_AMBULATORY_CARE_PROVIDER_SITE_OTHER): Payer: Medicare HMO

## 2021-02-16 DIAGNOSIS — E538 Deficiency of other specified B group vitamins: Secondary | ICD-10-CM | POA: Diagnosis not present

## 2021-02-16 MED ORDER — CYANOCOBALAMIN 1000 MCG/ML IJ SOLN
1000.0000 ug | Freq: Once | INTRAMUSCULAR | Status: AC
  Administered 2021-02-16: 1000 ug via INTRAMUSCULAR

## 2021-02-16 NOTE — Progress Notes (Signed)
Patient presented for B 12 injection to right deltoid, patient voiced no concerns nor showed any signs of distress during injection. 

## 2021-02-23 ENCOUNTER — Other Ambulatory Visit: Payer: Self-pay

## 2021-02-23 ENCOUNTER — Ambulatory Visit (INDEPENDENT_AMBULATORY_CARE_PROVIDER_SITE_OTHER): Payer: Medicare HMO

## 2021-02-23 DIAGNOSIS — E538 Deficiency of other specified B group vitamins: Secondary | ICD-10-CM

## 2021-02-23 MED ORDER — CYANOCOBALAMIN 1000 MCG/ML IJ SOLN
1000.0000 ug | Freq: Once | INTRAMUSCULAR | Status: AC
  Administered 2021-02-23: 1000 ug via INTRAMUSCULAR

## 2021-02-23 NOTE — Progress Notes (Signed)
Patient presented for B 12 injection to left deltoid, patient voiced no concerns nor showed any signs of distress during injection. 

## 2021-02-25 LAB — FOLATE RBC: RBC Folate: 620 ng/mL RBC (ref >280)

## 2021-02-25 LAB — INTRINSIC FACTOR ANTIBODIES: Intrinsic Factor: NEGATIVE

## 2021-03-02 ENCOUNTER — Other Ambulatory Visit: Payer: Self-pay

## 2021-03-02 ENCOUNTER — Ambulatory Visit (INDEPENDENT_AMBULATORY_CARE_PROVIDER_SITE_OTHER): Payer: Medicare HMO

## 2021-03-02 DIAGNOSIS — E538 Deficiency of other specified B group vitamins: Secondary | ICD-10-CM

## 2021-03-02 MED ORDER — CYANOCOBALAMIN 1000 MCG/ML IJ SOLN
1000.0000 ug | Freq: Once | INTRAMUSCULAR | Status: AC
  Administered 2021-03-02: 1000 ug via INTRAMUSCULAR

## 2021-03-02 NOTE — Progress Notes (Signed)
Patient presented for B 12 injection to right deltoid, patient voiced no concerns nor showed any signs of distress during injection. 

## 2021-03-05 ENCOUNTER — Other Ambulatory Visit: Payer: Self-pay | Admitting: Internal Medicine

## 2021-03-08 ENCOUNTER — Telehealth: Payer: Self-pay | Admitting: Internal Medicine

## 2021-03-08 MED ORDER — PREDNISONE 10 MG PO TABS: ORAL_TABLET | ORAL | 0 refills | Status: DC

## 2021-03-08 MED ORDER — OSELTAMIVIR PHOSPHATE 75 MG PO CAPS: 75.0000 mg | ORAL_CAPSULE | Freq: Two times a day (BID) | ORAL | 0 refills | Status: DC

## 2021-03-08 MED ORDER — AZITHROMYCIN 250 MG PO TABS: ORAL_TABLET | ORAL | 0 refills | Status: AC

## 2021-03-08 NOTE — Telephone Encounter (Signed)
Patient's daughter called stating the patient is having head and chest congestion and cough without and mucus. Patient sent to access nurse.

## 2021-03-08 NOTE — Telephone Encounter (Signed)
My Chart message sent

## 2021-03-08 NOTE — Telephone Encounter (Signed)
Spoke with pt's daughter and stated that the pt developed the head and chest congestion, productive cough with yellow phlegm on Saturday, no fever, no wheezing, no SOBr. She stated that pt is taking Mucinex and using her nebulizer 3 times daily. No appts available they were advised to go to UC but decided that they would wait and see how she feels tomorrow since she is doing "okay".

## 2021-03-09 ENCOUNTER — Ambulatory Visit: Payer: Medicare HMO

## 2021-03-18 ENCOUNTER — Other Ambulatory Visit: Payer: Self-pay

## 2021-03-18 ENCOUNTER — Ambulatory Visit (INDEPENDENT_AMBULATORY_CARE_PROVIDER_SITE_OTHER): Payer: Medicare HMO

## 2021-03-18 DIAGNOSIS — E538 Deficiency of other specified B group vitamins: Secondary | ICD-10-CM | POA: Diagnosis not present

## 2021-03-18 MED ORDER — CYANOCOBALAMIN 1000 MCG/ML IJ SOLN
1000.0000 ug | Freq: Once | INTRAMUSCULAR | Status: AC
Start: 2021-03-18 — End: 2021-03-18
  Administered 2021-03-18: 1000 ug via INTRAMUSCULAR

## 2021-03-18 NOTE — Progress Notes (Signed)
Patient presented for B 12 injection to left deltoid, patient voiced no concerns nor showed any signs of distress during injection. 

## 2021-03-31 ENCOUNTER — Other Ambulatory Visit: Payer: Self-pay | Admitting: Internal Medicine

## 2021-04-13 ENCOUNTER — Other Ambulatory Visit: Payer: Self-pay | Admitting: Internal Medicine

## 2021-06-11 ENCOUNTER — Ambulatory Visit (INDEPENDENT_AMBULATORY_CARE_PROVIDER_SITE_OTHER): Payer: Medicare HMO

## 2021-06-11 VITALS — Ht 63.0 in | Wt 137.0 lb

## 2021-06-11 DIAGNOSIS — Z0000 Encounter for general adult medical examination without abnormal findings: Secondary | ICD-10-CM

## 2021-06-11 NOTE — Progress Notes (Addendum)
Subjective:   Mary Howe is a 88 y.o. female who presents for Medicare Annual (Subsequent) preventive examination.  Review of Systems    No ROS.  Medicare Wellness Virtual Visit.  Visual/audio telehealth visit, UTA vital signs.   See social history for additional risk factors.   Cardiac Risk Factors include: advanced age (>36men, >60 women)     Objective:    Today's Vitals   06/11/21 1118  Weight: 137 lb (62.1 kg)  Height: 5\' 3"  (1.6 m)   Body mass index is 24.27 kg/m.  Advanced Directives 06/11/2021 06/10/2020 06/10/2019 06/07/2018 11/15/2017 10/10/2017 04/18/2017  Does Patient Have a Medical Advance Directive? Yes Yes No Yes Yes Yes Yes  Type of Industrial/product designer of [place];Living will - Living will;Healthcare Power of [place];Living will Healthcare Power of [place];Living will  Does patient want to make changes to medical advance directive? No - Patient declined No - Patient declined - No - Patient declined No - Patient declined No - Patient declined No - Patient declined  Copy of [place] in Chart? No - copy requested No - copy requested - No - copy requested No - copy requested Yes -  Would patient like information on creating a medical advance directive? - - No - Patient declined - - - -    Current Medications (verified) Outpatient Encounter Medications as of 06/11/2021  Medication Sig   acetaminophen (TYLENOL) 500 MG tablet Take 500 mg by mouth every 6 (six) hours as needed.   albuterol (VENTOLIN HFA) 108 (90 Base) MCG/ACT inhaler Inhale 1-2 puffs into the lungs every 4 (four) hours as needed for wheezing or shortness of breath.   alendronate (FOSAMAX) 70 MG tablet TAKE 1 TABLET(70 MG) BY MOUTH EVERY 7 DAYS WITH A FULL GLASS OF WATER AND ON AN EMPTY STOMACH   ALPRAZolam (XANAX) 0.25 MG tablet Take 1 tablet (0.25 mg total) by mouth 2 (two) times daily as  needed for anxiety.   atorvastatin (LIPITOR) 20 MG tablet TAKE 1 TABLET(20 MG) BY MOUTH EVERY OTHER DAY   bisoprolol (ZEBETA) 5 MG tablet TAKE 1/2 TABLET(2.5 MG) BY MOUTH DAILY   budesonide (PULMICORT) 0.5 MG/2ML nebulizer solution USE 1 VIAL VIA NEBULIZER TWICE DAILY   calcium carbonate (OS-CAL) 600 MG TABS tablet Take 600 mg by mouth daily with breakfast.    cetirizine (ZYRTEC) 10 MG tablet Take 1 tablet (10 mg total) by mouth daily.   escitalopram (LEXAPRO) 10 MG tablet TAKE 1 TABLET(10 MG) BY MOUTH DAILY   furosemide (LASIX) 20 MG tablet TAKE 1 TABLET BY MOUTH DAILY WITH ONE EXTRA TABLET IN THE EVENING FOR SWELLING   levothyroxine (SYNTHROID) 50 MCG tablet Take 1 tablet (50 mcg total) by mouth daily at 6 (six) AM.   MULTIPLE VITAMIN PO Take 1 tablet by mouth daily.    oseltamivir (TAMIFLU) 75 MG capsule Take 1 capsule (75 mg total) by mouth 2 (two) times daily. (Patient not taking: Reported on 06/11/2021)   OXYGEN Inhale 2.5 L daily into the lungs.   pantoprazole (PROTONIX) 40 MG tablet TAKE 1 TABLET(40 MG) BY MOUTH DAILY   predniSONE (DELTASONE) 10 MG tablet 6 tablets on Day 1 , then reduce by 1 tablet daily until gone   valACYclovir (VALTREX) 1000 MG tablet Take 1 tablet (1,000 mg total) by mouth 2 (two) times daily. For one week, then reduce use to once daily (Patient taking differently: Take 1,000  mg by mouth daily as needed. For one week, then reduce use to once daily)   No facility-administered encounter medications on file as of 06/11/2021.    Allergies (verified) Simvastatin   History: Past Medical History:  Diagnosis Date   CHF (congestive heart failure) (HCC)    COPD (chronic obstructive pulmonary disease) ([place])    emphysema   Dental bridge present    permanent, lower right   Diverticulitis 2015   Essential hypertension    GERD (gastroesophageal reflux disease)    Hyperlipidemia    PAH (pulmonary artery hypertension) ([facility])    a. 01/2017 Echo: PASP 100mmHg; b. 01/2017 RHC:  mild PAH.   Shingles outbreak 01/30/2016   Takotsubo cardiomyopathy    a. 01/2017 Echo: EF 25-30%, sev glob HK w/ basal regions moving well. Gr1 DD. mild MR, mildly dil LA, nl RV fxn, mild to mod TR, PASP 31mmHg; b. 01/2017 Cath: Nl cors w/ EF 35-45%. HK of distal segments suggestive of Takotsubo CM.    Thyroid disease    Wears hearing aid in both ears    Past Surgical History:  Procedure Laterality Date   CATARACT EXTRACTION W/PHACO Right 10/10/2017   Procedure: CATARACT EXTRACTION PHACO AND INTRAOCULAR LENS PLACEMENT ([place])  RIGHT;  Surgeon: Leandrew Koyanagi, MD;  Location: [facility];  Service: Ophthalmology;  Laterality: Right;   CATARACT EXTRACTION W/PHACO Left 11/15/2017   Procedure: CATARACT EXTRACTION PHACO AND INTRAOCULAR LENS PLACEMENT ([place])  LEFT;  Surgeon: Leandrew Koyanagi, MD;  Location: [facility];  Service: Ophthalmology;  Laterality: Left;   CERVICAL CONE BIOPSY     CHOLECYSTECTOMY     HEMORROIDECTOMY     RIGHT/LEFT HEART CATH AND CORONARY ANGIOGRAPHY N/A 01/16/2017   Procedure: RIGHT/LEFT HEART CATH AND CORONARY ANGIOGRAPHY;  Surgeon: Wellington Hampshire, MD;  Location: [facility];  Service: Cardiovascular;  Laterality: N/A;   Family History  Problem Relation Age of Onset   Prostate cancer Father    Pneumonia Brother    Lung disease Brother    CAD Brother    Heart disease Brother    CAD Brother    Heart attack Brother    Brain cancer Brother    Bone cancer Sister    Emphysema Sister    Fibrocystic breast disease Sister    Aneurysm Sister    Heart disease Sister    Breast cancer Neg Hx    Social History   Socioeconomic History   Marital status: Divorced    Spouse name: Not on file   Number of children: Not on file   Years of education: Not on file   Highest education level: Not on file  Occupational History   Occupation: retired  Tobacco Use   Smoking status: Former    Packs/day: 1.50    Years: 29.00    Pack years: 43.50     Types: Cigarettes    Quit date: 04/11/1988    Years since quitting: 33.1   Smokeless tobacco: Never   Tobacco comments:    quit smoking in 1990  Vaping Use   Vaping Use: Never used  Substance and Sexual Activity   Alcohol use: Yes    Alcohol/week: 7.0 standard drinks    Types: 7 Glasses of wine per week    Comment:     Drug use: No   Sexual activity: Never  Other Topics Concern   Not on file  Social History Narrative   Moved from [place] to [place] in 2014, where she lives with  her 21 y/o sister.  Dtr and family nearby.       Social Determinants of Health   Financial Resource Strain: Low Risk    Difficulty of Paying Living Expenses: Not hard at all  Food Insecurity: No Food Insecurity   Worried About Charity fundraiser in the Last Year: Never true   [place] in the Last Year: Never true  Transportation Needs: No Transportation Needs   Lack of Transportation (Medical): No   Lack of Transportation (Non-Medical): No  Physical Activity: Insufficiently Active   Days of Exercise per Week: 3 days   Minutes of Exercise per Session: 30 min  Stress: No Stress Concern Present   Feeling of Stress : Not at all  Social Connections: Unknown   Frequency of Communication with Friends and Family: More than three times a week   Frequency of Social Gatherings with Friends and Family: More than three times a week   Attends Religious Services: Not on Electrical engineer or Organizations: Not on file   Attends Archivist Meetings: Not on file   Marital Status: Not on file    Tobacco Counseling Counseling given: Not Answered Tobacco comments: quit smoking in 1990   Clinical Intake:  Pre-visit preparation completed: Yes        Diabetes: Yes (Followed by PCP) Nutrition Risk Assessment: Does the patient have any non-healing wounds?  Yes   Financial Strains and Diabetes Management: Does the patient want to be seen by Chronic Care Management for management  of their diabetes?  No  Would the patient like to be referred to a Nutritionist or for Diabetic Management?  No    How often do you need to have someone help you when you read instructions, pamphlets, or other written materials from your doctor or pharmacy?: 1 - Never   Interpreter Needed?: No      Activities of Daily Living In your present state of health, do you have any difficulty performing the following activities: 06/11/2021  Hearing? Y  Comment Hearing aids  Vision? N  Difficulty concentrating or making decisions? N  Walking or climbing stairs? N  Dressing or bathing? N  Doing errands, shopping? N  Preparing Food and eating ? N  Using the Toilet? N  In the past six months, have you accidently leaked urine? N  Do you have problems with loss of bowel control? N  Managing your Medications? N  Managing your Finances? N  Housekeeping or managing your Housekeeping? N  Some recent data might be hidden    Patient Care Team: [name], MD as PCP - General (Internal Medicine)  Indicate any recent Medical Services you may have received from other than [facility] providers in the past year (date may be approximate).     Assessment:   This is a routine wellness examination for [place].  Virtual Visit via Telephone Note  I connected with  Mary Howe on 06/11/21 at 11:15 AM EST by telephone and verified that I am speaking with the correct person using two identifiers.  Persons participating in the virtual visit: patient/Nurse Health Advisor   I discussed the limitations, risks, security and privacy concerns of performing an evaluation and management service by telephone and the availability of in person appointments. The patient expressed understanding and agreed to proceed.  Interactive audio and video telecommunications were attempted between this nurse and patient, however failed, due to patient having technical difficulties OR patient did not  have access to video  capability.  We continued and completed visit with audio only.  Some vital signs may be absent or patient reported.   Hearing/Vision screen Hearing Screening - Comments:: Hearing aid, bilateral  Vision Screening - Comments:: Wears corrective lenses Cataract extraction, bilateral  They have seen their ophthalmologist.   Dietary issues and exercise activities discussed: Current Exercise Habits: Home exercise routine, Type of exercise: walking, Intensity: Mild Healthy diet Good water intake   Goals Addressed               This Visit's Progress     Patient Stated     Increase physical activity (pt-stated)        Strength training exercise, 3 days weekly 45 minutes       Depression Screen PHQ 2/9 Scores 06/11/2021 01/13/2021 07/30/2020 06/10/2020 06/10/2019 09/07/2018 06/07/2018  PHQ - 2 Score 0 0 0 0 0 0 0  PHQ- 9 Score - - 0 - - - -    Fall Risk Fall Risk  06/11/2021 01/13/2021 07/30/2020 07/10/2020 06/10/2020  Falls in the past year? 0 0 0 0 0  Number falls in past yr: 0 - - - 0  Injury with Fall? - - - - 0  Risk Factor Category  - - - - -  Risk for fall due to : - - - - -  Risk for fall due to: Comment - - - - -  Follow up Falls evaluation completed Falls evaluation completed Falls evaluation completed Falls evaluation completed Falls evaluation completed    [place]: Home free of loose throw rugs in walkways, pet beds, electrical cords, etc? Yes  Adequate lighting in your home to reduce risk of falls? Yes   ASSISTIVE DEVICES UTILIZED TO PREVENT FALLS: Life alert? No  Use of a cane, walker or w/c? No  Grab bars in the bathroom? Yes   TIMED UP AND GO: Was the test performed? No .   Cognitive Function: MMSE - Mini Mental State Exam 09/20/2016  Orientation to time 5  Orientation to Place 5  Registration 3  Attention/ Calculation 5  Recall 3  Language- name 2 objects 2  Language- repeat 1  Language- follow 3 step command 3  Language-  read & follow direction 1  Write a sentence 1  Copy design 1  Total score 30     6CIT Screen 06/11/2021 06/10/2020 06/10/2019 06/07/2018  What Year? 0 points 0 points 0 points 0 points  What month? 0 points 0 points 0 points 0 points  What time? 0 points 0 points 0 points 0 points  Count back from 20 - - 0 points 0 points  Months in reverse 0 points 0 points 0 points 0 points  Repeat phrase - - 0 points 0 points  Total Score - - 0 0    Immunizations Immunization History  Administered Date(s) Administered   Fluad Quad(high Dose 65+) 01/24/2020, 01/13/2021   Influenza, High Dose Seasonal PF 02/23/2015, 01/18/2016, 12/14/2016, 01/18/2018   Influenza-Unspecified 01/23/2019   PFIZER(Purple Top)SARS-COV-2 Vaccination 05/03/2019, 05/24/2019, 01/13/2020   PNEUMOCOCCAL CONJUGATE-20 01/13/2021   Pneumococcal Conjugate-13 10/21/2013   Pneumococcal Polysaccharide-23 05/04/2016   Tdap 07/22/2019   Zoster Recombinat (Shingrix) 10/30/2018, 01/08/2019   Zoster, Live 01/30/2012   Screening Tests Health Maintenance  Topic Date Due   URINE MICROALBUMIN  07/10/2021   COVID-19 Vaccine (4 - Booster for [place] series) 06/27/2021 (Originally 03/09/2020)   OPHTHALMOLOGY EXAM  06/28/2021 (Originally 06/25/2020)  FOOT EXAM  07/10/2021   HEMOGLOBIN A1C  08/11/2021   MAMMOGRAM  12/08/2021   TETANUS/TDAP  07/21/2029   Pneumonia Vaccine 63+ Years old  Completed   INFLUENZA VACCINE  Completed   DEXA SCAN  Completed   Zoster Vaccines- Shingrix  Completed   HPV VACCINES  Aged Out   Health Maintenance Health Maintenance Due  Topic Date Due   URINE MICROALBUMIN  07/10/2021   Lung Cancer Screening: (Low Dose CT Chest recommended if Age 12-80 years, 30 pack-year currently smoking OR have quit w/in 15years.) does not qualify.   Hepatitis C Screening: does not qualify  Vision Screening: Recommended annual ophthalmology exams for early detection of glaucoma and other disorders of the eye.  Dental Screening:  Recommended annual dental exams for proper oral hygiene  Community Resource Referral / Chronic Care Management: CRR required this visit?  No   CCM required this visit?  No      Plan:     I have personally reviewed and noted the following in the patients chart:   Medical and social history Use of alcohol, tobacco or illicit drugs  Current medications and supplements including opioid prescriptions.  Functional ability and status Nutritional status Physical activity Advanced directives List of other physicians Hospitalizations, surgeries, and ER visits in previous 12 months Vitals Screenings to include cognitive, depression, and falls Referrals and appointments  In addition, I have reviewed and discussed with patient certain preventive protocols, quality metrics, and best practice recommendations. A written personalized care plan for preventive services as well as general preventive health recommendations were provided to patient.     OBrien-Blaney, Brylan Dec L, LPN   07/17/8889     I have reviewed the above information and agree with above.   Deborra Medina, MD

## 2021-06-11 NOTE — Patient Instructions (Addendum)
Mary Howe , Thank you for taking time to come for your Medicare Wellness Visit. I appreciate your ongoing commitment to your health goals. Please review the following plan we discussed and let me know if I can assist you in the future.   These are the goals we discussed:  Goals       Patient Stated     Increase physical activity (pt-stated)      Strength training exercise, 3 days weekly 45 minutes        This is a list of the screening recommended for you and due dates:  Health Maintenance  Topic Date Due   Urine Protein Check  07/10/2021   COVID-19 Vaccine (4 - Booster for Pfizer series) 06/27/2021*   Eye exam for diabetics  06/28/2021*   Complete foot exam   07/10/2021   Hemoglobin A1C  08/11/2021   Mammogram  12/08/2021   Tetanus Vaccine  07/21/2029   Pneumonia Vaccine  Completed   Flu Shot  Completed   DEXA scan (bone density measurement)  Completed   Zoster (Shingles) Vaccine  Completed   HPV Vaccine  Aged Out  *Topic was postponed. The date shown is not the original due date.    Advanced directives: End of life planning; Advance aging; Advanced directives discussed.  Copy of current HCPOA/Living Will requested.    Conditions/risks identified: none new  Next appointment: Follow up in one year for your annual wellness visit    Preventive Care 65 Years and Older, Female Preventive care refers to lifestyle choices and visits with your health care provider that can promote health and wellness. What does preventive care include? A yearly physical exam. This is also called an annual well check. Dental exams once or twice a year. Routine eye exams. Ask your health care provider how often you should have your eyes checked. Personal lifestyle choices, including: Daily care of your teeth and gums. Regular physical activity. Eating a healthy diet. Avoiding tobacco and drug use. Limiting alcohol use. Practicing safe sex. Taking low-dose aspirin every day. Taking vitamin  and mineral supplements as recommended by your health care provider. What happens during an annual well check? The services and screenings done by your health care provider during your annual well check will depend on your age, overall health, lifestyle risk factors, and family history of disease. Counseling  Your health care provider may ask you questions about your: Alcohol use. Tobacco use. Drug use. Emotional well-being. Home and relationship well-being. Sexual activity. Eating habits. History of falls. Memory and ability to understand (cognition). Work and work Statistician. Reproductive health. Screening  You may have the following tests or measurements: Height, weight, and BMI. Blood pressure. Lipid and cholesterol levels. These may be checked every 5 years, or more frequently if you are over 73 years old. Skin check. Lung cancer screening. You may have this screening every year starting at age 50 if you have a 30-pack-year history of smoking and currently smoke or have quit within the past 15 years. Fecal occult blood test (FOBT) of the stool. You may have this test every year starting at age 12. Flexible sigmoidoscopy or colonoscopy. You may have a sigmoidoscopy every 5 years or a colonoscopy every 10 years starting at age 71. Hepatitis C blood test. Hepatitis B blood test. Sexually transmitted disease (STD) testing. Diabetes screening. This is done by checking your blood sugar (glucose) after you have not eaten for a while (fasting). You may have this done every 1-3 years. Bone  density scan. This is done to screen for osteoporosis. You may have this done starting at age 62. Mammogram. This may be done every 1-2 years. Talk to your health care provider about how often you should have regular mammograms. Talk with your health care provider about your test results, treatment options, and if necessary, the need for more tests. Vaccines  Your health care provider may recommend  certain vaccines, such as: Influenza vaccine. This is recommended every year. Tetanus, diphtheria, and acellular pertussis (Tdap, Td) vaccine. You may need a Td booster every 10 years. Zoster vaccine. You may need this after age 2. Pneumococcal 13-valent conjugate (PCV13) vaccine. One dose is recommended after age 24. Pneumococcal polysaccharide (PPSV23) vaccine. One dose is recommended after age 56. Talk to your health care provider about which screenings and vaccines you need and how often you need them. This information is not intended to replace advice given to you by your health care provider. Make sure you discuss any questions you have with your health care provider. Document Released: 04/24/2015 Document Revised: 12/16/2015 Document Reviewed: 01/27/2015 Elsevier Interactive Patient Education  2017 [place] Prevention in the Home Falls can cause injuries. They can happen to people of all ages. There are many things you can do to make your home safe and to help prevent falls. What can I do on the outside of my home? Regularly fix the edges of walkways and driveways and fix any cracks. Remove anything that might make you trip as you walk through a door, such as a raised step or threshold. Trim any bushes or trees on the path to your home. Use bright outdoor lighting. Clear any walking paths of anything that might make someone trip, such as rocks or tools. Regularly check to see if handrails are loose or broken. Make sure that both sides of any steps have handrails. Any raised decks and porches should have guardrails on the edges. Have any leaves, snow, or ice cleared regularly. Use sand or salt on walking paths during winter. Clean up any spills in your garage right away. This includes oil or grease spills. What can I do in the bathroom? Use night lights. Install grab bars by the toilet and in the tub and shower. Do not use towel bars as grab bars. Use non-skid mats or  decals in the tub or shower. If you need to sit down in the shower, use a plastic, non-slip stool. Keep the floor dry. Clean up any water that spills on the floor as soon as it happens. Remove soap buildup in the tub or shower regularly. Attach bath mats securely with double-sided non-slip rug tape. Do not have throw rugs and other things on the floor that can make you trip. What can I do in the bedroom? Use night lights. Make sure that you have a light by your bed that is easy to reach. Do not use any sheets or blankets that are too big for your bed. They should not hang down onto the floor. Have a firm chair that has side arms. You can use this for support while you get dressed. Do not have throw rugs and other things on the floor that can make you trip. What can I do in the kitchen? Clean up any spills right away. Avoid walking on wet floors. Keep items that you use a lot in easy-to-reach places. If you need to reach something above you, use a strong step stool that has a grab bar. Keep  electrical cords out of the way. Do not use floor polish or wax that makes floors slippery. If you must use wax, use non-skid floor wax. Do not have throw rugs and other things on the floor that can make you trip. What can I do with my stairs? Do not leave any items on the stairs. Make sure that there are handrails on both sides of the stairs and use them. Fix handrails that are broken or loose. Make sure that handrails are as long as the stairways. Check any carpeting to make sure that it is firmly attached to the stairs. Fix any carpet that is loose or worn. Avoid having throw rugs at the top or bottom of the stairs. If you do have throw rugs, attach them to the floor with carpet tape. Make sure that you have a light switch at the top of the stairs and the bottom of the stairs. If you do not have them, ask someone to add them for you. What else can I do to help prevent falls? Wear shoes that: Do not  have high heels. Have rubber bottoms. Are comfortable and fit you well. Are closed at the toe. Do not wear sandals. If you use a stepladder: Make sure that it is fully opened. Do not climb a closed stepladder. Make sure that both sides of the stepladder are locked into place. Ask someone to hold it for you, if possible. Clearly mark and make sure that you can see: Any grab bars or handrails. First and last steps. Where the edge of each step is. Use tools that help you move around (mobility aids) if they are needed. These include: Canes. Walkers. Scooters. Crutches. Turn on the lights when you go into a dark area. Replace any light bulbs as soon as they burn out. Set up your furniture so you have a clear path. Avoid moving your furniture around. If any of your floors are uneven, fix them. If there are any pets around you, be aware of where they are. Review your medicines with your doctor. Some medicines can make you feel dizzy. This can increase your chance of falling. Ask your doctor what other things that you can do to help prevent falls. This information is not intended to replace advice given to you by your health care provider. Make sure you discuss any questions you have with your health care provider. Document Released: 01/22/2009 Document Revised: 09/03/2015 Document Reviewed: 05/02/2014 Elsevier Interactive Patient Education  2017 [employer].

## 2021-06-28 DIAGNOSIS — Z0100 Encounter for examination of eyes and vision without abnormal findings: Secondary | ICD-10-CM | POA: Diagnosis not present

## 2021-06-28 DIAGNOSIS — H26493 Other secondary cataract, bilateral: Secondary | ICD-10-CM | POA: Diagnosis not present

## 2021-07-08 DIAGNOSIS — H26491 Other secondary cataract, right eye: Secondary | ICD-10-CM | POA: Diagnosis not present

## 2021-07-10 ENCOUNTER — Other Ambulatory Visit: Payer: Self-pay | Admitting: Internal Medicine

## 2021-07-14 ENCOUNTER — Ambulatory Visit: Payer: Medicare HMO | Admitting: Internal Medicine

## 2021-07-18 ENCOUNTER — Other Ambulatory Visit: Payer: Self-pay | Admitting: Cardiovascular Disease

## 2021-07-19 NOTE — Telephone Encounter (Signed)
Patient daughter will call to schedule .  ?

## 2021-07-19 NOTE — Telephone Encounter (Signed)
Please contact pt for future appointment. ?Pt due for appointment. ?

## 2021-07-23 NOTE — Telephone Encounter (Signed)
Attempted to schedule.  [place] for daughter on DPR to call office.  ? ?

## 2021-07-28 ENCOUNTER — Ambulatory Visit (INDEPENDENT_AMBULATORY_CARE_PROVIDER_SITE_OTHER): Payer: Medicare HMO | Admitting: Internal Medicine

## 2021-07-28 ENCOUNTER — Encounter: Payer: Self-pay | Admitting: Internal Medicine

## 2021-07-28 VITALS — BP 140/74 | HR 73 | Temp 98.4°F | Ht 63.0 in | Wt 142.0 lb

## 2021-07-28 DIAGNOSIS — R251 Tremor, unspecified: Secondary | ICD-10-CM | POA: Diagnosis not present

## 2021-07-28 DIAGNOSIS — Z1231 Encounter for screening mammogram for malignant neoplasm of breast: Secondary | ICD-10-CM | POA: Diagnosis not present

## 2021-07-28 DIAGNOSIS — M810 Age-related osteoporosis without current pathological fracture: Secondary | ICD-10-CM | POA: Diagnosis not present

## 2021-07-28 DIAGNOSIS — Z79899 Other long term (current) drug therapy: Secondary | ICD-10-CM

## 2021-07-28 DIAGNOSIS — E1159 Type 2 diabetes mellitus with other circulatory complications: Secondary | ICD-10-CM | POA: Diagnosis not present

## 2021-07-28 DIAGNOSIS — J430 Unilateral pulmonary emphysema [MacLeod's syndrome]: Secondary | ICD-10-CM

## 2021-07-28 DIAGNOSIS — E119 Type 2 diabetes mellitus without complications: Secondary | ICD-10-CM | POA: Diagnosis not present

## 2021-07-28 DIAGNOSIS — E538 Deficiency of other specified B group vitamins: Secondary | ICD-10-CM

## 2021-07-28 DIAGNOSIS — I700 Atherosclerosis of aorta: Secondary | ICD-10-CM

## 2021-07-28 DIAGNOSIS — E785 Hyperlipidemia, unspecified: Secondary | ICD-10-CM | POA: Diagnosis not present

## 2021-07-28 DIAGNOSIS — Z9181 History of falling: Secondary | ICD-10-CM

## 2021-07-28 DIAGNOSIS — J9611 Chronic respiratory failure with hypoxia: Secondary | ICD-10-CM

## 2021-07-28 DIAGNOSIS — I428 Other cardiomyopathies: Secondary | ICD-10-CM

## 2021-07-28 DIAGNOSIS — I152 Hypertension secondary to endocrine disorders: Secondary | ICD-10-CM

## 2021-07-28 DIAGNOSIS — E1169 Type 2 diabetes mellitus with other specified complication: Secondary | ICD-10-CM | POA: Diagnosis not present

## 2021-07-28 DIAGNOSIS — E039 Hypothyroidism, unspecified: Secondary | ICD-10-CM

## 2021-07-28 NOTE — Assessment & Plan Note (Addendum)
Historically well-controlled on diet alone .  hemoglobin A1c has been consistently at or  less than 7.0 . Patient is up-to-date on eye exams and foot exam is normal today. Patient has no microalbuminuria. Patient is tolerating every other day statin therapy for CAD risk reduction. ? ?Lab Results  ?Component Value Date  ? HGBA1C 5.8 07/28/2021  ? ?. ?Lab Results  ?Component Value Date  ? CHOL 186 07/28/2021  ? HDL 98.40 07/28/2021  ? [place] 79 02/11/2021  ? LDLDIRECT 70.0 07/28/2021  ? TRIG 204.0 (H) 07/28/2021  ? CHOLHDL 2 07/28/2021  ? ?Lab Results  ?Component Value Date  ? MICROALBUR <0.7 07/28/2021  ? MICROALBUR <0.7 07/10/2020  ? ? ? ?

## 2021-07-28 NOTE — Progress Notes (Signed)
? ?Subjective:  ?Patient ID: Mary Howe, female    DOB: 1932-09-15  Age: 88 y.o. MRN: [id number] ? ?CC: The primary encounter diagnosis was Acquired hypothyroidism. Diagnoses of Hyperlipidemia associated with type 2 diabetes mellitus ([place]), Hypertension associated with type 2 diabetes mellitus ([place]), Diabetes mellitus without complication ([place]), Long-term use of high-risk medication, Encounter for screening mammogram for malignant neoplasm of breast, Age-related osteoporosis without current pathological fracture, Aortic atherosclerosis ([place]), Tremor of left hand, Chronic respiratory failure with hypoxia ([place]), B12 deficiency, Unilateral emphysema ([place]), NICM (nonischemic cardiomyopathy) ([place]), and History of fall within past 90 days were also pertinent to this visit. ? ? ?This visit occurred during the SARS-CoV-2 public health emergency.  Safety protocols were in place, including screening questions prior to the visit, additional usage of staff PPE, and extensive cleaning of exam room while observing appropriate contact time as indicated for disinfecting solutions.   ? ?HPI ?Mary Howe presents for follow up on type 2 DM,  COPD, osteoporosis and aortic atherosclerosis.  ?Chief Complaint  ?Patient presents with  ? Follow-up  ?  6 month follow up   ? ?Cc:  she reports having Had a fall while leaving church last Sunday.  The fall was not caused by dizziness or weakness.  She states that she did not see the step while walking up to the pulpit and fell upward onto a carpeted stair .  Struck her lower lip on a pew which bled briefly.  She also struck her right shoulder on the pew.  She states that she did not experience any loss of l ROM of shoulder  ? ?1) aortic atherosclerosis:   she is taking lipitor 20 mg daily  without side effects  ? ?2) type 2 DM: det controlled .  Diet  reviewed:  weight gain noted,  has resumed exercising at the [facility] gym facility    .  Drinks  2-3 small glasses of wine  per night  discussed  more moderate use of wine.  ? ?3)  COPD with chronic hypoxic respiratory failure:  she is  requiring supplemental oxygen at  night but not during the day, ,even with moderate activity at the gym 9she states that her pulse ox is checked while she is exercising )  .  She has not had any cough lately and denies wheezing and dyspnea.  ? ?4) osteoporosis :  reviewed her 2017 DEXA with her today.  T score was  -2.4 alendronate was started  ? ?5) she has developed a slight tremor of the left hand.  It is present at rest and with activity.  ?Outpatient Medications Prior to Visit  ?Medication Sig Dispense Refill  ? acetaminophen (TYLENOL) 500 MG tablet Take 500 mg by mouth every 6 (six) hours as needed.    ? albuterol (VENTOLIN HFA) 108 (90 Base) MCG/ACT inhaler Inhale 1-2 puffs into the lungs every 4 (four) hours as needed for wheezing or shortness of breath. 1 each 10  ? alendronate (FOSAMAX) 70 MG tablet TAKE 1 TABLET(70 MG) BY MOUTH EVERY 7 DAYS WITH A FULL GLASS OF WATER AND ON AN EMPTY STOMACH 4 tablet 11  ? ALPRAZolam (XANAX) 0.25 MG tablet Take 1 tablet (0.25 mg total) by mouth 2 (two) times daily as needed for anxiety. 20 tablet 0  ? atorvastatin (LIPITOR) 20 MG tablet TAKE 1 TABLET(20 MG) BY MOUTH EVERY OTHER DAY 45 tablet 3  ? bisoprolol (ZEBETA) 5 MG tablet TAKE 1/2 TABLET(2.5 MG) BY MOUTH DAILY 45 tablet 1  ?  budesonide (PULMICORT) 0.5 MG/2ML nebulizer solution USE 1 VIAL VIA NEBULIZER TWICE DAILY 120 mL 11  ? calcium carbonate (OS-CAL) 600 MG TABS tablet Take 600 mg by mouth daily with breakfast.     ? cetirizine (ZYRTEC) 10 MG tablet Take 1 tablet (10 mg total) by mouth daily. 90 tablet 1  ? escitalopram (LEXAPRO) 10 MG tablet TAKE 1 TABLET(10 MG) BY MOUTH DAILY 90 tablet 1  ? furosemide (LASIX) 20 MG tablet TAKE 1 TABLET BY MOUTH DAILY WITH 1 EXTRA TABLET IN THE EVENING FOR SWELLING 180 tablet 0  ? levothyroxine (SYNTHROID) 50 MCG tablet Take 1 tablet (50 mcg total) by mouth daily at 6 (six) AM. 90 tablet 1   ? MULTIPLE VITAMIN PO Take 1 tablet by mouth daily.     ? OXYGEN Inhale 2.5 L daily into the lungs.    ? pantoprazole (PROTONIX) 40 MG tablet TAKE 1 TABLET(40 MG) BY MOUTH DAILY 90 tablet 1  ? valACYclovir (VALTREX) 1000 MG tablet Take 1 tablet (1,000 mg total) by mouth 2 (two) times daily. For one week, then reduce use to once daily (Patient taking differently: Take 1,000 mg by mouth daily as needed. For one week, then reduce use to once daily) 37 tablet 5  ? oseltamivir (TAMIFLU) 75 MG capsule Take 1 capsule (75 mg total) by mouth 2 (two) times daily. (Patient not taking: Reported on 07/28/2021) 10 capsule 0  ? predniSONE (DELTASONE) 10 MG tablet 6 tablets on Day 1 , then reduce by 1 tablet daily until gone (Patient not taking: Reported on 07/28/2021) 21 tablet 0  ? ?No facility-administered medications prior to visit.  ? ? ?Review of Systems; ? ?Patient denies headache, fevers, malaise, unintentional weight loss, skin rash, eye pain, sinus congestion and sinus pain, sore throat, dysphagia,  hemoptysis , cough, dyspnea, wheezing, chest pain, palpitations, orthopnea, edema, abdominal pain, nausea, melena, diarrhea, constipation, flank pain, dysuria, hematuria, urinary  Frequency, nocturia, numbness, tingling, seizures,  Focal weakness, Loss of consciousness,  insomnia, depression, anxiety, and suicidal ideation.   ? ? ? ?Objective:  ?BP 140/74 (BP Location: Left Arm, Patient Position: Sitting, Cuff Size: Normal)   Pulse 73   Temp 98.4 ?F (36.9 ?C) (Oral)   Ht '5\' 3"'$  (1.6 m)   Wt 142 lb (64.4 kg)   SpO2 93%   BMI 25.15 kg/m?  ? ?BP Readings from Last 3 Encounters:  ?07/28/21 140/74  ?01/13/21 (!) 146/68  ?07/30/20 (!) 148/84  ? ? ?Wt Readings from Last 3 Encounters:  ?07/28/21 142 lb (64.4 kg)  ?06/11/21 137 lb (62.1 kg)  ?01/13/21 137 lb 3.2 oz (62.2 kg)  ? ? ?General appearance: alert, cooperative and appears stated age ?Ears: normal TM's and external ear canals both ears ?Throat: lips, mucosa, and tongue  normal; teeth and gums normal ?Neck: no adenopathy, no carotid bruit, supple, symmetrical, trachea midline and thyroid not enlarged, symmetric, no tenderness/mass/nodules ?Back: symmetric, no curvature. ROM normal. No CVA tenderness. ?Lungs: clear to auscultation bilaterally ?Heart: regular rate and rhythm, S1, S2 normal, no murmur, click, rub or gallop ?Abdomen: soft, non-tender; bowel sounds normal; no masses,  no organomegaly ?Pulses: 2+ and symmetric ?Skin: Skin color, texture, turgor normal. No rashes or lesions ?Lymph nodes: Cervical, supraclavicular, and axillary nodes normal. ?Neuro:  awake and interactive with normal mood and affect. Higher cortical functions are normal. Speech is clear without word-finding difficulty or dysarthria. Extraocular movements are intact. Visual fields of both eyes are grossly intact. Sensation to light touch is  grossly intact bilaterally of upper and lower extremities. Motor examination shows 4+/5 symmetric hand grip and upper extremity and 5/5 lower extremity strength. There is no pronation or drift. Gait is non-ataxic .  Has a tremor of left hand at rest and with effort .  Aggravated by pointing during nose to finger exam.  ? ?Lab Results  ?Component Value Date  ? HGBA1C 5.8 07/28/2021  ? HGBA1C 5.9 02/11/2021  ? HGBA1C 5.7 07/10/2020  ? ? ?Lab Results  ?Component Value Date  ? CREATININE 0.60 07/28/2021  ? CREATININE 0.58 02/11/2021  ? CREATININE 0.60 07/10/2020  ? ? ?Lab Results  ?Component Value Date  ? WBC 7.6 07/28/2021  ? HGB 14.0 07/28/2021  ? HCT 41.3 07/28/2021  ? PLT 231.0 07/28/2021  ? GLUCOSE 97 07/28/2021  ? CHOL 186 07/28/2021  ? TRIG 204.0 (H) 07/28/2021  ? HDL 98.40 07/28/2021  ? LDLDIRECT 70.0 07/28/2021  ? [place] 79 02/11/2021  ? ALT 21 07/28/2021  ? AST 24 07/28/2021  ? NA 133 (L) 07/28/2021  ? K 3.8 07/28/2021  ? CL 94 (L) 07/28/2021  ? CREATININE 0.60 07/28/2021  ? BUN 16 07/28/2021  ? CO2 28 07/28/2021  ? TSH 0.98 07/28/2021  ? INR 1.09 01/16/2017  ?  HGBA1C 5.8 07/28/2021  ? MICROALBUR <0.7 07/28/2021  ? ? ?MM 3D SCREEN BREAST BILATERAL ? ?Result Date: 12/12/2020 ?CLINICAL DATA:  Screening. EXAM: DIGITAL SCREENING BILATERAL MAMMOGRAM WITH TOMOSYNTHESIS AND CAD TE

## 2021-07-28 NOTE — Patient Instructions (Addendum)
You have been taking alendronate since 2017,  I want you to suspend  the alendronate for one year ? ?We will repeat bone density test this summer  (see below) ? ? ?Your  DEXA scan  been ordered.  You are encouraged (required) to call to schedule your own  appointment at [facility]  , and their phone number is [phone]  ?

## 2021-07-28 NOTE — Assessment & Plan Note (Signed)
Using oxygen at night only ?

## 2021-07-28 NOTE — Assessment & Plan Note (Addendum)
Both at rest and with intention.  Gait normal.    No clinical signs of parkinsonism ?

## 2021-07-28 NOTE — Assessment & Plan Note (Addendum)
?  Aortic atherosclerosis :  She is tolerating atorvastatin 2 -3 times weekly  ?

## 2021-07-28 NOTE — Assessment & Plan Note (Signed)
Treated with MI injectiosn initially,  Now taking 2500 mg daily orally ?

## 2021-07-29 DIAGNOSIS — Z9181 History of falling: Secondary | ICD-10-CM | POA: Insufficient documentation

## 2021-07-29 LAB — COMPREHENSIVE METABOLIC PANEL
ALT: 21 U/L (ref 0–35)
AST: 24 U/L (ref 0–37)
Albumin: 4.4 g/dL (ref 3.5–5.2)
Alkaline Phosphatase: 55 U/L (ref 39–117)
BUN: 16 mg/dL (ref 6–23)
CO2: 28 mEq/L (ref 19–32)
Calcium: 9.5 mg/dL (ref 8.4–10.5)
Chloride: 94 mEq/L — ABNORMAL LOW (ref 96–112)
Creatinine, Ser: 0.6 mg/dL (ref 0.40–1.20)
GFR: 81.01 mL/min (ref >60.00)
Glucose, Bld: 97 mg/dL (ref 70–99)
Potassium: 3.8 mEq/L (ref 3.5–5.1)
Sodium: 133 mEq/L — ABNORMAL LOW (ref 135–145)
Total Bilirubin: 0.9 mg/dL (ref 0.2–1.2)
Total Protein: 6.9 g/dL (ref 6.0–8.3)

## 2021-07-29 LAB — CBC WITH DIFFERENTIAL/PLATELET
Basophils Absolute: 0.1 10*3/uL (ref 0.0–0.1)
Basophils Relative: 1.2 % (ref 0.0–3.0)
Eosinophils Absolute: 0.1 10*3/uL (ref 0.0–0.7)
Eosinophils Relative: 1.1 % (ref 0.0–5.0)
HCT: 41.3 % (ref 36.0–46.0)
Hemoglobin: 14 g/dL (ref 12.0–15.0)
Lymphocytes Relative: 26.9 % (ref 12.0–46.0)
Lymphs Abs: 2 10*3/uL (ref 0.7–4.0)
MCHC: 33.8 g/dL (ref 30.0–36.0)
MCV: 97.9 fl (ref 78.0–100.0)
Monocytes Absolute: 0.6 10*3/uL (ref 0.1–1.0)
Monocytes Relative: 8.5 % (ref 3.0–12.0)
Neutro Abs: 4.7 10*3/uL (ref 1.4–7.7)
Neutrophils Relative %: 62.3 % (ref 43.0–77.0)
Platelets: 231 10*3/uL (ref 150.0–400.0)
RBC: 4.22 Mil/uL (ref 3.87–5.11)
RDW: 14.1 % (ref 11.5–15.5)
WBC: 7.6 10*3/uL (ref 4.0–10.5)

## 2021-07-29 LAB — LIPID PANEL
Cholesterol: 186 mg/dL (ref 0–200)
HDL: 98.4 mg/dL (ref >39.00)
NonHDL: 87.17
Total CHOL/HDL Ratio: 2
Triglycerides: 204 mg/dL — ABNORMAL HIGH (ref 0.0–149.0)
VLDL: 40.8 mg/dL — ABNORMAL HIGH (ref 0.0–40.0)

## 2021-07-29 LAB — MICROALBUMIN / CREATININE URINE RATIO
Creatinine,U: 30.1 mg/dL
Microalb Creat Ratio: 2.3 mg/g (ref 0.0–30.0)
Microalb, Ur: <0.7 mg/dL (ref 0.0–1.9)

## 2021-07-29 LAB — TSH: TSH: 0.98 u[IU]/mL (ref 0.35–5.50)

## 2021-07-29 LAB — HEMOGLOBIN A1C: Hgb A1c MFr Bld: 5.8 % (ref 4.6–6.5)

## 2021-07-29 LAB — VITAMIN B12: Vitamin B-12: 440 pg/mL (ref 211–911)

## 2021-07-29 LAB — LDL CHOLESTEROL, DIRECT: Direct LDL: 70 mg/dL

## 2021-07-29 NOTE — Assessment & Plan Note (Signed)
mechanical not due to dizziness or weakness .  No injuries sustained  ?

## 2021-07-29 NOTE — Assessment & Plan Note (Addendum)
Secondary to pulmonary hypertension and diastolic dysfunction.  Continue BP control ,  noted home bps  < 130/80 with bisoprolol and prn furosemide ? ?. ?

## 2021-07-29 NOTE — Assessment & Plan Note (Signed)
Secondary to tobacco abuse.  She is asymptomatic and not  hypoxic at rest or with short walks hypoxic on room. She has a Portable oxygen tank but is not currently using it. Continue nocturnal use of supplemental oxygen and  ICS  And bronchodilators.  ?

## 2021-07-29 NOTE — Assessment & Plan Note (Addendum)
She is due for alendronate holiday and repeat DEXA .was done Sept 2023

## 2021-07-30 DIAGNOSIS — H26492 Other secondary cataract, left eye: Secondary | ICD-10-CM | POA: Diagnosis not present

## 2021-08-01 ENCOUNTER — Other Ambulatory Visit: Payer: Self-pay | Admitting: Internal Medicine

## 2021-08-01 DIAGNOSIS — K219 Gastro-esophageal reflux disease without esophagitis: Secondary | ICD-10-CM

## 2021-08-03 NOTE — Telephone Encounter (Signed)
Unable to schedule yet.  Patient daughter will call back soon .  Closing encounter.  ?

## 2021-08-03 NOTE — Telephone Encounter (Signed)
Per patient daughter will schedule  ?

## 2021-08-13 NOTE — Progress Notes (Signed)
Cardiology Office Note ? ?Date:  08/16/2021  ? ?ID:  Mary Howe, DOB [birth year], MRN [id number] ? ?PCP:  [name], MD  ? ?Chief Complaint  ?Patient presents with  ? 12 month follow up   ?  "Doing well." Medications reviewed by the patient verbally.   ? ? ?HPI:  ?88 y.o. female with a history of  ?COPD,  ?2018 with acute on chronic hypoxic and hypercapnic respiratory failure in the setting of atypical pulmonary infection and AE COPD. Acute encephalopathy and sepsis with hypotension requiring vasopressors, EF 25% ?hypertension,  ?hyperlipidemia,  ?GERD,  ?mild pulmonary hypertension,  ?diagnosis of Takotsubo cardiomyopathy  ?Ef 60% in 01/2017, up from 25% in October 2018 ?Nonischemic by cardiac catheterization October 2018 ?who presents for follow-up of her cardiomyopathy ? ?Last seen by myself in clinic April 2022 ?Prior records reviewed ?echocardiogram December 2018, ejection fraction 60% ? ? presents with her daughter ?Goes to the gym 3x a week, other days is also active ?Denies significant shortness of breath on exertion, no chest pain, lots of steps ?Stairs at her house ? ?BP at home 952 to 841L systolics, if it does run high he will improve if she sits for few minutes ? ?Continues to live with sister, stress associated with this, sister is 71 ? ?Labs: Reviewed ?CBC nl ?Total chol 186 ?A1C 5.8 ?LDL 70, tolerating low-dose statin ? ?Prior imaging reviewed ?CT chest 2018: mild diffuse aortic atherosclerosis ? ?No edema,  ? ?Lasix 20 daily, denies leg swelling ?Rarely extra Lasix after lunch ? ?EKG personally reviewed by myself on todays visit ?Shows normal sinus rhythm. 71 bpm.  No significant ST-T wave changes ? ?Cardiac catheterization January 16, 2017, normal coronary arteries, ejection fraction 35 to 45% with hypokinesis of distal segments suggestive of stress-induced cardiomyopathy ? ?OTHER PAST MEDICAL HISTORY REVIEWED BY ME FOR TODAY'S VISIT: ?Lots of stress, takes care of sister who is 53 ?Doing  pulmonary rehab, exercising well, feels good ? ?Follow-up echocardiogram 03/16/2017 with ejection fraction 55-60%. Using oxygen at nighttime alone ? ?Echocardiogram on 01/12/2017 showed LV dysfunction with an EF of 25-30% with severe global hypokinesis. Troponin 0.54.  ? ?Diagnostic catheterization on 01/16/2017 revealed normal coronary arteries, normal right heart filling pressures, and mild pulmonary hypertension.  EF on ventriculography has improved some to 35-45%.  ? ?October 2018 admitted with acute on chronic hypoxic and hypercapnic respiratory failure in the setting of atypical pulmonary infection and AE COPD. Acute encephalopathy and sepsis with hypotension requiring vasopressors. She was discharged on low-dose bisoprolol and losartan. The losartan was discontinued in the setting of soft blood pressures. ? ?PMH:   has a past medical history of CHF (congestive heart failure) ([place]), COPD (chronic obstructive pulmonary disease) ([place]), Dental bridge present, Diverticulitis (2015), Essential hypertension, GERD (gastroesophageal reflux disease), Hyperlipidemia, PAH (pulmonary artery hypertension) ([place]), Shingles outbreak (01/30/2016), Takotsubo cardiomyopathy, Thyroid disease, and Wears hearing aid in both ears. ? ?PSH:    ?Past Surgical History:  ?Procedure Laterality Date  ? CATARACT EXTRACTION W/PHACO Right 10/10/2017  ? Procedure: CATARACT EXTRACTION PHACO AND INTRAOCULAR LENS PLACEMENT ([place])  RIGHT;  Surgeon: Leandrew Koyanagi, MD;  Location: [place];  Service: Ophthalmology;  Laterality: Right;  ? CATARACT EXTRACTION W/PHACO Left 11/15/2017  ? Procedure: CATARACT EXTRACTION PHACO AND INTRAOCULAR LENS PLACEMENT ([place])  LEFT;  Surgeon: Leandrew Koyanagi, MD;  Location: [place];  Service: Ophthalmology;  Laterality: Left;  ? CERVICAL CONE BIOPSY    ? CHOLECYSTECTOMY    ? HEMORROIDECTOMY    ?  RIGHT/LEFT HEART CATH AND CORONARY ANGIOGRAPHY N/A 01/16/2017  ? Procedure: RIGHT/LEFT HEART CATH  AND CORONARY ANGIOGRAPHY;  Surgeon: Wellington Hampshire, MD;  Location: [place] [facility];  Service: Cardiovascular;  Laterality: N/A;  ? ? ?Current Outpatient Medications  ?Medication Sig Dispense Refill  ? acetaminophen (TYLENOL) 500 MG tablet Take 500 mg by mouth every 6 (six) hours as needed.    ? albuterol (VENTOLIN HFA) 108 (90 Base) MCG/ACT inhaler Inhale 1-2 puffs into the lungs every 4 (four) hours as needed for wheezing or shortness of breath. 1 each 10  ? alendronate (FOSAMAX) 70 MG tablet TAKE 1 TABLET(70 MG) BY MOUTH EVERY 7 DAYS WITH A FULL GLASS OF WATER AND ON AN EMPTY STOMACH 4 tablet 11  ? ALPRAZolam (XANAX) 0.25 MG tablet Take 1 tablet (0.25 mg total) by mouth 2 (two) times daily as needed for anxiety. 20 tablet 0  ? atorvastatin (LIPITOR) 20 MG tablet TAKE 1 TABLET(20 MG) BY MOUTH EVERY OTHER DAY 45 tablet 3  ? bisoprolol (ZEBETA) 5 MG tablet TAKE 1/2 TABLET(2.5 MG) BY MOUTH DAILY 45 tablet 1  ? budesonide (PULMICORT) 0.5 MG/2ML nebulizer solution USE 1 VIAL VIA NEBULIZER TWICE DAILY 120 mL 11  ? calcium carbonate (OS-CAL) 600 MG TABS tablet Take 600 mg by mouth daily with breakfast.     ? cetirizine (ZYRTEC) 10 MG tablet Take 1 tablet (10 mg total) by mouth daily. 90 tablet 1  ? escitalopram (LEXAPRO) 10 MG tablet TAKE 1 TABLET(10 MG) BY MOUTH DAILY 90 tablet 1  ? furosemide (LASIX) 20 MG tablet TAKE 1 TABLET BY MOUTH DAILY WITH 1 EXTRA TABLET IN THE EVENING FOR SWELLING 180 tablet 0  ? levothyroxine (SYNTHROID) 50 MCG tablet Take 1 tablet (50 mcg total) by mouth daily at 6 (six) AM. 90 tablet 1  ? MULTIPLE VITAMIN PO Take 1 tablet by mouth daily.     ? OXYGEN Inhale 2.5 L daily into the lungs.    ? pantoprazole (PROTONIX) 40 MG tablet TAKE 1 TABLET(40 MG) BY MOUTH DAILY 90 tablet 1  ? valACYclovir (VALTREX) 1000 MG tablet Take 1 tablet (1,000 mg total) by mouth 2 (two) times daily. For one week, then reduce use to once daily (Patient not taking: Reported on 08/16/2021) 37 tablet 5  ? ?No current  facility-administered medications for this visit.  ? ?Allergies:   Simvastatin  ? ?Social History:  The patient  reports that she quit smoking about 33 years ago. Her smoking use included cigarettes. She has a 43.50 pack-year smoking history. She has never used smokeless tobacco. She reports current alcohol use of about 7.0 standard drinks per week. She reports that she does not use drugs.  ? ?Family History:   family history includes Aneurysm in her sister; Bone cancer in her sister; Brain cancer in her brother; CAD in her brother and brother; Emphysema in her sister; Fibrocystic breast disease in her sister; Heart attack in her brother; Heart disease in her brother and sister; Lung disease in her brother; Pneumonia in her brother; Prostate cancer in her father.  ? ? ?Review of Systems  ?Constitutional: Negative.   ?HENT: Negative.    ?Respiratory: Negative.    ?Cardiovascular: Negative.   ?Gastrointestinal: Negative.   ?Musculoskeletal: Negative.   ?Neurological: Negative.   ?Psychiatric/Behavioral: Negative.    ?All other systems reviewed and are negative. ? ?PHYSICAL EXAM: ?VS:  BP 140/62 (BP Location: Left Arm, Patient Position: Sitting, Cuff Size: Normal)   Pulse 71  Ht '5\' 4"'$  (1.626 m)   Wt 141 lb 6 oz (64.1 kg)   SpO2 97%   BMI 24.27 kg/m?  , BMI Body mass index is 24.27 kg/m?[place] Kitchen ?Constitutional:  oriented to person, place, and time. No distress.  ?HENT:  ?Head: Grossly normal ?Eyes:  no discharge. No scleral icterus.  ?Neck: No JVD, no carotid bruits  ?Cardiovascular: Regular rate and rhythm, no murmurs appreciated ?Pulmonary/Chest: Clear to auscultation bilaterally, no wheezes or rails ?Abdominal: Soft.  no distension.  no tenderness.  ?Musculoskeletal: Normal range of motion ?Neurological:  normal muscle tone. Coordination normal. No atrophy ?Skin: Skin warm and dry ?Psychiatric: normal affect, pleasant ? ? ?Recent Labs: ?07/28/2021: ALT 21; BUN 16; Creatinine, Ser 0.60; Hemoglobin 14.0; Platelets  231.0; Potassium 3.8; Sodium 133; TSH 0.98  ? ? ?Lipid Panel ?Lab Results  ?Component Value Date  ? CHOL 186 07/28/2021  ? HDL 98.40 07/28/2021  ? [place] 79 02/11/2021  ? TRIG 204.0 (H) 07/28/2021  ? ? ?Wt Readings f

## 2021-08-16 ENCOUNTER — Ambulatory Visit: Payer: Medicare HMO | Admitting: Cardiovascular Disease

## 2021-08-16 ENCOUNTER — Encounter: Payer: Self-pay | Admitting: Cardiovascular Disease

## 2021-08-16 VITALS — BP 140/62 | HR 71 | Ht 64.0 in | Wt 141.4 lb

## 2021-08-16 DIAGNOSIS — E7800 Pure hypercholesterolemia, unspecified: Secondary | ICD-10-CM | POA: Diagnosis not present

## 2021-08-16 DIAGNOSIS — J430 Unilateral pulmonary emphysema [MacLeod's syndrome]: Secondary | ICD-10-CM

## 2021-08-16 DIAGNOSIS — I700 Atherosclerosis of aorta: Secondary | ICD-10-CM

## 2021-08-16 DIAGNOSIS — I10 Essential (primary) hypertension: Secondary | ICD-10-CM | POA: Diagnosis not present

## 2021-08-16 DIAGNOSIS — I428 Other cardiomyopathies: Secondary | ICD-10-CM | POA: Diagnosis not present

## 2021-08-16 MED ORDER — FUROSEMIDE 20 MG PO TABS: ORAL_TABLET | ORAL | 3 refills | Status: DC

## 2021-08-16 NOTE — Patient Instructions (Signed)
Medication Instructions:  No changes  If you need a refill on your cardiac medications before your next appointment, please call your pharmacy.   Lab work: No new labs needed  Testing/Procedures: No new testing needed  Follow-Up: At [facility], you and your health needs are our priority.  As part of our continuing mission to provide you with exceptional heart care, we have created designated Provider Care Teams.  These Care Teams include your primary Cardiologist (physician) and Advanced Practice Providers (APPs -  Physician Assistants and Nurse Practitioners) who all work together to provide you with the care you need, when you need it.  You will need a follow up appointment in 12 months  Providers on your designated Care Team:   Christopher Berge, NP Ryan Dunn, PA-C Cadence Furth, PA-C  COVID-19 Vaccine Information can be found at: [email or web address] For questions related to vaccine distribution or appointments, please email [email or web address] or call [phone].   

## 2021-08-20 DIAGNOSIS — Z0100 Encounter for examination of eyes and vision without abnormal findings: Secondary | ICD-10-CM | POA: Diagnosis not present

## 2021-08-31 ENCOUNTER — Other Ambulatory Visit: Payer: Self-pay | Admitting: Internal Medicine

## 2021-10-13 ENCOUNTER — Other Ambulatory Visit: Payer: Self-pay | Admitting: Internal Medicine

## 2021-10-15 ENCOUNTER — Other Ambulatory Visit: Payer: Self-pay | Admitting: Internal Medicine

## 2021-10-19 DIAGNOSIS — Z0100 Encounter for examination of eyes and vision without abnormal findings: Secondary | ICD-10-CM | POA: Diagnosis not present

## 2021-10-28 DIAGNOSIS — D2262 Melanocytic nevi of left upper limb, including shoulder: Secondary | ICD-10-CM | POA: Diagnosis not present

## 2021-10-28 DIAGNOSIS — D225 Melanocytic nevi of trunk: Secondary | ICD-10-CM | POA: Diagnosis not present

## 2021-10-28 DIAGNOSIS — D2271 Melanocytic nevi of right lower limb, including hip: Secondary | ICD-10-CM | POA: Diagnosis not present

## 2021-10-28 DIAGNOSIS — D2272 Melanocytic nevi of left lower limb, including hip: Secondary | ICD-10-CM | POA: Diagnosis not present

## 2021-10-28 DIAGNOSIS — D2261 Melanocytic nevi of right upper limb, including shoulder: Secondary | ICD-10-CM | POA: Diagnosis not present

## 2021-10-28 DIAGNOSIS — L821 Other seborrheic keratosis: Secondary | ICD-10-CM | POA: Diagnosis not present

## 2021-10-28 DIAGNOSIS — X32XXXA Exposure to sunlight, initial encounter: Secondary | ICD-10-CM | POA: Diagnosis not present

## 2021-10-28 DIAGNOSIS — L570 Actinic keratosis: Secondary | ICD-10-CM | POA: Diagnosis not present

## 2021-10-28 LAB — HM DIABETES EYE EXAM

## 2021-10-31 ENCOUNTER — Other Ambulatory Visit: Payer: Self-pay | Admitting: Internal Medicine

## 2021-10-31 DIAGNOSIS — K219 Gastro-esophageal reflux disease without esophagitis: Secondary | ICD-10-CM

## 2021-12-09 ENCOUNTER — Ambulatory Visit
Admission: RE | Admit: 2021-12-09 | Discharge: 2021-12-09 | Disposition: A | Discharge status: Home / Self Care | Payer: Medicare HMO | Source: Ambulatory Visit | Attending: Internal Medicine | Admitting: Internal Medicine

## 2021-12-09 DIAGNOSIS — M810 Age-related osteoporosis without current pathological fracture: Secondary | ICD-10-CM | POA: Insufficient documentation

## 2021-12-09 DIAGNOSIS — Z1231 Encounter for screening mammogram for malignant neoplasm of breast: Secondary | ICD-10-CM | POA: Insufficient documentation

## 2021-12-09 DIAGNOSIS — M8589 Other specified disorders of bone density and structure, multiple sites: Secondary | ICD-10-CM | POA: Diagnosis not present

## 2021-12-09 DIAGNOSIS — Z780 Asymptomatic menopausal state: Secondary | ICD-10-CM | POA: Diagnosis not present

## 2021-12-10 ENCOUNTER — Emergency Department (HOSPITAL_COMMUNITY)
Admission: EM | Admit: 2021-12-10 | Discharge: 2021-12-11 | Disposition: A | Discharge status: Home / Self Care | Payer: Medicare HMO | Attending: Emergency Medicine | Admitting: Emergency Medicine

## 2021-12-10 ENCOUNTER — Emergency Department (HOSPITAL_COMMUNITY): Payer: Medicare HMO

## 2021-12-10 DIAGNOSIS — R0689 Other abnormalities of breathing: Secondary | ICD-10-CM | POA: Diagnosis not present

## 2021-12-10 DIAGNOSIS — W19XXXA Unspecified fall, initial encounter: Secondary | ICD-10-CM | POA: Diagnosis not present

## 2021-12-10 DIAGNOSIS — Z79899 Other long term (current) drug therapy: Secondary | ICD-10-CM | POA: Insufficient documentation

## 2021-12-10 DIAGNOSIS — I110 Hypertensive heart disease with heart failure: Secondary | ICD-10-CM | POA: Diagnosis not present

## 2021-12-10 DIAGNOSIS — Z7951 Long term (current) use of inhaled steroids: Secondary | ICD-10-CM | POA: Insufficient documentation

## 2021-12-10 DIAGNOSIS — I509 Heart failure, unspecified: Secondary | ICD-10-CM | POA: Diagnosis not present

## 2021-12-10 DIAGNOSIS — S199XXA Unspecified injury of neck, initial encounter: Secondary | ICD-10-CM | POA: Diagnosis not present

## 2021-12-10 DIAGNOSIS — Y92009 Unspecified place in unspecified non-institutional (private) residence as the place of occurrence of the external cause: Secondary | ICD-10-CM | POA: Diagnosis not present

## 2021-12-10 DIAGNOSIS — J439 Emphysema, unspecified: Secondary | ICD-10-CM | POA: Diagnosis not present

## 2021-12-10 DIAGNOSIS — R609 Edema, unspecified: Secondary | ICD-10-CM | POA: Diagnosis not present

## 2021-12-10 DIAGNOSIS — I6782 Cerebral ischemia: Secondary | ICD-10-CM | POA: Diagnosis not present

## 2021-12-10 DIAGNOSIS — S0591XA Unspecified injury of right eye and orbit, initial encounter: Secondary | ICD-10-CM | POA: Diagnosis present

## 2021-12-10 DIAGNOSIS — S01111A Laceration without foreign body of right eyelid and periocular area, initial encounter: Secondary | ICD-10-CM | POA: Diagnosis not present

## 2021-12-10 DIAGNOSIS — I10 Essential (primary) hypertension: Secondary | ICD-10-CM | POA: Diagnosis not present

## 2021-12-10 DIAGNOSIS — E871 Hypo-osmolality and hyponatremia: Secondary | ICD-10-CM | POA: Diagnosis not present

## 2021-12-10 DIAGNOSIS — J449 Chronic obstructive pulmonary disease, unspecified: Secondary | ICD-10-CM | POA: Diagnosis not present

## 2021-12-10 DIAGNOSIS — S0292XA Unspecified fracture of facial bones, initial encounter for closed fracture: Secondary | ICD-10-CM | POA: Diagnosis not present

## 2021-12-10 DIAGNOSIS — S0240CA Maxillary fracture, right side, initial encounter for closed fracture: Secondary | ICD-10-CM | POA: Diagnosis not present

## 2021-12-10 DIAGNOSIS — S0511XA Contusion of eyeball and orbital tissues, right eye, initial encounter: Secondary | ICD-10-CM | POA: Diagnosis not present

## 2021-12-10 LAB — CBC WITH DIFFERENTIAL/PLATELET
Abs Immature Granulocytes: 0.06 10*3/uL (ref 0.00–0.07)
Basophils Absolute: 0.1 10*3/uL (ref 0.0–0.1)
Basophils Relative: 1 %
Eosinophils Absolute: 0.1 10*3/uL (ref 0.0–0.5)
Eosinophils Relative: 2 %
HCT: 41.3 % (ref 36.0–46.0)
Hemoglobin: 13.7 g/dL (ref 12.0–15.0)
Immature Granulocytes: 1 %
Lymphocytes Relative: 26 %
Lymphs Abs: 1.8 10*3/uL (ref 0.7–4.0)
MCH: 33.8 pg (ref 26.0–34.0)
MCHC: 33.2 g/dL (ref 30.0–36.0)
MCV: 102 fL — ABNORMAL HIGH (ref 80.0–100.0)
Monocytes Absolute: 0.5 10*3/uL (ref 0.1–1.0)
Monocytes Relative: 7 %
Neutro Abs: 4.3 10*3/uL (ref 1.7–7.7)
Neutrophils Relative %: 63 %
Platelets: 210 10*3/uL (ref 150–400)
RBC: 4.05 MIL/uL (ref 3.87–5.11)
RDW: 13.7 % (ref 11.5–15.5)
WBC: 6.8 10*3/uL (ref 4.0–10.5)
nRBC: 0 % (ref 0.0–0.2)

## 2021-12-10 LAB — COMPREHENSIVE METABOLIC PANEL
ALT: 32 U/L (ref 0–44)
AST: 43 U/L — ABNORMAL HIGH (ref 15–41)
Albumin: 3.8 g/dL (ref 3.5–5.0)
Alkaline Phosphatase: 49 U/L (ref 38–126)
Anion gap: 12 (ref 5–15)
BUN: 11 mg/dL (ref 8–23)
CO2: 22 mmol/L (ref 22–32)
Calcium: 9 mg/dL (ref 8.9–10.3)
Chloride: 106 mmol/L (ref 98–111)
Creatinine, Ser: 0.58 mg/dL (ref 0.44–1.00)
GFR, Estimated: >60 mL/min (ref >60)
Glucose, Bld: 125 mg/dL — ABNORMAL HIGH (ref 70–99)
Potassium: 4.2 mmol/L (ref 3.5–5.1)
Sodium: 140 mmol/L (ref 135–145)
Total Bilirubin: 0.4 mg/dL (ref 0.3–1.2)
Total Protein: 6.3 g/dL — ABNORMAL LOW (ref 6.5–8.1)

## 2021-12-10 LAB — ETHANOL: Alcohol, Ethyl (B): 173 mg/dL — ABNORMAL HIGH (ref <10)

## 2021-12-10 NOTE — ED Provider Notes (Signed)
[facility] EMERGENCY DEPARTMENT Provider Note   CSN: [id number] Arrival date & time: 12/10/21  2158     History  Chief Complaint  Patient presents with   Lytle Michaels    Mary Howe is a 88 y.o. female.   Pt is a 88y/o female with hx of CHF, COPD, hypertension, GERD, Hyperlipidemia, PAH, Takotsubo cardiomyopathy, Thyroid disease who is presenting tonight via EMS after a fall at home.  Patient's sister initially called the ambulance after patient had a reported fall however when paramedics arrived the patient's sister was having a seizure which required intubation and no further history was able to be obtained.  Patient reports she cannot remember why she fell tonight.  She did admit to drinking some wine tonight but cannot remember how much.  She denies any pain in her arms legs, abdomen or chest.  She denies feeling short of breath at this time.  Paramedics report when they arrived patient was laying on a couch with a c-collar but because her sister required more attention they had not been able to fully assess her.  Patient did have a low oxygen saturation per paramedics but unclear what the O2 sat was on room air.  They report in route she has had hypertension but otherwise normal heart rate and she has been asking repetitive questions but has been mentating.  They did speak with family who confirmed she was not taking anticoagulation.  The history is provided by the patient.  Fall       Home Medications Prior to Admission medications   Medication Sig Start Date End Date Taking? Authorizing Provider  acetaminophen (TYLENOL) 500 MG tablet Take 500 mg by mouth every 6 (six) hours as needed.    [provider]  albuterol (VENTOLIN HFA) 108 (90 Base) MCG/ACT inhaler Inhale 1-2 puffs into the lungs every 4 (four) hours as needed for wheezing or shortness of breath. 07/10/20   [name], MD  alendronate (FOSAMAX) 70 MG tablet TAKE 1 TABLET(70 MG) BY MOUTH EVERY 7  DAYS WITH A FULL GLASS OF WATER AND ON AN EMPTY STOMACH 03/06/21   [name], MD  ALPRAZolam Duanne Moron) 0.25 MG tablet Take 1 tablet (0.25 mg total) by mouth 2 (two) times daily as needed for anxiety. 07/10/20   [name], MD  atorvastatin (LIPITOR) 20 MG tablet TAKE 1 TABLET(20 MG) BY MOUTH EVERY OTHER DAY 04/14/21   [name], MD  bisoprolol (ZEBETA) 5 MG tablet TAKE 1/2 TABLET(2.5 MG) BY MOUTH DAILY 10/13/21   [name], MD  budesonide (PULMICORT) 0.5 MG/2ML nebulizer solution USE 1 VIAL VIA NEBULIZER TWICE DAILY 01/26/21   [name], MD  calcium carbonate (OS-CAL) 600 MG TABS tablet Take 600 mg by mouth daily with breakfast.  02/26/13   [provider]  cetirizine (ZYRTEC) 10 MG tablet Take 1 tablet (10 mg total) by mouth daily. 03/01/19   [name], MD  escitalopram (LEXAPRO) 10 MG tablet TAKE 1 TABLET(10 MG) BY MOUTH DAILY 10/15/21   [name], MD  furosemide (LASIX) 20 MG tablet TAKE 1 TABLET BY MOUTH DAILY WITH 1 EXTRA TABLET IN THE EVENING FOR SWELLING 08/16/21   Minna Merritts, MD  levothyroxine (SYNTHROID) 50 MCG tablet TAKE 1 TABLET(50 MCG) BY MOUTH DAILY AT 6 AM 08/31/21   [name], MD  MULTIPLE VITAMIN PO Take 1 tablet by mouth daily.  02/26/13   [provider]  OXYGEN Inhale 2.5 L  daily into the lungs.    [provider]  pantoprazole (PROTONIX) 40 MG tablet TAKE 1 TABLET(40 MG) BY MOUTH DAILY 10/31/21   Dutch Quint B, FNP  valACYclovir (VALTREX) 1000 MG tablet Take 1 tablet (1,000 mg total) by mouth 2 (two) times daily. For one week, then reduce use to once daily Patient not taking: Reported on 08/16/2021 03/14/19   [name], MD      Allergies    Simvastatin    Review of Systems   Review of Systems  Physical Exam Updated Vital Signs BP (!) 156/83   Pulse 85   Temp 98 F (36.7 C) (Oral)   Resp 17   SpO2 97%  Physical Exam Vitals and nursing note reviewed.  Constitutional:      General: She is not  in acute distress.    Appearance: She is well-developed.     Comments: Repetitive questioning but GCS of 15  HENT:     Head: Normocephalic and atraumatic.     Comments: Periorbital hematoma on the right with 2 cm laceration at the edge of the right eyebrow    Mouth/Throat:     Mouth: Mucous membranes are moist.     Pharynx: Oropharynx is clear.  Eyes:     Extraocular Movements: Extraocular movements intact.     Conjunctiva/sclera: Conjunctivae normal.     Pupils: Pupils are equal, round, and reactive to light.     Comments: Pupils are 3 mm bilaterally and reactive  Cardiovascular:     Rate and Rhythm: Normal rate and regular rhythm.     Heart sounds: Normal heart sounds. No murmur heard.    No friction rub.  Pulmonary:     Effort: Pulmonary effort is normal.     Breath sounds: Normal breath sounds. No wheezing or rales.  Abdominal:     General: Bowel sounds are normal. There is no distension.     Palpations: Abdomen is soft.     Tenderness: There is no abdominal tenderness. There is no guarding or rebound.  Musculoskeletal:        General: No tenderness. Normal range of motion.     Cervical back: Neck supple. No tenderness.     Right lower leg: No edema.     Left lower leg: No edema.     Comments: No edema.  No notable bruises on upper or lower extremities.  Able to fully range shoulders, elbows, wrists, hips, knees and ankles bilaterally without evidence of pain.  Skin:    General: Skin is warm and dry.     Findings: No rash.  Neurological:     Mental Status: She is alert.     Cranial Nerves: No cranial nerve deficit.     Comments: Patient is oriented to person and place.  She is noted to move all extremities and she can follow commands  Psychiatric:     Comments: Patient is in good spirits and giggles intermittently but is calm and cooperative     ED Results / Procedures / Treatments   Labs (all labs ordered are listed, but only abnormal results are displayed) Labs  Reviewed  CBC WITH DIFFERENTIAL/PLATELET - Abnormal; Notable for the following components:      Result Value   MCV 102.0 (*)    All other components within normal limits  COMPREHENSIVE METABOLIC PANEL - Abnormal; Notable for the following components:   Glucose, Bld 125 (*)    Total Protein 6.3 (*)    AST 43 (*)  All other components within normal limits  ETHANOL - Abnormal; Notable for the following components:   Alcohol, Ethyl (B) 173 (*)    All other components within normal limits    EKG EKG Interpretation  Date/Time:  Friday December 10 2021 22:16:09 EDT Ventricular Rate:  91 PR Interval:  202 QRS Duration: 87 QT Interval:  373 QTC Calculation: 459 R Axis:   -3 Text Interpretation: Sinus rhythm Ventricular premature complex Aberrant conduction of SV complex(es) Probable left atrial enlargement No significant change since last tracing Confirmed by Blanchie Dessert ([id number]) on 12/10/2021 10:41:29 PM  Radiology CT Head Wo Contrast  Result Date: 12/10/2021 CLINICAL DATA:  Trauma. EXAM: CT HEAD WITHOUT CONTRAST CT MAXILLOFACIAL WITHOUT CONTRAST CT CERVICAL SPINE WITHOUT CONTRAST TECHNIQUE: Multidetector CT imaging of the head, cervical spine, and maxillofacial structures were performed using the standard protocol without intravenous contrast. Multiplanar CT image reconstructions of the cervical spine and maxillofacial structures were also generated. RADIATION DOSE REDUCTION: This exam was performed according to the departmental dose-optimization program which includes automated exposure control, adjustment of the mA and/or kV according to patient size and/or use of iterative reconstruction technique. COMPARISON:  None Available. FINDINGS: CT HEAD FINDINGS Brain: Mild age-related atrophy and chronic microvascular ischemic changes. There is no acute intracranial hemorrhage. No mass effect or midline shift. No extra-axial fluid collection. Vascular: No hyperdense vessel or unexpected  calcification. Skull: Normal. Negative for fracture or focal lesion. Other: Right periorbital hematoma. CT MAXILLOFACIAL FINDINGS Osseous: There are fractures of the anterior and posterolateral walls of the right maxillary sinus. Probable extension of the fracture into the right nasal maxillary junction. No other acute fracture. No mandibular dislocation. Orbits: There is mild stranding of the right retro-orbital fat. The globes appear intact. Sinuses: Small air-fluid level in the right maxillary sinus, likely sinus. The remainder of the visualized paranasal sinuses and mastoid air cells are clear. Soft tissues: Right periorbital hematoma. CT CERVICAL SPINE FINDINGS Alignment: No acute subluxation. Skull base and vertebrae: No acute fracture.  Osteopenia. Soft tissues and spinal canal: No prevertebral fluid or swelling. No visible canal hematoma. Disc levels:  No acute findings.  Degenerative changes Upper chest: Emphysema. Other: None IMPRESSION: 1. No acute intracranial pathology. Mild age-related atrophy and chronic microvascular ischemic changes. 2. Fractures of the anterior and posterolateral walls of the right maxillary sinus. 3. No acute/traumatic cervical spine pathology. Electronically Signed   By: Anner Crete M.D.   On: 12/10/2021 23:02   CT Maxillofacial Wo Contrast  Result Date: 12/10/2021 CLINICAL DATA:  Trauma. EXAM: CT HEAD WITHOUT CONTRAST CT MAXILLOFACIAL WITHOUT CONTRAST CT CERVICAL SPINE WITHOUT CONTRAST TECHNIQUE: Multidetector CT imaging of the head, cervical spine, and maxillofacial structures were performed using the standard protocol without intravenous contrast. Multiplanar CT image reconstructions of the cervical spine and maxillofacial structures were also generated. RADIATION DOSE REDUCTION: This exam was performed according to the departmental dose-optimization program which includes automated exposure control, adjustment of the mA and/or kV according to patient size and/or use  of iterative reconstruction technique. COMPARISON:  None Available. FINDINGS: CT HEAD FINDINGS Brain: Mild age-related atrophy and chronic microvascular ischemic changes. There is no acute intracranial hemorrhage. No mass effect or midline shift. No extra-axial fluid collection. Vascular: No hyperdense vessel or unexpected calcification. Skull: Normal. Negative for fracture or focal lesion. Other: Right periorbital hematoma. CT MAXILLOFACIAL FINDINGS Osseous: There are fractures of the anterior and posterolateral walls of the right maxillary sinus. Probable extension of the fracture into the right nasal maxillary junction.  No other acute fracture. No mandibular dislocation. Orbits: There is mild stranding of the right retro-orbital fat. The globes appear intact. Sinuses: Small air-fluid level in the right maxillary sinus, likely sinus. The remainder of the visualized paranasal sinuses and mastoid air cells are clear. Soft tissues: Right periorbital hematoma. CT CERVICAL SPINE FINDINGS Alignment: No acute subluxation. Skull base and vertebrae: No acute fracture.  Osteopenia. Soft tissues and spinal canal: No prevertebral fluid or swelling. No visible canal hematoma. Disc levels:  No acute findings.  Degenerative changes Upper chest: Emphysema. Other: None IMPRESSION: 1. No acute intracranial pathology. Mild age-related atrophy and chronic microvascular ischemic changes. 2. Fractures of the anterior and posterolateral walls of the right maxillary sinus. 3. No acute/traumatic cervical spine pathology. Electronically Signed   By: Anner Crete M.D.   On: 12/10/2021 23:02   CT Cervical Spine Wo Contrast  Result Date: 12/10/2021 CLINICAL DATA:  Trauma. EXAM: CT HEAD WITHOUT CONTRAST CT MAXILLOFACIAL WITHOUT CONTRAST CT CERVICAL SPINE WITHOUT CONTRAST TECHNIQUE: Multidetector CT imaging of the head, cervical spine, and maxillofacial structures were performed using the standard protocol without intravenous contrast.  Multiplanar CT image reconstructions of the cervical spine and maxillofacial structures were also generated. RADIATION DOSE REDUCTION: This exam was performed according to the departmental dose-optimization program which includes automated exposure control, adjustment of the mA and/or kV according to patient size and/or use of iterative reconstruction technique. COMPARISON:  None Available. FINDINGS: CT HEAD FINDINGS Brain: Mild age-related atrophy and chronic microvascular ischemic changes. There is no acute intracranial hemorrhage. No mass effect or midline shift. No extra-axial fluid collection. Vascular: No hyperdense vessel or unexpected calcification. Skull: Normal. Negative for fracture or focal lesion. Other: Right periorbital hematoma. CT MAXILLOFACIAL FINDINGS Osseous: There are fractures of the anterior and posterolateral walls of the right maxillary sinus. Probable extension of the fracture into the right nasal maxillary junction. No other acute fracture. No mandibular dislocation. Orbits: There is mild stranding of the right retro-orbital fat. The globes appear intact. Sinuses: Small air-fluid level in the right maxillary sinus, likely sinus. The remainder of the visualized paranasal sinuses and mastoid air cells are clear. Soft tissues: Right periorbital hematoma. CT CERVICAL SPINE FINDINGS Alignment: No acute subluxation. Skull base and vertebrae: No acute fracture.  Osteopenia. Soft tissues and spinal canal: No prevertebral fluid or swelling. No visible canal hematoma. Disc levels:  No acute findings.  Degenerative changes Upper chest: Emphysema. Other: None IMPRESSION: 1. No acute intracranial pathology. Mild age-related atrophy and chronic microvascular ischemic changes. 2. Fractures of the anterior and posterolateral walls of the right maxillary sinus. 3. No acute/traumatic cervical spine pathology. Electronically Signed   By: Anner Crete M.D.   On: 12/10/2021 23:02   MM 3D SCREEN BREAST  BILATERAL  Result Date: 12/10/2021 CLINICAL DATA:  Screening. EXAM: DIGITAL SCREENING BILATERAL MAMMOGRAM WITH TOMOSYNTHESIS AND CAD TECHNIQUE: Bilateral screening digital craniocaudal and mediolateral oblique mammograms were obtained. Bilateral screening digital breast tomosynthesis was performed. The images were evaluated with computer-aided detection. COMPARISON:  Previous exam(s). ACR Breast Density Category b: There are scattered areas of fibroglandular density. FINDINGS: There are no findings suspicious for malignancy. IMPRESSION: No mammographic evidence of malignancy. A result letter of this screening mammogram will be mailed directly to the patient. RECOMMENDATION: Screening mammogram in one year. (Code:SM-B-01Y) BI-RADS CATEGORY  1: Negative. Electronically Signed   By: Evangeline Dakin M.D.   On: 12/10/2021 14:18   DG Bone Density  Result Date: 12/09/2021 EXAM: DUAL X-RAY ABSORPTIOMETRY (DXA) FOR BONE  MINERAL DENSITY IMPRESSION: Your patient Talajah Slimp completed a BMD test on 12/09/2021 using the [place] (software version: 14.10) manufactured by [employer]. The following summarizes the results of our evaluation. Technologist:: TNB PATIENT BIOGRAPHICAL: Name: Zamari, Bonsall Patient ID: [id number] Birth Date: Feb 23, 1933 Height: 63.0 in. Gender: Female Exam Date: 12/09/2021 Weight: 142.0 lbs. Indications: Advanced Age, Caucasian, COPD, Height Loss, Hx of Tobacco Use, Hypothyroid, Postmenopausal Fractures: Treatments: CALCIUM VIT D, Multi-Vitamin with calcium DENSITOMETRY RESULTS: Site      Region     Measured Date Measured Age WHO Classification Young Adult T-score BMD         %Change vs. Previous Significant Change (*) AP Spine L1-L4 12/09/2023 87.2 Osteopenia -2.3 0.911 g/cm2 1.0% - AP Spine L1-L4 02/23/2016 81.4 Osteopenia -2.4 0.902 g/cm2 - - DualFemur Neck Left 12/09/2021 87.2 Osteopenia -1.9 0.770 g/cm2 -4.3% - DualFemur Neck Left 02/23/2016 81.4 Osteopenia -1.7 0.805  g/cm2 - - DualFemur Total Mean 12/09/2021 87.2 Osteopenia -1.3 0.844 g/cm2 1.1% - DualFemur Total Mean 02/23/2016 81.4 Osteopenia -1.4 0.835 g/cm2 - - ASSESSMENT: The BMD measured at AP Spine L1-L4 is 0.911 g/cm2 with a T-score of -2.3. This patient is considered osteopenic according to [facility]) criteria. Compared with prior study, there has been no significant change in the spine. Compared with prior study, there has been no significant change in the left hip. Compared with prior study, there has been no significant change in the total mean. Patient is not a candidate for FRAX due to Fosamax. The scan quality is good. World Pharmacologist [facility]) criteria for post-menopausal, Caucasian Women: Normal:                   T-score at or above -1 SD Osteopenia/low bone mass: T-score between -1 and -2.5 SD Osteoporosis:             T-score at or below -2.5 SD RECOMMENDATIONS: 1. All patients should optimize calcium and vitamin D intake. 2. Consider FDA-approved medical therapies in postmenopausal women and men aged 45 years and older, based on the following: a. A hip or vertebral(clinical or morphometric) fracture b. T-score < -2.5 at the femoral neck or spine after appropriate evaluation to exclude secondary causes c. Low bone mass (T-score between -1.0 and -2.5 at the femoral neck or spine) and a 10-year probability of a hip fracture > 3% or a 10-year probability of a major osteoporosis-related fracture > 20% based on the [place]-adapted WHO algorithm 3. Clinician judgment and/or patient preferences may indicate treatment for people with 10-year fracture probabilities above or below these levels FOLLOW-UP: People with diagnosed cases of osteoporosis or at high risk for fracture should have regular bone mineral density tests. For patients eligible for Medicare, routine testing is allowed once every 2 years. The testing frequency can be increased to one year for patients who have rapidly progressing  disease, those who are receiving or discontinuing medical therapy to restore bone mass, or have additional risk factors. I have reviewed this report, and agree with the above findings. [facility] Electronically Signed   By: Franki Cabot M.D.   On: 12/09/2021 14:49    Procedures Procedures   LACERATION REPAIR Performed by: [employer] Authorized by: Blanchie Dessert Consent: Verbal consent obtained. Risks and benefits: risks, benefits and alternatives were discussed Consent given by: patient Patient identity confirmed: provided demographic data Prepped and Draped in normal sterile fashion Wound explored  Laceration Location: right eyebrow  Laceration Length:  2cm  No Foreign Bodies seen or palpated  Anesthesia: none  Irrigation method: scrub Amount of cleaning: standard  Skin closure: dermabond   Patient tolerance: Patient tolerated the procedure well with no immediate complications.   Medications Ordered in ED Medications - No data to display  ED Course/ Medical Decision Making/ A&P                           Medical Decision Making Amount and/or Complexity of Data Reviewed Labs: ordered. Radiology: ordered.   Pt with multiple medical problems and comorbidities and presenting today with a complaint that caries a high risk for morbidity and mortality.  Elderly female presenting today after a fall at home with unknown story.  Patient does not remember the fall and concern for possible syncope versus a fall that caused her to hit her head and then caused a loss of consciousness.  Patient has no specific complaints except for some mild pain on her right eye where she has a hematoma and laceration.  She reports she just cannot remember what happened.  She did admit to the paramedics that she had been drinking some wine tonight.  She denies any chest pain or shortness of breath but it was noted that she had hypoxia when this paramedic unit arrived.  Patient  is satting 98% on 2 L.  She does have a history of COPD but does not wear oxygen chronically.  She does not take any anticoagulation.  Patient wound was repaired as above.  Concern for syncope versus electrolyte abnormality as she did see her doctor last week and at that time they did discuss hyponatremia.  She does have a history of heart failure but has been relatively stable with that.  However concern for that being the cause of syncope or dysrhythmia.  Also if patient had been drinking wine that may have been the result of her fall.  Will ensure no evidence of intracranial injury or facial fracture.  Head facial and cervical CTs are pending.  EKG and labs are pending.  11:29 PM I independently interpreted patient's labs and EKG today.  Patient's EKG without acute changes.  Labs are reassuring with a normal CBC, CMP.  Patient's alcohol level is elevated at 173.  I have independently visualized and interpreted pt's images today.  Head CT without any evidence of acute intracranial hemorrhage today.  Cervical spine without signs of fracture.  Facial to CT does appear to have a facial fracture.  Radiology reports a right isolated maxillary sinus fracture.  Spoke with Dr. Redmond Baseman with ENT and he reported patient does not need antibiotics and they typically will follow-up within 1 week for exam in the office.  Findings were discussed with the patient's daughter.  Suspect the cause of the patient's fall tonight was her alcohol use.  Once patient is sober enough to walk feel that she can be discharged home.          Final Clinical Impression(s) / ED Diagnoses Final diagnoses:  None    Rx / DC Orders ED Discharge Orders     None         Blanchie Dessert, MD 12/10/21 2330

## 2021-12-10 NOTE — ED Triage Notes (Signed)
Pt brought to ED by [facility] for evaluation of fall this evening. Details involving fall unknown, but pt endorses ETOH use this evening.  EMS states family was unable to provide medical hx. EMS states pt was 77% RA and 95% 4L O2 and hypertensive. Pt displays some confusion at triage.

## 2021-12-11 ENCOUNTER — Other Ambulatory Visit: Payer: Self-pay | Admitting: Internal Medicine

## 2021-12-11 DIAGNOSIS — S0240CA Maxillary fracture, right side, initial encounter for closed fracture: Secondary | ICD-10-CM

## 2021-12-11 MED ORDER — ACETAMINOPHEN 500 MG PO TABS
1000.0000 mg | ORAL_TABLET | Freq: Once | ORAL | Status: AC
  Administered 2021-12-11: 1000 mg via ORAL
  Filled 2021-12-11: qty 2

## 2021-12-11 NOTE — Discharge Instructions (Signed)
You were evaluated in the Emergency Department and after careful evaluation, we did not find any emergent condition requiring admission or further testing in the hospital.  Your exam/testing today was overall reassuring.  Important to follow up with ENT in 1 week.  Please return to the Emergency Department if you experience any worsening of your condition.  Thank you for allowing [place] to be a part of your care.

## 2021-12-11 NOTE — ED Provider Notes (Signed)
  Provider Note MRN:  [id number]  Arrival date & time: 12/11/21    ED Course and Medical Decision Making  Assumed care from Dr. Maryan Rued at shift change.  Intoxicated, fall, facial trauma maxillary sinus fracture discussed with ENT, will follow-up in the office.  Allowing some further metabolization and will ensure steady ambulation prior to departure.  1:40 AM update: Patient ambulating without issue, appropriate for discharge.  Procedures  Final Clinical Impressions(s) / ED Diagnoses     ICD-10-CM   1. Fall, initial encounter  W19.XXXA     2. Closed fracture of facial bone, unspecified facial bone, initial encounter ([place])  S02.92XA       ED Discharge Orders     None         Discharge Instructions      You were evaluated in the Emergency Department and after careful evaluation, we did not find any emergent condition requiring admission or further testing in the hospital.  Your exam/testing today was overall reassuring.  Important to follow up with ENT in 1 week.  Please return to the Emergency Department if you experience any worsening of your condition.  Thank you for allowing [place] to be a part of your care.     Barth Kirks. [place] Small, [facility] [email or web address]    Maudie Flakes, MD 12/11/21 [phone]

## 2021-12-11 NOTE — ED Notes (Signed)
Pt ambulated to BR with a steady gait, no assistance needed, no difficulty noted or reported, EDP aware

## 2021-12-17 DIAGNOSIS — S0240CA Maxillary fracture, right side, initial encounter for closed fracture: Secondary | ICD-10-CM | POA: Diagnosis not present

## 2022-01-20 ENCOUNTER — Other Ambulatory Visit: Payer: Self-pay | Admitting: Internal Medicine

## 2022-01-28 ENCOUNTER — Ambulatory Visit (INDEPENDENT_AMBULATORY_CARE_PROVIDER_SITE_OTHER): Payer: Medicare HMO | Admitting: Internal Medicine

## 2022-01-28 ENCOUNTER — Encounter: Payer: Self-pay | Admitting: Internal Medicine

## 2022-01-28 VITALS — BP 116/70 | HR 91 | Temp 98.2°F | Resp 14 | Ht 64.0 in | Wt 138.8 lb

## 2022-01-28 DIAGNOSIS — E039 Hypothyroidism, unspecified: Secondary | ICD-10-CM

## 2022-01-28 DIAGNOSIS — I152 Hypertension secondary to endocrine disorders: Secondary | ICD-10-CM

## 2022-01-28 DIAGNOSIS — F411 Generalized anxiety disorder: Secondary | ICD-10-CM

## 2022-01-28 DIAGNOSIS — Z79899 Other long term (current) drug therapy: Secondary | ICD-10-CM

## 2022-01-28 DIAGNOSIS — Z23 Encounter for immunization: Secondary | ICD-10-CM

## 2022-01-28 DIAGNOSIS — E1159 Type 2 diabetes mellitus with other circulatory complications: Secondary | ICD-10-CM

## 2022-01-28 DIAGNOSIS — E119 Type 2 diabetes mellitus without complications: Secondary | ICD-10-CM

## 2022-01-28 DIAGNOSIS — J441 Chronic obstructive pulmonary disease with (acute) exacerbation: Secondary | ICD-10-CM | POA: Diagnosis not present

## 2022-01-28 DIAGNOSIS — E1169 Type 2 diabetes mellitus with other specified complication: Secondary | ICD-10-CM | POA: Diagnosis not present

## 2022-01-28 DIAGNOSIS — E538 Deficiency of other specified B group vitamins: Secondary | ICD-10-CM | POA: Diagnosis not present

## 2022-01-28 DIAGNOSIS — F1010 Alcohol abuse, uncomplicated: Secondary | ICD-10-CM

## 2022-01-28 DIAGNOSIS — E785 Hyperlipidemia, unspecified: Secondary | ICD-10-CM

## 2022-01-28 DIAGNOSIS — Z9181 History of falling: Secondary | ICD-10-CM

## 2022-01-28 MED ORDER — PREDNISONE 10 MG PO TABS: ORAL_TABLET | ORAL | 0 refills | Status: DC

## 2022-01-28 MED ORDER — AZITHROMYCIN 500 MG PO TABS: 500.0000 mg | ORAL_TABLET | Freq: Every day | ORAL | 0 refills | Status: DC

## 2022-01-28 NOTE — Assessment & Plan Note (Signed)
Fall on Sept 1 occurred while inebriated.  Right maxilla fracture,  Nondisplaced.

## 2022-01-28 NOTE — Assessment & Plan Note (Signed)
Mild.  Prednisone and azithromycn prescribed.  Home COVID test was negative yesterday

## 2022-01-28 NOTE — Patient Instructions (Addendum)
Limit your alcohol to one glass of wine per night  You are having a COPD exacerbation .    I am treating you  with the following:  Prednisone tapering dose for the next 6 days   Azithromycin  once daily  For   7 days (antibiotic) Resume your maintenance nebulizer , budesonide   twice daily .  Use your albuterol inhaler as needed Take Delsym  for cough    Please take a probiotic ( Align, Floraque or Culturelle), or  the generic version of one of these  For a minimum of 3 weeks to prevent a serious antibiotic associated diarrhea  Called clostridium dificile colitis  .  The alternative is to eat a serving of yogurt daily wyogurt contains live cultures of probiotics)

## 2022-01-28 NOTE — Progress Notes (Unsigned)
Subjective:  Patient ID: Mary Howe, female    DOB: [birth year]  Age: 88 y.o. MRN: [id number]  CC: The primary encounter diagnosis was Need for immunization against influenza. Diagnoses of B12 deficiency, Long-term use of high-risk medication, Acquired hypothyroidism, Diabetes mellitus without complication ([place]), Hypertension associated with type 2 diabetes mellitus ([place]), Hyperlipidemia associated with type 2 diabetes mellitus ([place]), History of fall within past 90 days, and COPD exacerbation ([place]) were also pertinent to this visit.   HPI Mary Howe presents for  Chief Complaint  Patient presents with   Follow-up    6 mon, denies any concerns.     1) history of fall :  occurred Sept 1 at home,  taken to ER   ETOH level was HIGH ct O SPINE HEAD AND FACE DONE> Osseous: There are fractures of the anterior and posterolateral walls of the right maxillary sinus. Probable extension of the fracture into the right nasal maxillary junction Was referred to ENT by me for follow up AND WAS SEE BY dR bENNETET ON sEPT 8  .  NO FOLLOW UP NEEDED;  FARCTURE WAS NONDISPLACED   2) Alcohol use:  reviewed   she drinks 2 glasses of wine daily for hte past 30  years.  Admits that on the night of the fall she may have had more.    Not using alpraolam   3) cough of < 24 hours.  COVID TEST WAS NEGATIVE YESTERDAY.Marland Kitchen HAS NOT BEEN DILIGENT WITH USE OF NEBULIZER (budesonide)   Outpatient Medications Prior to Visit  Medication Sig Dispense Refill   acetaminophen (TYLENOL) 500 MG tablet Take 500 mg by mouth every 6 (six) hours as needed.     albuterol (VENTOLIN HFA) 108 (90 Base) MCG/ACT inhaler Inhale 1-2 puffs into the lungs every 4 (four) hours as needed for wheezing or shortness of breath. 1 each 10   atorvastatin (LIPITOR) 20 MG tablet TAKE 1 TABLET(20 MG) BY MOUTH EVERY OTHER DAY 45 tablet 3   bisoprolol (ZEBETA) 5 MG tablet TAKE 1/2 TABLET(2.5 MG) BY MOUTH DAILY 45 tablet 1   budesonide (PULMICORT) 0.5  MG/2ML nebulizer solution USE 1 VIAL VIA NEBULIZER TWICE DAILY 120 mL 11   calcium carbonate (OS-CAL) 600 MG TABS tablet Take 600 mg by mouth daily with breakfast.      cetirizine (ZYRTEC) 10 MG tablet Take 1 tablet (10 mg total) by mouth daily. 90 tablet 1   escitalopram (LEXAPRO) 10 MG tablet TAKE 1 TABLET(10 MG) BY MOUTH DAILY 90 tablet 1   furosemide (LASIX) 20 MG tablet TAKE 1 TABLET BY MOUTH DAILY WITH 1 EXTRA TABLET IN THE EVENING FOR SWELLING 180 tablet 3   levothyroxine (SYNTHROID) 50 MCG tablet TAKE 1 TABLET(50 MCG) BY MOUTH DAILY AT 6 AM 90 tablet 1   MULTIPLE VITAMIN PO Take 1 tablet by mouth daily.      OXYGEN Inhale 2.5 L daily into the lungs.     pantoprazole (PROTONIX) 40 MG tablet TAKE 1 TABLET(40 MG) BY MOUTH DAILY 90 tablet 1   ALPRAZolam (XANAX) 0.25 MG tablet Take 1 tablet (0.25 mg total) by mouth 2 (two) times daily as needed for anxiety. 20 tablet 0   valACYclovir (VALTREX) 1000 MG tablet Take 1 tablet (1,000 mg total) by mouth 2 (two) times daily. For one week, then reduce use to once daily (Patient not taking: Reported on 08/16/2021) 37 tablet 5   alendronate (FOSAMAX) 70 MG tablet TAKE 1 TABLET(70 MG) BY MOUTH EVERY 7 DAYS WITH A  FULL GLASS OF WATER AND ON AN EMPTY STOMACH (Patient not taking: Reported on 01/28/2022) 4 tablet 11   No facility-administered medications prior to visit.    Review of Systems;  Patient denies headache, fevers, malaise, unintentional weight loss, skin rash, eye pain, sinus congestion and sinus pain, sore throat, dysphagia,  hemoptysis , cough, dyspnea, wheezing, chest pain, palpitations, orthopnea, edema, abdominal pain, nausea, melena, diarrhea, constipation, flank pain, dysuria, hematuria, urinary  Frequency, nocturia, numbness, tingling, seizures,  Focal weakness, Loss of consciousness,  Tremor, insomnia, depression, anxiety, and suicidal ideation.      Objective:  BP 116/70 (BP Location: Left Arm, Patient Position: Sitting, Cuff Size:  Normal)   Pulse 91   Temp 98.2 F (36.8 C) (Oral)   Resp 14   Ht '5\' 4"'$  (1.626 m)   Wt 138 lb 12.8 oz (63 kg)   SpO2 95%   BMI 23.82 kg/m   BP Readings from Last 3 Encounters:  01/28/22 116/70  12/11/21 (!) 113/58  08/16/21 140/62    Wt Readings from Last 3 Encounters:  01/28/22 138 lb 12.8 oz (63 kg)  08/16/21 141 lb 6 oz (64.1 kg)  07/28/21 142 lb (64.4 kg)    General appearance: alert, cooperative and appears stated age Ears: normal TM's and external ear canals both ears Throat: lips, mucosa, and tongue normal; teeth and gums normal Neck: no adenopathy, no carotid bruit, supple, symmetrical, trachea midline and thyroid not enlarged, symmetric, no tenderness/mass/nodules Back: symmetric, no curvature. ROM normal. No CVA tenderness. Lungs: clear to auscultation bilaterally Heart: regular rate and rhythm, S1, S2 normal, no murmur, click, rub or gallop Abdomen: soft, non-tender; bowel sounds normal; no masses,  no organomegaly Pulses: 2+ and symmetric Skin: Skin color, texture, turgor normal. No rashes or lesions Lymph nodes: Cervical, supraclavicular, and axillary nodes normal. Neuro:  awake and interactive with normal mood and affect. Higher cortical functions are normal. Speech is clear without word-finding difficulty or dysarthria. Extraocular movements are intact. Visual fields of both eyes are grossly intact. Sensation to light touch is grossly intact bilaterally of upper and lower extremities. Motor examination shows 4+/5 symmetric hand grip and upper extremity and 5/5 lower extremity strength. There is no pronation or drift. Gait is non-ataxic   Lab Results  Component Value Date   HGBA1C 5.8 07/28/2021   HGBA1C 5.9 02/11/2021   HGBA1C 5.7 07/10/2020    Lab Results  Component Value Date   CREATININE 0.58 12/10/2021   CREATININE 0.60 07/28/2021   CREATININE 0.58 02/11/2021    Lab Results  Component Value Date   WBC 6.8 12/10/2021   HGB 13.7 12/10/2021   HCT  41.3 12/10/2021   PLT 210 12/10/2021   GLUCOSE 125 (H) 12/10/2021   CHOL 186 07/28/2021   TRIG 204.0 (H) 07/28/2021   HDL 98.40 07/28/2021   LDLDIRECT 70.0 07/28/2021   LDLCALC 79 02/11/2021   ALT 32 12/10/2021   AST 43 (H) 12/10/2021   NA 140 12/10/2021   K 4.2 12/10/2021   CL 106 12/10/2021   CREATININE 0.58 12/10/2021   BUN 11 12/10/2021   CO2 22 12/10/2021   TSH 0.98 07/28/2021   INR 1.09 01/16/2017   HGBA1C 5.8 07/28/2021   MICROALBUR <0.7 07/28/2021    CT Head Wo Contrast  Result Date: 12/10/2021 CLINICAL DATA:  Trauma. EXAM: CT HEAD WITHOUT CONTRAST CT MAXILLOFACIAL WITHOUT CONTRAST CT CERVICAL SPINE WITHOUT CONTRAST TECHNIQUE: Multidetector CT imaging of the head, cervical spine, and maxillofacial structures were performed using the  standard protocol without intravenous contrast. Multiplanar CT image reconstructions of the cervical spine and maxillofacial structures were also generated. RADIATION DOSE REDUCTION: This exam was performed according to the departmental dose-optimization program which includes automated exposure control, adjustment of the mA and/or kV according to patient size and/or use of iterative reconstruction technique. COMPARISON:  None Available. FINDINGS: CT HEAD FINDINGS Brain: Mild age-related atrophy and chronic microvascular ischemic changes. There is no acute intracranial hemorrhage. No mass effect or midline shift. No extra-axial fluid collection. Vascular: No hyperdense vessel or unexpected calcification. Skull: Normal. Negative for fracture or focal lesion. Other: Right periorbital hematoma. CT MAXILLOFACIAL FINDINGS Osseous: There are fractures of the anterior and posterolateral walls of the right maxillary sinus. Probable extension of the fracture into the right nasal maxillary junction. No other acute fracture. No mandibular dislocation. Orbits: There is mild stranding of the right retro-orbital fat. The globes appear intact. Sinuses: Small air-fluid  level in the right maxillary sinus, likely sinus. The remainder of the visualized paranasal sinuses and mastoid air cells are clear. Soft tissues: Right periorbital hematoma. CT CERVICAL SPINE FINDINGS Alignment: No acute subluxation. Skull base and vertebrae: No acute fracture.  Osteopenia. Soft tissues and spinal canal: No prevertebral fluid or swelling. No visible canal hematoma. Disc levels:  No acute findings.  Degenerative changes Upper chest: Emphysema. Other: None IMPRESSION: 1. No acute intracranial pathology. Mild age-related atrophy and chronic microvascular ischemic changes. 2. Fractures of the anterior and posterolateral walls of the right maxillary sinus. 3. No acute/traumatic cervical spine pathology. Electronically Signed   By: Anner Crete M.D.   On: 12/10/2021 23:02   CT Maxillofacial Wo Contrast  Result Date: 12/10/2021 CLINICAL DATA:  Trauma. EXAM: CT HEAD WITHOUT CONTRAST CT MAXILLOFACIAL WITHOUT CONTRAST CT CERVICAL SPINE WITHOUT CONTRAST TECHNIQUE: Multidetector CT imaging of the head, cervical spine, and maxillofacial structures were performed using the standard protocol without intravenous contrast. Multiplanar CT image reconstructions of the cervical spine and maxillofacial structures were also generated. RADIATION DOSE REDUCTION: This exam was performed according to the departmental dose-optimization program which includes automated exposure control, adjustment of the mA and/or kV according to patient size and/or use of iterative reconstruction technique. COMPARISON:  None Available. FINDINGS: CT HEAD FINDINGS Brain: Mild age-related atrophy and chronic microvascular ischemic changes. There is no acute intracranial hemorrhage. No mass effect or midline shift. No extra-axial fluid collection. Vascular: No hyperdense vessel or unexpected calcification. Skull: Normal. Negative for fracture or focal lesion. Other: Right periorbital hematoma. CT MAXILLOFACIAL FINDINGS Osseous: There are  fractures of the anterior and posterolateral walls of the right maxillary sinus. Probable extension of the fracture into the right nasal maxillary junction. No other acute fracture. No mandibular dislocation. Orbits: There is mild stranding of the right retro-orbital fat. The globes appear intact. Sinuses: Small air-fluid level in the right maxillary sinus, likely sinus. The remainder of the visualized paranasal sinuses and mastoid air cells are clear. Soft tissues: Right periorbital hematoma. CT CERVICAL SPINE FINDINGS Alignment: No acute subluxation. Skull base and vertebrae: No acute fracture.  Osteopenia. Soft tissues and spinal canal: No prevertebral fluid or swelling. No visible canal hematoma. Disc levels:  No acute findings.  Degenerative changes Upper chest: Emphysema. Other: None IMPRESSION: 1. No acute intracranial pathology. Mild age-related atrophy and chronic microvascular ischemic changes. 2. Fractures of the anterior and posterolateral walls of the right maxillary sinus. 3. No acute/traumatic cervical spine pathology. Electronically Signed   By: Anner Crete M.D.   On: 12/10/2021 23:02  CT Cervical Spine Wo Contrast  Result Date: 12/10/2021 CLINICAL DATA:  Trauma. EXAM: CT HEAD WITHOUT CONTRAST CT MAXILLOFACIAL WITHOUT CONTRAST CT CERVICAL SPINE WITHOUT CONTRAST TECHNIQUE: Multidetector CT imaging of the head, cervical spine, and maxillofacial structures were performed using the standard protocol without intravenous contrast. Multiplanar CT image reconstructions of the cervical spine and maxillofacial structures were also generated. RADIATION DOSE REDUCTION: This exam was performed according to the departmental dose-optimization program which includes automated exposure control, adjustment of the mA and/or kV according to patient size and/or use of iterative reconstruction technique. COMPARISON:  None Available. FINDINGS: CT HEAD FINDINGS Brain: Mild age-related atrophy and chronic  microvascular ischemic changes. There is no acute intracranial hemorrhage. No mass effect or midline shift. No extra-axial fluid collection. Vascular: No hyperdense vessel or unexpected calcification. Skull: Normal. Negative for fracture or focal lesion. Other: Right periorbital hematoma. CT MAXILLOFACIAL FINDINGS Osseous: There are fractures of the anterior and posterolateral walls of the right maxillary sinus. Probable extension of the fracture into the right nasal maxillary junction. No other acute fracture. No mandibular dislocation. Orbits: There is mild stranding of the right retro-orbital fat. The globes appear intact. Sinuses: Small air-fluid level in the right maxillary sinus, likely sinus. The remainder of the visualized paranasal sinuses and mastoid air cells are clear. Soft tissues: Right periorbital hematoma. CT CERVICAL SPINE FINDINGS Alignment: No acute subluxation. Skull base and vertebrae: No acute fracture.  Osteopenia. Soft tissues and spinal canal: No prevertebral fluid or swelling. No visible canal hematoma. Disc levels:  No acute findings.  Degenerative changes Upper chest: Emphysema. Other: None IMPRESSION: 1. No acute intracranial pathology. Mild age-related atrophy and chronic microvascular ischemic changes. 2. Fractures of the anterior and posterolateral walls of the right maxillary sinus. 3. No acute/traumatic cervical spine pathology. Electronically Signed   By: Anner Crete M.D.   On: 12/10/2021 23:02    Assessment & Plan:   Problem List Items Addressed This Visit     Acquired hypothyroidism   Relevant Orders   TSH   B12 deficiency   Relevant Orders   Vitamin B12   COPD exacerbation (HCC)    Mild.  Prednisone and azithromycn prescribed.  Home COVID test was negative yesterday      Relevant Medications   azithromycin (ZITHROMAX) 500 MG tablet   predniSONE (DELTASONE) 10 MG tablet   History of fall within past 90 days    Fall on Sept 1 occurred while inebriated.   Right maxilla fracture,  Nondisplaced.        Hyperlipidemia associated with type 2 diabetes mellitus ([place])   Relevant Orders   Lipid panel   Hypertension associated with type 2 diabetes mellitus ([place])   Relevant Orders   Comprehensive metabolic panel   Other Visit Diagnoses     Need for immunization against influenza    -  Primary   Relevant Orders   Flu Vaccine QUAD High Dose(Fluad) (Completed)   Long-term use of high-risk medication       Relevant Orders   CBC with Differential/Platelet   Diabetes mellitus without complication ([place])       Relevant Orders   Hemoglobin A1c       I spent a total of   minutes with this patient in a face to face visit on the date of this encounter reviewing the last office visit with me in       ,  most recent visit with cardiology ,    ,  patient's diet and exercise  habits, home blood pressure /blod sugar readings, recent ER visit including labs and imaging studies ,   and post visit ordering of testing and therapeutics.    Follow-up: No follow-ups on file.   [name], MD

## 2022-01-29 LAB — LIPID PANEL
Cholesterol: 204 mg/dL — ABNORMAL HIGH (ref <200)
HDL: 105 mg/dL (ref >=50)
LDL Cholesterol (Calc): 80 mg/dL (calc)
Non-HDL Cholesterol (Calc): 99 mg/dL (calc) (ref <130)
Total CHOL/HDL Ratio: 1.9 (calc) (ref <5.0)
Triglycerides: 103 mg/dL (ref <150)

## 2022-01-29 LAB — COMPREHENSIVE METABOLIC PANEL
AG Ratio: 1.7 (calc) (ref 1.0–2.5)
ALT: 17 U/L (ref 6–29)
AST: 24 U/L (ref 10–35)
Albumin: 4.3 g/dL (ref 3.6–5.1)
Alkaline phosphatase (APISO): 88 U/L (ref 37–153)
BUN: 10 mg/dL (ref 7–25)
CO2: 29 mmol/L (ref 20–32)
Calcium: 9.5 mg/dL (ref 8.6–10.4)
Chloride: 98 mmol/L (ref 98–110)
Creat: 0.6 mg/dL (ref 0.60–0.95)
Globulin: 2.6 g/dL (calc) (ref 1.9–3.7)
Glucose, Bld: 106 mg/dL — ABNORMAL HIGH (ref 65–99)
Potassium: 3.8 mmol/L (ref 3.5–5.3)
Sodium: 137 mmol/L (ref 135–146)
Total Bilirubin: 0.6 mg/dL (ref 0.2–1.2)
Total Protein: 6.9 g/dL (ref 6.1–8.1)

## 2022-01-29 LAB — CBC WITH DIFFERENTIAL/PLATELET
Absolute Monocytes: 680 cells/uL (ref 200–950)
Basophils Absolute: 48 cells/uL (ref 0–200)
Basophils Relative: 0.6 %
Eosinophils Absolute: 72 cells/uL (ref 15–500)
Eosinophils Relative: 0.9 %
HCT: 39.5 % (ref 35.0–45.0)
Hemoglobin: 13.5 g/dL (ref 11.7–15.5)
Lymphs Abs: 1512 cells/uL (ref 850–3900)
MCH: 32.8 pg (ref 27.0–33.0)
MCHC: 34.2 g/dL (ref 32.0–36.0)
MCV: 96.1 fL (ref 80.0–100.0)
MPV: 10.6 fL (ref 7.5–12.5)
Monocytes Relative: 8.5 %
Neutro Abs: 5688 cells/uL (ref 1500–7800)
Neutrophils Relative %: 71.1 %
Platelets: 213 10*3/uL (ref 140–400)
RBC: 4.11 10*6/uL (ref 3.80–5.10)
RDW: 11.8 % (ref 11.0–15.0)
Total Lymphocyte: 18.9 %
WBC: 8 10*3/uL (ref 3.8–10.8)

## 2022-01-29 LAB — HEMOGLOBIN A1C
Hgb A1c MFr Bld: 5.8 % of total Hgb — ABNORMAL HIGH (ref <5.7)
Mean Plasma Glucose: 120 mg/dL
eAG (mmol/L): 6.6 mmol/L

## 2022-01-29 LAB — TSH: TSH: 3.39 mIU/L (ref 0.40–4.50)

## 2022-01-29 LAB — VITAMIN B12: Vitamin B-12: 427 pg/mL (ref 200–1100)

## 2022-01-30 DIAGNOSIS — F1010 Alcohol abuse, uncomplicated: Secondary | ICD-10-CM | POA: Insufficient documentation

## 2022-01-30 NOTE — Assessment & Plan Note (Signed)
Historically well-controlled on diet alone .  hemoglobin A1c has been consistently at or  less than 7.0 . Patient is up-to-date on eye exams and foot exam is normal today. Patient has no microalbuminuria. Patient is intolerant of statin therapy  secondary to statin myalgia with previous trial of simvastatin; however her HDL:LDL  ratio is 1.   Lab Results  Component Value Date   HGBA1C 5.8 (H) 01/28/2022    Lab Results  Component Value Date   MICROALBUR <0.7 07/28/2021   MICROALBUR <0.7 07/10/2020     Lab Results  Component Value Date   CHOL 204 (H) 01/28/2022   HDL 105 01/28/2022   LDLCALC 80 01/28/2022   LDLDIRECT 70.0 07/28/2021   TRIG 103 01/28/2022   CHOLHDL 1.9 01/28/2022

## 2022-01-30 NOTE — Assessment & Plan Note (Signed)
Aggravated by health issues and overbearing sister. Improved with Lexapro at 5 mg daily and prn xanax. . The risks and benefits of benzodiazepine use were reviewedd with patient today including excessive sedation leading to respiratory depression,  impaired thinking/driving, and addiction.  Patient was advised to avoid concurrent use with alcohol, to use medication only as needed and not to share with others  .

## 2022-01-30 NOTE — Assessment & Plan Note (Signed)
She has reduced her intake since her fall which was attributed to loss of balance due to inebriation .

## 2022-02-25 ENCOUNTER — Other Ambulatory Visit: Payer: Self-pay | Admitting: Internal Medicine

## 2022-03-22 NOTE — Telephone Encounter (Signed)
Error

## 2022-04-01 ENCOUNTER — Other Ambulatory Visit: Payer: Self-pay | Admitting: Internal Medicine

## 2022-04-05 ENCOUNTER — Telehealth (INDEPENDENT_AMBULATORY_CARE_PROVIDER_SITE_OTHER): Payer: Medicare HMO | Admitting: Family Medicine

## 2022-04-05 ENCOUNTER — Encounter: Payer: Self-pay | Admitting: Family Medicine

## 2022-04-05 ENCOUNTER — Encounter: Payer: Self-pay | Admitting: Internal Medicine

## 2022-04-05 VITALS — Ht 64.0 in | Wt 130.0 lb

## 2022-04-05 DIAGNOSIS — J441 Chronic obstructive pulmonary disease with (acute) exacerbation: Secondary | ICD-10-CM

## 2022-04-05 MED ORDER — AZITHROMYCIN 250 MG PO TABS: ORAL_TABLET | ORAL | 0 refills | Status: AC

## 2022-04-05 MED ORDER — ALBUTEROL SULFATE HFA 108 (90 BASE) MCG/ACT IN AERS: 1.0000 | INHALATION_SPRAY | RESPIRATORY_TRACT | 10 refills | Status: DC | PRN

## 2022-04-05 MED ORDER — PREDNISONE 10 MG PO TABS: ORAL_TABLET | ORAL | 0 refills | Status: DC

## 2022-04-05 NOTE — Patient Instructions (Signed)
It was a pleasure meeting you today. Thank you for allowing me to take part in your health care.  Our goals for today as we discussed include:  Start Zithromax 500 mg on day 1 and then 250 mg daily for 4 days Start Prednisone pack and use as directed for 5 days  Follow up with PCP if no improvement  If worsening symptoms , increased shortness of breath, fevers    If you have any questions or concerns, please do not hesitate to call the office at [phone].  I look forward to our next visit and until then take care and stay safe.  Regards,   Carollee Leitz, MD   [facility]

## 2022-04-05 NOTE — Assessment & Plan Note (Addendum)
Mild symptoms.  Start Antibiotics and Steroid pack taper for 5 days Continue to maintain hydration Follow up with PCP if no improvement Strict return precautions provided

## 2022-04-05 NOTE — Telephone Encounter (Signed)
Spoke with pt's daughter and scheduled pt for a video visit at 11 am with Dr. Volanda Napoleon today.

## 2022-04-05 NOTE — Progress Notes (Signed)
Virtual Visit via Video note  I connected with Mary Howe on 04/17/22 at 1125 by video and verified that I am speaking with the correct person using two identifiers. Mary Howe is currently located at home and  is currently alone during visit. The provider, Carollee Leitz, MD is located in their office at time of visit.  I discussed the limitations, risks, security and privacy concerns of performing an evaluation and management service by video and the availability of in person appointments. I also discussed with the patient that there may be a patient responsible charge related to this service. The patient expressed understanding and agreed to proceed.  Subjective: PCP: [name], MD  Chief Complaint  Patient presents with   Acute Visit    Cough, little bit up head and chest congestion, mucus is clear, using delsym. No wheezing.     HPI Patient reports symptoms of cough for about 1 week.  Has had increased in clear sputum and worsening shortness of breath.  Has been using her breathing machine twice daily for increased wheezing.  Denies any fevers, chest pain, pain on inspiration, muscle aches, nausea/vomiting..  Endorses runny nose and nasal congestion.  Daughter had similar symptoms and was started on antibiotics 7 days ago and appears to be doing well.  Has not had COVID test.  History of COPD.  COVID-vaccine up-to-date Annual flu vaccine up-to-date  ROS: Per HPI  Current Outpatient Medications:    acetaminophen (TYLENOL) 500 MG tablet, Take 500 mg by mouth every 6 (six) hours as needed., Disp: , Rfl:    atorvastatin (LIPITOR) 20 MG tablet, TAKE 1 TABLET(20 MG) BY MOUTH EVERY OTHER DAY, Disp: 45 tablet, Rfl: 3   bisoprolol (ZEBETA) 5 MG tablet, TAKE 1/2 TABLET(2.5 MG) BY MOUTH DAILY, Disp: 45 tablet, Rfl: 1   budesonide (PULMICORT) 0.5 MG/2ML nebulizer solution, USE 1 VIAL VIA NEBULIZER TWICE DAILY, Disp: 120 mL, Rfl: 11   calcium carbonate (OS-CAL) 600 MG TABS tablet,  Take 600 mg by mouth daily with breakfast. , Disp: , Rfl:    cetirizine (ZYRTEC) 10 MG tablet, Take 1 tablet (10 mg total) by mouth daily., Disp: 90 tablet, Rfl: 1   escitalopram (LEXAPRO) 10 MG tablet, TAKE 1 TABLET(10 MG) BY MOUTH DAILY, Disp: 90 tablet, Rfl: 1   furosemide (LASIX) 20 MG tablet, TAKE 1 TABLET BY MOUTH DAILY WITH 1 EXTRA TABLET IN THE EVENING FOR SWELLING, Disp: 180 tablet, Rfl: 3   levothyroxine (SYNTHROID) 50 MCG tablet, TAKE 1 TABLET(50 MCG) BY MOUTH DAILY AT 6 AM, Disp: 90 tablet, Rfl: 1   MULTIPLE VITAMIN PO, Take 1 tablet by mouth daily. , Disp: , Rfl:    OXYGEN, Inhale 2.5 L daily into the lungs., Disp: , Rfl:    pantoprazole (PROTONIX) 40 MG tablet, TAKE 1 TABLET(40 MG) BY MOUTH DAILY, Disp: 90 tablet, Rfl: 1   valACYclovir (VALTREX) 1000 MG tablet, Take 1 tablet (1,000 mg total) by mouth 2 (two) times daily. For one week, then reduce use to once daily, Disp: 37 tablet, Rfl: 5   albuterol (VENTOLIN HFA) 108 (90 Base) MCG/ACT inhaler, Inhale 1-2 puffs into the lungs every 4 (four) hours as needed for wheezing or shortness of breath., Disp: 1 each, Rfl: 10   predniSONE (DELTASONE) 10 MG tablet, 6 tablets on Day 1 , then reduce by 1 tablet daily until gone, Disp: 21 tablet, Rfl: 0  Observations/Objective: Physical Exam Constitutional:      General: She is not in acute  distress.    Appearance: She is not ill-appearing, toxic-appearing or diaphoretic.  Pulmonary:     Effort: Pulmonary effort is normal.     Breath sounds: No wheezing.  Neurological:     Mental Status: She is alert and oriented to person, place, and time. Mental status is at baseline.  Psychiatric:        Mood and Affect: Mood normal.        Behavior: Behavior normal.        Thought Content: Thought content normal.        Judgment: Judgment normal.    Assessment and Plan: COPD exacerbation ([place]) Assessment & Plan: Mild symptoms.  Start Antibiotics and Steroid pack taper for 5 days Continue to  maintain hydration Follow up with PCP if no improvement Strict return precautions provided  Orders: -     Albuterol Sulfate HFA; Inhale 1-2 puffs into the lungs every 4 (four) hours as needed for wheezing or shortness of breath.  Dispense: 1 each; Refill: 10 -     Azithromycin; Take 2 tablets on day 1, then 1 tablet daily on days 2 through 5  Dispense: 6 tablet; Refill: 0 -     predniSONE; 6 tablets on Day 1 , then reduce by 1 tablet daily until gone  Dispense: 21 tablet; Refill: 0    Follow Up Instructions: Return if symptoms worsen or fail to improve, for PCP.   I discussed the assessment and treatment plan with the patient. The patient was provided an opportunity to ask questions and all were answered. The patient agreed with the plan and demonstrated an understanding of the instructions.   The patient was advised to call back or seek an in-person evaluation if the symptoms worsen or if the condition fails to improve as anticipated.  The above assessment and management plan was discussed with the patient. The patient verbalized understanding of and has agreed to the management plan. Patient is aware to call the clinic if symptoms persist or worsen. Patient is aware when to return to the clinic for a follow-up visit. Patient educated on when it is appropriate to go to the emergency department.   PDMP reviewed  Carollee Leitz, MD

## 2022-04-17 ENCOUNTER — Encounter: Payer: Self-pay | Admitting: Family Medicine

## 2022-05-04 ENCOUNTER — Telehealth: Payer: Self-pay | Admitting: Internal Medicine

## 2022-05-04 MED ORDER — BISOPROLOL FUMARATE 5 MG PO TABS: 2.5000 mg | ORAL_TABLET | Freq: Every day | ORAL | 1 refills | Status: DC

## 2022-05-04 NOTE — Telephone Encounter (Signed)
Medication has been refilled.

## 2022-05-04 NOTE — Telephone Encounter (Signed)
Pt need a refill on bisoprolol sent to [facility]

## 2022-06-03 ENCOUNTER — Telehealth: Payer: Self-pay | Admitting: Internal Medicine

## 2022-06-03 NOTE — Telephone Encounter (Signed)
Contacted Mary Howe to schedule their annual wellness visit. Patient declined to schedule AWV at this time.  Patient said she'll talk to her daughter and then schedule AWV.  Thank you,  [place] [facility]  [phone]

## 2022-06-21 ENCOUNTER — Telehealth: Payer: Medicare HMO | Admitting: Family

## 2022-08-21 ENCOUNTER — Other Ambulatory Visit: Payer: Self-pay | Admitting: Cardiovascular Disease

## 2022-08-22 ENCOUNTER — Other Ambulatory Visit: Payer: Self-pay

## 2022-08-22 MED ORDER — ATORVASTATIN CALCIUM 20 MG PO TABS: ORAL_TABLET | ORAL | 3 refills | Status: DC

## 2022-08-22 MED ORDER — LEVOTHYROXINE SODIUM 50 MCG PO TABS: ORAL_TABLET | ORAL | 1 refills | Status: DC

## 2022-08-22 NOTE — Telephone Encounter (Signed)
last visit 08/16/2021--f/u in 12 months  f/u not scheduled

## 2022-08-22 NOTE — Telephone Encounter (Signed)
Please call patient to schedule f/u for 90 day supply refill.  Thank you!

## 2022-08-22 NOTE — Telephone Encounter (Signed)
Pt scheduled on 5/29

## 2022-09-07 ENCOUNTER — Ambulatory Visit: Payer: Medicare HMO | Admitting: Physician Assistant

## 2022-11-03 DIAGNOSIS — D2271 Melanocytic nevi of right lower limb, including hip: Secondary | ICD-10-CM | POA: Diagnosis not present

## 2022-11-03 DIAGNOSIS — X32XXXA Exposure to sunlight, initial encounter: Secondary | ICD-10-CM | POA: Diagnosis not present

## 2022-11-03 DIAGNOSIS — L570 Actinic keratosis: Secondary | ICD-10-CM | POA: Diagnosis not present

## 2022-11-03 DIAGNOSIS — D2261 Melanocytic nevi of right upper limb, including shoulder: Secondary | ICD-10-CM | POA: Diagnosis not present

## 2022-11-03 DIAGNOSIS — L538 Other specified erythematous conditions: Secondary | ICD-10-CM | POA: Diagnosis not present

## 2022-11-03 DIAGNOSIS — D225 Melanocytic nevi of trunk: Secondary | ICD-10-CM | POA: Diagnosis not present

## 2022-11-03 DIAGNOSIS — L820 Inflamed seborrheic keratosis: Secondary | ICD-10-CM | POA: Diagnosis not present

## 2022-11-03 DIAGNOSIS — D2262 Melanocytic nevi of left upper limb, including shoulder: Secondary | ICD-10-CM | POA: Diagnosis not present

## 2022-11-20 ENCOUNTER — Other Ambulatory Visit: Payer: Self-pay | Admitting: Internal Medicine

## 2022-11-29 ENCOUNTER — Other Ambulatory Visit: Payer: Self-pay | Admitting: Internal Medicine

## 2022-12-01 ENCOUNTER — Telehealth: Payer: Self-pay | Admitting: Cardiovascular Disease

## 2022-12-01 ENCOUNTER — Other Ambulatory Visit: Payer: Self-pay | Admitting: Cardiovascular Disease

## 2022-12-01 NOTE — Telephone Encounter (Signed)
Left voice mail to schedule appt

## 2022-12-01 NOTE — Telephone Encounter (Signed)
last seen by Dr. Mariah Milling on 08/16/21 with plan to f/u in 12 months.  Please schedule f/u appt.  Thanks

## 2022-12-01 NOTE — Telephone Encounter (Signed)
Left voicemail for patient to schedule overdue appt from recall.

## 2022-12-02 ENCOUNTER — Telehealth: Payer: Self-pay | Admitting: Cardiovascular Disease

## 2022-12-02 NOTE — Telephone Encounter (Signed)
 Left voicemail to schedule appt from recall.

## 2022-12-05 ENCOUNTER — Ambulatory Visit (INDEPENDENT_AMBULATORY_CARE_PROVIDER_SITE_OTHER): Payer: Medicare HMO | Admitting: *Deleted

## 2022-12-05 ENCOUNTER — Telehealth: Payer: Self-pay | Admitting: Cardiovascular Disease

## 2022-12-05 VITALS — BP 136/74 | HR 87 | Temp 97.1°F | Ht 64.0 in | Wt 137.0 lb

## 2022-12-05 DIAGNOSIS — Z0000 Encounter for general adult medical examination without abnormal findings: Secondary | ICD-10-CM

## 2022-12-05 NOTE — Telephone Encounter (Signed)
Left voicemail on daughter's cell to schedule appt

## 2022-12-05 NOTE — Progress Notes (Signed)
Subjective:   Mary Howe is a 89 y.o. female who presents for Medicare Annual (Subsequent) preventive examination.  Visit Complete: In person Vital signs documented   Review of Systems     Cardiac Risk Factors include: advanced age (>64men, >6 women);dyslipidemia;hypertension     Objective:    Today's Vitals   12/05/22 1505 12/05/22 1546  BP: 136/74 136/74  Pulse: 87 87  Temp: (!) 97.1 F (36.2 C) (!) 97.1 F (36.2 C)  TempSrc:  Skin  SpO2: 94% 94%  Weight: 137 lb (62.1 kg) 137 lb (62.1 kg)  Height: 5\' 4"  (1.626 m) 5\' 4"  (1.626 m)   Body mass index is 23.52 kg/m.     12/05/2022    3:27 PM 06/11/2021   11:27 AM 06/10/2020   11:07 AM 06/10/2019    1:47 PM 06/07/2018    9:28 AM 11/15/2017    7:53 AM 10/10/2017   10:06 AM  Advanced Directives  Does Patient Have a Medical Advance Directive? Yes Yes Yes No Yes Yes Yes  Type of Estate agent of [place];Living will Healthcare Power of [employer] of [place];Living will  Living will;Healthcare Power of [employer] Power of [place];Living will Healthcare Power of Attorney  Does patient want to make changes to medical advance directive?  No - Patient declined No - Patient declined  No - Patient declined No - Patient declined No - Patient declined  Copy of Healthcare Power of Attorney in Chart? No - copy requested No - copy requested No - copy requested  No - copy requested No - copy requested Yes  Would patient like information on creating a medical advance directive?    No - Patient declined       Current Medications (verified) Outpatient Encounter Medications as of 12/05/2022  Medication Sig   acetaminophen (TYLENOL) 500 MG tablet Take 500 mg by mouth every 6 (six) hours as needed.   albuterol (VENTOLIN HFA) 108 (90 Base) MCG/ACT inhaler Inhale 1-2 puffs into the lungs every 4 (four) hours as needed for wheezing or shortness of breath.   atorvastatin (LIPITOR) 20 MG tablet TAKE 1  TABLET(20 MG) BY MOUTH EVERY OTHER DAY   bisoprolol (ZEBETA) 5 MG tablet TAKE 1/2 TABLET(2.5 MG) BY MOUTH DAILY   budesonide (PULMICORT) 0.5 MG/2ML nebulizer solution USE 1 VIAL VIA NEBULIZER TWICE DAILY   calcium carbonate (OS-CAL) 600 MG TABS tablet Take 600 mg by mouth daily with breakfast.    cetirizine (ZYRTEC) 10 MG tablet Take 1 tablet (10 mg total) by mouth daily.   escitalopram (LEXAPRO) 10 MG tablet TAKE 1 TABLET(10 MG) BY MOUTH DAILY   furosemide (LASIX) 20 MG tablet TAKE 1 TABLET BY MOUTH EVERY DAY. TAKE 1 EXTRA TABLET EVERY EVENING FOR SWELLING/PLEASE CALL OFFICE TO SCHEDULE APPOINTMENT PRIOR TO NEXT REFILL (first attempt)   levothyroxine (SYNTHROID) 50 MCG tablet TAKE 1 TABLET(50 MCG) BY MOUTH DAILY AT 6 AM   MULTIPLE VITAMIN PO Take 1 tablet by mouth daily.    OXYGEN Inhale 2.5 L daily into the lungs.   pantoprazole (PROTONIX) 40 MG tablet TAKE 1 TABLET(40 MG) BY MOUTH DAILY   [DISCONTINUED] predniSONE (DELTASONE) 10 MG tablet 6 tablets on Day 1 , then reduce by 1 tablet daily until gone (Patient not taking: Reported on 12/05/2022)   [DISCONTINUED] valACYclovir (VALTREX) 1000 MG tablet Take 1 tablet (1,000 mg total) by mouth 2 (two) times daily. For one week, then reduce use to once daily (Patient not taking: Reported on  12/05/2022)   No facility-administered encounter medications on file as of 12/05/2022.    Allergies (verified) Simvastatin   History: Past Medical History:  Diagnosis Date   CHF (congestive heart failure) (HCC)    COPD (chronic obstructive pulmonary disease) (HCC)    emphysema   Dental bridge present    permanent, lower right   Diverticulitis 2015   Essential hypertension    GERD (gastroesophageal reflux disease)    Hyperlipidemia    PAH (pulmonary artery hypertension) (HCC)    a. 01/2017 Echo: PASP ; b. 01/2017 RHC: mild PAH.   Shingles outbreak 01/30/2016   Takotsubo cardiomyopathy    a. 01/2017 Echo: EF 25-30%, sev glob HK w/ basal regions  moving well. Gr1 DD. mild MR, mildly dil LA, nl RV fxn, mild to mod TR, PASP ; b. 01/2017 Cath: Nl cors w/ EF 35-45%. HK of distal segments suggestive of Takotsubo CM.    Thyroid disease    Wears hearing aid in both ears    Past Surgical History:  Procedure Laterality Date   CATARACT EXTRACTION W/PHACO Right 10/10/2017   Procedure: CATARACT EXTRACTION PHACO AND INTRAOCULAR LENS PLACEMENT (IOC)  RIGHT;  Surgeon: Lockie Mola, MD;  Location: [facility];  Service: Ophthalmology;  Laterality: Right;   CATARACT EXTRACTION W/PHACO Left 11/15/2017   Procedure: CATARACT EXTRACTION PHACO AND INTRAOCULAR LENS PLACEMENT (IOC)  LEFT;  Surgeon: Lockie Mola, MD;  Location: [facility];  Service: Ophthalmology;  Laterality: Left;   CERVICAL CONE BIOPSY     CHOLECYSTECTOMY     HEMORROIDECTOMY     RIGHT/LEFT HEART CATH AND CORONARY ANGIOGRAPHY N/A 01/16/2017   Procedure: RIGHT/LEFT HEART CATH AND CORONARY ANGIOGRAPHY;  Surgeon: Iran Ouch, MD;  Location: [facility];  Service: Cardiovascular;  Laterality: N/A;   Family History  Problem Relation Age of Onset   Prostate cancer Father    Pneumonia Brother    Lung disease Brother    CAD Brother    Heart disease Brother    CAD Brother    Heart attack Brother    Brain cancer Brother    Bone cancer Sister    Emphysema Sister    Fibrocystic breast disease Sister    Aneurysm Sister    Heart disease Sister    Breast cancer Neg Hx    Social History   Socioeconomic History   Marital status: Divorced    Spouse name: Not on file   Number of children: Not on file   Years of education: Not on file   Highest education level: Not on file  Occupational History   Occupation: retired  Tobacco Use   Smoking status: Former    Current packs/day: 0.00    Average packs/day: 1.5 packs/day for 29.0 years (43.5 ttl pk-yrs)    Types: Cigarettes    Start date: 04/12/1959    Quit date: 04/11/1988    Years since  quitting: 34.6   Smokeless tobacco: Never   Tobacco comments:    quit smoking in 1990  Vaping Use   Vaping status: Never Used  Substance and Sexual Activity   Alcohol use: Yes    Alcohol/week: 7.0 standard drinks of alcohol    Types: 7 Glasses of wine per week    Comment:     Drug use: No   Sexual activity: Never  Other Topics Concern   Not on file  Social History Narrative   Moved from [place] to [place] in 2014, where she lives with her 4 y/o  sister.  Dtr and family nearby.       Social Determinants of Health   Financial Resource Strain: Low Risk  (12/05/2022)   Overall Financial Resource Strain (CARDIA)    Difficulty of Paying Living Expenses: Not hard at all  Food Insecurity: No Food Insecurity (12/05/2022)   Hunger Vital Sign    Worried About Running Out of Food in the Last Year: Never true    Ran Out of Food in the Last Year: Never true  Transportation Needs: No Transportation Needs (12/05/2022)   PRAPARE - Administrator, Civil Service (Medical): No    Lack of Transportation (Non-Medical): No  Physical Activity: Inactive (12/05/2022)   Exercise Vital Sign    Days of Exercise per Week: 0 days    Minutes of Exercise per Session: 0 min  Stress: No Stress Concern Present (12/05/2022)   [employer] of Occupational Health - Occupational Stress Questionnaire    Feeling of Stress : Only a little  Social Connections: Moderately Integrated (12/05/2022)   Social Connection and Isolation Panel [NHANES]    Frequency of Communication with Friends and Family: More than three times a week    Frequency of Social Gatherings with Friends and Family: More than three times a week    Attends Religious Services: More than 4 times per year    Active Member of [employer] or Organizations: Yes    Attends Banker Meetings: Never    Marital Status: Divorced    Tobacco Counseling Counseling given: Not Answered Tobacco comments: quit smoking in 1990   Clinical  Intake:  Pre-visit preparation completed: Yes  Pain : No/denies pain     BMI - recorded: 23.52 Nutritional Status: BMI of 19-24  Normal Nutritional Risks: None Diabetes: No  How often do you need to have someone help you when you read instructions, pamphlets, or other written materials from your doctor or pharmacy?: 1 - Never  Interpreter Needed?: No  Information entered by :: R. Meshell Abdulaziz LPN   Activities of Daily Living    12/05/2022    3:12 PM  In your present state of health, do you have any difficulty performing the following activities:  Hearing? 1  Comment wears aids  Vision? 0  Comment glasses  Difficulty concentrating or making decisions? 0  Walking or climbing stairs? 0  Dressing or bathing? 0  Doing errands, shopping? 0  Preparing Food and eating ? N  Using the Toilet? N  In the past six months, have you accidently leaked urine? N  Do you have problems with loss of bowel control? N  Managing your Medications? N  Managing your Finances? N  Housekeeping or managing your Housekeeping? N    Patient Care Team: Sherlene Shams, MD as PCP - General (Internal Medicine)  Indicate any recent Medical Services you may have received from other than [facility] providers in the past year (date may be approximate).     Assessment:   This is a routine wellness examination for [place].  Hearing/Vision screen Hearing Screening - Comments:: Wears aids Vision Screening - Comments:: glasses  Dietary issues and exercise activities discussed:     Goals Addressed             This Visit's Progress    Patient Stated       Exercise and walk more       Depression Screen    12/05/2022    3:21 PM 04/05/2022   11:20 AM 01/28/2022  2:53 PM 07/28/2021    3:17 PM 06/11/2021   11:31 AM 01/13/2021    9:33 AM 07/30/2020   10:18 AM  PHQ 2/9 Scores  PHQ - 2 Score 0 0 0 0 0 0 0  PHQ- 9 Score 0      0    Fall Risk    12/05/2022    3:14 PM 04/05/2022   11:20 AM 01/28/2022     2:53 PM 07/28/2021    3:17 PM 06/11/2021   11:31 AM  Fall Risk   Falls in the past year? 0 1 1 1  0  Number falls in past yr: 0 0 1 0 0  Injury with Fall? 0 1 0 0   Risk for fall due to : No Fall Risks History of fall(s) History of fall(s) History of fall(s)   Follow up Falls evaluation completed;Falls prevention discussed Falls evaluation completed Falls evaluation completed Falls evaluation completed Falls evaluation completed    MEDICARE RISK AT HOME: Medicare Risk at Home Any stairs in or around the home?: Yes If so, are there any without handrails?: No Home free of loose throw rugs in walkways, pet beds, electrical cords, etc?: Yes Adequate lighting in your home to reduce risk of falls?: Yes Life alert?: No Use of a cane, walker or w/c?: No Grab bars in the bathroom?: Yes Shower chair or bench in shower?: Yes Elevated toilet seat or a handicapped toilet?: No  TIMED UP AND GO:  Was the test performed?  Yes  Length of time to ambulate 10 feet: 8 sec Gait steady and fast without use of assistive device    Cognitive Function:    09/20/2016   11:42 AM  MMSE - Mini Mental State Exam  Orientation to time 5  Orientation to Place 5  Registration 3  Attention/ Calculation 5  Recall 3  Language- name 2 objects 2  Language- repeat 1  Language- follow 3 step command 3  Language- read & follow direction 1  Write a sentence 1  Copy design 1  Total score 30        12/05/2022    3:28 PM 06/11/2021   11:33 AM 06/10/2020   11:28 AM 06/10/2019    1:53 PM 06/07/2018    9:34 AM  6CIT Screen  What Year? 0 points 0 points 0 points 0 points 0 points  What month? 0 points 0 points 0 points 0 points 0 points  What time? 0 points 0 points 0 points 0 points 0 points  Count back from 20 0 points   0 points 0 points  Months in reverse  0 points 0 points 0 points 0 points  Repeat phrase 0 points   0 points 0 points  Total Score    0 points 0 points    Immunizations Immunization History   Administered Date(s) Administered   Fluad Quad(high Dose 65+) 01/24/2020, 01/13/2021, 01/28/2022   Influenza, High Dose Seasonal PF 02/23/2015, 01/18/2016, 12/14/2016, 01/18/2018   Influenza-Unspecified 01/23/2019   PFIZER(Purple Top)SARS-COV-2 Vaccination 05/03/2019, 05/24/2019, 01/13/2020   PNEUMOCOCCAL CONJUGATE-20 01/13/2021   Pneumococcal Conjugate-13 10/21/2013   Pneumococcal Polysaccharide-23 05/04/2016   Tdap 07/22/2019   Zoster Recombinant(Shingrix) 10/30/2018, 01/08/2019   Zoster, Live 01/30/2012    TDAP status: Up to date  Flu Vaccine status: Up to date  Pneumococcal vaccine status: Up to date  Covid-19 vaccine status: Completed vaccines  Qualifies for Shingles Vaccine? Yes   Zostavax completed Yes   Shingrix Completed?: Yes  Screening Tests Health  Maintenance  Topic Date Due   COVID-19 Vaccine (4 - 2023-24 season) 12/10/2021   FOOT EXAM  07/29/2022   HEMOGLOBIN A1C  07/30/2022   OPHTHALMOLOGY EXAM  10/29/2022   INFLUENZA VACCINE  11/10/2022   MAMMOGRAM  12/10/2022   Medicare Annual Wellness (AWV)  12/05/2023   DTaP/Tdap/Td (2 - Td or Tdap) 07/21/2029   Pneumonia Vaccine 12+ Years old  Completed   DEXA SCAN  Completed   Zoster Vaccines- Shingrix  Completed   HPV VACCINES  Aged Out    Health Maintenance  Health Maintenance Due  Topic Date Due   COVID-19 Vaccine (4 - 2023-24 season) 12/10/2021   FOOT EXAM  07/29/2022   HEMOGLOBIN A1C  07/30/2022   OPHTHALMOLOGY EXAM  10/29/2022   INFLUENZA VACCINE  11/10/2022    Colorectal cancer screening: No longer required.   Mammogram status: Completed 8/23. Repeat every year Has received a letter and will call and schedule  Bone Density status: Completed 8/23. Results reflect: Bone density results: OSTEOPENIA. Repeat every 2 years.  Lung Cancer Screening: (Low Dose CT Chest recommended if Age 17-80 years, 20 pack-year currently smoking OR have quit w/in 15years.) does not qualify.     Additional  Screening:  Hepatitis C Screening: does not qualify; Completed NA age  Vision Screening: Recommended annual ophthalmology exams for early detection of glaucoma and other disorders of the eye. Is the patient up to date with their annual eye exam?  Yes  Who is the provider or what is the name of the office in which the patient attends annual eye exams? [facility] If pt is not established with a provider, would they like to be referred to a provider to establish care? No .   Dental Screening: Recommended annual dental exams for proper oral hygiene  Diabetic Foot Exam: Diabetic Foot Exam: Completed 4/23  Community Resource Referral / Chronic Care Management: CRR required this visit?  No   CCM required this visit?  No     Plan:     I have personally reviewed and noted the following in the patient's chart:   Medical and social history Use of alcohol, tobacco or illicit drugs  Current medications and supplements including opioid prescriptions. Patient is not currently taking opioid prescriptions. Functional ability and status Nutritional status Physical activity Advanced directives List of other physicians Hospitalizations, surgeries, and ER visits in previous 12 months Vitals Screenings to include cognitive, depression, and falls Referrals and appointments  In addition, I have reviewed and discussed with patient certain preventive protocols, quality metrics, and best practice recommendations. A written personalized care plan for preventive services as well as general preventive health recommendations were provided to patient.     Sydell Axon, LPN   2/84/1324   After Visit Summary: (MyChart) Due to this being a telephonic visit, the after visit summary with patients personalized plan was offered to patient via MyChart   Nurse Notes: None

## 2022-12-05 NOTE — Patient Instructions (Signed)
Mary Howe , Thank you for taking time to come for your Medicare Wellness Visit. I appreciate your ongoing commitment to your health goals. Please review the following plan we discussed and let me know if I can assist you in the future.   Referrals/Orders/Follow-Ups/Clinician Recommendations: None  This is a list of the screening recommended for you and due dates:  Health Maintenance  Topic Date Due   COVID-19 Vaccine (4 - 2023-24 season) 12/10/2021   Complete foot exam   07/29/2022   Hemoglobin A1C  07/30/2022   Eye exam for diabetics  10/29/2022   Flu Shot  11/10/2022   Mammogram  12/10/2022   Medicare Annual Wellness Visit  12/05/2023   DTaP/Tdap/Td vaccine (2 - Td or Tdap) 07/21/2029   Pneumonia Vaccine  Completed   DEXA scan (bone density measurement)  Completed   Zoster (Shingles) Vaccine  Completed   HPV Vaccine  Aged Out    Advanced directives: (Copy Requested) Please bring a copy of your health care power of attorney and living will to the office to be added to your chart at your convenience.  Next Medicare Annual Wellness Visit scheduled for next year: Yes 12/12/23 @ 3:00

## 2022-12-05 NOTE — Telephone Encounter (Signed)
Left voice mail to schedule appt from recall and for med refill.

## 2022-12-08 ENCOUNTER — Encounter: Payer: Self-pay | Admitting: Cardiovascular Disease

## 2022-12-08 NOTE — Telephone Encounter (Signed)
Left voice mail to schedule appt. Called 3x, unable to reach letter being sent out via mail

## 2023-01-01 ENCOUNTER — Other Ambulatory Visit: Payer: Self-pay | Admitting: Cardiovascular Disease

## 2023-01-02 ENCOUNTER — Other Ambulatory Visit: Payer: Self-pay | Admitting: *Deleted

## 2023-01-02 MED ORDER — FUROSEMIDE 20 MG PO TABS: ORAL_TABLET | ORAL | 0 refills | Status: DC

## 2023-01-02 NOTE — Telephone Encounter (Signed)
Please contact pt for future appointment. Pt overdue for 12 month f/u.  Pt needs refills.

## 2023-01-02 NOTE — Telephone Encounter (Signed)
Pt scheduled on 11/5

## 2023-01-12 ENCOUNTER — Ambulatory Visit: Payer: Medicare HMO | Admitting: Internal Medicine

## 2023-01-13 ENCOUNTER — Ambulatory Visit (INDEPENDENT_AMBULATORY_CARE_PROVIDER_SITE_OTHER): Payer: Medicare HMO | Admitting: Internal Medicine

## 2023-01-13 ENCOUNTER — Encounter: Payer: Self-pay | Admitting: Internal Medicine

## 2023-01-13 ENCOUNTER — Other Ambulatory Visit: Payer: Self-pay

## 2023-01-13 VITALS — BP 110/64 | HR 76 | Temp 97.6°F | Ht 64.0 in | Wt 141.4 lb

## 2023-01-13 DIAGNOSIS — E1169 Type 2 diabetes mellitus with other specified complication: Secondary | ICD-10-CM | POA: Diagnosis not present

## 2023-01-13 DIAGNOSIS — F411 Generalized anxiety disorder: Secondary | ICD-10-CM

## 2023-01-13 DIAGNOSIS — K219 Gastro-esophageal reflux disease without esophagitis: Secondary | ICD-10-CM

## 2023-01-13 DIAGNOSIS — M791 Myalgia, unspecified site: Secondary | ICD-10-CM

## 2023-01-13 DIAGNOSIS — I152 Hypertension secondary to endocrine disorders: Secondary | ICD-10-CM

## 2023-01-13 DIAGNOSIS — I700 Atherosclerosis of aorta: Secondary | ICD-10-CM

## 2023-01-13 DIAGNOSIS — J9611 Chronic respiratory failure with hypoxia: Secondary | ICD-10-CM | POA: Diagnosis not present

## 2023-01-13 DIAGNOSIS — E785 Hyperlipidemia, unspecified: Secondary | ICD-10-CM | POA: Diagnosis not present

## 2023-01-13 DIAGNOSIS — E039 Hypothyroidism, unspecified: Secondary | ICD-10-CM

## 2023-01-13 DIAGNOSIS — J430 Unilateral pulmonary emphysema [MacLeod's syndrome]: Secondary | ICD-10-CM | POA: Diagnosis not present

## 2023-01-13 DIAGNOSIS — E1159 Type 2 diabetes mellitus with other circulatory complications: Secondary | ICD-10-CM

## 2023-01-13 DIAGNOSIS — Z1231 Encounter for screening mammogram for malignant neoplasm of breast: Secondary | ICD-10-CM

## 2023-01-13 DIAGNOSIS — T466X5A Adverse effect of antihyperlipidemic and antiarteriosclerotic drugs, initial encounter: Secondary | ICD-10-CM

## 2023-01-13 MED ORDER — LEVOTHYROXINE SODIUM 50 MCG PO TABS
50.0000 ug | ORAL_TABLET | Freq: Every day | ORAL | 1 refills | Status: DC
  Filled 2023-01-13 – 2023-02-07 (×3): qty 90, 90d supply, fill #0
  Filled 2023-05-29: qty 90, 90d supply, fill #1

## 2023-01-13 MED ORDER — ATORVASTATIN CALCIUM 20 MG PO TABS
20.0000 mg | ORAL_TABLET | ORAL | 3 refills | Status: DC
  Filled 2023-01-13 – 2023-02-07 (×3): qty 45, 90d supply, fill #0
  Filled 2023-03-28 – 2023-05-29 (×2): qty 45, 90d supply, fill #1
  Filled 2023-09-05: qty 45, 90d supply, fill #2

## 2023-01-13 MED ORDER — BISOPROLOL FUMARATE 5 MG PO TABS
2.5000 mg | ORAL_TABLET | Freq: Every day | ORAL | 1 refills | Status: DC
Start: 2023-01-13 — End: 2023-09-05
  Filled 2023-01-13 – 2023-02-07 (×3): qty 45, 90d supply, fill #0
  Filled 2023-03-28 – 2023-05-29 (×2): qty 45, 90d supply, fill #1

## 2023-01-13 MED ORDER — PANTOPRAZOLE SODIUM 40 MG PO TBEC
40.0000 mg | DELAYED_RELEASE_TABLET | Freq: Every day | ORAL | 1 refills | Status: DC
  Filled 2023-01-13 – 2023-01-24 (×2): qty 90, 90d supply, fill #0
  Filled 2023-05-29: qty 90, 90d supply, fill #1

## 2023-01-13 MED ORDER — ESCITALOPRAM OXALATE 20 MG PO TABS
20.0000 mg | ORAL_TABLET | Freq: Every day | ORAL | 0 refills | Status: DC
  Filled 2023-01-13 – 2023-01-24 (×2): qty 90, 90d supply, fill #0

## 2023-01-13 NOTE — Progress Notes (Unsigned)
Subjective:  Patient ID: Mary Howe, female    DOB: [birth year]  Age: 89 y.o. MRN: [id number]  CC: The primary encounter diagnosis was Encounter for screening mammogram for malignant neoplasm of breast. Diagnoses of Gastroesophageal reflux disease, Hypertension associated with type 2 diabetes mellitus (HCC), Hyperlipidemia associated with type 2 diabetes mellitus (HCC), and Acquired hypothyroidism were also pertinent to this visit.   HPI Mary Howe presents for  Chief Complaint  Patient presents with   Medical Management of Chronic Issues   1) COPD:  no recent exacerbatiions. Treated last in December with prednisone and azthromycin .   She is using pulmicort and albuterol.  Using supplemental oxygen by [place] only  to sleep    2) Aortic atherosclerosis:   Patient is tolerating high potency statin therapy  every other night  for risk reduction and  stabilization of atherosclerotic placque noted on prior abdominal CT which was reviewed during today's visit    3) anxiety/depression:  aggravated by living with her sister Mary Howe. Mary Howe is 95 and hard to live with.    Lexapro dose 10 mg increased today to 20 mg .  Daughter is supportive and giving her devotional books to help   4) HARD OF HEARING DESPITE HEW HEARING AIDES.    Outpatient Medications Prior to Visit  Medication Sig Dispense Refill   acetaminophen (TYLENOL) 500 MG tablet Take 500 mg by mouth every 6 (six) hours as needed.     albuterol (VENTOLIN HFA) 108 (90 Base) MCG/ACT inhaler Inhale 1-2 puffs into the lungs every 4 (four) hours as needed for wheezing or shortness of breath. 1 each 10   atorvastatin (LIPITOR) 20 MG tablet TAKE 1 TABLET(20 MG) BY MOUTH EVERY OTHER DAY 45 tablet 3   bisoprolol (ZEBETA) 5 MG tablet TAKE 1/2 TABLET(2.5 MG) BY MOUTH DAILY 45 tablet 1   budesonide (PULMICORT) 0.5 MG/2ML nebulizer solution USE 1 VIAL VIA NEBULIZER TWICE DAILY 120 mL 11   calcium carbonate (OS-CAL) 600 MG TABS tablet Take 600 mg by  mouth daily with breakfast.      cetirizine (ZYRTEC) 10 MG tablet Take 1 tablet (10 mg total) by mouth daily. 90 tablet 1   escitalopram (LEXAPRO) 10 MG tablet TAKE 1 TABLET(10 MG) BY MOUTH DAILY 90 tablet 1   furosemide (LASIX) 20 MG tablet TAKE 1 TABLET BY MOUTH EVERY DAY. TAKE 1 EXTRA TABLET EVERY EVENING FOR SWELLING. (Patient taking differently: TAKE 1 TABLET BY MOUTH EVERY DAY.) 45 tablet 0   levothyroxine (SYNTHROID) 50 MCG tablet TAKE 1 TABLET(50 MCG) BY MOUTH DAILY AT 6 AM 90 tablet 1   MULTIPLE VITAMIN PO Take 1 tablet by mouth daily.      OXYGEN Inhale 2.5 L daily into the lungs.     pantoprazole (PROTONIX) 40 MG tablet TAKE 1 TABLET(40 MG) BY MOUTH DAILY 90 tablet 1   No facility-administered medications prior to visit.    Review of Systems;  Patient denies headache, fevers, malaise, unintentional weight loss, skin rash, eye pain, sinus congestion and sinus pain, sore throat, dysphagia,  hemoptysis , cough, dyspnea, wheezing, chest pain, palpitations, orthopnea, edema, abdominal pain, nausea, melena, diarrhea, constipation, flank pain, dysuria, hematuria, urinary  Frequency, nocturia, numbness, tingling, seizures,  Focal weakness, Loss of consciousness,  Tremor, insomnia, depression, anxiety, and suicidal ideation.      Objective:  BP 110/64   Pulse 76   Temp 97.6 F (36.4 C) (Oral)   Ht 5\' 4"  (1.626 m)   Wt 141 lb 6.4  oz (64.1 kg)   SpO2 95%   BMI 24.27 kg/m   BP Readings from Last 3 Encounters:  01/13/23 110/64  12/05/22 136/74  01/28/22 116/70    Wt Readings from Last 3 Encounters:  01/13/23 141 lb 6.4 oz (64.1 kg)  12/05/22 137 lb (62.1 kg)  04/05/22 130 lb (59 kg)    Physical Exam  Lab Results  Component Value Date   HGBA1C 5.8 (H) 01/28/2022   HGBA1C 5.8 07/28/2021   HGBA1C 5.9 02/11/2021    Lab Results  Component Value Date   CREATININE 0.60 01/28/2022   CREATININE 0.58 12/10/2021   CREATININE 0.60 07/28/2021    Lab Results  Component Value  Date   WBC 8.0 01/28/2022   HGB 13.5 01/28/2022   HCT 39.5 01/28/2022   PLT 213 01/28/2022   GLUCOSE 106 (H) 01/28/2022   CHOL 204 (H) 01/28/2022   TRIG 103 01/28/2022   HDL 105 01/28/2022   LDLDIRECT 70.0 07/28/2021   LDLCALC 80 01/28/2022   ALT 17 01/28/2022   AST 24 01/28/2022   NA 137 01/28/2022   K 3.8 01/28/2022   CL 98 01/28/2022   CREATININE 0.60 01/28/2022   BUN 10 01/28/2022   CO2 29 01/28/2022   TSH 3.39 01/28/2022   INR 1.09 01/16/2017   HGBA1C 5.8 (H) 01/28/2022   MICROALBUR <0.7 07/28/2021    CT Head Wo Contrast  Result Date: 12/10/2021 CLINICAL DATA:  Trauma. EXAM: CT HEAD WITHOUT CONTRAST CT MAXILLOFACIAL WITHOUT CONTRAST CT CERVICAL SPINE WITHOUT CONTRAST TECHNIQUE: Multidetector CT imaging of the head, cervical spine, and maxillofacial structures were performed using the standard protocol without intravenous contrast. Multiplanar CT image reconstructions of the cervical spine and maxillofacial structures were also generated. RADIATION DOSE REDUCTION: This exam was performed according to the departmental dose-optimization program which includes automated exposure control, adjustment of the mA and/or kV according to patient size and/or use of iterative reconstruction technique. COMPARISON:  None Available. FINDINGS: CT HEAD FINDINGS Brain: Mild age-related atrophy and chronic microvascular ischemic changes. There is no acute intracranial hemorrhage. No mass effect or midline shift. No extra-axial fluid collection. Vascular: No hyperdense vessel or unexpected calcification. Skull: Normal. Negative for fracture or focal lesion. Other: Right periorbital hematoma. CT MAXILLOFACIAL FINDINGS Osseous: There are fractures of the anterior and posterolateral walls of the right maxillary sinus. Probable extension of the fracture into the right nasal maxillary junction. No other acute fracture. No mandibular dislocation. Orbits: There is mild stranding of the right retro-orbital fat.  The globes appear intact. Sinuses: Small air-fluid level in the right maxillary sinus, likely sinus. The remainder of the visualized paranasal sinuses and mastoid air cells are clear. Soft tissues: Right periorbital hematoma. CT CERVICAL SPINE FINDINGS Alignment: No acute subluxation. Skull base and vertebrae: No acute fracture.  Osteopenia. Soft tissues and spinal canal: No prevertebral fluid or swelling. No visible canal hematoma. Disc levels:  No acute findings.  Degenerative changes Upper chest: Emphysema. Other: None IMPRESSION: 1. No acute intracranial pathology. Mild age-related atrophy and chronic microvascular ischemic changes. 2. Fractures of the anterior and posterolateral walls of the right maxillary sinus. 3. No acute/traumatic cervical spine pathology. Electronically Signed   By: Elgie Collard M.D.   On: 12/10/2021 23:02   CT Maxillofacial Wo Contrast  Result Date: 12/10/2021 CLINICAL DATA:  Trauma. EXAM: CT HEAD WITHOUT CONTRAST CT MAXILLOFACIAL WITHOUT CONTRAST CT CERVICAL SPINE WITHOUT CONTRAST TECHNIQUE: Multidetector CT imaging of the head, cervical spine, and maxillofacial structures were performed using the standard  protocol without intravenous contrast. Multiplanar CT image reconstructions of the cervical spine and maxillofacial structures were also generated. RADIATION DOSE REDUCTION: This exam was performed according to the departmental dose-optimization program which includes automated exposure control, adjustment of the mA and/or kV according to patient size and/or use of iterative reconstruction technique. COMPARISON:  None Available. FINDINGS: CT HEAD FINDINGS Brain: Mild age-related atrophy and chronic microvascular ischemic changes. There is no acute intracranial hemorrhage. No mass effect or midline shift. No extra-axial fluid collection. Vascular: No hyperdense vessel or unexpected calcification. Skull: Normal. Negative for fracture or focal lesion. Other: Right periorbital  hematoma. CT MAXILLOFACIAL FINDINGS Osseous: There are fractures of the anterior and posterolateral walls of the right maxillary sinus. Probable extension of the fracture into the right nasal maxillary junction. No other acute fracture. No mandibular dislocation. Orbits: There is mild stranding of the right retro-orbital fat. The globes appear intact. Sinuses: Small air-fluid level in the right maxillary sinus, likely sinus. The remainder of the visualized paranasal sinuses and mastoid air cells are clear. Soft tissues: Right periorbital hematoma. CT CERVICAL SPINE FINDINGS Alignment: No acute subluxation. Skull base and vertebrae: No acute fracture.  Osteopenia. Soft tissues and spinal canal: No prevertebral fluid or swelling. No visible canal hematoma. Disc levels:  No acute findings.  Degenerative changes Upper chest: Emphysema. Other: None IMPRESSION: 1. No acute intracranial pathology. Mild age-related atrophy and chronic microvascular ischemic changes. 2. Fractures of the anterior and posterolateral walls of the right maxillary sinus. 3. No acute/traumatic cervical spine pathology. Electronically Signed   By: Elgie Collard M.D.   On: 12/10/2021 23:02   CT Cervical Spine Wo Contrast  Result Date: 12/10/2021 CLINICAL DATA:  Trauma. EXAM: CT HEAD WITHOUT CONTRAST CT MAXILLOFACIAL WITHOUT CONTRAST CT CERVICAL SPINE WITHOUT CONTRAST TECHNIQUE: Multidetector CT imaging of the head, cervical spine, and maxillofacial structures were performed using the standard protocol without intravenous contrast. Multiplanar CT image reconstructions of the cervical spine and maxillofacial structures were also generated. RADIATION DOSE REDUCTION: This exam was performed according to the departmental dose-optimization program which includes automated exposure control, adjustment of the mA and/or kV according to patient size and/or use of iterative reconstruction technique. COMPARISON:  None Available. FINDINGS: CT HEAD FINDINGS  Brain: Mild age-related atrophy and chronic microvascular ischemic changes. There is no acute intracranial hemorrhage. No mass effect or midline shift. No extra-axial fluid collection. Vascular: No hyperdense vessel or unexpected calcification. Skull: Normal. Negative for fracture or focal lesion. Other: Right periorbital hematoma. CT MAXILLOFACIAL FINDINGS Osseous: There are fractures of the anterior and posterolateral walls of the right maxillary sinus. Probable extension of the fracture into the right nasal maxillary junction. No other acute fracture. No mandibular dislocation. Orbits: There is mild stranding of the right retro-orbital fat. The globes appear intact. Sinuses: Small air-fluid level in the right maxillary sinus, likely sinus. The remainder of the visualized paranasal sinuses and mastoid air cells are clear. Soft tissues: Right periorbital hematoma. CT CERVICAL SPINE FINDINGS Alignment: No acute subluxation. Skull base and vertebrae: No acute fracture.  Osteopenia. Soft tissues and spinal canal: No prevertebral fluid or swelling. No visible canal hematoma. Disc levels:  No acute findings.  Degenerative changes Upper chest: Emphysema. Other: None IMPRESSION: 1. No acute intracranial pathology. Mild age-related atrophy and chronic microvascular ischemic changes. 2. Fractures of the anterior and posterolateral walls of the right maxillary sinus. 3. No acute/traumatic cervical spine pathology. Electronically Signed   By: Elgie Collard M.D.   On: 12/10/2021 23:02  Assessment & Plan:  .Encounter for screening mammogram for malignant neoplasm of breast  Gastroesophageal reflux disease  Hypertension associated with type 2 diabetes mellitus (HCC) -     Hemoglobin A1c -     Comprehensive metabolic panel -     Microalbumin / creatinine urine ratio  Hyperlipidemia associated with type 2 diabetes mellitus (HCC) -     Hemoglobin A1c -     Lipid panel -     LDL cholesterol, direct  Acquired  hypothyroidism -     TSH     I provided 30 minutes of face-to-face time during this encounter reviewing patient's last visit with me, patient's  most recent visit with cardiology,  nephrology,  and neurology,  recent surgical and non surgical procedures, previous  labs and imaging studies, counseling on currently addressed issues,  and post visit ordering to diagnostics and therapeutics .   Follow-up: No follow-ups on file.   Sherlene Shams, MD

## 2023-01-13 NOTE — Assessment & Plan Note (Signed)
Secondary to tobacco abuse.  Previously managed by Synthia Innocent,  no follow up since 2018 . She is asymptomatic and not  hypoxic at rest or with short walks  . She has a Portable oxygen tank but is not currently using it except at night. Continue nocturnal use of supplemental oxygen and  ICS  And bronchodilators. Pulmonology referral requested by daughter Dedra Skeens and in process

## 2023-01-13 NOTE — Assessment & Plan Note (Signed)
Historically well-controlled on diet alone .  hemoglobin A1c has been consistently at or  less than 7.0 . Patient is up-to-date on eye exams and foot exam is normal today. Patient has no microalbuminuria. Patient is intolerant of statin therapy  secondary to statin myalgia with previous trial of simvastatin; however her HDL:LDL  ratio is 1.   Lab Results  Component Value Date   HGBA1C 5.8 (H) 01/28/2022    Lab Results  Component Value Date   MICROALBUR <0.7 07/28/2021   MICROALBUR <0.7 07/10/2020     Lab Results  Component Value Date   CHOL 204 (H) 01/28/2022   HDL 105 01/28/2022   LDLCALC 80 01/28/2022   LDLDIRECT 70.0 07/28/2021   TRIG 103 01/28/2022   CHOLHDL 1.9 01/28/2022

## 2023-01-13 NOTE — Assessment & Plan Note (Signed)
She is tolerating every other day atorvastatin

## 2023-01-13 NOTE — Patient Instructions (Addendum)
YOUR MAMMOGRAM IS DUE, PLEASE CALL AND GET THIS SCHEDULED! [facility] - call [phone]   I have increased the lexapro to 20 mg daily  I recommend inviting [name] to have devotional time during the day    The flu vaccine should be received before the end of October   Pulmonology referral has been made   All medications have been sent to [facility] for refill

## 2023-01-14 LAB — HEMOGLOBIN A1C
Hgb A1c MFr Bld: 5.8 %{Hb} — ABNORMAL HIGH (ref <5.7)
Mean Plasma Glucose: 120 mg/dL
eAG (mmol/L): 6.6 mmol/L

## 2023-01-14 LAB — LIPID PANEL
Cholesterol: 195 mg/dL (ref <200)
HDL: 113 mg/dL (ref >=50)
LDL Cholesterol (Calc): 65 mg/dL
Non-HDL Cholesterol (Calc): 82 mg/dL (ref <130)
Total CHOL/HDL Ratio: 1.7 (calc) (ref <5.0)
Triglycerides: 83 mg/dL (ref <150)

## 2023-01-14 LAB — COMPREHENSIVE METABOLIC PANEL
AG Ratio: 1.6 (calc) (ref 1.0–2.5)
ALT: 23 U/L (ref 6–29)
AST: 27 U/L (ref 10–35)
Albumin: 4.3 g/dL (ref 3.6–5.1)
Alkaline phosphatase (APISO): 67 U/L (ref 37–153)
BUN/Creatinine Ratio: 10 (calc) (ref 6–22)
BUN: 6 mg/dL — ABNORMAL LOW (ref 7–25)
CO2: 25 mmol/L (ref 20–32)
Calcium: 9.3 mg/dL (ref 8.6–10.4)
Chloride: 98 mmol/L (ref 98–110)
Creat: 0.62 mg/dL (ref 0.60–0.95)
Globulin: 2.7 g/dL (ref 1.9–3.7)
Glucose, Bld: 139 mg/dL — ABNORMAL HIGH (ref 65–99)
Potassium: 4.7 mmol/L (ref 3.5–5.3)
Sodium: 137 mmol/L (ref 135–146)
Total Bilirubin: 0.6 mg/dL (ref 0.2–1.2)
Total Protein: 7 g/dL (ref 6.1–8.1)

## 2023-01-14 LAB — TSH: TSH: 1.43 m[IU]/L (ref 0.40–4.50)

## 2023-01-14 LAB — MICROALBUMIN / CREATININE URINE RATIO
Creatinine, Urine: 30 mg/dL (ref 20–275)
Microalb Creat Ratio: 13 mg/g{creat} (ref <30)
Microalb, Ur: 0.4 mg/dL

## 2023-01-14 LAB — LDL CHOLESTEROL, DIRECT: Direct LDL: 72 mg/dL (ref <100)

## 2023-01-15 NOTE — Assessment & Plan Note (Signed)
secondary TO  COPD .  Using oxygen at night only

## 2023-01-15 NOTE — Assessment & Plan Note (Signed)
Historically well-controlled on diet alone .  hemoglobin A1c has been consistently at or  less than 6.0 . Patient is up-to-date on eye exams and foot exam is normal today. Patient has no microalbuminuria. Patient is tolerating every other day statin therapy for CAD risk reduction.  Lab Results  Component Value Date   HGBA1C 5.8 (H) 01/13/2023   . Lab Results  Component Value Date   CHOL 195 01/13/2023   HDL 113 01/13/2023   LDLCALC 65 01/13/2023   LDLDIRECT 72 01/13/2023   TRIG 83 01/13/2023   CHOLHDL 1.7 01/13/2023   Lab Results  Component Value Date   MICROALBUR 0.4 01/13/2023   MICROALBUR <0.7 07/28/2021

## 2023-01-15 NOTE — Assessment & Plan Note (Signed)
Symptoms are aggravated by living with adelaide  increase lexapro to 20 mg daily

## 2023-01-15 NOTE — Assessment & Plan Note (Addendum)
Reviewed  She is tolerating atorvastatin 2 -3 times weekly

## 2023-01-20 ENCOUNTER — Other Ambulatory Visit: Payer: Self-pay

## 2023-01-24 ENCOUNTER — Other Ambulatory Visit: Payer: Self-pay

## 2023-02-07 ENCOUNTER — Other Ambulatory Visit: Payer: Self-pay

## 2023-02-13 ENCOUNTER — Other Ambulatory Visit: Payer: Self-pay

## 2023-02-13 NOTE — Progress Notes (Unsigned)
[facility] radial cardiology Office Note  Date:  02/14/2023   ID:  Mary Howe, DOB 07-Mar-1933, MRN [id number]  PCP:  Sherlene Shams, MD   Chief Complaint  Patient presents with   12 month follow up     Patient c/o shortness of breath with little exertion. Medications reviewed by the patient verbally.     HPI:  89 y.o. female with a history of  COPD,  2018 with acute on chronic hypoxic and hypercapnic respiratory failure in the setting of atypical pulmonary infection and AE COPD. Acute encephalopathy and sepsis with hypotension requiring vasopressors, EF 25% diagnosis of Takotsubo cardiomyopathy  hypertension,  hyperlipidemia,  GERD,  mild pulmonary hypertension,  Ef 60% in 01/2017, up from 25% in October 2018 Nonischemic by cardiac catheterization October 2018 who presents for follow-up of her cardiomyopathy  Last seen by myself in clinic May 2023   presents with her daughter Continues to live with her sister who is 52 years old and independent Less active, used to go to the gym in the past Walks in the house, does stairs, tries to stay active  Weight stable No leg swelling no PND orthopnea On Lasix daily, has not needed extra Lasix  Scheduled to see pulmonary next week No recent OPD exacerbation  BP at home 120 to 130s systolics, if it does run high he will improve if she sits for few minutes  echocardiogram December 2018, ejection fraction 60%  Labs: Reviewed CBC nl Total chol 195 A1C 5.8 LDL 72 Normal BMP  Prior imaging reviewed CT chest 2018: mild diffuse aortic atherosclerosis  EKG personally reviewed by myself on todays visit EKG Interpretation Date/Time:  Tuesday February 14 2023 15:26:28 EST Ventricular Rate:  74 PR Interval:  180 QRS Duration:  74 QT Interval:  430 QTC Calculation: 477 R Axis:   -17  Text Interpretation: Normal sinus rhythm Normal ECG When compared with ECG of 10-Dec-2021 22:16, No significant change was found Confirmed by  Julien Nordmann [phone]) on 02/14/2023 3:33:19 PM    Cardiac catheterization January 16, 2017, normal coronary arteries, ejection fraction 35 to 45% with hypokinesis of distal segments suggestive of stress-induced cardiomyopathy  OTHER PAST MEDICAL HISTORY REVIEWED BY ME FOR TODAY'S VISIT: Lots of stress, takes care of sister who is 64 Doing pulmonary rehab, exercising well, feels good  Follow-up echocardiogram 03/16/2017 with ejection fraction 55-60%. Using oxygen at nighttime alone  Echocardiogram on 01/12/2017 showed LV dysfunction with an EF of 25-30% with severe global hypokinesis. Troponin 0.54.   Diagnostic catheterization on 01/16/2017 revealed normal coronary arteries, normal right heart filling pressures, and mild pulmonary hypertension.  EF on ventriculography has improved some to 35-45%.   October 2018 admitted with acute on chronic hypoxic and hypercapnic respiratory failure in the setting of atypical pulmonary infection and AE COPD. Acute encephalopathy and sepsis with hypotension requiring vasopressors. She was discharged on low-dose bisoprolol and losartan. The losartan was discontinued in the setting of soft blood pressures.  PMH:   has a past medical history of CHF (congestive heart failure) (HCC), COPD (chronic obstructive pulmonary disease) (HCC), Dental bridge present, Diverticulitis (2015), Essential hypertension, GERD (gastroesophageal reflux disease), Hyperlipidemia, PAH (pulmonary artery hypertension) (HCC), Shingles outbreak (01/30/2016), Takotsubo cardiomyopathy, Thyroid disease, and Wears hearing aid in both ears.  PSH:    Past Surgical History:  Procedure Laterality Date   CATARACT EXTRACTION W/PHACO Right 10/10/2017   Procedure: CATARACT EXTRACTION PHACO AND INTRAOCULAR LENS PLACEMENT (IOC)  RIGHT;  Surgeon: Lockie Mola, MD;  Location: [facility];  Service: Ophthalmology;  Laterality: Right;   CATARACT EXTRACTION W/PHACO Left 11/15/2017   Procedure:  CATARACT EXTRACTION PHACO AND INTRAOCULAR LENS PLACEMENT (IOC)  LEFT;  Surgeon: Lockie Mola, MD;  Location: [facility];  Service: Ophthalmology;  Laterality: Left;   CERVICAL CONE BIOPSY     CHOLECYSTECTOMY     HEMORROIDECTOMY     RIGHT/LEFT HEART CATH AND CORONARY ANGIOGRAPHY N/A 01/16/2017   Procedure: RIGHT/LEFT HEART CATH AND CORONARY ANGIOGRAPHY;  Surgeon: Iran Ouch, MD;  Location: [facility];  Service: Cardiovascular;  Laterality: N/A;    Current Outpatient Medications  Medication Sig Dispense Refill   acetaminophen (TYLENOL) 500 MG tablet Take 500 mg by mouth every 6 (six) hours as needed.     albuterol (VENTOLIN HFA) 108 (90 Base) MCG/ACT inhaler Inhale 1-2 puffs into the lungs every 4 (four) hours as needed for wheezing or shortness of breath. 1 each 10   atorvastatin (LIPITOR) 20 MG tablet Take 1 tablet (20 mg total) by mouth every other day. 45 tablet 3   bisoprolol (ZEBETA) 5 MG tablet Take 0.5 tablets (2.5 mg total) by mouth daily. 45 tablet 1   budesonide (PULMICORT) 0.5 MG/2ML nebulizer solution USE 1 VIAL VIA NEBULIZER TWICE DAILY 120 mL 11   calcium carbonate (OS-CAL) 600 MG TABS tablet Take 600 mg by mouth daily with breakfast.      cetirizine (ZYRTEC) 10 MG tablet Take 1 tablet (10 mg total) by mouth daily. 90 tablet 1   escitalopram (LEXAPRO) 20 MG tablet Take 1 tablet (20 mg total) by mouth daily. 90 tablet 0   furosemide (LASIX) 20 MG tablet TAKE 1 TABLET BY MOUTH EVERY DAY. TAKE 1 EXTRA TABLET EVERY EVENING FOR SWELLING. 45 tablet 0   levothyroxine (SYNTHROID) 50 MCG tablet Take 1 tablet (50 mcg total) by mouth daily at 6 (six) AM. 90 tablet 1   MULTIPLE VITAMIN PO Take 1 tablet by mouth daily.      OXYGEN Inhale 2.5 L daily into the lungs.     pantoprazole (PROTONIX) 40 MG tablet Take 1 tablet (40 mg total) by mouth daily. 90 tablet 1   No current facility-administered medications for this visit.   Allergies:   Simvastatin    Social History:  The patient  reports that she quit smoking about 34 years ago. Her smoking use included cigarettes. She started smoking about 63 years ago. She has a 43.5 pack-year smoking history. She has never used smokeless tobacco. She reports current alcohol use of about 7.0 standard drinks of alcohol per week. She reports that she does not use drugs.   Family History:   family history includes Aneurysm in her sister; Bone cancer in her sister; Brain cancer in her brother; CAD in her brother and brother; Emphysema in her sister; Fibrocystic breast disease in her sister; Heart attack in her brother; Heart disease in her brother and sister; Lung disease in her brother; Pneumonia in her brother; Prostate cancer in her father.    Review of Systems  Constitutional: Negative.   HENT: Negative.    Respiratory: Negative.    Cardiovascular: Negative.   Gastrointestinal: Negative.   Musculoskeletal: Negative.   Neurological: Negative.   Psychiatric/Behavioral: Negative.    All other systems reviewed and are negative.  PHYSICAL EXAM: VS:  BP 122/60 (BP Location: Left Arm, Patient Position: Sitting, Cuff Size: Normal)   Pulse 74   Ht 5\' 4"  (1.626 m)   Wt 141 lb 6  oz (64.1 kg)   SpO2 94%   BMI 24.27 kg/m  , BMI Body mass index is 24.27 kg/m. Constitutional:  oriented to person, place, and time. No distress.  HENT:  Head: Grossly normal Eyes:  no discharge. No scleral icterus.  Neck: No JVD, no carotid bruits  Cardiovascular: Regular rate and rhythm, no murmurs appreciated Pulmonary/Chest: Clear to auscultation bilaterally, no wheezes or rails Abdominal: Soft.  no distension.  no tenderness.  Musculoskeletal: Normal range of motion Neurological:  normal muscle tone. Coordination normal. No atrophy Skin: Skin warm and dry Psychiatric: normal affect, pleasant  Recent Labs: 01/13/2023: ALT 23; BUN 6; Creat 0.62; Potassium 4.7; Sodium 137; TSH 1.43    Lipid Panel Lab Results   Component Value Date   CHOL 195 01/13/2023   HDL 113 01/13/2023   LDLCALC 65 01/13/2023   TRIG 83 01/13/2023    Wt Readings from Last 3 Encounters:  02/14/23 141 lb 6 oz (64.1 kg)  01/13/23 141 lb 6.4 oz (64.1 kg)  12/05/22 137 lb (62.1 kg)     ASSESSMENT AND PLAN:  NICM (nonischemic cardiomyopathy) (HCC)  In the setting of acute respiratory failure /pneumonia/COPD exacerbation causing stress cardiomyopathy,  2018 EF normalized late 2018, stable since that time Blood pressure stable, Lasix daily Recommended extra Lasix in the afternoon for any worsening shortness of breath spells  Essential hypertension Blood pressure is well controlled on today's visit. No changes made to the medications.  Centrilobular emphysema (HCC) Denies any recent COPD exacerbation, previously on inhalers Uses nebulizer at home Followed by pulmonary  Aortic atherosclerosis: Mild diffuse, disease noted on CT scan Tolerating low-dose statin, numbers improved  Lower extremity edema Lasix daily, occasionally extra Lasix in the afternoon No significant leg edema, appears euvolemic  Hypercholesterolemia cardiac catheterization with no significant disease CT scan with aorta plaque, mild diffuse On low-dose statin  Diabetes type 2 We have encouraged continued exercise, careful diet management  Has stopped working out at the gym   Orders Placed This Encounter  Procedures   EKG 12-Lead     Signed, Dossie Arbour, M.D., Ph.D. 02/14/2023  [facility] [place], [place] [phone]

## 2023-02-14 ENCOUNTER — Other Ambulatory Visit: Payer: Self-pay

## 2023-02-14 ENCOUNTER — Ambulatory Visit: Payer: Medicare HMO | Attending: Cardiovascular Disease | Admitting: Cardiovascular Disease

## 2023-02-14 ENCOUNTER — Encounter: Payer: Self-pay | Admitting: Cardiovascular Disease

## 2023-02-14 VITALS — BP 122/60 | HR 74 | Ht 64.0 in | Wt 141.4 lb

## 2023-02-14 DIAGNOSIS — I428 Other cardiomyopathies: Secondary | ICD-10-CM

## 2023-02-14 DIAGNOSIS — E1169 Type 2 diabetes mellitus with other specified complication: Secondary | ICD-10-CM

## 2023-02-14 DIAGNOSIS — T466X5A Adverse effect of antihyperlipidemic and antiarteriosclerotic drugs, initial encounter: Secondary | ICD-10-CM | POA: Diagnosis not present

## 2023-02-14 DIAGNOSIS — I700 Atherosclerosis of aorta: Secondary | ICD-10-CM

## 2023-02-14 DIAGNOSIS — M791 Myalgia, unspecified site: Secondary | ICD-10-CM | POA: Diagnosis not present

## 2023-02-14 DIAGNOSIS — I10 Essential (primary) hypertension: Secondary | ICD-10-CM

## 2023-02-14 DIAGNOSIS — E7800 Pure hypercholesterolemia, unspecified: Secondary | ICD-10-CM | POA: Diagnosis not present

## 2023-02-14 DIAGNOSIS — E785 Hyperlipidemia, unspecified: Secondary | ICD-10-CM | POA: Diagnosis not present

## 2023-02-14 DIAGNOSIS — J430 Unilateral pulmonary emphysema [MacLeod's syndrome]: Secondary | ICD-10-CM

## 2023-02-14 MED ORDER — FUROSEMIDE 20 MG PO TABS
20.0000 mg | ORAL_TABLET | Freq: Every day | ORAL | 2 refills | Status: DC
  Filled 2023-02-14 (×2): qty 90, 45d supply, fill #0
  Filled 2023-03-28: qty 90, 45d supply, fill #1
  Filled 2023-05-29: qty 90, 45d supply, fill #2

## 2023-02-14 NOTE — Patient Instructions (Signed)

## 2023-02-17 ENCOUNTER — Ambulatory Visit
Admission: RE | Admit: 2023-02-17 | Discharge: 2023-02-17 | Disposition: A | Discharge status: Home / Self Care | Payer: Medicare HMO | Source: Ambulatory Visit | Attending: Internal Medicine | Admitting: Internal Medicine

## 2023-02-17 DIAGNOSIS — Z1231 Encounter for screening mammogram for malignant neoplasm of breast: Secondary | ICD-10-CM | POA: Insufficient documentation

## 2023-02-21 NOTE — Progress Notes (Unsigned)
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   Neuropathy 02/19/2015   Phlebectasia 02/19/2015   Stasis, venous 02/19/2015   Age related osteoporosis 01/30/2016   History of Graves' disease 01/30/2016   COPD (chronic obstructive pulmonary disease) (HCC) 02/06/2016   Post-nasal drip 05/07/2016   Anxiety, generalized 06/22/2016   Renal cyst, left 11/25/2016   S/P cholecystectomy 11/25/2016   Myalgia due to statin 12/17/2016   NICM (nonischemic cardiomyopathy) (HCC) 05/22/2017   Hypertension associated with type 2 diabetes mellitus (HCC) 06/07/2018   Hyperlipidemia associated with type 2 diabetes mellitus (HCC) 07/12/2020   Acquired hypothyroidism 07/12/2020   Chronic respiratory failure with hypoxia (HCC) 07/31/2020   Aortic atherosclerosis (HCC) 01/14/2021   B12 deficiency 02/12/2021   Tremor of left hand 07/28/2021   History of fall within past 90 days 07/29/2021   COPD exacerbation (HCC) 01/28/2022   Alcohol abuse 01/30/2022   Resolved Ambulatory Problems    Diagnosis Date Noted   Hypercholesterolemia 02/19/2015   Chemical diabetes 02/19/2015   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   Avitaminosis D 02/19/2015   Acute respiratory failure with hypercapnia (HCC) 01/12/2016   Shingles outbreak 01/30/2016   Panlobular emphysema (HCC) 01/30/2016   Essential hypertension 01/30/2016   Hospital discharge follow-up 01/30/2016   Back pain 06/10/2016   COPD with acute exacerbation (HCC) 06/10/2016   Diabetes mellitus without complication (HCC) 07/03/2016   Elevated liver enzymes 11/12/2016   Pancreatic mass 11/25/2016   CAP (community acquired pneumonia) 01/11/2017   Acute respiratory failure with hypoxia (HCC) 01/11/2017   Elevated troponin 01/11/2017   Acute systolic heart  failure (HCC)    Nocturnal hypoxia 05/22/2017   Leg swelling 06/13/2017   Close exposure to COVID-19 virus 11/04/2018   Contact dermatitis due to plant 11/03/2019   Past Medical History:  Diagnosis Date   CHF (congestive heart failure) (HCC)    Dental bridge present    Diverticulitis 2015   GERD (gastroesophageal reflux disease)    Hyperlipidemia    PAH (pulmonary artery hypertension) (HCC)    Takotsubo cardiomyopathy    Thyroid disease    Wears hearing aid in both ears      Past Surgical History:   Past Surgical History:  Procedure Laterality Date   CATARACT EXTRACTION W/PHACO Right 10/10/2017   Procedure: CATARACT EXTRACTION PHACO AND INTRAOCULAR LENS PLACEMENT (IOC)  RIGHT;  Surgeon: Lockie Mola, MD;  Location: [facility];  Service: Ophthalmology;  Laterality: Right;   CATARACT EXTRACTION W/PHACO Left 11/15/2017   Procedure: CATARACT EXTRACTION PHACO AND INTRAOCULAR LENS PLACEMENT (IOC)  LEFT;  Surgeon: Lockie Mola, MD;  Location: [facility];  Service: Ophthalmology;  Laterality: Left;   CERVICAL CONE BIOPSY     CHOLECYSTECTOMY     HEMORROIDECTOMY     RIGHT/LEFT HEART CATH AND CORONARY ANGIOGRAPHY N/A 01/16/2017   Procedure: RIGHT/LEFT HEART CATH AND CORONARY ANGIOGRAPHY;  Surgeon: Iran Ouch, MD;  Location: [facility] INVASIVE CV LAB;  Service: Cardiovascular;  Laterality: N/A;     Brief Summary/SYNOPSIS       CC      Subjective:    Physical Exam:     Review of Systems: Gen:  Denies  fever, sweats, chills weight loss  HEENT: Denies blurred vision, double vision, ear pain, eye pain, hearing loss, nose bleeds, sore throat Cardiac:  No dizziness, chest pain or heaviness, chest tightness,edema, No JVD Resp:   No cough, -sputum production, -shortness of breath,-wheezing, -hemoptysis,  Other:  All other systems negative   Physical Examination:   General Appearance: No distress  EYES PERRLA, EOM intact.   NECK Supple, No  JVD Pulmonary: normal breath sounds, No wheezing.  CardiovascularNormal S1,S2.  No m/r/g.   Abdomen: Benign, Soft, non-tender. Neurology UE/LE 5/5 strength, no focal deficits Ext pulses intact, cap refill intact ALL OTHER ROS ARE NEGATIVE   Pulmonary testing:    PFTs (02/04/16): very mild obstruction (FEV1 1.6 liters, 97% pred, FEV1/FVC 61%, flow-vol curve c/w obstruction), Normal lung volumes, mild decrease in DLCO  Chest Imaging:  CXR (01/13/16):  Hyperinflation, NACPD CT chest (01/12/16): severe B upper lobe emphysema  Sleep Tests:    Cardiac Tests:  ECHO 2018 NL EF, Grade 1 diastolic dysfunction  Social History:    Family History:       Assessment/Plan:      MEDICATION ADJUSTMENTS/LABS AND TESTS ORDERED:    CURRENT MEDICATIONS REVIEWED AT LENGTH WITH PATIENT TODAY   Patient  satisfied with Plan of action and management. All questions answered  Follow up 6 months   Time Spent Involved in Patient Care on Day of Examination:    Teddie Curd Santiago Glad, M.D.  [facility]  Medical Director [facility] Medical Director [facility] Cardio-Pulmonary Department

## 2023-02-22 ENCOUNTER — Ambulatory Visit: Payer: Medicare HMO | Admitting: Internal Medicine

## 2023-02-22 ENCOUNTER — Other Ambulatory Visit: Payer: Self-pay

## 2023-02-22 ENCOUNTER — Ambulatory Visit
Admission: RE | Admit: 2023-02-22 | Discharge: 2023-02-22 | Disposition: A | Discharge status: Home / Self Care | Payer: Medicare HMO | Source: Ambulatory Visit | Attending: Internal Medicine | Admitting: Internal Medicine

## 2023-02-22 ENCOUNTER — Encounter: Payer: Self-pay | Admitting: Internal Medicine

## 2023-02-22 VITALS — BP 124/78 | HR 90 | Temp 97.5°F | Ht 64.0 in | Wt 142.2 lb

## 2023-02-22 DIAGNOSIS — R0689 Other abnormalities of breathing: Secondary | ICD-10-CM | POA: Insufficient documentation

## 2023-02-22 DIAGNOSIS — I517 Cardiomegaly: Secondary | ICD-10-CM | POA: Diagnosis not present

## 2023-02-22 DIAGNOSIS — J9611 Chronic respiratory failure with hypoxia: Secondary | ICD-10-CM | POA: Diagnosis not present

## 2023-02-22 DIAGNOSIS — R918 Other nonspecific abnormal finding of lung field: Secondary | ICD-10-CM | POA: Diagnosis not present

## 2023-02-22 DIAGNOSIS — J439 Emphysema, unspecified: Secondary | ICD-10-CM | POA: Diagnosis not present

## 2023-02-22 DIAGNOSIS — R911 Solitary pulmonary nodule: Secondary | ICD-10-CM | POA: Diagnosis not present

## 2023-02-22 MED ORDER — IOHEXOL 350 MG/ML SOLN
75.0000 mL | Freq: Once | INTRAVENOUS | Status: AC | PRN
  Administered 2023-02-22: 75 mL via INTRAVENOUS

## 2023-02-22 MED ORDER — ARFORMOTEROL TARTRATE 15 MCG/2ML IN NEBU
15.0000 ug | INHALATION_SOLUTION | Freq: Two times a day (BID) | RESPIRATORY_TRACT | 10 refills | Status: DC
  Filled 2023-02-22: qty 120, 30d supply, fill #0
  Filled 2023-03-28: qty 120, 30d supply, fill #1
  Filled 2023-05-03: qty 120, 30d supply, fill #2
  Filled 2023-05-29: qty 120, 30d supply, fill #3
  Filled 2023-07-07: qty 120, 30d supply, fill #4
  Filled 2023-08-14: qty 120, 30d supply, fill #5
  Filled 2023-09-25: qty 120, 30d supply, fill #6
  Filled 2023-11-06: qty 120, 30d supply, fill #7
  Filled 2023-11-30: qty 120, 30d supply, fill #8
  Filled 2024-01-05: qty 120, 30d supply, fill #9
  Filled 2024-02-15: qty 120, 30d supply, fill #10

## 2023-02-22 NOTE — Addendum Note (Signed)
Addended by: Bonney Leitz on: 02/22/2023 04:51 PM   Modules accepted: Orders

## 2023-02-22 NOTE — Patient Instructions (Addendum)
PLEASE START USING OXYGEN WITH WALKING AND EXERTION  PLEASE CONTINUE TO USE OXYGEN AT NIGHT  PLEASE CONTINUE TO USE PULMICORT NEBS TWICE DAILY  START BROVANA NEBS TWICE DAILY  RECOMMEND PULMONARY REHAB REFERRAL  WE WILL ORDER PORTABLE OXYGEN CONCENTRATOR  Avoid secondhand smoke Avoid SICK contacts Recommend  Masking  when appropriate Recommend Keep up-to-date with vaccinations

## 2023-02-23 ENCOUNTER — Other Ambulatory Visit: Payer: Self-pay

## 2023-03-06 DIAGNOSIS — H903 Sensorineural hearing loss, bilateral: Secondary | ICD-10-CM | POA: Diagnosis not present

## 2023-03-23 DIAGNOSIS — H903 Sensorineural hearing loss, bilateral: Secondary | ICD-10-CM | POA: Diagnosis not present

## 2023-03-24 ENCOUNTER — Ambulatory Visit: Payer: Medicare HMO | Admitting: Internal Medicine

## 2023-03-28 ENCOUNTER — Other Ambulatory Visit: Payer: Self-pay

## 2023-05-03 ENCOUNTER — Other Ambulatory Visit: Payer: Self-pay

## 2023-05-04 ENCOUNTER — Other Ambulatory Visit: Payer: Self-pay

## 2023-05-11 ENCOUNTER — Ambulatory Visit: Payer: Medicare (Managed Care) | Admitting: Internal Medicine

## 2023-05-11 ENCOUNTER — Encounter: Payer: Self-pay | Admitting: Internal Medicine

## 2023-05-11 ENCOUNTER — Other Ambulatory Visit: Payer: Self-pay

## 2023-05-11 VITALS — BP 136/70 | HR 93 | Temp 97.6°F | Ht 64.0 in | Wt 141.0 lb

## 2023-05-11 DIAGNOSIS — J4489 Other specified chronic obstructive pulmonary disease: Secondary | ICD-10-CM

## 2023-05-11 DIAGNOSIS — J439 Emphysema, unspecified: Secondary | ICD-10-CM

## 2023-05-11 DIAGNOSIS — R918 Other nonspecific abnormal finding of lung field: Secondary | ICD-10-CM

## 2023-05-11 MED ORDER — BUDESONIDE 0.5 MG/2ML IN SUSP
RESPIRATORY_TRACT | 11 refills | Status: DC
  Filled 2023-05-11 – 2023-05-29 (×2): qty 120, 30d supply, fill #0
  Filled 2023-07-07: qty 120, 30d supply, fill #1
  Filled 2023-08-14: qty 120, 30d supply, fill #2
  Filled 2023-09-25: qty 120, 30d supply, fill #3
  Filled 2023-11-06: qty 120, 30d supply, fill #4
  Filled 2023-11-30: qty 120, 30d supply, fill #5
  Filled 2024-01-05: qty 120, 30d supply, fill #6
  Filled 2024-02-15: qty 120, 30d supply, fill #7
  Filled 2024-03-20: qty 120, 30d supply, fill #8
  Filled 2024-04-23: qty 120, 30d supply, fill #9

## 2023-05-11 NOTE — Patient Instructions (Signed)
Please verify dosing for nebulizer therapy  Continue as prescribed  Recommend CT chest to assess for interval changes  We Will order portable oxygen concentrator from DME We will order Face mask for Nebulizer  Continue Oxygen therapy  Avoid Allergens and Irritants Avoid secondhand smoke Avoid SICK contacts Recommend  Masking  when appropriate Recommend Keep up-to-date with vaccinations

## 2023-05-11 NOTE — Progress Notes (Signed)
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   Neuropathy 02/19/2015   Phlebectasia 02/19/2015   Stasis, venous 02/19/2015   Age related osteoporosis 01/30/2016   History of Graves' disease 01/30/2016   COPD (chronic obstructive pulmonary disease) (HCC) 02/06/2016   Post-nasal drip 05/07/2016   Anxiety, generalized 06/22/2016   Renal cyst, left 11/25/2016   S/P cholecystectomy 11/25/2016   Myalgia due to statin 12/17/2016   NICM (nonischemic cardiomyopathy) (HCC) 05/22/2017   Hypertension associated with type 2 diabetes mellitus (HCC) 06/07/2018   Hyperlipidemia associated with type 2 diabetes mellitus (HCC) 07/12/2020   Acquired hypothyroidism 07/12/2020   Chronic respiratory failure with hypoxia (HCC) 07/31/2020   Aortic atherosclerosis (HCC) 01/14/2021   B12 deficiency 02/12/2021   Tremor of left hand 07/28/2021   History of fall within past 90 days 07/29/2021   COPD exacerbation (HCC) 01/28/2022   Alcohol abuse 01/30/2022   Resolved Ambulatory Problems    Diagnosis Date Noted   Hypercholesterolemia 02/19/2015   Chemical diabetes 02/19/2015   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   Avitaminosis D 02/19/2015   Acute respiratory failure with hypercapnia (HCC) 01/12/2016   Shingles outbreak 01/30/2016   Panlobular emphysema (HCC) 01/30/2016   Essential hypertension 01/30/2016   Hospital discharge follow-up 01/30/2016   Back pain 06/10/2016   COPD with acute exacerbation (HCC) 06/10/2016   Diabetes mellitus without complication (HCC) 07/03/2016   Elevated liver enzymes 11/12/2016   Pancreatic mass 11/25/2016   CAP (community acquired pneumonia) 01/11/2017    Acute respiratory failure with hypoxia (HCC) 01/11/2017   Elevated troponin 01/11/2017   Acute systolic heart failure (HCC)    Nocturnal hypoxia 05/22/2017   Leg swelling 06/13/2017   Close exposure to COVID-19 virus 11/04/2018   Contact dermatitis due to plant 11/03/2019   Past Medical History:  Diagnosis Date   CHF (congestive heart failure) (HCC)    Dental bridge present    Diverticulitis 2015   GERD (gastroesophageal reflux disease)    Hyperlipidemia    PAH (pulmonary artery hypertension) (HCC)    Takotsubo cardiomyopathy    Thyroid disease    Wears hearing aid in both ears      Past Surgical History:   Past Surgical History:  Procedure Laterality Date   CATARACT EXTRACTION W/PHACO Right 10/10/2017   Procedure: CATARACT EXTRACTION PHACO AND INTRAOCULAR LENS PLACEMENT (IOC)  RIGHT;  Surgeon: Lockie Mola, MD;  Location: [facility];  Service: Ophthalmology;  Laterality: Right;   CATARACT EXTRACTION W/PHACO Left 11/15/2017   Procedure: CATARACT EXTRACTION PHACO AND INTRAOCULAR LENS PLACEMENT (IOC)  LEFT;  Surgeon: Lockie Mola, MD;  Location: [facility];  Service: Ophthalmology;  Laterality: Left;   CERVICAL CONE BIOPSY     CHOLECYSTECTOMY     HEMORROIDECTOMY     RIGHT/LEFT HEART CATH AND CORONARY ANGIOGRAPHY N/A 01/16/2017   Procedure: RIGHT/LEFT HEART CATH AND CORONARY ANGIOGRAPHY;  Surgeon: Iran Ouch, MD;  Location: [facility] INVASIVE CV LAB;  Service: Cardiovascular;  Laterality: N/A;         CC Follow-up assessment COPD Follow-up assessment hypoxia Follow-up assessment for abnormal CT chest    Subjective:  Patient seen for follow-up assessment for COPD Patient has severe bilateral emphysematous changes on CT scan  which was reviewed in detail with the patient/DAUGHTER Nebulized therapy has worked well over the last several months Currently on Pulmicort and Brovana nebulizers Dosages to be confirmed by the daughter  No  exacerbation at this time No evidence of heart failure at this time No evidence or signs of infection at this time No respiratory distress No fevers, chills, nausea, vomiting, diarrhea No evidence of lower extremity edema No evidence hemoptysis  Patient continues to wear oxygen as needed and with exertion No acute issues at this time  Assessment of abnormal CT chest There is a left lower lobe lung nodule that is suspicious for malignancy will need to repeat CT chest, these findings were reported to patients daughter and she had agreed with repeating CT chest in 1 month  Physical Exam:  BP 136/70 (BP Location: Left Arm, Patient Position: Sitting, Cuff Size: Normal)   Pulse 93   Temp 97.6 F (36.4 C) (Temporal)   Ht 5\' 4"  (1.626 m)   Wt 141 lb (64 kg)   SpO2 93%   BMI 24.20 kg/m       Review of Systems: Gen:  Denies  fever, sweats, chills weight loss  HEENT: Denies blurred vision, double vision, ear pain, eye pain, hearing loss, nose bleeds, sore throat Cardiac:  No dizziness, chest pain or heaviness, chest tightness,edema, No JVD Resp:   No cough, -sputum production, -shortness of breath,-wheezing, -hemoptysis,  Other:  All other systems negative   Physical Examination:   General Appearance: No distress  EYES PERRLA, EOM intact.   NECK Supple, No JVD Pulmonary: normal breath sounds, No wheezing.  CardiovascularNormal S1,S2.  No m/r/g.   Abdomen: Benign, Soft, non-tender. Neurology UE/LE 5/5 strength, no focal deficits Ext pulses intact, cap refill intact ALL OTHER ROS ARE NEGATIVE    Pulmonary testing:   PFTs (02/04/16): very mild obstruction (FEV1 1.6 liters, 97% pred, FEV1/FVC 61%, flow-vol curve c/w obstruction), Normal lung volumes, mild decrease in DLCO I REVIEWED PFT's IN DETAIL WITH PATIENT 02/22/2023   Chest Imaging:  CXR (01/13/16):  Hyperinflation, NACPD CT chest (01/12/16): severe B upper lobe emphysema, INDEPENDENTLY REVIEWED BY ME TODAY  02/22/2023    CT chest 02/2023 New findings  Sleep Tests:    Cardiac Tests:  ECHO 2018 NL EF, Grade 1 diastolic dysfunction  Social History:  Former smoker-1 pack a day for 40 years  Family History:       Assessment/Plan:   89 year old pleasant white female seen today for follow-up abnormal CT chest consistent with severe bilateral emphysematous changes also with left lower lobe lung nodule in the setting of severe bilateral emphysema with COPD end-stage with chronic shortness of breath and dyspnea exertion with hypoxic respiratory failure and respiratory insufficiency   Respiratory insufficiency Continue oxygen as prescribed No exacerbation at this time No evidence of heart failure at this time No evidence or signs of infection at this time No respiratory distress No fevers, chills, nausea, vomiting, diarrhea No evidence of lower extremity edema No evidence hemoptysis  Severe end-stage COPD Continue nebulized therapy at this time Patient is unable to use traditional inhaler therapy We will need to verify the dosages No indication for antibiotics or prednisone  Hypoxia Continue oxygen as prescribed  Abnormal CT chest Left lower lobe lung nodule High suspicious for cancer Repeat CT chest I discussed the findings with her daughter in November Plan was to repeat CT chest in 1 month    MEDICATION ADJUSTMENTS/LABS AND TESTS ORDERED: Please verify dosing for nebulizer therapy  Continue as prescribed  Recommend CT chest to assess for interval changes  We Will order portable oxygen concentrator from DME We will order Face mask for Nebulizer  Continue Oxygen therapy  Avoid Allergens and Irritants Avoid secondhand smoke Avoid SICK contacts Recommend  Masking  when appropriate Recommend Keep up-to-date with vaccinations   CURRENT MEDICATIONS REVIEWED AT LENGTH WITH PATIENT TODAY   Patient  satisfied with Plan of action and management. All questions  answered  Follow up 6 months   Time Spent Involved in Patient Care on Day of Examination:  42 mins  Raequan Vanschaick Santiago Glad, M.D.  [facility]  Medical Director [facility] Medical Director [facility] Cardio-Pulmonary Department

## 2023-05-23 ENCOUNTER — Other Ambulatory Visit: Payer: Self-pay

## 2023-05-28 ENCOUNTER — Encounter: Payer: Self-pay | Admitting: Internal Medicine

## 2023-05-28 DIAGNOSIS — J439 Emphysema, unspecified: Secondary | ICD-10-CM

## 2023-05-28 DIAGNOSIS — R0689 Other abnormalities of breathing: Secondary | ICD-10-CM

## 2023-05-29 ENCOUNTER — Other Ambulatory Visit: Payer: Self-pay

## 2023-05-29 ENCOUNTER — Other Ambulatory Visit: Payer: Self-pay | Admitting: Internal Medicine

## 2023-05-29 MED ORDER — ESCITALOPRAM OXALATE 20 MG PO TABS
20.0000 mg | ORAL_TABLET | Freq: Every day | ORAL | 1 refills | Status: DC
  Filled 2023-05-29: qty 90, 90d supply, fill #0
  Filled 2023-09-05: qty 90, 90d supply, fill #1

## 2023-05-29 NOTE — Telephone Encounter (Signed)
I have left a message [employer] states the patient didn't get neb machine from them. I have ask if they know which DME company it came from

## 2023-05-29 NOTE — Telephone Encounter (Signed)
Order was placed on 1/30. Please check to see why they have not reached out to her. Thank you!

## 2023-05-29 NOTE — Telephone Encounter (Signed)
Order has been placed. Nothing further needed. 

## 2023-05-29 NOTE — Telephone Encounter (Signed)
I just spoke with Gwynne about the neb machine she stated they have had the machine since before she was started on neb meds. [employer] want give patient the mask and tubing because [employer] didn't give the patient the neb machine. Gwynne stated to just place an order for [employer] and supplies that way her mother will get what she needs

## 2023-05-30 ENCOUNTER — Other Ambulatory Visit: Payer: Self-pay

## 2023-05-30 NOTE — Telephone Encounter (Signed)
I have sent to [employer] and Mary Howe confirmed she has this order

## 2023-06-01 ENCOUNTER — Other Ambulatory Visit: Payer: Self-pay

## 2023-06-13 ENCOUNTER — Ambulatory Visit
Admission: RE | Admit: 2023-06-13 | Discharge: 2023-06-13 | Disposition: A | Discharge status: Home / Self Care | Payer: Medicare (Managed Care) | Source: Ambulatory Visit | Attending: Internal Medicine | Admitting: Internal Medicine

## 2023-06-13 DIAGNOSIS — J439 Emphysema, unspecified: Secondary | ICD-10-CM | POA: Diagnosis present

## 2023-06-13 DIAGNOSIS — R911 Solitary pulmonary nodule: Secondary | ICD-10-CM | POA: Diagnosis not present

## 2023-06-13 DIAGNOSIS — J4489 Other specified chronic obstructive pulmonary disease: Secondary | ICD-10-CM | POA: Diagnosis present

## 2023-06-21 ENCOUNTER — Ambulatory Visit: Payer: Medicare (Managed Care) | Admitting: Internal Medicine

## 2023-06-21 ENCOUNTER — Encounter: Payer: Self-pay | Admitting: Internal Medicine

## 2023-06-21 VITALS — BP 112/60 | HR 84 | Temp 97.6°F | Ht 64.0 in | Wt 143.0 lb

## 2023-06-21 DIAGNOSIS — R911 Solitary pulmonary nodule: Secondary | ICD-10-CM

## 2023-06-21 DIAGNOSIS — J439 Emphysema, unspecified: Secondary | ICD-10-CM | POA: Diagnosis not present

## 2023-06-21 DIAGNOSIS — J449 Chronic obstructive pulmonary disease, unspecified: Secondary | ICD-10-CM

## 2023-06-21 DIAGNOSIS — J9691 Respiratory failure, unspecified with hypoxia: Secondary | ICD-10-CM | POA: Diagnosis not present

## 2023-06-21 DIAGNOSIS — Z87891 Personal history of nicotine dependence: Secondary | ICD-10-CM

## 2023-06-21 NOTE — Progress Notes (Unsigned)
 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   Neuropathy 02/19/2015   Phlebectasia 02/19/2015   Stasis, venous 02/19/2015   Age related osteoporosis 01/30/2016   History of Graves' disease 01/30/2016   COPD (chronic obstructive pulmonary disease) (HCC) 02/06/2016   Post-nasal drip 05/07/2016   Anxiety, generalized 06/22/2016   Renal cyst, left 11/25/2016   S/P cholecystectomy 11/25/2016   Myalgia due to statin 12/17/2016   NICM (nonischemic cardiomyopathy) (HCC) 05/22/2017   Hypertension associated with type 2 diabetes mellitus (HCC) 06/07/2018   Hyperlipidemia associated with type 2 diabetes mellitus (HCC) 07/12/2020   Acquired hypothyroidism 07/12/2020   Chronic respiratory failure with hypoxia (HCC) 07/31/2020   Aortic atherosclerosis (HCC) 01/14/2021   B12 deficiency 02/12/2021   Tremor of left hand 07/28/2021   History of fall within past 90 days 07/29/2021   COPD exacerbation (HCC) 01/28/2022   Alcohol abuse 01/30/2022   Resolved Ambulatory Problems    Diagnosis Date Noted   Hypercholesterolemia 02/19/2015   Chemical diabetes 02/19/2015   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   Avitaminosis D 02/19/2015   Acute respiratory failure with hypercapnia (HCC) 01/12/2016   Shingles outbreak 01/30/2016   Panlobular emphysema (HCC) 01/30/2016   Essential hypertension 01/30/2016   Hospital discharge follow-up 01/30/2016   Back pain 06/10/2016   COPD with acute exacerbation (HCC) 06/10/2016   Diabetes mellitus without complication (HCC) 07/03/2016   Elevated liver enzymes 11/12/2016   Pancreatic mass 11/25/2016   CAP (community acquired pneumonia) 01/11/2017    Acute respiratory failure with hypoxia (HCC) 01/11/2017   Elevated troponin 01/11/2017   Acute systolic heart failure (HCC)    Nocturnal hypoxia 05/22/2017   Leg swelling 06/13/2017   Close exposure to COVID-19 virus 11/04/2018   Contact dermatitis due to plant 11/03/2019   Past Medical History:  Diagnosis Date   CHF (congestive heart failure) (HCC)    Dental bridge present    Diverticulitis 2015   GERD (gastroesophageal reflux disease)    Hyperlipidemia    PAH (pulmonary artery hypertension) (HCC)    Takotsubo cardiomyopathy    Thyroid disease    Wears hearing aid in both ears      Past Surgical History:   Past Surgical History:  Procedure Laterality Date   CATARACT EXTRACTION W/PHACO Right 10/10/2017   Procedure: CATARACT EXTRACTION PHACO AND INTRAOCULAR LENS PLACEMENT (IOC)  RIGHT;  Surgeon: Lockie Mola, MD;  Location: [facility];  Service: Ophthalmology;  Laterality: Right;   CATARACT EXTRACTION W/PHACO Left 11/15/2017   Procedure: CATARACT EXTRACTION PHACO AND INTRAOCULAR LENS PLACEMENT (IOC)  LEFT;  Surgeon: Lockie Mola, MD;  Location: [facility];  Service: Ophthalmology;  Laterality: Left;   CERVICAL CONE BIOPSY     CHOLECYSTECTOMY     HEMORROIDECTOMY     RIGHT/LEFT HEART CATH AND CORONARY ANGIOGRAPHY N/A 01/16/2017   Procedure: RIGHT/LEFT HEART CATH AND CORONARY ANGIOGRAPHY;  Surgeon: Iran Ouch, MD;  Location: [facility] INVASIVE CV LAB;  Service: Cardiovascular;  Laterality: N/A;         CC Follow-up assessment for COPD Follow-up assessment for chronic hypoxic respiratory failure Follow-up assessment for abnormal CT chest lung nodule    Subjective:  Follow-up assessment for COPD Bilateral emphysematous changes on CT  scan Nebulized therapy has worked well over the last several months Continue Pulmicort and Brovana nebulizers   No exacerbation at this time No evidence of heart failure at this time No evidence or signs of  infection at this time No respiratory distress No fevers, chills, nausea, vomiting, diarrhea No evidence of lower extremity edema No evidence hemoptysis  Continues to wear oxygen as needed No acute issues at this time   Abnormal CT chest Severe bilateral emphysematous changes Most current CT scan March 2025 there is a left lower lobe lung nodule that is suspicious for lung malignancy however it has not increased in size but according to what I can tell over the last 3 months, we have discussed about waiting for the official radiology reports before deciding on plan of action   Physical Exam:  BP 112/60 (BP Location: Left Arm, Patient Position: Sitting, Cuff Size: Normal)   Pulse 84   Temp 97.6 F (36.4 C) (Temporal)   Ht 5\' 4"  (1.626 m)   Wt 143 lb (64.9 kg)   BMI 24.55 kg/m     Review of Systems: Gen:  Denies  fever, sweats, chills weight loss  HEENT: Denies blurred vision, double vision, ear pain, eye pain, hearing loss, nose bleeds, sore throat Cardiac:  No dizziness, chest pain or heaviness, chest tightness,edema, No JVD Resp:   No cough, -sputum production, -shortness of breath,-wheezing, -hemoptysis,  Other:  All other systems negative   Physical Examination:   General Appearance: No distress  EYES PERRLA, EOM intact.   NECK Supple, No JVD Pulmonary: normal breath sounds, No wheezing.  CardiovascularNormal S1,S2.  No m/r/g.   Abdomen: Benign, Soft, non-tender. Neurology UE/LE 5/5 strength, no focal deficits Ext pulses intact, cap refill intact ALL OTHER ROS ARE NEGATIVE    Pulmonary testing:   PFTs (02/04/16): very mild obstruction (FEV1 1.6 liters, 97% pred, FEV1/FVC 61%, flow-vol curve c/w obstruction), Normal lung volumes, mild decrease in DLCO I REVIEWED PFT's IN DETAIL WITH PATIENT 02/22/2023   Chest Imaging:  CXR (01/13/16):  Hyperinflation, NACPD CT chest (01/12/16): severe B upper lobe emphysema, INDEPENDENTLY REVIEWED BY ME TODAY  02/22/2023    CT chest 02/2023 New findings left lower lobe lung nodule CT chest March 2025 left lower lobe lung nodule still persistent, awaiting final report from radiology to determine interval changes in size Sleep Tests:    Cardiac Tests:  ECHO 2018 NL EF, Grade 1 diastolic dysfunction  Social History:  Former smoker-1 pack a day for 40 years  Family History:       Assessment/Plan:   89 year old pleasant white female seen today for follow-up abnormal CT chest consistent with severe bilateral emphysematous changes also with left lower lobe lung nodule in the setting of severe bilateral emphysema with COPD end-stage with chronic shortness of breath and dyspnea exertion with hypoxic respiratory failure and respiratory insufficiency   Respiratory insufficiency Continue oxygen as prescribed No exacerbation at this time No evidence of heart failure at this time No evidence or signs of infection at this time No respiratory distress No fevers, chills, nausea, vomiting, diarrhea No evidence of lower extremity edema No evidence hemoptysis  Severe end-stage COPD Continue nebulized therapy at this time Patient is unable to use traditional inhaler therapy We will need to verify the dosages No indication for antibiotics or prednisone  Hypoxia Continue oxygen as prescribed  Abnormal CT chest Left lower lobe lung nodule High suspicious for cancer Repeat CT chest I discussed the findings with her daughter regarding  CT scan results from March 4 There is no official radiology read at this time however based on my assessment and findings I do not think there is significant difference in size We will plan accordingly based on radiology report Patient and her daughter are satisfied with this plan of action     MEDICATION ADJUSTMENTS/LABS AND TESTS ORDERED: Recommend CT chest to assess for interval changes based on radiology reports Continue Oxygen therapy Continue nebulized  therapy as prescribed Avoid Allergens and Irritants Avoid secondhand smoke Avoid SICK contacts Recommend  Masking  when appropriate Recommend Keep up-to-date with vaccinations   CURRENT MEDICATIONS REVIEWED AT LENGTH WITH PATIENT TODAY   Patient  satisfied with Plan of action and management. All questions answered  Follow up 3 months   Time Spent Involved in Patient Care on Day of Examination: 44 mins    Angas Isabell Santiago Glad, M.D.  [facility]  Medical Director [facility] Medical Director [facility] Cardio-Pulmonary Department

## 2023-06-21 NOTE — Patient Instructions (Signed)
 Lets plan to wait for official radiology report of the March 2025 CT scan We will then treat plan of action based on findings  Continue nebulizers as prescribed Continue oxygen as prescribed  Avoid Allergens and Irritants Avoid secondhand smoke Avoid SICK contacts Recommend  Masking  when appropriate Recommend Keep up-to-date with vaccinations

## 2023-07-07 ENCOUNTER — Other Ambulatory Visit: Payer: Self-pay | Admitting: Internal Medicine

## 2023-07-07 ENCOUNTER — Other Ambulatory Visit: Payer: Self-pay

## 2023-07-07 DIAGNOSIS — R911 Solitary pulmonary nodule: Secondary | ICD-10-CM

## 2023-07-07 NOTE — Progress Notes (Unsigned)
 CT chest report reviewed Will need follow up CT chest in 3 months for NEW RLL nodule

## 2023-08-14 ENCOUNTER — Other Ambulatory Visit: Payer: Self-pay

## 2023-09-05 ENCOUNTER — Other Ambulatory Visit: Payer: Self-pay

## 2023-09-05 ENCOUNTER — Other Ambulatory Visit: Payer: Self-pay | Admitting: Cardiovascular Disease

## 2023-09-05 ENCOUNTER — Other Ambulatory Visit: Payer: Self-pay | Admitting: Internal Medicine

## 2023-09-05 DIAGNOSIS — K219 Gastro-esophageal reflux disease without esophagitis: Secondary | ICD-10-CM

## 2023-09-05 MED ORDER — PANTOPRAZOLE SODIUM 40 MG PO TBEC
40.0000 mg | DELAYED_RELEASE_TABLET | Freq: Every day | ORAL | 1 refills | Status: DC
  Filled 2023-09-05: qty 90, 90d supply, fill #0

## 2023-09-05 MED ORDER — LEVOTHYROXINE SODIUM 50 MCG PO TABS
50.0000 ug | ORAL_TABLET | Freq: Every day | ORAL | 1 refills | Status: DC
Start: 2023-09-05 — End: 2024-01-15
  Filled 2023-09-05: qty 90, 90d supply, fill #0

## 2023-09-05 MED ORDER — BISOPROLOL FUMARATE 5 MG PO TABS
2.5000 mg | ORAL_TABLET | Freq: Every day | ORAL | 1 refills | Status: DC
Start: 2023-09-05 — End: 2024-01-15
  Filled 2023-09-05: qty 45, 90d supply, fill #0

## 2023-09-06 ENCOUNTER — Other Ambulatory Visit: Payer: Self-pay

## 2023-09-06 MED ORDER — FUROSEMIDE 20 MG PO TABS
20.0000 mg | ORAL_TABLET | Freq: Every day | ORAL | 2 refills | Status: AC
  Filled 2023-09-06: qty 90, 90d supply, fill #0
  Filled 2024-02-15: qty 90, 45d supply, fill #0

## 2023-09-18 ENCOUNTER — Other Ambulatory Visit: Payer: Self-pay

## 2023-09-25 ENCOUNTER — Other Ambulatory Visit: Payer: Self-pay

## 2023-09-27 ENCOUNTER — Other Ambulatory Visit: Payer: Self-pay

## 2023-09-27 ENCOUNTER — Encounter: Payer: Self-pay | Admitting: Internal Medicine

## 2023-09-27 ENCOUNTER — Ambulatory Visit: Payer: Medicare (Managed Care) | Admitting: Internal Medicine

## 2023-09-27 VITALS — BP 112/70 | HR 75 | Ht 64.0 in | Wt 142.0 lb

## 2023-09-27 DIAGNOSIS — E1169 Type 2 diabetes mellitus with other specified complication: Secondary | ICD-10-CM

## 2023-09-27 DIAGNOSIS — E039 Hypothyroidism, unspecified: Secondary | ICD-10-CM

## 2023-09-27 DIAGNOSIS — I152 Hypertension secondary to endocrine disorders: Secondary | ICD-10-CM

## 2023-09-27 DIAGNOSIS — E1159 Type 2 diabetes mellitus with other circulatory complications: Secondary | ICD-10-CM

## 2023-09-27 DIAGNOSIS — R4189 Other symptoms and signs involving cognitive functions and awareness: Secondary | ICD-10-CM | POA: Diagnosis not present

## 2023-09-27 DIAGNOSIS — Z79899 Other long term (current) drug therapy: Secondary | ICD-10-CM

## 2023-09-27 DIAGNOSIS — E785 Hyperlipidemia, unspecified: Secondary | ICD-10-CM

## 2023-09-27 DIAGNOSIS — E538 Deficiency of other specified B group vitamins: Secondary | ICD-10-CM

## 2023-09-27 MED ORDER — ESCITALOPRAM OXALATE 20 MG PO TABS
20.0000 mg | ORAL_TABLET | Freq: Every day | ORAL | 1 refills | Status: DC
  Filled 2023-09-27: qty 90, 90d supply, fill #0

## 2023-09-27 NOTE — Progress Notes (Signed)
 Subjective:  Patient ID: Mary Howe, female    DOB: 06-Feb-1933  Age: 89 y.o. MRN: [id number]  CC: The primary encounter diagnosis was Hypertension associated with type 2 diabetes mellitus (HCC). Diagnoses of Hyperlipidemia associated with type 2 diabetes mellitus (HCC), Acquired hypothyroidism, and Long-term use of high-risk medication were also pertinent to this visit.   HPI Mary Howe presents for  Chief Complaint  Patient presents with   memory issues   Daughter present.  Repeating herself often .  Left the water running in the bathroom one night but states that it was because she is hard of hearing.  No kitchen mishaps..  no waking up confused. Has new hearing aid but not helping. .   Now earing supplemental oxygen  24/7 per pulmonology and Got  a portable oxygen  concentrator this week,  'it's loud  Patient reports increased stress having to live with Georgianna Kirschner who is a know it all    Outpatient Medications Prior to Visit  Medication Sig Dispense Refill   acetaminophen  (TYLENOL ) 500 MG tablet Take 500 mg by mouth every 6 (six) hours as needed.     arformoterol  (BROVANA ) 15 MCG/2ML NEBU Inhale 2 mLs (15 mcg total) by nebulization 2 (two) times daily. 120 mL 10   atorvastatin  (LIPITOR) 20 MG tablet Take 1 tablet (20 mg total) by mouth every other day. 45 tablet 3   bisoprolol  (ZEBETA ) 5 MG tablet Take 0.5 tablets (2.5 mg total) by mouth daily. 45 tablet 1   budesonide  (PULMICORT ) 0.5 MG/2ML nebulizer solution USE 1 VIAL VIA NEBULIZER TWICE DAILY 120 mL 11   calcium  carbonate (OS-CAL) 600 MG TABS tablet Take 600 mg by mouth daily with breakfast.      cetirizine  (ZYRTEC ) 10 MG tablet Take 1 tablet (10 mg total) by mouth daily. 90 tablet 1   escitalopram  (LEXAPRO ) 20 MG tablet Take 1 tablet (20 mg total) by mouth daily. 90 tablet 1   furosemide  (LASIX ) 20 MG tablet Take 1 tablet (20 mg total) by mouth daily. TAKE 1 EXTRA TABLET EVERY EVENING FOR SWELLING. 90 tablet 2   levothyroxine   (SYNTHROID ) 50 MCG tablet Take 1 tablet (50 mcg total) by mouth daily at 6 (six) AM. 90 tablet 1   MULTIPLE VITAMIN PO Take 1 tablet by mouth daily.      OXYGEN  Inhale 2.5 L daily into the lungs.     pantoprazole  (PROTONIX ) 40 MG tablet Take 1 tablet (40 mg total) by mouth daily. 90 tablet 1   No facility-administered medications prior to visit.    Review of Systems;  Patient denies headache, fevers, malaise, unintentional weight loss, skin rash, eye pain, sinus congestion and sinus pain, sore throat, dysphagia,  hemoptysis , cough, dyspnea, wheezing, chest pain, palpitations, orthopnea, edema, abdominal pain, nausea, melena, diarrhea, constipation, flank pain, dysuria, hematuria, urinary  Frequency, nocturia, numbness, tingling, seizures,  Focal weakness, Loss of consciousness,  Tremor, insomnia, depression, anxiety, and suicidal ideation.      Objective:  BP 112/70   Pulse 75   Ht 5' 4 (1.626 m)   Wt 142 lb (64.4 kg)   SpO2 93%   BMI 24.37 kg/m   BP Readings from Last 3 Encounters:  09/27/23 112/70  06/21/23 112/60  05/11/23 136/70    Wt Readings from Last 3 Encounters:  09/27/23 142 lb (64.4 kg)  06/21/23 143 lb (64.9 kg)  05/11/23 141 lb (64 kg)    Physical Exam  Lab Results  Component Value Date   HGBA1C 5.8 (  H) 01/13/2023   HGBA1C 5.8 (H) 01/28/2022   HGBA1C 5.8 07/28/2021    Lab Results  Component Value Date   CREATININE 0.62 01/13/2023   CREATININE 0.60 01/28/2022   CREATININE 0.58 12/10/2021    Lab Results  Component Value Date   WBC 8.0 01/28/2022   HGB 13.5 01/28/2022   HCT 39.5 01/28/2022   PLT 213 01/28/2022   GLUCOSE 139 (H) 01/13/2023   CHOL 195 01/13/2023   TRIG 83 01/13/2023   HDL 113 01/13/2023   LDLDIRECT 72 01/13/2023   LDLCALC 65 01/13/2023   ALT 23 01/13/2023   AST 27 01/13/2023   NA 137 01/13/2023   K 4.7 01/13/2023   CL 98 01/13/2023   CREATININE 0.62 01/13/2023   BUN 6 (L) 01/13/2023   CO2 25 01/13/2023   TSH 1.43  01/13/2023   INR 1.09 01/16/2017   HGBA1C 5.8 (H) 01/13/2023   MICROALBUR 0.4 01/13/2023    CT CHEST WO CONTRAST Result Date: 06/28/2023 CLINICAL DATA:  Pulmonary nodule follow-up EXAM: CT CHEST WITHOUT CONTRAST TECHNIQUE: Multidetector CT imaging of the chest was performed following the standard protocol without IV contrast. RADIATION DOSE REDUCTION: This exam was performed according to the departmental dose-optimization program which includes automated exposure control, adjustment of the mA and/or kV according to patient size and/or use of iterative reconstruction technique. COMPARISON:  February 22, 2023 FINDINGS: Cardiovascular: No significant vascular findings. Normal heart size. No pericardial effusion. Mild coronary artery calcifications. No pericardial effusions. Ascending aorta unremarkable Mediastinum/Nodes: No enlarged mediastinal or axillary lymph nodes. Thyroid  gland, trachea, and esophagus demonstrate no significant findings. Previously described possible pericardial cyst clearly identified on today's images. Lungs/Pleura: Extensive centrilobular and paraseptal emphysematous changes stable since prior examination. Bilobed density in the peripheral portion of the right lower lobe image 71 measuring 10 mm in maximum diameter. New since prior examination. 10 x 9 mm pleural-based nodular density in the left lower lobe unchanged since prior examination. Upper Abdomen: No acute abnormality. Musculoskeletal: No chest wall mass or suspicious bone lesions identified. IMPRESSION: *Extensive centrilobular and paraseptal emphysematous changes stable since prior examination. *Bilobed density in the peripheral portion of the right lower lobe measuring 10 mm in maximum diameter. New since prior examination. Lung-RADS 3, probably benign findings. Short-term follow-up in 6 months is recommended with repeat low-dose chest CT without contrast (please use the following order, CT CHEST LCS NODULE FOLLOW-UP W/O  CM). *10 x 9 mm pleural-based nodular density in the left lower lobe unchanged since prior examination. Electronically Signed   By: Fredrich Jefferson M.D.   On: 06/28/2023 18:42    Assessment & Plan:  .Hypertension associated with type 2 diabetes mellitus (HCC)  Hyperlipidemia associated with type 2 diabetes mellitus (HCC)  Acquired hypothyroidism  Long-term use of high-risk medication     I spent 34 minutes on the day of this face to face encounter reviewing patient's  most recent visit with cardiology,  nephrology,  and neurology,  prior relevant surgical and non surgical procedures, recent  labs and imaging studies, counseling on weight management,  reviewing the assessment and plan with patient, and post visit ordering and reviewing of  diagnostics and therapeutics with patient  .   Follow-up: No follow-ups on file.   Thersia Flax, MD

## 2023-09-28 ENCOUNTER — Ambulatory Visit: Payer: Self-pay | Admitting: Internal Medicine

## 2023-09-28 DIAGNOSIS — E538 Deficiency of other specified B group vitamins: Secondary | ICD-10-CM | POA: Insufficient documentation

## 2023-09-28 DIAGNOSIS — R4189 Other symptoms and signs involving cognitive functions and awareness: Secondary | ICD-10-CM | POA: Insufficient documentation

## 2023-09-28 LAB — CBC WITH DIFFERENTIAL/PLATELET
Basophils Absolute: 0.1 10*3/uL (ref 0.0–0.1)
Basophils Relative: 1.4 % (ref 0.0–3.0)
Eosinophils Absolute: 0 10*3/uL (ref 0.0–0.7)
Eosinophils Relative: 0.4 % (ref 0.0–5.0)
HCT: 41.7 % (ref 36.0–46.0)
Hemoglobin: 13.9 g/dL (ref 12.0–15.0)
Lymphocytes Relative: 20.2 % (ref 12.0–46.0)
Lymphs Abs: 1.1 10*3/uL (ref 0.7–4.0)
MCHC: 33.3 g/dL (ref 30.0–36.0)
MCV: 97.5 fl (ref 78.0–100.0)
Monocytes Absolute: 0.5 10*3/uL (ref 0.1–1.0)
Monocytes Relative: 9.2 % (ref 3.0–12.0)
Neutro Abs: 3.6 10*3/uL (ref 1.4–7.7)
Neutrophils Relative %: 68.8 % (ref 43.0–77.0)
Platelets: 234 10*3/uL (ref 150.0–400.0)
RBC: 4.28 Mil/uL (ref 3.87–5.11)
RDW: 14.3 % (ref 11.5–15.5)
WBC: 5.3 10*3/uL (ref 4.0–10.5)

## 2023-09-28 LAB — COMPREHENSIVE METABOLIC PANEL WITH GFR
ALT: 18 U/L (ref 0–35)
AST: 23 U/L (ref 0–37)
Albumin: 4.2 g/dL (ref 3.5–5.2)
Alkaline Phosphatase: 53 U/L (ref 39–117)
BUN: 9 mg/dL (ref 6–23)
CO2: 29 meq/L (ref 19–32)
Calcium: 9.6 mg/dL (ref 8.4–10.5)
Chloride: 100 meq/L (ref 96–112)
Creatinine, Ser: 0.68 mg/dL (ref 0.40–1.20)
GFR: 77.42 mL/min (ref >60.00)
Glucose, Bld: 96 mg/dL (ref 70–99)
Potassium: 4 meq/L (ref 3.5–5.1)
Sodium: 137 meq/L (ref 135–145)
Total Bilirubin: 0.6 mg/dL (ref 0.2–1.2)
Total Protein: 6.8 g/dL (ref 6.0–8.3)

## 2023-09-28 LAB — LIPID PANEL
Cholesterol: 218 mg/dL — ABNORMAL HIGH (ref 0–200)
HDL: 113.5 mg/dL (ref >39.00)
LDL Cholesterol: 88 mg/dL (ref 0–99)
NonHDL: 104.2
Total CHOL/HDL Ratio: 2
Triglycerides: 81 mg/dL (ref 0.0–149.0)
VLDL: 16.2 mg/dL (ref 0.0–40.0)

## 2023-09-28 LAB — B12 AND FOLATE PANEL
Folate: 5.5 ng/mL — ABNORMAL LOW (ref >5.9)
Vitamin B-12: 230 pg/mL (ref 211–911)

## 2023-09-28 LAB — HEMOGLOBIN A1C: Hgb A1c MFr Bld: 5.7 % (ref 4.6–6.5)

## 2023-09-28 LAB — TSH: TSH: 2.33 u[IU]/mL (ref 0.35–5.50)

## 2023-09-28 LAB — RPR: RPR Ser Ql: NONREACTIVE

## 2023-09-28 LAB — LDL CHOLESTEROL, DIRECT: Direct LDL: 89 mg/dL

## 2023-09-28 MED ORDER — FOLIC ACID 1 MG PO TABS
1.0000 mg | ORAL_TABLET | Freq: Every day | ORAL | 3 refills | Status: AC
  Filled 2023-09-28: qty 90, 90d supply, fill #0
  Filled 2024-01-05: qty 90, 90d supply, fill #1
  Filled 2024-04-23: qty 90, 90d supply, fill #2

## 2023-09-28 NOTE — Assessment & Plan Note (Addendum)
 Treated with MI injections initially,  but low again which may be due to mediation non adherence .  Will resume parenteral injections  Lab Results  Component Value Date   VITAMINB12 230 09/27/2023

## 2023-09-28 NOTE — Assessment & Plan Note (Signed)
 Likely secondary to alcohol abuse.  Wll replace

## 2023-09-28 NOTE — Assessment & Plan Note (Signed)
 Multifactorial etiology including alcohol abuse and hearing loss .  Screening for reversible causes today includes low b12  and  low folate  Lab Results  Component Value Date   VITAMINB12 230 09/27/2023   Lab Results  Component Value Date   FOLATE 5.5 (L) 09/27/2023   No results found for: RPR Lab Results  Component Value Date   TSH 2.33 09/27/2023

## 2023-09-29 ENCOUNTER — Other Ambulatory Visit: Payer: Self-pay

## 2023-10-02 ENCOUNTER — Ambulatory Visit (INDEPENDENT_AMBULATORY_CARE_PROVIDER_SITE_OTHER): Payer: Medicare (Managed Care)

## 2023-10-02 DIAGNOSIS — E538 Deficiency of other specified B group vitamins: Secondary | ICD-10-CM

## 2023-10-02 MED ORDER — CYANOCOBALAMIN 1000 MCG/ML IJ SOLN
1000.0000 ug | Freq: Once | INTRAMUSCULAR | Status: AC
Start: 2023-10-02 — End: 2023-10-02
  Administered 2023-10-02: 1000 ug via INTRAMUSCULAR

## 2023-10-02 NOTE — Telephone Encounter (Signed)
 I have scheduled pt for a b12 injection here in the office today at 4pm. Daughter would like to know how often pt will need to do the b12 injections. Will it be the 4 weekly injections and then once a month?

## 2023-10-02 NOTE — Progress Notes (Signed)
 Patient presented for B 12 injection to left deltoid, patient voiced no concerns nor showed any signs of distress during injection.

## 2023-10-09 ENCOUNTER — Ambulatory Visit: Payer: Medicare (Managed Care)

## 2023-10-09 DIAGNOSIS — E538 Deficiency of other specified B group vitamins: Secondary | ICD-10-CM | POA: Diagnosis not present

## 2023-10-09 MED ORDER — CYANOCOBALAMIN 1000 MCG/ML IJ SOLN
1000.0000 ug | Freq: Once | INTRAMUSCULAR | Status: AC
  Administered 2023-10-09: 1000 ug via INTRAMUSCULAR

## 2023-10-09 NOTE — Progress Notes (Signed)
 Pt presented for their vitamin B12 injection. Pt was identified through two identifiers. Pt tolerated shot well in their left deltoid.

## 2023-10-16 ENCOUNTER — Ambulatory Visit (INDEPENDENT_AMBULATORY_CARE_PROVIDER_SITE_OTHER): Payer: Medicare (Managed Care)

## 2023-10-16 DIAGNOSIS — E538 Deficiency of other specified B group vitamins: Secondary | ICD-10-CM

## 2023-10-16 MED ORDER — CYANOCOBALAMIN 1000 MCG/ML IJ SOLN
1000.0000 ug | Freq: Once | INTRAMUSCULAR | Status: AC
  Administered 2023-10-16: 1000 ug via INTRAMUSCULAR

## 2023-10-16 NOTE — Progress Notes (Signed)
 Patient is in office today for a nurse visit for B12 Injection. Patient Injection was given in the  Right arm. Patient tolerated injection well.

## 2023-10-23 ENCOUNTER — Ambulatory Visit: Payer: Medicare (Managed Care)

## 2023-10-23 DIAGNOSIS — E538 Deficiency of other specified B group vitamins: Secondary | ICD-10-CM

## 2023-10-23 MED ORDER — CYANOCOBALAMIN 1000 MCG/ML IJ SOLN
1000.0000 ug | Freq: Once | INTRAMUSCULAR | Status: AC
  Administered 2023-10-23: 1000 ug via INTRAMUSCULAR

## 2023-10-23 NOTE — Progress Notes (Signed)
 After obtaining consent, and per orders of Dr.Tullo, MD injection of B-12 injection given IM in L Deltoid by Doyce Croak, CMA. Patient tolerated injection well.

## 2023-11-06 ENCOUNTER — Other Ambulatory Visit: Payer: Self-pay

## 2023-11-20 ENCOUNTER — Ambulatory Visit (INDEPENDENT_AMBULATORY_CARE_PROVIDER_SITE_OTHER): Payer: Medicare (Managed Care)

## 2023-11-20 DIAGNOSIS — E538 Deficiency of other specified B group vitamins: Secondary | ICD-10-CM | POA: Diagnosis not present

## 2023-11-20 MED ORDER — CYANOCOBALAMIN 1000 MCG/ML IJ SOLN
1000.0000 ug | Freq: Once | INTRAMUSCULAR | Status: AC
  Administered 2023-11-20 (×2): 1000 ug via INTRAMUSCULAR

## 2023-11-20 NOTE — Progress Notes (Signed)
 Pt presented for their vitamin B12 injection. Pt was identified through two identifiers. Pt tolerated shot well in their right deltoid.

## 2023-11-30 ENCOUNTER — Other Ambulatory Visit: Payer: Self-pay

## 2023-12-06 ENCOUNTER — Encounter: Payer: Self-pay | Admitting: *Deleted

## 2023-12-12 ENCOUNTER — Ambulatory Visit: Payer: Medicare HMO

## 2023-12-21 ENCOUNTER — Ambulatory Visit: Payer: Medicare (Managed Care)

## 2023-12-25 ENCOUNTER — Ambulatory Visit: Payer: Medicare (Managed Care)

## 2023-12-25 DIAGNOSIS — E538 Deficiency of other specified B group vitamins: Secondary | ICD-10-CM

## 2023-12-25 DIAGNOSIS — Z23 Encounter for immunization: Secondary | ICD-10-CM | POA: Diagnosis not present

## 2023-12-25 MED ORDER — CYANOCOBALAMIN 1000 MCG/ML IJ SOLN
1000.0000 ug | Freq: Once | INTRAMUSCULAR | Status: AC
  Administered 2023-12-25: 1000 ug via INTRAMUSCULAR

## 2023-12-25 NOTE — Progress Notes (Signed)
 Pt presented for their vitamin B12 injection. Pt was identified through two identifiers. Pt tolerated shot well in their left deltoid.   Patient is in office today for a nurse visit for High Dose Flu Immunization. Patient Injection was given in the  Right deltoid. Patient tolerated injection well.

## 2024-01-05 ENCOUNTER — Other Ambulatory Visit: Payer: Self-pay

## 2024-01-15 ENCOUNTER — Other Ambulatory Visit: Payer: Self-pay

## 2024-01-15 ENCOUNTER — Ambulatory Visit: Payer: Medicare (Managed Care) | Admitting: Internal Medicine

## 2024-01-15 ENCOUNTER — Other Ambulatory Visit: Payer: Self-pay | Admitting: Internal Medicine

## 2024-01-15 ENCOUNTER — Ambulatory Visit
Admission: RE | Admit: 2024-01-15 | Discharge: 2024-01-15 | Disposition: A | Discharge status: Home / Self Care | Payer: Medicare (Managed Care) | Source: Ambulatory Visit | Attending: Internal Medicine | Admitting: Internal Medicine

## 2024-01-15 ENCOUNTER — Encounter: Payer: Self-pay | Admitting: Internal Medicine

## 2024-01-15 VITALS — BP 136/66 | HR 81 | Ht 64.0 in | Wt 137.6 lb

## 2024-01-15 DIAGNOSIS — E538 Deficiency of other specified B group vitamins: Secondary | ICD-10-CM | POA: Diagnosis not present

## 2024-01-15 DIAGNOSIS — K219 Gastro-esophageal reflux disease without esophagitis: Secondary | ICD-10-CM

## 2024-01-15 DIAGNOSIS — E1159 Type 2 diabetes mellitus with other circulatory complications: Secondary | ICD-10-CM

## 2024-01-15 DIAGNOSIS — I428 Other cardiomyopathies: Secondary | ICD-10-CM | POA: Diagnosis not present

## 2024-01-15 DIAGNOSIS — I10 Essential (primary) hypertension: Secondary | ICD-10-CM

## 2024-01-15 DIAGNOSIS — R918 Other nonspecific abnormal finding of lung field: Secondary | ICD-10-CM

## 2024-01-15 DIAGNOSIS — R4189 Other symptoms and signs involving cognitive functions and awareness: Secondary | ICD-10-CM

## 2024-01-15 DIAGNOSIS — J431 Panlobular emphysema: Secondary | ICD-10-CM

## 2024-01-15 DIAGNOSIS — I152 Hypertension secondary to endocrine disorders: Secondary | ICD-10-CM

## 2024-01-15 DIAGNOSIS — E039 Hypothyroidism, unspecified: Secondary | ICD-10-CM

## 2024-01-15 DIAGNOSIS — Z1231 Encounter for screening mammogram for malignant neoplasm of breast: Secondary | ICD-10-CM

## 2024-01-15 LAB — COMPREHENSIVE METABOLIC PANEL WITH GFR
ALT: 21 U/L (ref 0–35)
AST: 27 U/L (ref 0–37)
Albumin: 4.4 g/dL (ref 3.5–5.2)
Alkaline Phosphatase: 58 U/L (ref 39–117)
BUN: 9 mg/dL (ref 6–23)
CO2: 27 meq/L (ref 19–32)
Calcium: 9.5 mg/dL (ref 8.4–10.5)
Chloride: 98 meq/L (ref 96–112)
Creatinine, Ser: 0.62 mg/dL (ref 0.40–1.20)
GFR: 78.99 mL/min (ref >60.00)
Glucose, Bld: 118 mg/dL — ABNORMAL HIGH (ref 70–99)
Potassium: 4 meq/L (ref 3.5–5.1)
Sodium: 139 meq/L (ref 135–145)
Total Bilirubin: 0.9 mg/dL (ref 0.2–1.2)
Total Protein: 7.3 g/dL (ref 6.0–8.3)

## 2024-01-15 LAB — B12 AND FOLATE PANEL
Folate: >22.9 ng/mL (ref >5.9)
Vitamin B-12: 542 pg/mL (ref 211–911)

## 2024-01-15 MED ORDER — ESCITALOPRAM OXALATE 20 MG PO TABS
20.0000 mg | ORAL_TABLET | Freq: Every day | ORAL | 1 refills | Status: AC
  Filled 2024-01-15: qty 90, 90d supply, fill #0
  Filled 2024-05-10: qty 90, 90d supply, fill #1

## 2024-01-15 MED ORDER — BISOPROLOL FUMARATE 5 MG PO TABS
2.5000 mg | ORAL_TABLET | Freq: Every day | ORAL | 1 refills | Status: DC
  Filled 2024-01-15: qty 45, 90d supply, fill #0
  Filled 2024-04-23: qty 45, 90d supply, fill #1

## 2024-01-15 MED ORDER — LEVOTHYROXINE SODIUM 50 MCG PO TABS
50.0000 ug | ORAL_TABLET | Freq: Every day | ORAL | 1 refills | Status: AC
  Filled 2024-01-15: qty 90, 90d supply, fill #0
  Filled 2024-05-10: qty 90, 90d supply, fill #1

## 2024-01-15 MED ORDER — ATORVASTATIN CALCIUM 20 MG PO TABS
20.0000 mg | ORAL_TABLET | ORAL | 3 refills | Status: AC
  Filled 2024-01-15: qty 45, 90d supply, fill #0
  Filled 2024-05-10: qty 45, 90d supply, fill #1

## 2024-01-15 MED ORDER — PANTOPRAZOLE SODIUM 40 MG PO TBEC
40.0000 mg | DELAYED_RELEASE_TABLET | Freq: Every day | ORAL | 1 refills | Status: AC
  Filled 2024-01-15: qty 90, 90d supply, fill #0
  Filled 2024-05-10: qty 90, 90d supply, fill #1

## 2024-01-15 NOTE — Assessment & Plan Note (Signed)
Secondary to pulmonary hypertension and diastolic dysfunction.  Continue BP control ,  noted home bps  < 130/80 with bisoprolol and prn furosemide ? ?. ?

## 2024-01-15 NOTE — Assessment & Plan Note (Signed)
 Her diabetes remains  well-controlled on diet alone .  hemoglobin A1c has been consistently at or  less than 7.0 . Patient is up-to-date on eye exams and foot exam is normal today. Patient has no microalbuminuria. Patient is intolerant of statin therapy  secondary to statin myalgia with previous trial of simvastatin ; however her HDL:LDL  ratio is 1.   Lab Results  Component Value Date   HGBA1C 5.7 09/27/2023    Lab Results  Component Value Date   MICROALBUR 0.4 01/13/2023     Lab Results  Component Value Date   CHOL 218 (H) 09/27/2023   HDL 113.50 09/27/2023   LDLCALC 88 09/27/2023   LDLDIRECT 89.0 09/27/2023   TRIG 81.0 09/27/2023   CHOLHDL 2 09/27/2023

## 2024-01-15 NOTE — Patient Instructions (Addendum)
 You are due for a repeat CT scan of your chest to follow up on a nodule in the right lower lobe that was seen in march  [facility] will be calling you today  I recommend that you get the RSV vaccine at your pharmacy

## 2024-01-15 NOTE — Assessment & Plan Note (Signed)
 Managed with low dose levothyroxine ,  But has been skipping a dose weekly due to concurrent use of alendronate  .  Repeat level is due   Lab Results  Component Value Date   TSH 2.33 09/27/2023

## 2024-01-15 NOTE — Assessment & Plan Note (Signed)
 Multifactorial etiology including alcohol abuse and hearing loss .  Screening for reversible causes  has revealed  low b12  and  low folate, both of which are being addressed.   Lab Results  Component Value Date   VITAMINB12 542 01/15/2024   Lab Results  Component Value Date   FOLATE >22.9 01/15/2024   No results found for: RPR Lab Results  Component Value Date   TSH 2.33 09/27/2023

## 2024-01-15 NOTE — Assessment & Plan Note (Signed)
 Severe, secondary to tobacco abuse.  Previously managed by Deatrice Nett,  now by Nickolas Cellar. She is asymptomatic and not  hypoxic at rest . She has a Portable oxygen  tank  and ambulatory sats drop < 90% requiring increase in oxygen  to 4 L/min to maintain sat > 90% . t. Continue nocturnal use of supplemental oxygen  and  ICS  And bronchodilators.  End of life discussion started , with discussion of DNR.SABRA  She is contemplative .

## 2024-01-15 NOTE — Progress Notes (Signed)
 Subjective:  Patient ID: Mary Howe, female    DOB: April 30, 1932  Age: 89 y.o. MRN: [id number]  CC: The primary encounter diagnosis was Encounter for screening mammogram for malignant neoplasm of breast. Diagnoses of Gastroesophageal reflux disease, B12 deficiency, Hypertension, unspecified type, NICM (nonischemic cardiomyopathy) (HCC), Hypertension associated with type 2 diabetes mellitus (HCC), Panlobular emphysema (HCC), Cognitive decline, and Acquired hypothyroidism were also pertinent to this visit.   HPI Mary Howe presents for  Chief Complaint  Patient presents with   Medical Management of Chronic Issues    Yearly follow up    1) Chronic hypoxic respiratory failure :  she denies shortness of breath at rest and with conversation but becomes quite dyspneic with ambulation. She is wearing supplemental oxygen    2) her March 2025  CT to follow up on LLL  pulmonary nodule now shows a RLL,  she hasnot been scheduled for 6 month follow up  CT   3) HOH: wearing hearing aids but still HOH  4)  cognitive decline :   daughter Fronie has noticed short term memory losses,  repeating herself,  forgetting to take medications .  Fronie is managing her medications .  She has a history of depression complicated by  b 12 deficiency and alcohol abuse.    Outpatient Medications Prior to Visit  Medication Sig Dispense Refill   acetaminophen  (TYLENOL ) 500 MG tablet Take 500 mg by mouth every 6 (six) hours as needed.     arformoterol  (BROVANA ) 15 MCG/2ML NEBU Inhale 2 mLs (15 mcg total) by nebulization 2 (two) times daily. 120 mL 10   budesonide  (PULMICORT ) 0.5 MG/2ML nebulizer solution USE 1 VIAL VIA NEBULIZER TWICE DAILY 120 mL 11   calcium  carbonate (OS-CAL) 600 MG TABS tablet Take 600 mg by mouth daily with breakfast.      cetirizine  (ZYRTEC ) 10 MG tablet Take 1 tablet (10 mg total) by mouth daily. 90 tablet 1   folic acid  (FOLVITE ) 1 MG tablet Take 1 tablet (1 mg total) by mouth daily. 90 tablet 3    furosemide  (LASIX ) 20 MG tablet Take 1 tablet (20 mg total) by mouth daily. TAKE 1 EXTRA TABLET EVERY EVENING FOR SWELLING. 90 tablet 2   MULTIPLE VITAMIN PO Take 1 tablet by mouth daily.      OXYGEN  Inhale 2.5 L daily into the lungs.     atorvastatin  (LIPITOR) 20 MG tablet Take 1 tablet (20 mg total) by mouth every other day. 45 tablet 3   bisoprolol  (ZEBETA ) 5 MG tablet Take 0.5 tablets (2.5 mg total) by mouth daily. 45 tablet 1   escitalopram  (LEXAPRO ) 20 MG tablet Take 1 tablet (20 mg total) by mouth daily. 90 tablet 1   levothyroxine  (SYNTHROID ) 50 MCG tablet Take 1 tablet (50 mcg total) by mouth daily at 6 (six) AM. 90 tablet 1   pantoprazole  (PROTONIX ) 40 MG tablet Take 1 tablet (40 mg total) by mouth daily. 90 tablet 1   No facility-administered medications prior to visit.    Review of Systems;  Patient denies headache, fevers, malaise, unintentional weight loss, skin rash, eye pain, sinus congestion and sinus pain, sore throat, dysphagia,  hemoptysis , cough, dyspnea, wheezing, chest pain, palpitations, orthopnea, edema, abdominal pain, nausea, melena, diarrhea, constipation, flank pain, dysuria, hematuria, urinary  Frequency, nocturia, numbness, tingling, seizures,  Focal weakness, Loss of consciousness,  Tremor, insomnia, depression, anxiety, and suicidal ideation.      Objective:  BP 136/66   Pulse 81   Ht 5'  4 (1.626 m)   Wt 137 lb 9.6 oz (62.4 kg)   SpO2 96%   BMI 23.62 kg/m   BP Readings from Last 3 Encounters:  01/15/24 136/66  09/27/23 112/70  06/21/23 112/60    Wt Readings from Last 3 Encounters:  01/15/24 137 lb 9.6 oz (62.4 kg)  09/27/23 142 lb (64.4 kg)  06/21/23 143 lb (64.9 kg)    Physical Exam Vitals reviewed.  Constitutional:      General: She is not in acute distress.    Appearance: Normal appearance. She is normal weight. She is not ill-appearing, toxic-appearing or diaphoretic.  HENT:     Head: Normocephalic.  Eyes:     General: No  scleral icterus.       Right eye: No discharge.        Left eye: No discharge.     Conjunctiva/sclera: Conjunctivae normal.  Cardiovascular:     Rate and Rhythm: Normal rate and regular rhythm.     Heart sounds: Normal heart sounds.  Pulmonary:     Effort: Pulmonary effort is normal. No respiratory distress.     Breath sounds: Normal breath sounds.  Musculoskeletal:        General: Normal range of motion.  Skin:    General: Skin is warm and dry.  Neurological:     General: No focal deficit present.     Mental Status: She is alert and oriented to person, place, and time. Mental status is at baseline.  Psychiatric:        Mood and Affect: Mood normal.        Behavior: Behavior normal.        Thought Content: Thought content normal.        Judgment: Judgment normal.     Lab Results  Component Value Date   HGBA1C 5.7 09/27/2023   HGBA1C 5.8 (H) 01/13/2023   HGBA1C 5.8 (H) 01/28/2022    Lab Results  Component Value Date   CREATININE 0.62 01/15/2024   CREATININE 0.68 09/27/2023   CREATININE 0.62 01/13/2023    Lab Results  Component Value Date   WBC 5.3 09/27/2023   HGB 13.9 09/27/2023   HCT 41.7 09/27/2023   PLT 234.0 09/27/2023   GLUCOSE 118 (H) 01/15/2024   CHOL 218 (H) 09/27/2023   TRIG 81.0 09/27/2023   HDL 113.50 09/27/2023   LDLDIRECT 89.0 09/27/2023   LDLCALC 88 09/27/2023   ALT 21 01/15/2024   AST 27 01/15/2024   NA 139 01/15/2024   K 4.0 01/15/2024   CL 98 01/15/2024   CREATININE 0.62 01/15/2024   BUN 9 01/15/2024   CO2 27 01/15/2024   TSH 2.33 09/27/2023   INR 1.09 01/16/2017   HGBA1C 5.7 09/27/2023   MICROALBUR 0.4 01/13/2023    CT CHEST WO CONTRAST Result Date: 06/28/2023 CLINICAL DATA:  Pulmonary nodule follow-up EXAM: CT CHEST WITHOUT CONTRAST TECHNIQUE: Multidetector CT imaging of the chest was performed following the standard protocol without IV contrast. RADIATION DOSE REDUCTION: This exam was performed according to the departmental  dose-optimization program which includes automated exposure control, adjustment of the mA and/or kV according to patient size and/or use of iterative reconstruction technique. COMPARISON:  February 22, 2023 FINDINGS: Cardiovascular: No significant vascular findings. Normal heart size. No pericardial effusion. Mild coronary artery calcifications. No pericardial effusions. Ascending aorta unremarkable Mediastinum/Nodes: No enlarged mediastinal or axillary lymph nodes. Thyroid  gland, trachea, and esophagus demonstrate no significant findings. Previously described possible pericardial cyst clearly identified on today's images. Lungs/Pleura:  Extensive centrilobular and paraseptal emphysematous changes stable since prior examination. Bilobed density in the peripheral portion of the right lower lobe image 71 measuring 10 mm in maximum diameter. New since prior examination. 10 x 9 mm pleural-based nodular density in the left lower lobe unchanged since prior examination. Upper Abdomen: No acute abnormality. Musculoskeletal: No chest wall mass or suspicious bone lesions identified. IMPRESSION: *Extensive centrilobular and paraseptal emphysematous changes stable since prior examination. *Bilobed density in the peripheral portion of the right lower lobe measuring 10 mm in maximum diameter. New since prior examination. Lung-RADS 3, probably benign findings. Short-term follow-up in 6 months is recommended with repeat low-dose chest CT without contrast (please use the following order, CT CHEST LCS NODULE FOLLOW-UP W/O CM). *10 x 9 mm pleural-based nodular density in the left lower lobe unchanged since prior examination. Electronically Signed   By: Franky Chard M.D.   On: 06/28/2023 18:42    Assessment & Plan:  .Encounter for screening mammogram for malignant neoplasm of breast  Gastroesophageal reflux disease -     Pantoprazole  Sodium; Take 1 tablet (40 mg total) by mouth daily.  Dispense: 90 tablet; Refill: 1  B12  deficiency -     B12 and Folate Panel  Hypertension, unspecified type -     Comprehensive metabolic panel with GFR  NICM (nonischemic cardiomyopathy) (HCC) Assessment & Plan: Secondary to pulmonary hypertension and diastolic dysfunction.  Continue BP control ,  noted home bps  < 130/80 with bisoprolol  and prn furosemide   .   Hypertension associated with type 2 diabetes mellitus (HCC) Assessment & Plan:  Her diabetes remains  well-controlled on diet alone .  hemoglobin A1c has been consistently at or  less than 7.0 . Patient is up-to-date on eye exams and foot exam is normal today. Patient has no microalbuminuria. Patient is intolerant of statin therapy  secondary to statin myalgia with previous trial of simvastatin ; however her HDL:LDL  ratio is 1.   Lab Results  Component Value Date   HGBA1C 5.7 09/27/2023    Lab Results  Component Value Date   MICROALBUR 0.4 01/13/2023     Lab Results  Component Value Date   CHOL 218 (H) 09/27/2023   HDL 113.50 09/27/2023   LDLCALC 88 09/27/2023   LDLDIRECT 89.0 09/27/2023   TRIG 81.0 09/27/2023   CHOLHDL 2 09/27/2023      Panlobular emphysema (HCC) Assessment & Plan: Severe, secondary to tobacco abuse.  Previously managed by Deatrice Nett,  now by Nickolas Cellar. She is asymptomatic and not  hypoxic at rest . She has a Portable oxygen  tank  and ambulatory sats drop < 90% requiring increase in oxygen  to 4 L/min to maintain sat > 90% . t. Continue nocturnal use of supplemental oxygen  and  ICS  And bronchodilators.  End of life discussion started , with discussion of DNR.SABRA  She is contemplative .    Cognitive decline Assessment & Plan: Multifactorial etiology including alcohol abuse and hearing loss .  Screening for reversible causes  has revealed  low b12  and  low folate, both of which are being addressed.   Lab Results  Component Value Date   VITAMINB12 542 01/15/2024   Lab Results  Component Value Date   FOLATE >22.9 01/15/2024    No results found for: RPR Lab Results  Component Value Date   TSH 2.33 09/27/2023      Acquired hypothyroidism Assessment & Plan: Managed with low dose levothyroxine ,  But has been skipping a  dose weekly due to concurrent use of alendronate  .  Repeat level is due   Lab Results  Component Value Date   TSH 2.33 09/27/2023      Other orders -     Atorvastatin  Calcium ; Take 1 tablet (20 mg total) by mouth every other day.  Dispense: 45 tablet; Refill: 3 -     Bisoprolol  Fumarate; Take 0.5 tablets (2.5 mg total) by mouth daily.  Dispense: 45 tablet; Refill: 1 -     Escitalopram  Oxalate; Take 1 tablet (20 mg total) by mouth daily.  Dispense: 90 tablet; Refill: 1 -     Levothyroxine  Sodium; Take 1 tablet (50 mcg total) by mouth daily at 6 (six) AM.  Dispense: 90 tablet; Refill: 1    Follow-up: Return in about 6 months (around 07/15/2024).   Mary LITTIE Kettering, MD

## 2024-01-15 NOTE — Progress Notes (Signed)
 STAT CT CHEST NEEDED

## 2024-01-16 LAB — INTRINSIC FACTOR ANTIBODIES: Intrinsic Factor Abs, Serum: 1 [AU]/ml (ref 0.0–1.1)

## 2024-01-17 ENCOUNTER — Ambulatory Visit: Payer: Self-pay | Admitting: Internal Medicine

## 2024-01-18 ENCOUNTER — Encounter: Payer: Self-pay | Admitting: Internal Medicine

## 2024-01-18 NOTE — Telephone Encounter (Signed)
 Noted

## 2024-01-23 ENCOUNTER — Telehealth: Payer: Self-pay

## 2024-01-23 ENCOUNTER — Encounter: Payer: Self-pay | Admitting: Internal Medicine

## 2024-01-23 ENCOUNTER — Other Ambulatory Visit: Payer: Self-pay

## 2024-01-23 ENCOUNTER — Ambulatory Visit: Payer: Medicare (Managed Care) | Admitting: Internal Medicine

## 2024-01-23 VITALS — BP 120/70 | HR 83 | Temp 98.5°F | Ht 64.0 in | Wt 139.4 lb

## 2024-01-23 DIAGNOSIS — J9611 Chronic respiratory failure with hypoxia: Secondary | ICD-10-CM | POA: Diagnosis not present

## 2024-01-23 DIAGNOSIS — J449 Chronic obstructive pulmonary disease, unspecified: Secondary | ICD-10-CM | POA: Diagnosis not present

## 2024-01-23 DIAGNOSIS — R0689 Other abnormalities of breathing: Secondary | ICD-10-CM

## 2024-01-23 DIAGNOSIS — J439 Emphysema, unspecified: Secondary | ICD-10-CM | POA: Diagnosis not present

## 2024-01-23 DIAGNOSIS — R911 Solitary pulmonary nodule: Secondary | ICD-10-CM

## 2024-01-23 MED ORDER — ROFLUMILAST 500 MCG PO TABS
250.0000 ug | ORAL_TABLET | Freq: Every day | ORAL | 1 refills | Status: AC
  Filled 2024-01-23 (×2): qty 30, 60d supply, fill #0
  Filled 2024-03-27: qty 30, 60d supply, fill #1

## 2024-01-23 NOTE — Telephone Encounter (Signed)
*  Pulm  Pharmacy Patient Advocate Encounter   Received notification from CoverMyMeds that prior authorization for Roflumilast 500MCG tablets  is required/requested.   Insurance verification completed.   The patient is insured through [employer].   Per test claim: PA required; PA started via CoverMyMeds. KEY [id number] . Please see clinical question(s) below that I am not finding the answer to in their chart and advise.   Has the patient tried a medication from two of the following three drug categories: long acting beta2 agonist (LABA) [such as salmeterol (Serevent),indacaterol (Arcapta Neohaler)], long acting muscarinic antagonist (LAMA) [such as tiotropium (Spiriva)], inhaled corticosteroid [such as fluticasone]?

## 2024-01-23 NOTE — Telephone Encounter (Signed)
 [facility] outpatient pharmacy advised. NFN.

## 2024-01-23 NOTE — Telephone Encounter (Signed)
 Pharmacy Patient Advocate Encounter  Received notification from [employer] that Prior Authorization for Roflumilast 500MCG tablets   has been APPROVED from 01/23/2024 to 01/22/2025

## 2024-01-23 NOTE — Telephone Encounter (Signed)
 Submitted with additional information and chart notes stating patient cannot tolerate traditional inhaler therapy.

## 2024-01-23 NOTE — Telephone Encounter (Signed)
 Patient has resp insufficiency and will NOT be able to tolerate traditional  inhaler therapy, is currently on NEB therapy

## 2024-01-23 NOTE — Patient Instructions (Signed)
 Lets plan to obtain a PET scan to assess lung nodule  Continue oxygen  as prescribed  Continue nebulizers scheduled in the morning and in the evening  Lets plan to start Daliresp 250 mcg once daily  Avoid Allergens and Irritants Avoid secondhand smoke Avoid SICK contacts Recommend  Masking  when appropriate Recommend Keep up-to-date with vaccinations

## 2024-01-23 NOTE — Progress Notes (Signed)
 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   Neuropathy 02/19/2015   Phlebectasia 02/19/2015   Stasis, venous 02/19/2015   Age related osteoporosis 01/30/2016   History of Graves' disease 01/30/2016   COPD (chronic obstructive pulmonary disease) (HCC) 02/06/2016   Post-nasal drip 05/07/2016   Anxiety, generalized 06/22/2016   Renal cyst, left 11/25/2016   S/P cholecystectomy 11/25/2016   Myalgia due to statin 12/17/2016   NICM (nonischemic cardiomyopathy) (HCC) 05/22/2017   Acquired hypothyroidism 07/12/2020   Chronic respiratory failure with hypoxia (HCC) 07/31/2020   Aortic atherosclerosis 01/14/2021   B12 deficiency 02/12/2021   Tremor of left hand 07/28/2021   History of fall within past 90 days 07/29/2021   COPD exacerbation (HCC) 01/28/2022   Alcohol abuse 01/30/2022   Cognitive decline 09/28/2023   Folate deficiency 09/28/2023   Resolved Ambulatory Problems    Diagnosis Date Noted   Hypercholesterolemia 02/19/2015   Chemical diabetes 02/19/2015   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   Avitaminosis D 02/19/2015   Acute respiratory failure with hypercapnia (HCC) 01/12/2016   Shingles outbreak 01/30/2016   Panlobular emphysema (HCC) 01/30/2016   Essential hypertension 01/30/2016   Hospital discharge follow-up 01/30/2016   Back pain 06/10/2016   COPD with acute exacerbation (HCC) 06/10/2016   Diabetes mellitus without complication (HCC) 07/03/2016   Elevated liver enzymes 11/12/2016   Pancreatic mass 11/25/2016   CAP (community acquired pneumonia) 01/11/2017   Acute respiratory failure with hypoxia (HCC) 01/11/2017   Elevated troponin 01/11/2017    Acute systolic heart failure (HCC)    Nocturnal hypoxia 05/22/2017   Leg swelling 06/13/2017   Hypertension associated with type 2 diabetes mellitus (HCC) 06/07/2018   Close exposure to COVID-19 virus 11/04/2018   Contact dermatitis due to plant 11/03/2019   Hyperlipidemia associated with type 2 diabetes mellitus (HCC) 07/12/2020   Past Medical History:  Diagnosis Date   CHF (congestive heart failure) (HCC)    Dental bridge present    Diverticulitis 2015   GERD (gastroesophageal reflux disease)    Hyperlipidemia    PAH (pulmonary artery hypertension) (HCC)    Takotsubo cardiomyopathy    Thyroid  disease    Wears hearing aid in both ears      Past Surgical History:   Past Surgical History:  Procedure Laterality Date   CATARACT EXTRACTION W/PHACO Right 10/10/2017   Procedure: CATARACT EXTRACTION PHACO AND INTRAOCULAR LENS PLACEMENT (IOC)  RIGHT;  Surgeon: Mittie Gaskin, MD;  Location: [facility];  Service: Ophthalmology;  Laterality: Right;   CATARACT EXTRACTION W/PHACO Left 11/15/2017   Procedure: CATARACT EXTRACTION PHACO AND INTRAOCULAR LENS PLACEMENT (IOC)  LEFT;  Surgeon: Mittie Gaskin, MD;  Location: [facility];  Service: Ophthalmology;  Laterality: Left;   CERVICAL CONE BIOPSY     CHOLECYSTECTOMY     HEMORROIDECTOMY     RIGHT/LEFT HEART CATH AND CORONARY ANGIOGRAPHY N/A 01/16/2017   Procedure: RIGHT/LEFT HEART CATH AND CORONARY ANGIOGRAPHY;  Surgeon: Darron Deatrice LABOR, MD;  Location: [facility] INVASIVE CV LAB;  Service: Cardiovascular;  Laterality: N/A;         CC Follow-up assessment for COPD Follow-up assessment for chronic hypoxic respiratory failure Follow-up assessment for abnormal CT chest lung nodule    Subjective:  Follow-up assessment for COPD Bilateral emphysematous changes on CT scan Nebulized therapy has worked well over the last several months Continue Pulmicort  and Brovana  nebulizers Plan to start Daliresp  No exacerbation  at this time No evidence of heart failure at this time No evidence or signs of infection at this time No respiratory distress No fevers, chills, nausea, vomiting, diarrhea No evidence of lower extremity edema No evidence hemoptysis   Continues to wear oxygen   No acute issues at this time   Abnormal CT chest Severe bilateral emphysematous changes Most current CT scan March 2025 there is a left lower lobe lung nodule that is suspicious for lung malignancy repeat CT chest January 15, 2024 shows increase in size Case reviewed with interventional radiology recommend PET scan    Physical Exam:  BP 120/70   Pulse 83   Temp 98.5 F (36.9 C)   Ht 5' 4 (1.626 m)   Wt 139 lb 6.4 oz (63.2 kg)   SpO2 93% Comment: on 3L of o2  BMI 23.93 kg/m      Review of Systems: Gen:  Denies  fever, sweats, chills weight loss  HEENT: Denies blurred vision, double vision, ear pain, eye pain, hearing loss, nose bleeds, sore throat Cardiac:  No dizziness, chest pain or heaviness, chest tightness,edema, No JVD Resp:   No cough, -sputum production, +shortness of breath,-wheezing, -hemoptysis,  Other:  All other systems negative   Physical Examination:   General Appearance: No distress  EYES PERRLA, EOM intact.   NECK Supple, No JVD Pulmonary: normal breath sounds, No wheezing.  CardiovascularNormal S1,S2.  No m/r/g.   Abdomen: Benign, Soft, non-tender. Neurology UE/LE 5/5 strength, no focal deficits Ext pulses intact, cap refill intact ALL OTHER ROS ARE NEGATIVE    Pulmonary testing:   PFTs (02/04/16): very mild obstruction (FEV1 1.6 liters, 97% pred, FEV1/FVC 61%, flow-vol curve c/w obstruction), Normal lung volumes, mild decrease in DLCO I REVIEWED PFT's IN DETAIL WITH PATIENT 02/22/2023   Chest Imaging:  CXR (01/13/16):  Hyperinflation, NACPD CT chest (01/12/16): severe B upper lobe emphysema, INDEPENDENTLY REVIEWED BY ME TODAY 02/22/2023    CT chest 02/2023 New findings left  lower lobe lung nodule CT chest March 2025 left lower lobe lung nodule still persistent, awaiting final report from radiology to determine interval changes in size Sleep Tests:    Cardiac Tests:  ECHO 2018 NL EF, Grade 1 diastolic dysfunction  Social History:  Former smoker-1 pack a day for 40 years  Family History:       Assessment/Plan:   89 year old pleasant white female seen today for follow-up abnormal CT chest consistent with severe bilateral emphysematous changes also with left lower lobe lung nodule in the setting of severe bilateral emphysema with COPD end-stage with chronic shortness of breath and dyspnea exertion with hypoxic respiratory failure and respiratory insufficiency   Respiratory insufficiency Continue oxygen  as prescribed No exacerbation at this time No evidence of heart failure at this time No evidence or signs of infection at this time No respiratory distress No fevers, chills, nausea, vomiting, diarrhea No evidence of lower extremity edema No evidence hemoptysis  Severe end-stage COPD Continue nebulized therapy at this time Patient is unable to use traditional inhaler therapy No indication for antibiotics or prednisone  Plan to start Daliresp therapy   Hypoxia Continue oxygen  as prescribed  Abnormal CT chest Left lower lobe lung nodule High suspicious for cancer Repeat CT chest October 2025 shows increase in size Case discussed with interventional radiology on-call recommend  PET scan There is a possibility of CT-guided biopsy Patient is a high risk for complications if she was to undergo general anesthesia and robotics navigation Patient and her daughter are satisfied with this plan of action    MEDICATION ADJUSTMENTS/LABS AND TESTS ORDERED: Obtain PET scan  continue Oxygen  therapy Continue nebulized therapy as prescribed Avoid Allergens and Irritants Avoid secondhand smoke Avoid SICK contacts Recommend  Masking  when  appropriate Recommend Keep up-to-date with vaccinations Start Daliresp 250 mcg daily  MEDICATION ADJUSTMENTS/LABS AND TESTS ORDERED:    CURRENT MEDICATIONS REVIEWED AT LENGTH WITH PATIENT TODAY   Patient  satisfied with Plan of action and management. All questions answered   Follow up 4 weeks   I spent a total of 47 minutes dedicated to the care of this patient on the date of this encounter to include pre-visit review of records, face-to-face time with the patient discussing conditions above, post visit ordering of testing, clinical documentation with the electronic health record, making appropriate referrals as documented, and communicating necessary information to the patient's healthcare team.    The Patient requires high complexity decision making for assessment and support, frequent evaluation and titration of therapies, application of advanced monitoring technologies and extensive interpretation of multiple databases.  Patient satisfied with Plan of action and management. All questions answered    Nickolas Alm Cellar, M.D.  [facility]  Medical Director [facility]

## 2024-01-26 ENCOUNTER — Ambulatory Visit: Payer: Medicare (Managed Care)

## 2024-01-26 ENCOUNTER — Other Ambulatory Visit: Payer: Medicare (Managed Care)

## 2024-01-30 ENCOUNTER — Ambulatory Visit: Payer: Self-pay

## 2024-01-30 ENCOUNTER — Emergency Department: Payer: Medicare (Managed Care)

## 2024-01-30 ENCOUNTER — Ambulatory Visit
Admission: RE | Admit: 2024-01-30 | Discharge: 2024-01-30 | Disposition: A | Discharge status: Home / Self Care | Payer: Medicare (Managed Care) | Source: Ambulatory Visit | Attending: Internal Medicine | Admitting: Internal Medicine

## 2024-01-30 ENCOUNTER — Encounter: Payer: Self-pay | Admitting: Emergency Medicine

## 2024-01-30 ENCOUNTER — Emergency Department
Admission: EM | Admit: 2024-01-30 | Discharge: 2024-01-30 | Disposition: A | Discharge status: Home / Self Care | Payer: Medicare (Managed Care) | Attending: Emergency Medicine | Admitting: Emergency Medicine

## 2024-01-30 ENCOUNTER — Other Ambulatory Visit: Payer: Self-pay

## 2024-01-30 DIAGNOSIS — J449 Chronic obstructive pulmonary disease, unspecified: Secondary | ICD-10-CM | POA: Insufficient documentation

## 2024-01-30 DIAGNOSIS — I700 Atherosclerosis of aorta: Secondary | ICD-10-CM | POA: Insufficient documentation

## 2024-01-30 DIAGNOSIS — I509 Heart failure, unspecified: Secondary | ICD-10-CM | POA: Insufficient documentation

## 2024-01-30 DIAGNOSIS — I110 Hypertensive heart disease with heart failure: Secondary | ICD-10-CM | POA: Insufficient documentation

## 2024-01-30 DIAGNOSIS — J90 Pleural effusion, not elsewhere classified: Secondary | ICD-10-CM | POA: Insufficient documentation

## 2024-01-30 DIAGNOSIS — R0789 Other chest pain: Secondary | ICD-10-CM | POA: Diagnosis present

## 2024-01-30 DIAGNOSIS — C3432 Malignant neoplasm of lower lobe, left bronchus or lung: Secondary | ICD-10-CM | POA: Insufficient documentation

## 2024-01-30 DIAGNOSIS — J439 Emphysema, unspecified: Secondary | ICD-10-CM | POA: Insufficient documentation

## 2024-01-30 DIAGNOSIS — R911 Solitary pulmonary nodule: Secondary | ICD-10-CM | POA: Diagnosis present

## 2024-01-30 DIAGNOSIS — I2510 Atherosclerotic heart disease of native coronary artery without angina pectoris: Secondary | ICD-10-CM | POA: Insufficient documentation

## 2024-01-30 LAB — CBC WITH DIFFERENTIAL/PLATELET
Abs Immature Granulocytes: 0.03 K/uL (ref 0.00–0.07)
Basophils Absolute: 0 K/uL (ref 0.0–0.1)
Basophils Relative: 1 %
Eosinophils Absolute: 0.1 K/uL (ref 0.0–0.5)
Eosinophils Relative: 1 %
HCT: 39.4 % (ref 36.0–46.0)
Hemoglobin: 13.1 g/dL (ref 12.0–15.0)
Immature Granulocytes: 1 %
Lymphocytes Relative: 22 %
Lymphs Abs: 1.2 K/uL (ref 0.7–4.0)
MCH: 32.6 pg (ref 26.0–34.0)
MCHC: 33.2 g/dL (ref 30.0–36.0)
MCV: 98 fL (ref 80.0–100.0)
Monocytes Absolute: 0.6 K/uL (ref 0.1–1.0)
Monocytes Relative: 12 %
Neutro Abs: 3.5 K/uL (ref 1.7–7.7)
Neutrophils Relative %: 63 %
Platelets: 242 K/uL (ref 150–400)
RBC: 4.02 MIL/uL (ref 3.87–5.11)
RDW: 12.7 % (ref 11.5–15.5)
WBC: 5.5 K/uL (ref 4.0–10.5)
nRBC: 0 % (ref 0.0–0.2)

## 2024-01-30 LAB — COMPREHENSIVE METABOLIC PANEL WITH GFR
ALT: 20 U/L (ref 0–44)
AST: 32 U/L (ref 15–41)
Albumin: 3.5 g/dL (ref 3.5–5.0)
Alkaline Phosphatase: 54 U/L (ref 38–126)
Anion gap: 11 (ref 5–15)
BUN: 5 mg/dL — ABNORMAL LOW (ref 8–23)
CO2: 26 mmol/L (ref 22–32)
Calcium: 9 mg/dL (ref 8.9–10.3)
Chloride: 100 mmol/L (ref 98–111)
Creatinine, Ser: 0.41 mg/dL — ABNORMAL LOW (ref 0.44–1.00)
GFR, Estimated: >60 mL/min (ref >60)
Glucose, Bld: 94 mg/dL (ref 70–99)
Potassium: 3.4 mmol/L — ABNORMAL LOW (ref 3.5–5.1)
Sodium: 137 mmol/L (ref 135–145)
Total Bilirubin: 0.7 mg/dL (ref 0.0–1.2)
Total Protein: 6.7 g/dL (ref 6.5–8.1)

## 2024-01-30 LAB — GLUCOSE, CAPILLARY: Glucose-Capillary: 90 mg/dL (ref 70–99)

## 2024-01-30 MED ORDER — IOHEXOL 300 MG/ML  SOLN
100.0000 mL | Freq: Once | INTRAMUSCULAR | Status: AC | PRN
  Administered 2024-01-30: 100 mL via INTRAVENOUS

## 2024-01-30 MED ORDER — FLUDEOXYGLUCOSE F - 18 (FDG) INJECTION
7.6600 | Freq: Once | INTRAVENOUS | Status: AC | PRN
Start: 2024-01-30 — End: 2024-01-30
  Administered 2024-01-30: 7.66 via INTRAVENOUS
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
## 2024-02-01 ENCOUNTER — Other Ambulatory Visit: Payer: Self-pay

## 2024-02-01 ENCOUNTER — Ambulatory Visit: Payer: Self-pay | Admitting: Internal Medicine

## 2024-02-01 DIAGNOSIS — R911 Solitary pulmonary nodule: Secondary | ICD-10-CM

## 2024-02-07 ENCOUNTER — Telehealth: Payer: Self-pay | Admitting: Radiation Oncology

## 2024-02-07 NOTE — Telephone Encounter (Signed)
 Patients daughter left a voicemail to reschedule her appointment to later in the afternoon. She request a call back. Thank you

## 2024-02-08 ENCOUNTER — Ambulatory Visit: Payer: Medicare (Managed Care) | Admitting: Radiation Oncology

## 2024-02-14 ENCOUNTER — Ambulatory Visit: Payer: Medicare (Managed Care) | Admitting: Radiation Oncology

## 2024-02-15 ENCOUNTER — Other Ambulatory Visit: Payer: Self-pay

## 2024-02-27 ENCOUNTER — Ambulatory Visit: Payer: Medicare (Managed Care) | Admitting: Internal Medicine

## 2024-02-28 ENCOUNTER — Ambulatory Visit
Admission: RE | Admit: 2024-02-28 | Discharge: 2024-02-28 | Disposition: A | Discharge status: Home / Self Care | Payer: Medicare (Managed Care) | Source: Ambulatory Visit | Attending: Radiation Oncology | Admitting: Radiation Oncology

## 2024-02-28 ENCOUNTER — Other Ambulatory Visit: Payer: Self-pay

## 2024-02-28 VITALS — BP 141/61 | HR 73 | Ht 64.0 in | Wt 126.0 lb

## 2024-02-28 DIAGNOSIS — I110 Hypertensive heart disease with heart failure: Secondary | ICD-10-CM | POA: Diagnosis not present

## 2024-02-28 DIAGNOSIS — Z79890 Hormone replacement therapy (postmenopausal): Secondary | ICD-10-CM | POA: Insufficient documentation

## 2024-02-28 DIAGNOSIS — J449 Chronic obstructive pulmonary disease, unspecified: Secondary | ICD-10-CM | POA: Insufficient documentation

## 2024-02-28 DIAGNOSIS — H9190 Unspecified hearing loss, unspecified ear: Secondary | ICD-10-CM | POA: Diagnosis not present

## 2024-02-28 DIAGNOSIS — Z974 Presence of external hearing-aid: Secondary | ICD-10-CM | POA: Diagnosis not present

## 2024-02-28 DIAGNOSIS — J439 Emphysema, unspecified: Secondary | ICD-10-CM | POA: Insufficient documentation

## 2024-02-28 DIAGNOSIS — B029 Zoster without complications: Secondary | ICD-10-CM | POA: Diagnosis not present

## 2024-02-28 DIAGNOSIS — Z808 Family history of malignant neoplasm of other organs or systems: Secondary | ICD-10-CM | POA: Diagnosis not present

## 2024-02-28 DIAGNOSIS — I509 Heart failure, unspecified: Secondary | ICD-10-CM | POA: Insufficient documentation

## 2024-02-28 DIAGNOSIS — Z8042 Family history of malignant neoplasm of prostate: Secondary | ICD-10-CM | POA: Diagnosis not present

## 2024-02-28 DIAGNOSIS — I2721 Secondary pulmonary arterial hypertension: Secondary | ICD-10-CM | POA: Diagnosis not present

## 2024-02-28 DIAGNOSIS — Z7951 Long term (current) use of inhaled steroids: Secondary | ICD-10-CM | POA: Diagnosis not present

## 2024-02-28 DIAGNOSIS — Z79899 Other long term (current) drug therapy: Secondary | ICD-10-CM | POA: Diagnosis not present

## 2024-02-28 DIAGNOSIS — R911 Solitary pulmonary nodule: Secondary | ICD-10-CM | POA: Diagnosis present

## 2024-02-28 DIAGNOSIS — E785 Hyperlipidemia, unspecified: Secondary | ICD-10-CM | POA: Insufficient documentation

## 2024-02-28 DIAGNOSIS — Z87891 Personal history of nicotine dependence: Secondary | ICD-10-CM | POA: Insufficient documentation

## 2024-02-28 MED ORDER — AREXVY 120 MCG/0.5ML IM SUSR
INTRAMUSCULAR | 0 refills | Status: AC
  Filled 2024-02-28: qty 0.5, 1d supply, fill #0

## 2024-02-28 NOTE — Progress Notes (Signed)
 NEW PATIENT EVALUATION  Name: Mary Howe  MRN: [id number]  Date:   02/28/2024     DOB: 1932/12/17   This 89 y.o. female patient presents to the clinic for initial evaluation of probable stage I non-small cell lung cancer of the left lower lobe.  REFERRING PHYSICIAN: Marylynn Verneita CROME, MD  CHIEF COMPLAINT:  Chief Complaint  Patient presents with   Lung Cancer    DIAGNOSIS: The encounter diagnosis was Solitary pulmonary nodule.   PREVIOUS INVESTIGATIONS:  CT scans PET CT scans reviewed Labs reviewed Clinical notes reviewed  HPI: Patient is an 89 year old female with multiple medical comorbidities including significant COPD emphysema.  She has been followed by Dr. Isaiah for a progressive left lower lobe lesion.  It is recently grown on CT scan in October showing a now 1.7 cm subpleural nodule of the posterior left lower lobe concerning for malignancy.  She also has moderate emphysematous changes and mild bibasilar atelectasis.  PET scan was performed showing hypermetabolic left lower lobe nodule indicative of stage Ia primary bronchogenic carcinoma.  There is also some minimal hypermetabolic activity in the proximal right femur for which follow-up on examination was recommended.  Based on her age and overall general condition and medical comorbidities she is not a candidate for biopsy.  She is now referred to radiation oncology for consideration of SBRT treatment.  She is having no significant cough hemoptysis or chest tightness at this time.  Patient is significantly hard of hearing although accompanied by her daughter.  PLANNED TREATMENT REGIMEN: SBRT  PAST MEDICAL HISTORY:  has a past medical history of CHF (congestive heart failure) (HCC), COPD (chronic obstructive pulmonary disease) (HCC), Dental bridge present, Diverticulitis (2015), Essential hypertension, GERD (gastroesophageal reflux disease), Hyperlipidemia, PAH (pulmonary artery hypertension) (HCC), Shingles outbreak  (01/30/2016), Takotsubo cardiomyopathy, Thyroid  disease, and Wears hearing aid in both ears.    PAST SURGICAL HISTORY:  Past Surgical History:  Procedure Laterality Date   CATARACT EXTRACTION W/PHACO Right 10/10/2017   Procedure: CATARACT EXTRACTION PHACO AND INTRAOCULAR LENS PLACEMENT (IOC)  RIGHT;  Surgeon: Mittie Gaskin, MD;  Location: [facility];  Service: Ophthalmology;  Laterality: Right;   CATARACT EXTRACTION W/PHACO Left 11/15/2017   Procedure: CATARACT EXTRACTION PHACO AND INTRAOCULAR LENS PLACEMENT (IOC)  LEFT;  Surgeon: Mittie Gaskin, MD;  Location: [facility];  Service: Ophthalmology;  Laterality: Left;   CERVICAL CONE BIOPSY     CHOLECYSTECTOMY     HEMORROIDECTOMY     RIGHT/LEFT HEART CATH AND CORONARY ANGIOGRAPHY N/A 01/16/2017   Procedure: RIGHT/LEFT HEART CATH AND CORONARY ANGIOGRAPHY;  Surgeon: Darron Deatrice LABOR, MD;  Location: [facility] INVASIVE CV LAB;  Service: Cardiovascular;  Laterality: N/A;    FAMILY HISTORY: family history includes Aneurysm in her sister; Bone cancer in her sister; Brain cancer in her brother; CAD in her brother and brother; Emphysema in her sister; Fibrocystic breast disease in her sister; Heart attack in her brother; Heart disease in her brother and sister; Lung disease in her brother; Pneumonia in her brother; Prostate cancer in her father.  SOCIAL HISTORY:  reports that she quit smoking about 35 years ago. Her smoking use included cigarettes. She started smoking about 64 years ago. She has a 43.5 pack-year smoking history. She has never used smokeless tobacco. She reports current alcohol use of about 7.0 standard drinks of alcohol per week. She reports that she does not use drugs.  ALLERGIES: Simvastatin   MEDICATIONS:  Current Outpatient Medications  Medication Sig Dispense Refill   acetaminophen  (TYLENOL )  500 MG tablet Take 500 mg by mouth every 6 (six) hours as needed.     arformoterol  (BROVANA ) 15 MCG/2ML NEBU Inhale 2  mLs (15 mcg total) by nebulization 2 (two) times daily. 120 mL 10   atorvastatin  (LIPITOR) 20 MG tablet Take 1 tablet (20 mg total) by mouth every other day. 45 tablet 3   bisoprolol  (ZEBETA ) 5 MG tablet Take 0.5 tablets (2.5 mg total) by mouth daily. 45 tablet 1   budesonide  (PULMICORT ) 0.5 MG/2ML nebulizer solution USE 1 VIAL VIA NEBULIZER TWICE DAILY 120 mL 11   cetirizine  (ZYRTEC ) 10 MG tablet Take 1 tablet (10 mg total) by mouth daily. 90 tablet 1   escitalopram  (LEXAPRO ) 20 MG tablet Take 1 tablet (20 mg total) by mouth daily. 90 tablet 1   folic acid  (FOLVITE ) 1 MG tablet Take 1 tablet (1 mg total) by mouth daily. 90 tablet 3   furosemide  (LASIX ) 20 MG tablet Take 1 tablet (20 mg total) by mouth daily. TAKE 1 EXTRA TABLET EVERY EVENING FOR SWELLING. 90 tablet 2   levothyroxine  (SYNTHROID ) 50 MCG tablet Take 1 tablet (50 mcg total) by mouth daily at 6 (six) AM. 90 tablet 1   MULTIPLE VITAMIN PO Take 1 tablet by mouth daily.      OXYGEN  Inhale 2.5 L daily into the lungs.     pantoprazole  (PROTONIX ) 40 MG tablet Take 1 tablet (40 mg total) by mouth daily. 90 tablet 1   roflumilast (DALIRESP) 500 MCG TABS tablet Take 0.5 tablets (250 mcg total) by mouth daily. 1/2 tablet daily 30 tablet 1   RSV vaccine recomb adjuvanted (AREXVY) 120 MCG/0.5ML injection Inject into the muscle. 0.5 mL 0   No current facility-administered medications for this encounter.    ECOG PERFORMANCE STATUS:  0 - Asymptomatic  REVIEW OF SYSTEMS: Patient denies any weight loss, fatigue, weakness, fever, chills or night sweats. Patient denies any loss of vision, blurred vision. Patient denies any ringing  of the ears or hearing loss. No irregular heartbeat. Patient denies heart murmur or history of fainting. Patient denies any chest pain or pain radiating to her upper extremities. Patient denies any shortness of breath, difficulty breathing at night, cough or hemoptysis. Patient denies any swelling in the lower legs. Patient  denies any nausea vomiting, vomiting of blood, or coffee ground material in the vomitus. Patient denies any stomach pain. Patient states has had normal bowel movements no significant constipation or diarrhea. Patient denies any dysuria, hematuria or significant nocturia. Patient denies any problems walking, swelling in the joints or loss of balance. Patient denies any skin changes, loss of hair or loss of weight. Patient denies any excessive worrying or anxiety or significant depression. Patient denies any problems with insomnia. Patient denies excessive thirst, polyuria, polydipsia. Patient denies any swollen glands, patient denies easy bruising or easy bleeding. Patient denies any recent infections, allergies or URI. Patient s visual fields have not changed significantly in recent time.   PHYSICAL EXAM: BP (!) 141/61   Pulse 73   Ht 5' 4 (1.626 m)   Wt 126 lb (57.2 kg)   BMI 21.63 kg/m  Elderly female in NAD.  Patient is hard of hearing.  Well-developed well-nourished patient in NAD. HEENT reveals PERLA, EOMI, discs not visualized.  Oral cavity is clear. No oral mucosal lesions are identified. Neck is clear without evidence of cervical or supraclavicular adenopathy. Lungs are clear to A&P. Cardiac examination is essentially unremarkable with regular rate and rhythm without murmur rub  or thrill. Abdomen is benign with no organomegaly or masses noted. Motor sensory and DTR levels are equal and symmetric in the upper and lower extremities. Cranial nerves II through XII are grossly intact. Proprioception is intact. No peripheral adenopathy or edema is identified. No motor or sensory levels are noted. Crude visual fields are within normal range.  LABORATORY DATA: Labs reviewed    RADIOLOGY RESULTS: CT scan and PET CT scan reviewed compatible with above-stated findings   IMPRESSION: Stage Ia non-small cell lung cancer left lower lobe in 89 year old female  PLAN: At this time I have recommended SBRT  to her left lower lobe lesion.  Would plan on delivering 55 Gray in 5 fractions.  Risks and benefits of treatment including extremely low side effect profile possibly developing a slight cough after treatment possible fatigue all were explained in detail to the patient and her daughter.  They both comprehend my treatment plan well.  I personally set up and ordered CT simulation for right after the Thanksgiving holiday.  I would like to take this opportunity to thank you for allowing me to participate in the care of your patient.SABRA Marcey Penton, MD

## 2024-03-12 ENCOUNTER — Ambulatory Visit
Admission: RE | Admit: 2024-03-12 | Discharge: 2024-03-12 | Disposition: A | Payer: Medicare (Managed Care) | Source: Ambulatory Visit | Attending: Radiation Oncology | Admitting: Radiation Oncology

## 2024-03-12 DIAGNOSIS — C3432 Malignant neoplasm of lower lobe, left bronchus or lung: Secondary | ICD-10-CM | POA: Insufficient documentation

## 2024-03-12 DIAGNOSIS — Z510 Encounter for antineoplastic radiation therapy: Secondary | ICD-10-CM | POA: Diagnosis present

## 2024-03-14 DIAGNOSIS — Z510 Encounter for antineoplastic radiation therapy: Secondary | ICD-10-CM | POA: Diagnosis not present

## 2024-03-19 ENCOUNTER — Ambulatory Visit
Admission: RE | Admit: 2024-03-19 | Discharge: 2024-03-19 | Payer: Medicare (Managed Care) | Attending: Radiation Oncology | Admitting: Radiation Oncology

## 2024-03-19 ENCOUNTER — Other Ambulatory Visit: Payer: Self-pay

## 2024-03-19 DIAGNOSIS — Z510 Encounter for antineoplastic radiation therapy: Secondary | ICD-10-CM | POA: Diagnosis not present

## 2024-03-19 LAB — RAD ONC ARIA SESSION SUMMARY
Course Elapsed Days: 0
Plan Fractions Treated to Date: 1
Plan Prescribed Dose Per Fraction: 11 Gy
Plan Total Fractions Prescribed: 5
Plan Total Prescribed Dose: 55 Gy
Reference Point Dosage Given to Date: 11 Gy
Reference Point Session Dosage Given: 11 Gy
Session Number: 1

## 2024-03-20 ENCOUNTER — Other Ambulatory Visit: Payer: Self-pay | Admitting: Internal Medicine

## 2024-03-20 ENCOUNTER — Other Ambulatory Visit: Payer: Self-pay

## 2024-03-20 DIAGNOSIS — J9611 Chronic respiratory failure with hypoxia: Secondary | ICD-10-CM

## 2024-03-20 DIAGNOSIS — R0689 Other abnormalities of breathing: Secondary | ICD-10-CM

## 2024-03-20 DIAGNOSIS — J439 Emphysema, unspecified: Secondary | ICD-10-CM

## 2024-03-20 MED ORDER — ARFORMOTEROL TARTRATE 15 MCG/2ML IN NEBU
15.0000 ug | INHALATION_SOLUTION | Freq: Two times a day (BID) | RESPIRATORY_TRACT | 10 refills | Status: AC
  Filled 2024-03-20: qty 120, 30d supply, fill #0
  Filled 2024-04-23: qty 120, 30d supply, fill #1
  Filled 2024-05-16: qty 120, 30d supply, fill #2

## 2024-03-21 ENCOUNTER — Other Ambulatory Visit: Payer: Self-pay

## 2024-03-21 ENCOUNTER — Ambulatory Visit
Admission: RE | Admit: 2024-03-21 | Discharge: 2024-03-21 | Disposition: A | Discharge status: Home / Self Care | Payer: Medicare (Managed Care) | Source: Ambulatory Visit | Attending: Radiation Oncology | Admitting: Radiation Oncology

## 2024-03-21 DIAGNOSIS — Z510 Encounter for antineoplastic radiation therapy: Secondary | ICD-10-CM | POA: Diagnosis not present

## 2024-03-21 LAB — RAD ONC ARIA SESSION SUMMARY
Course Elapsed Days: 2
Plan Fractions Treated to Date: 2
Plan Prescribed Dose Per Fraction: 11 Gy
Plan Total Fractions Prescribed: 5
Plan Total Prescribed Dose: 55 Gy
Reference Point Dosage Given to Date: 22 Gy
Reference Point Session Dosage Given: 11 Gy
Session Number: 2

## 2024-03-26 ENCOUNTER — Ambulatory Visit
Admission: RE | Admit: 2024-03-26 | Discharge: 2024-03-26 | Disposition: A | Payer: Medicare (Managed Care) | Source: Ambulatory Visit | Attending: Radiation Oncology | Admitting: Radiation Oncology

## 2024-03-26 ENCOUNTER — Other Ambulatory Visit: Payer: Self-pay

## 2024-03-26 DIAGNOSIS — Z510 Encounter for antineoplastic radiation therapy: Secondary | ICD-10-CM | POA: Diagnosis not present

## 2024-03-26 LAB — RAD ONC ARIA SESSION SUMMARY
Course Elapsed Days: 7
Plan Fractions Treated to Date: 3
Plan Prescribed Dose Per Fraction: 11 Gy
Plan Total Fractions Prescribed: 5
Plan Total Prescribed Dose: 55 Gy
Reference Point Dosage Given to Date: 33 Gy
Reference Point Session Dosage Given: 11 Gy
Session Number: 3

## 2024-03-28 ENCOUNTER — Ambulatory Visit
Admission: RE | Admit: 2024-03-28 | Discharge: 2024-03-28 | Payer: Medicare (Managed Care) | Attending: Radiation Oncology | Admitting: Radiation Oncology

## 2024-03-28 ENCOUNTER — Other Ambulatory Visit: Payer: Self-pay

## 2024-03-28 DIAGNOSIS — Z510 Encounter for antineoplastic radiation therapy: Secondary | ICD-10-CM | POA: Diagnosis not present

## 2024-03-28 LAB — RAD ONC ARIA SESSION SUMMARY
Course Elapsed Days: 9
Plan Fractions Treated to Date: 4
Plan Prescribed Dose Per Fraction: 11 Gy
Plan Total Fractions Prescribed: 5
Plan Total Prescribed Dose: 55 Gy
Reference Point Dosage Given to Date: 44 Gy
Reference Point Session Dosage Given: 11 Gy
Session Number: 4

## 2024-04-02 ENCOUNTER — Other Ambulatory Visit: Payer: Self-pay

## 2024-04-02 ENCOUNTER — Ambulatory Visit
Admission: RE | Admit: 2024-04-02 | Discharge: 2024-04-02 | Disposition: A | Discharge status: Home / Self Care | Payer: Medicare (Managed Care) | Source: Ambulatory Visit | Attending: Radiation Oncology | Admitting: Radiation Oncology

## 2024-04-02 DIAGNOSIS — Z510 Encounter for antineoplastic radiation therapy: Secondary | ICD-10-CM | POA: Diagnosis not present

## 2024-04-02 LAB — RAD ONC ARIA SESSION SUMMARY
Course Elapsed Days: 14
Plan Fractions Treated to Date: 5
Plan Prescribed Dose Per Fraction: 11 Gy
Plan Total Fractions Prescribed: 5
Plan Total Prescribed Dose: 55 Gy
Reference Point Dosage Given to Date: 55 Gy
Reference Point Session Dosage Given: 11 Gy
Session Number: 5

## 2024-04-03 NOTE — Radiation Completion Notes (Signed)
 Patient Name: Mary Howe, TUZZOLINO MRN: [id number] Date of Birth: [birth year] Referring Physician: VERNEITA KETTERING, M.D. Date of Service: 2024-04-03 Radiation Oncologist: Marcey Penton, M.D. [facility] [place]                             RADIATION ONCOLOGY END OF TREATMENT NOTE     Diagnosis: R91.1 Solitary pulmonary nodule Intent: Curative     HPI: Patient is an 89 year old female with multiple medical comorbidities including significant COPD emphysema.  She has been followed by Dr. Isaiah for a progressive left lower lobe lesion.  It is recently grown on CT scan in October showing a now 1.7 cm subpleural nodule of the posterior left lower lobe concerning for malignancy.  She also has moderate emphysematous changes and mild bibasilar atelectasis.  PET scan was performed showing hypermetabolic left lower lobe nodule indicative of stage Ia primary bronchogenic carcinoma.  There is also some minimal hypermetabolic activity in the proximal right femur for which follow-up on examination was recommended.  Based on her age and overall general condition and medical comorbidities she is not a candidate for biopsy.  She is now referred to radiation oncology for consideration of SBRT treatment.  She is having no significant cough hemoptysis or chest tightness at this time.  Patient is significantly hard of hearing although accompanied by her daughter.      ==========DELIVERED PLANS==========  First Treatment Date: 2024-03-19 Last Treatment Date: 2024-04-02   Plan Name: Lung_L_SBRT Site: Lung, Left Technique: SBRT/SRT-IMRT Mode: Photon Dose Per Fraction: 11 Gy Prescribed Dose (Delivered / Prescribed): 55 Gy / 55 Gy Prescribed Fxs (Delivered / Prescribed): 5 / 5     ==========ON TREATMENT VISIT DATES========== 2024-03-19, 2024-03-21, 2024-03-26, 2024-03-26, 2024-03-28, 2024-04-02     ==========UPCOMING VISITS========== 05/17/2024 [facility] OFFICE VISIT [place] Verneita CROME,  MD  05/08/2024 [facility] OFFICE VISIT - [place] Isaiah Scrivener, MD  05/06/2024 [facility] RAD ONCOLOGY FOLLOW UP 30 Penton Marcey, MD        ==========APPENDIX - ON TREATMENT VISIT NOTES==========   See weekly On Treatment Notes in Epic for details in the Media tab (listed as Progress notes on the On Treatment Visit Dates listed above).

## 2024-04-23 ENCOUNTER — Other Ambulatory Visit: Payer: Self-pay

## 2024-05-06 ENCOUNTER — Ambulatory Visit: Payer: Medicare (Managed Care) | Admitting: Radiation Oncology

## 2024-05-08 ENCOUNTER — Ambulatory Visit: Payer: Medicare (Managed Care) | Admitting: Internal Medicine

## 2024-05-09 ENCOUNTER — Ambulatory Visit: Payer: Medicare (Managed Care) | Admitting: Internal Medicine

## 2024-05-11 ENCOUNTER — Other Ambulatory Visit: Payer: Self-pay

## 2024-05-15 ENCOUNTER — Encounter: Payer: Self-pay | Admitting: Radiation Oncology

## 2024-05-15 ENCOUNTER — Ambulatory Visit
Admission: RE | Admit: 2024-05-15 | Discharge: 2024-05-15 | Disposition: A | Payer: Medicare (Managed Care) | Source: Ambulatory Visit | Attending: Radiation Oncology | Admitting: Radiation Oncology

## 2024-05-15 ENCOUNTER — Other Ambulatory Visit: Payer: Self-pay | Admitting: *Deleted

## 2024-05-15 VITALS — BP 116/67 | HR 67 | Temp 97.4°F | Resp 16 | Wt 131.0 lb

## 2024-05-15 DIAGNOSIS — R911 Solitary pulmonary nodule: Secondary | ICD-10-CM

## 2024-05-15 NOTE — Progress Notes (Signed)
 Radiation Oncology Follow up Note  Name: Mary Howe   Date:   05/15/2024 MRN:  [id number] DOB: 07/30/1932    This 89 y.o. female presents to the clinic today for 1 month follow-up status post SBRT for a probable stage I non-small cell lung cancer of the left lower lobe.  REFERRING PROVIDER: Marylynn Verneita CROME, MD  HPI: Patient is an 89 year old female now out 1 month having completed SBRT to her left lower lobe for a probable stage I non-small cell lung cancer.  Seen today in routine follow-up she is doing well.  She specifically denies any increased pulmonary complications cough hemoptysis or chest tightness..  COMPLICATIONS OF TREATMENT: none  FOLLOW UP COMPLIANCE: keeps appointments   PHYSICAL EXAM:  BP 116/67   Pulse 67   Temp (!) 97.4 F (36.3 C) (Tympanic)   Resp 16   Wt 131 lb (59.4 kg)   BMI 22.49 kg/m  Well-developed well-nourished patient in NAD. HEENT reveals PERLA, EOMI, discs not visualized.  Oral cavity is clear. No oral mucosal lesions are identified. Neck is clear without evidence of cervical or supraclavicular adenopathy. Lungs are clear to A&P. Cardiac examination is essentially unremarkable with regular rate and rhythm without murmur rub or thrill. Abdomen is benign with no organomegaly or masses noted. Motor sensory and DTR levels are equal and symmetric in the upper and lower extremities. Cranial nerves II through XII are grossly intact. Proprioception is intact. No peripheral adenopathy or edema is identified. No motor or sensory levels are noted. Crude visual fields are within normal range.  RADIOLOGY RESULTS: CT scan of the chest ordered in 3 months  PLAN: Present time patient is doing well with a low side effect profile from SBRT.  And pleased with her overall progress.  I have asked to see her back in 3 months for follow-up with a CT scan of her chest prior to that visit.  Patient knows to call sooner with any concerns.  I have asked her to make follow-up  appointments with Dr. Tawana of pulmonology in the future.  I would like to take this opportunity to thank you for allowing me to participate in the care of your patient.SABRA Marcey Penton, MD

## 2024-05-16 ENCOUNTER — Other Ambulatory Visit: Payer: Self-pay

## 2024-05-16 ENCOUNTER — Other Ambulatory Visit: Payer: Self-pay | Admitting: Internal Medicine

## 2024-05-16 ENCOUNTER — Encounter: Payer: Self-pay | Admitting: Radiation Oncology

## 2024-05-16 ENCOUNTER — Telehealth: Payer: Self-pay | Admitting: Radiation Oncology

## 2024-05-16 DIAGNOSIS — J439 Emphysema, unspecified: Secondary | ICD-10-CM

## 2024-05-16 MED ORDER — BUDESONIDE 0.5 MG/2ML IN SUSP
RESPIRATORY_TRACT | 11 refills | Status: AC
  Filled 2024-05-16: qty 120, fill #0

## 2024-05-16 NOTE — Telephone Encounter (Signed)
 Called pt to sched CT - pt confirmed date/time/location - requested appt reminder - LH

## 2024-05-17 ENCOUNTER — Ambulatory Visit: Payer: Medicare (Managed Care) | Admitting: Internal Medicine

## 2024-05-17 ENCOUNTER — Other Ambulatory Visit: Payer: Self-pay

## 2024-05-17 MED ORDER — BISOPROLOL FUMARATE 5 MG PO TABS
2.5000 mg | ORAL_TABLET | Freq: Every day | ORAL | 1 refills | Status: AC
  Filled 2024-05-17: qty 45, 90d supply, fill #0

## 2024-07-23 ENCOUNTER — Ambulatory Visit: Payer: Medicare (Managed Care) | Admitting: Internal Medicine

## 2024-07-29 ENCOUNTER — Ambulatory Visit: Payer: Medicare (Managed Care)

## 2024-08-12 ENCOUNTER — Ambulatory Visit: Payer: Medicare (Managed Care) | Admitting: Radiation Oncology
